# Patient Record
Sex: Male | Born: 1945 | Race: White | Hispanic: No | Smoking: Former smoker
Health system: Southern US, Community
[De-identification: ages and names within clinical notes are randomized; demographics above are authoritative.]

## PROBLEM LIST (undated history)

## (undated) DIAGNOSIS — E119 Type 2 diabetes mellitus without complications: Secondary | ICD-10-CM

## (undated) DIAGNOSIS — Z860100 Personal history of colon polyps, unspecified: Secondary | ICD-10-CM

## (undated) DIAGNOSIS — F419 Anxiety disorder, unspecified: Secondary | ICD-10-CM

## (undated) DIAGNOSIS — K2970 Gastritis, unspecified, without bleeding: Secondary | ICD-10-CM

## (undated) DIAGNOSIS — K2990 Gastroduodenitis, unspecified, without bleeding: Secondary | ICD-10-CM

## (undated) DIAGNOSIS — D649 Anemia, unspecified: Secondary | ICD-10-CM

## (undated) DIAGNOSIS — E871 Hypo-osmolality and hyponatremia: Secondary | ICD-10-CM

## (undated) DIAGNOSIS — K769 Liver disease, unspecified: Secondary | ICD-10-CM

## (undated) DIAGNOSIS — F32A Depression, unspecified: Secondary | ICD-10-CM

## (undated) DIAGNOSIS — K219 Gastro-esophageal reflux disease without esophagitis: Secondary | ICD-10-CM

## (undated) DIAGNOSIS — F329 Major depressive disorder, single episode, unspecified: Secondary | ICD-10-CM

## (undated) DIAGNOSIS — R739 Hyperglycemia, unspecified: Secondary | ICD-10-CM

## (undated) DIAGNOSIS — I10 Essential (primary) hypertension: Secondary | ICD-10-CM

## (undated) DIAGNOSIS — K589 Irritable bowel syndrome without diarrhea: Secondary | ICD-10-CM

## (undated) DIAGNOSIS — E785 Hyperlipidemia, unspecified: Secondary | ICD-10-CM

## (undated) DIAGNOSIS — M129 Arthropathy, unspecified: Secondary | ICD-10-CM

## (undated) HISTORY — DX: Major depressive disorder, single episode, unspecified: F32.9

## (undated) HISTORY — DX: Hyperglycemia, unspecified: R73.9

## (undated) HISTORY — DX: Hyperlipidemia, unspecified: E78.5

## (undated) HISTORY — DX: Hypo-osmolality and hyponatremia: E87.1

## (undated) HISTORY — PX: FRACTURE SURGERY: SHX138

## (undated) HISTORY — DX: Anxiety disorder, unspecified: F41.9

## (undated) HISTORY — DX: Gastroduodenitis, unspecified, without bleeding: K29.90

## (undated) HISTORY — DX: Arthropathy, unspecified: M12.9

## (undated) HISTORY — DX: Personal history of colonic polyps: Z86.010

## (undated) HISTORY — DX: Gastro-esophageal reflux disease without esophagitis: K21.9

## (undated) HISTORY — DX: Anemia, unspecified: D64.9

## (undated) HISTORY — DX: Irritable bowel syndrome, unspecified: K58.9

## (undated) HISTORY — DX: Type 2 diabetes mellitus without complications: E11.9

## (undated) HISTORY — DX: Personal history of colon polyps, unspecified: Z86.0100

## (undated) HISTORY — DX: Gastritis, unspecified, without bleeding: K29.70

## (undated) HISTORY — DX: Depression, unspecified: F32.A

## (undated) HISTORY — DX: Essential (primary) hypertension: I10

---
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
## 1999-04-16 ENCOUNTER — Emergency Department (HOSPITAL_COMMUNITY): Admission: EM | Admit: 1999-04-16 | Discharge: 1999-04-16 | Payer: Self-pay | Admitting: Emergency Medicine

## 2001-05-06 ENCOUNTER — Emergency Department (HOSPITAL_COMMUNITY): Admission: EM | Admit: 2001-05-06 | Discharge: 2001-05-06 | Payer: Self-pay | Admitting: *Deleted

## 2005-05-17 ENCOUNTER — Emergency Department (HOSPITAL_COMMUNITY): Admission: EM | Admit: 2005-05-17 | Discharge: 2005-05-17 | Payer: Self-pay | Admitting: Emergency Medicine

## 2007-10-25 ENCOUNTER — Emergency Department (HOSPITAL_COMMUNITY): Admission: EM | Admit: 2007-10-25 | Discharge: 2007-10-25 | Payer: Self-pay | Admitting: Emergency Medicine

## 2007-11-04 ENCOUNTER — Inpatient Hospital Stay (HOSPITAL_COMMUNITY): Admission: EM | Admit: 2007-11-04 | Discharge: 2007-11-15 | Payer: Self-pay | Admitting: Family Medicine

## 2008-02-29 ENCOUNTER — Emergency Department (HOSPITAL_COMMUNITY): Admission: EM | Admit: 2008-02-29 | Discharge: 2008-03-01 | Payer: Self-pay | Admitting: Emergency Medicine

## 2008-03-04 ENCOUNTER — Emergency Department (HOSPITAL_COMMUNITY): Admission: EM | Admit: 2008-03-04 | Discharge: 2008-03-04 | Payer: Self-pay | Admitting: Emergency Medicine

## 2009-09-25 ENCOUNTER — Emergency Department (HOSPITAL_COMMUNITY): Admission: EM | Admit: 2009-09-25 | Discharge: 2009-09-25 | Payer: Self-pay | Admitting: Family Medicine

## 2009-09-26 ENCOUNTER — Ambulatory Visit: Payer: Self-pay | Admitting: Internal Medicine

## 2009-09-26 ENCOUNTER — Observation Stay (HOSPITAL_COMMUNITY): Admission: EM | Admit: 2009-09-26 | Discharge: 2009-09-29 | Payer: Self-pay | Admitting: Emergency Medicine

## 2009-10-23 ENCOUNTER — Ambulatory Visit (HOSPITAL_COMMUNITY): Admission: RE | Admit: 2009-10-23 | Discharge: 2009-10-23 | Payer: Self-pay | Admitting: Gastroenterology

## 2009-10-23 ENCOUNTER — Encounter (INDEPENDENT_AMBULATORY_CARE_PROVIDER_SITE_OTHER): Payer: Self-pay | Admitting: Gastroenterology

## 2009-12-13 LAB — HM DIABETES EYE EXAM

## 2009-12-13 LAB — HM COLONOSCOPY

## 2011-01-12 ENCOUNTER — Emergency Department (HOSPITAL_COMMUNITY)
Admission: EM | Admit: 2011-01-12 | Discharge: 2011-01-12 | Payer: Self-pay | Source: Home / Self Care | Admitting: Emergency Medicine

## 2011-01-31 ENCOUNTER — Inpatient Hospital Stay (INDEPENDENT_AMBULATORY_CARE_PROVIDER_SITE_OTHER)
Admission: RE | Admit: 2011-01-31 | Discharge: 2011-01-31 | Disposition: A | Discharge status: Home / Self Care | Payer: Medicare Other | Source: Ambulatory Visit | Attending: Emergency Medicine | Admitting: Emergency Medicine

## 2011-01-31 DIAGNOSIS — S0990XA Unspecified injury of head, initial encounter: Secondary | ICD-10-CM

## 2011-03-17 LAB — GLUCOSE, CAPILLARY
Glucose-Capillary: 167 mg/dL — ABNORMAL HIGH (ref 70–99)
Glucose-Capillary: 223 mg/dL — ABNORMAL HIGH (ref 70–99)

## 2011-03-18 LAB — DIFFERENTIAL
Eosinophils Relative: 2 % (ref 0–5)
Lymphocytes Relative: 30 % (ref 12–46)
Lymphs Abs: 1 10*3/uL (ref 0.7–4.0)
Monocytes Absolute: 0.3 10*3/uL (ref 0.1–1.0)
Monocytes Relative: 8 % (ref 3–12)

## 2011-03-18 LAB — BASIC METABOLIC PANEL
BUN: 16 mg/dL (ref 6–23)
BUN: 17 mg/dL (ref 6–23)
CO2: 25 mEq/L (ref 19–32)
Calcium: 8.7 mg/dL (ref 8.4–10.5)
Chloride: 103 mEq/L (ref 96–112)
Chloride: 104 mEq/L (ref 96–112)
GFR calc Af Amer: >60 mL/min (ref >60)
Glucose, Bld: 136 mg/dL — ABNORMAL HIGH (ref 70–99)
Glucose, Bld: 173 mg/dL — ABNORMAL HIGH (ref 70–99)
Potassium: 3.9 mEq/L (ref 3.5–5.1)
Sodium: 139 mEq/L (ref 135–145)

## 2011-03-18 LAB — GLUCOSE, CAPILLARY
Glucose-Capillary: 137 mg/dL — ABNORMAL HIGH (ref 70–99)
Glucose-Capillary: 159 mg/dL — ABNORMAL HIGH (ref 70–99)
Glucose-Capillary: 93 mg/dL (ref 70–99)
Glucose-Capillary: 131 mg/dL — ABNORMAL HIGH (ref 70–99)
Glucose-Capillary: 77 mg/dL (ref 70–99)

## 2011-03-18 LAB — CBC
HCT: 37.5 % — ABNORMAL LOW (ref 39.0–52.0)
HCT: 36.8 % — ABNORMAL LOW (ref 39.0–52.0)
Hemoglobin: 12.9 g/dL — ABNORMAL LOW (ref 13.0–17.0)
Hemoglobin: 12.3 g/dL — ABNORMAL LOW (ref 13.0–17.0)
Hemoglobin: 12.7 g/dL — ABNORMAL LOW (ref 13.0–17.0)
MCHC: 34.5 g/dL (ref 30.0–36.0)
MCV: 92.8 fL (ref 78.0–100.0)
MCV: 92.6 fL (ref 78.0–100.0)
Platelets: 68 10*3/uL — ABNORMAL LOW (ref 150–400)
Platelets: 73 10*3/uL — ABNORMAL LOW (ref 150–400)
RBC: 4.04 MIL/uL — ABNORMAL LOW (ref 4.22–5.81)
RBC: 3.84 MIL/uL — ABNORMAL LOW (ref 4.22–5.81)
RDW: 13.6 % (ref 11.5–15.5)
RDW: 13.3 % (ref 11.5–15.5)
WBC: 5.5 10*3/uL (ref 4.0–10.5)
WBC: 4.5 10*3/uL (ref 4.0–10.5)

## 2011-03-18 LAB — CARDIAC PANEL(CRET KIN+CKTOT+MB+TROPI)
CK, MB: 1 ng/mL (ref 0.3–4.0)
Relative Index: INVALID (ref 0.0–2.5)
Total CK: 79 U/L (ref 7–232)
Troponin I: 0.01 ng/mL (ref 0.00–0.06)
Troponin I: 0.01 ng/mL (ref 0.00–0.06)

## 2011-03-18 LAB — HEPATIC FUNCTION PANEL
ALT: 20 U/L (ref 0–53)
AST: 32 U/L (ref 0–37)
Alkaline Phosphatase: 51 U/L (ref 39–117)
Indirect Bilirubin: 0.5 mg/dL (ref 0.3–0.9)
Total Protein: 7.6 g/dL (ref 6.0–8.3)

## 2011-03-18 LAB — POCT CARDIAC MARKERS
CKMB, poc: <1 ng/mL — ABNORMAL LOW (ref 1.0–8.0)
CKMB, poc: <1 ng/mL — ABNORMAL LOW (ref 1.0–8.0)
Myoglobin, poc: 82.8 ng/mL (ref 12–200)
Troponin i, poc: <0.05 ng/mL (ref 0.00–0.09)

## 2011-03-18 LAB — RETICULOCYTES: Retic Count, Absolute: 41.3 10*3/uL (ref 19.0–186.0)

## 2011-03-18 LAB — CARBAMAZEPINE, FREE AND TOTAL

## 2011-04-27 NOTE — Discharge Summary (Signed)
NAME:  [name], [name]               ACCOUNT NO.:  [id number]   MEDICAL RECORD NO.:  [id number]          PATIENT TYPE:  INP   LOCATION:  6743                         FACILITY:  [facility]   PHYSICIAN:  Hind I Elsaid, MD      DATE OF BIRTH:  [birth year]   DATE OF ADMISSION:  11/04/2007  DATE OF DISCHARGE:  11/14/2007                               DISCHARGE SUMMARY   DISCHARGE DIAGNOSES:  Discharge diagnoses remain the same as dictated by  Dr. Jamison Oka on November 08, 2007.   DISCHARGE MEDICATIONS:  1. Protonix 40 mg p.o. daily.  2. Remeron 15 mg p.o. q.h.s.  3. Zocor 40 mg p.o. q.h.s.  4. Aspirin 81 mg p.o. daily.  5. Lisinopril 5 mg p.o. daily.  6. Geodon 80 mg p.o. daily.  7. Cipro 500 mg p.o. q.12h. for another 2 weeks.  8. Metformin 1000 mg p.o. b.i.d.  9. Novolin insulin NPH, 22 units in the morning and 17 units at      bedtime.  10.Darvocet 1-2 tablets p.o. q.4-6h. p.r.n. for pain.  11.Hydroxyzine 50 mg p.o. daily.  12.Ativan 0.5 mg p.o. q.8h. p.r.n.  13.Wound dressing which is mainly wound care which involves packing      and application of the wound with sterile water 2 times a day.      Please replace packing using 1-inch idoform gauze.  Reapply dry      gauze over the wound and use a support garment.   HOSPITAL COURSE:  Covering December 1 and November 14, 2007.  The patient  remained afebrile.  Wound remained sterile, showing decreased erythema  and discharge.  Plan is for this patient to continue Cipro p.o. for a  total of 3 weeks.  The patient has to follow up with Dr. Marcelyn Bruins  from [facility] within 1 week for further evaluation of the  scrotum abscess.  The patient may need further imaging study after  resolution of infection.  Further recommendations regarding followup of  the epididymis and the testicle depend on the outpatient followup with  Dr. Marcelyn Bruins.   Also during hospitalization, the patient developed an episode of  hypoglycemia.  The  patient's Novolin NPH was decreased to 22 units subcu  in the morning and 17 units at evening.  Also, glipizide was stopped.  Further recommendations to increase his hyperglycemic agents to be  addressed as an outpatient.      Hind Bosie Helper, MD  Electronically Signed     HIE/MEDQ  D:  11/14/2007  T:  11/14/2007  Job:  [id number]

## 2011-04-27 NOTE — Discharge Summary (Signed)
NAME:  [name], [name]               ACCOUNT NO.:  [id number]   MEDICAL RECORD NO.:  [id number]          PATIENT TYPE:  INP   LOCATION:  6743                         FACILITY:  [facility]   PHYSICIAN:  Mobolaji B. Bakare, M.D.DATE OF BIRTH:  September 13, 1946   DATE OF ADMISSION:  11/04/2007  DATE OF DISCHARGE:                               DISCHARGE SUMMARY   INTERIM DISCHARGE SUMMARY:   PRIMARY CARE PHYSICIAN:  Unassigned.  The patient attends [employer] in  [place].   DATE OF DISCHARGE:  Pending.   FINAL DIAGNOSES:  1. Scrotal cellulitis and abscess.  2. Uncontrolled diabetes mellitus.  3. Normocytic anemia.  4. Mild thrombocytopenia.  5. Post-traumatic stress disorder.  6. Hypertension.  7. Hyperlipidemia.  8. Irritable bowel syndrome.  9. Anxiety.   PROCEDURES:  1. Ultrasound of the scrotum done on November 06, 2007 showed abnormal      density within the right scrotum raising the question of deformed      right testicle with innumerable microcalcifications.  Malignancy      could not be excluded.  The left testicle is normal.  There was      suspicion for left epididymitis.  2. CT scan of the pelvis with contrast showed scrotal swelling and      inhomogeneity suggesting cellulitis or inflammatory change.  3. Chest x-ray post PICC line insertion showed appropriate placement      in the mid SVC.   CONSULTANTS:  Urology consult provided by Jamison Neighbor, M.D.   BRIEF HISTORY:  Please refer to the admission H&P.  In brief, [name]  is a 65 year old Caucasian male with history of diabetes mellitus and  PTSD who lives independently at home.  He presented on the day of  admission with pain in the scrotum.  It was associated with fever,  chills, nausea, and diarrhea.  Prior to hospitalization, the patient was  seen at the urgent care and placed on oral antibiotic.  He also reported  drainage of pus from the left scrotum 4 days prior to hospitalization.  On initial evaluation, there was  erythema involving the whole scrotum.  There was a black eschar at the bottom of the scrotum.  No appreciable  collection.  The patient was admitted with scrotal cellulitis with  possible abscess.  The patient was admitted for further treatment and  evaluation.  He was started on broad-spectrum antibiotics with  vancomycin, ciprofloxacin, and gentamicin.   HOSPITAL COURSE:  Problem1.  Scrotal cellulitis and abscess.  The  patient was placed on IV broad-spectrum antibiotics with vancomycin,  gentamicin, and Cipro to cover MRSA and gram-negative organisms.  CT  scan of the scrotum was ordered.  Results are as noted above.  The CT  was unhelpful, hence ultrasound of the scrotum was obtained.  This  indicated abnormal density within the right scrotum raising the question  of deformed right testicle with enumerable calcifications.  Malignancy  could not be excluded.  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  Urology was consulted.  The patient  underwent an incision and  drainage by the bedside through the area of  dark eschar.  About 200 mL of green pus was obtained, resulting in a  large open abscess cavity.  There was no culture obtained from this pus.  Hence, the patient was continued on empiric treatment with antibiotics.  He has completed a 5-day course of triple antibiotics, as mentioned  above.  Blood culture came back negative.  Hence, vancomycin and  gentamicin were discontinued.  He was continued on ciprofloxacin p.o.  The patient remained afebrile.  White cell count has normalized.  He is  receiving hydrotherapy and wound dressing.  We will continue antibiotics  to complete ciprofloxacin for a total of 2 weeks.  With regards to the  irregularity on ultrasound, the patient will need a followup ultrasound  after the infection settles.  He will need to follow up with Dr. Logan Bores  in the outpatient setting.   Problem 2.  Diabetes mellitus.  The  patient has type 2 diabetes.  He has  been on metformin, glipizide, and NovoLog N.  During the initial course  of hospitalization, blood glucose was uncontrolled.  He was restarted on  his home dose of metformin, glipizide and NovoLog N and also placed on  sliding scale insulin for correctional coverage.  Hemoglobin A1c was  12.4, indicating poor control previously.  The patient's blood glucose  on this current regimen has been fairly controlled, with fasting blood  glucose on the day of dictation at 104.  Further adjustment will be made  depending on response to current medications.   Problem 3.  Normocytic anemia.  The patient was admitted with a  hemoglobin of 12.7, and this dropped to about 10.  Anemia panel was  suggestive of anemia of chronic disease.  This again may be  hemodilutional.  Currently, hemoglobin is ranging between 10 and 11.  Stool Hemoccult is negative.   Problem 4.  Mild thrombocytopenia.  The patient was admitted with a  platelet of 128.  This is felt to be secondary to the infection.  Platelets at the time of dictation were 144.   DISPOSITION:  The patient's family stated that he has been neglecting  himself, and it appears he that he cannot take care of his medications  appropriately at home.  Arrangement is underway for an assisted living  facility.   Addendum will be made to this dictation with discharge medications and  instructions.      Mobolaji B. Corky Downs, M.D.  Electronically Signed     MBB/MEDQ  D:  11/10/2007  T:  11/10/2007  Job:  [id number]   cc:   Jamison Neighbor, M.D.

## 2011-04-27 NOTE — Consult Note (Signed)
[name], [name]               ACCOUNT NO.:  [id number]   MEDICAL RECORD NO.:  [id number]          PATIENT TYPE:  INP   LOCATION:  6743                         FACILITY:  [facility]   PHYSICIAN:  Jamison Neighbor, M.D.  DATE OF BIRTH:  [birth year]   DATE OF CONSULTATION:  DATE OF DISCHARGE:                                 CONSULTATION   SERVICE:  Urology.   CONSULTING PHYSICIAN:  Dr. Lavera Guise   REASON FOR CONSULTATION:  1. Scrotal abscess.  2. Left epididymal orchitis.  3. Irregular right testicle.   HISTORY:  This is a 65 year old white male who was admitted to the  hospital on November 04, 2007, for treatment of what was described as  pain in the scrotum.  The patient is normally followed at the [facility] in [place] but presented 2 days prior to admission to urgent  care and was given oral antibiotic therapy.  At the time of his  admission the patient said that he had been having problem for about a  week, but when I pressed he said that he had been having some problems  down the left side for several months.  The patient did not respond to  antibiotic therapy.  His pain worsened.  He began to develop fevers,  chills riders and nausea.  He presented the emergency room.  He had some  problems with pus draining from the scrotum but he stated that the  drainage had stopped.  He ended up being admitted for treatment of what  appeared to be scrotal abscess.   While in the hospital the patient had a CT scan which suggested that he  had a right testicular remnant.  The patient claims that his right  testicle had been removed but on the imaging studies it looks as if  there was a right testicle present.  It had significant irregularity  with calcifications noted.  They question whether perhaps the patient's  testicle was still intact but that he had an epididymectomy.  The left  testicle itself appeared normal but the epididymis is enlarged.  The  patient has been started on a  combination of gentamicin, Cipro and  vancomycin.  The CT scan does not suggest that there is any gas within  the wound so does not appear to be Fournier's gangrene.  Urologic  consultation is sought to determine appropriate treatment for this  patient.   PAST MEDICAL HISTORY:  Remarkable for diabetes, hypertension and  elevated cholesterol.  He has a distant history of irritable bowel  syndrome and suffers from post-traumatic stress disorder.   SURGICAL HISTORY:  Is a little complicated in that he does think he had  an orchiectomy but based on imaging studies it was suggested that is not  the case.   MEDICATIONS:  At the time of admission, included aspirin, Flexeril,  gemfibrozil, glipizide, hydroxyzine, insulin, lisinopril, lorazepam,  metformin, minocycline, Remeron, simvastatin, and Geodon.   Review of the patient's current medications note that he is on Protonix,  Remeron, insulin, Geodon, as well as the antibiotic therapy described  above.   FAMILY  HISTORY, SOCIAL HISTORY, AND REVIEW OF SYMPTOMS:  Are not  contributory.  We did specifically question if he had had any GI  problems.  He said he had had a little blood in his stool and had some  diarrhea.  He denies any previous urologic history or previous  instrumentation.   EXAMINATION:  The patient is a well-developed, well-nourished, and  friendly male.  He says that he had been having some bloody drainage but  no pus drainage from the area.  HEENT exam was pertinent for a rash but  otherwise was unremarkable.  He had no flank mass or tenderness.  The  abdomen is soft, nontender with no palpable masses, rebound or guarding.  On GU exam I saw black eschar at the base of the scrotum and I was able  to lift this off and drain out 200-250 mL of copious green pus.  I then  was able to explore that area and there is a significant abscess cavity.  It does not appear to track back towards the rectum so I do not think  there is a  rectal source and I could not palpate any irregularities  along the course of the urethra.  He has a Foley catheter in place.  The  cavity does not really extend up to the right testicle and left testicle  was really nonpalpable because of the swelling.   I have reviewed the imaging studies.  The CT scan that is noted above  does show that there is no evidence of gas within the wound.  The  patient has no evidence of adenopathy or free fluid and the CT scan was  otherwise normal.  The ultrasound study as noted above shows an abnormal  density in the right scrotum suggesting he has a somewhat deformed right  testicle and numerous microcalcifications were identified.  The  radiologist said they could rule out malignancy, but of course at age 76  malignancy is relatively uncommon.  We do note, however, that in the age  group of 45-70 there is an increase in spermatocytic seminoma so that  would have to be considered.  The left testicle itself was normal but  the patient has clear-cut left epididymitis as well as the abscess.   RECOMMENDATIONS:  1. Continue the current antibiotic regimen, it appears to be      excellent.  2. The patient needs to have saline wet-to-dry packing of the wound      and it needs to be a very deep and vigorous packing three times a      day.  3. The patient should undergo whirlpool treatments once daily.  4. After the patient has stopped draining pus and the wound is      stabilized he can go home on oral antibiotics.  He is likely to      require these for quite some time.  I would anticipate 3-4 weeks of      antibiotic therapy would be appropriate.  5. Following completion of antibiotic therapy and normalization, I      would like to repeat his imaging studies.  In the left epididymis      has resolved and that left testicle looks fine I will leave that      alone.  On the right-hand side, if problems are still noted in that      right testicle, the best  treatment certainly might be to remove      this.  The  patient would need to have this done with an inguinal      incision and I would not want to do that at this time until the      abscess has been controlled as I would have fear that the infection      with spread up into that inguinal incision.   We will follow this patient during his hospital stay.      Jamison Neighbor, M.D.  Electronically Signed     RJE/MEDQ  D:  11/08/2007  T:  11/08/2007  Job:  [id number]

## 2011-04-27 NOTE — H&P (Signed)
NAMEJOHNTAVIOUS, FRANCOM               ACCOUNT NO.:  [id number]   MEDICAL RECORD NO.:  [id number]          PATIENT TYPE:  INP   LOCATION:  6743                         FACILITY:  [facility]   PHYSICIAN:  Lonia Blood, M.D.       DATE OF BIRTH:  [birth year]   DATE OF ADMISSION:  11/04/2007  DATE OF DISCHARGE:                              HISTORY & PHYSICAL   PRIMARY CARE PHYSICIAN:  [facility] in [place].   CHIEF COMPLAINT:  Pain in the scrotum.   HISTORY OF PRESENT ILLNESS:  Mr. Medlen is a 65 year old gentleman who  started experiencing about a week ago pain in his scrotum.  He presented  2 days prior to admission to the urgent care and was placed on oral  antibiotics.  His pain worsened and he started having fevers, chills,  nausea, and diarrhea, and presented back to the emergency room.  The  patient also reports that about 4 days prior to admission he had copious  pus draining from his scrotum, but that now the drainage has stopped.  He reports that also he is status post remote orchiectomy.   PAST MEDICAL HISTORY:  1. Diabetes.  2. Hypertension.  3. Hyperlipidemia.  4. Anxiety.  5. Post traumatic stress disorder.  6. Irritable bowel syndrome.  7. Hemorrhoids.   FAMILY HISTORY:  Both of the patient's parents are deceased.  He has a  sister who is alive and healthy.  He does not have any living children.   SOCIAL HISTORY:  The patient lives alone.  He denies smoking cigarettes.  Does not drink alcohol.   ALLERGIES:  PENICILLIN.   HOME MEDICATIONS:  Aspirin, Flexeril, gemfibrozil, glipizide,  hydroxyzine, insulin MPH 22 units in the morning and 17 units in the  evening, lisinopril, lorazepam, metformin, minocycline, Remeron,  simvastatin, and Geodon.  Unfortunately, the patient does not know the  doses of these medications.   REVIEW OF SYSTEMS:  Positive for diarrhea.  Positive for a rash on the  face that appeared 2 weeks prior to admission.  Positive for  some blood  in the stool.  The patient was trying to get evaluated through the [facility].   PHYSICAL EXAMINATION UPON ADMISSION:  VITAL SIGNS:  Shows a temperature  of temperature of 99.5, pulse of 119, respirations 12, blood pressure is  152/87, saturation is 99% on room air.  GENERAL APPEARANCE:  An anxious alert gentleman in some moderate  distress due to pain.  He is lying on the stretcher.  He is oriented to  place, person, and time.  HEAD:  His face has a macular rash that is widespread on the face.  No  declamation and no vesicular formation.  EYES:  Pupils equal and round, reactive to light and accommodation.  Extraocular movements intact.  THROAT:  Clear.  NECK:  Supple.  No JVD.  CHEST:  Clear to auscultation bilaterally without wheezes, rhonchi, or  crackles.  ABDOMEN:  Soft.  There is some mild suprapubic tenderness and I can  palpate a little bit of a mass suprapubically.  Bowel sounds are  present.  GENITOURINARY EXAM:  Shows an erythema on the scrotum.  There is a black  eschar at the bottom of the scrotum.  No appreciable collection.  EXTREMITIES:  Have no edema.  There are no changes on the skin on both  thighs bilaterally.  NEUROLOGICAL EXAM:  Nonfocal.   LABORATORY EVALUATION ON ADMISSION:  White blood cell count is 6.9,  hemoglobin 12.7, platelet count is 128,000.  Sodium is 128, potassium  3.3, chloride 94, bicarbonate 23, BUN 10, creatinine 1, glucose 389,  albumin is 2.7.   IMPRESSION:  1. Scrotal abscess:  This well could be methicillin-resistant      Staphylococcus aureus given also the patient's concomitant rash and      diarrhea which would suggest like a toxin producing strain.  I      would admit the patient to the acute care unit.  Obtain a CT scan      of the scrotum to rule out transaminases.  Treat the patient with      intravenous vancomycin, intravenous ciprofloxacin, intravenous      gentamicin given the fact that he is penicillin allergic.       Neurological consultation has been obtained through the emergency      room physician.  2. Uncontrolled diabetes type 2:  Mr. Eoff will be placed on a      diabetic diet.  He will have insulin NPH twice a day as well as      sliding scale insulin with meals.  Further adjustments will be done      based on the 24-hour CBG measurements.  3. Anemia with gastrointestinal blood loss:  Mr. Hausman will be      observed off aspirin for now.  He will be seen in consultation by      gastroenterology when his problem #1 resolves.  4. Hypokalemia:  Unclear etiology.  The patient is on a lot of      medications.  He may have forgotten to mention a diuretic.  The      potassium will be repleted intravenously.  5. Protein calorie gram nutrition:  Protein supplements orally will be      given to the patient.      Lonia Blood, M.D.  Electronically Signed     SL/MEDQ  D:  11/04/2007  T:  11/05/2007  Job:  [id number]   cc:   Renne Musca

## 2011-07-06 ENCOUNTER — Inpatient Hospital Stay (HOSPITAL_COMMUNITY)
Admission: EM | Admit: 2011-07-06 | Discharge: 2011-07-12 | DRG: 571 | Disposition: A | Discharge status: Home Health | Payer: Medicare Other | Attending: Internal Medicine | Admitting: Internal Medicine

## 2011-07-06 ENCOUNTER — Inpatient Hospital Stay (INDEPENDENT_AMBULATORY_CARE_PROVIDER_SITE_OTHER)
Admission: RE | Admit: 2011-07-06 | Discharge: 2011-07-06 | Disposition: A | Discharge status: Home / Self Care | Payer: Medicare Other | Source: Ambulatory Visit | Attending: Emergency Medicine | Admitting: Emergency Medicine

## 2011-07-06 DIAGNOSIS — K589 Irritable bowel syndrome without diarrhea: Secondary | ICD-10-CM | POA: Diagnosis present

## 2011-07-06 DIAGNOSIS — L03221 Cellulitis of neck: Secondary | ICD-10-CM

## 2011-07-06 DIAGNOSIS — IMO0001 Reserved for inherently not codable concepts without codable children: Secondary | ICD-10-CM | POA: Diagnosis present

## 2011-07-06 DIAGNOSIS — E871 Hypo-osmolality and hyponatremia: Secondary | ICD-10-CM | POA: Diagnosis present

## 2011-07-06 DIAGNOSIS — Z79899 Other long term (current) drug therapy: Secondary | ICD-10-CM

## 2011-07-06 DIAGNOSIS — D649 Anemia, unspecified: Secondary | ICD-10-CM | POA: Diagnosis present

## 2011-07-06 DIAGNOSIS — I10 Essential (primary) hypertension: Secondary | ICD-10-CM | POA: Diagnosis present

## 2011-07-06 DIAGNOSIS — Z7982 Long term (current) use of aspirin: Secondary | ICD-10-CM

## 2011-07-06 DIAGNOSIS — D72829 Elevated white blood cell count, unspecified: Secondary | ICD-10-CM | POA: Diagnosis present

## 2011-07-06 DIAGNOSIS — L0211 Cutaneous abscess of neck: Secondary | ICD-10-CM

## 2011-07-06 DIAGNOSIS — A4901 Methicillin susceptible Staphylococcus aureus infection, unspecified site: Secondary | ICD-10-CM | POA: Diagnosis present

## 2011-07-06 DIAGNOSIS — L732 Hidradenitis suppurativa: Secondary | ICD-10-CM | POA: Diagnosis present

## 2011-07-06 DIAGNOSIS — F411 Generalized anxiety disorder: Secondary | ICD-10-CM | POA: Diagnosis present

## 2011-07-06 DIAGNOSIS — E785 Hyperlipidemia, unspecified: Secondary | ICD-10-CM | POA: Diagnosis present

## 2011-07-06 DIAGNOSIS — B999 Unspecified infectious disease: Secondary | ICD-10-CM

## 2011-07-06 LAB — GLUCOSE, CAPILLARY: Glucose-Capillary: 397 mg/dL — ABNORMAL HIGH (ref 70–99)

## 2011-07-07 LAB — GLUCOSE, CAPILLARY
Glucose-Capillary: 339 mg/dL — ABNORMAL HIGH (ref 70–99)
Glucose-Capillary: 311 mg/dL — ABNORMAL HIGH (ref 70–99)

## 2011-07-07 LAB — BASIC METABOLIC PANEL
BUN: 19 mg/dL (ref 6–23)
CO2: 25 mEq/L (ref 19–32)
CO2: 26 mEq/L (ref 19–32)
Calcium: 8.4 mg/dL (ref 8.4–10.5)
Chloride: 90 mEq/L — ABNORMAL LOW (ref 96–112)
Chloride: 93 mEq/L — ABNORMAL LOW (ref 96–112)
Creatinine, Ser: 0.88 mg/dL (ref 0.50–1.35)
Creatinine, Ser: 0.8 mg/dL (ref 0.50–1.35)
GFR calc Af Amer: >60 mL/min (ref >60)
GFR calc Af Amer: >60 mL/min (ref >60)
GFR calc non Af Amer: >60 mL/min (ref >60)
Glucose, Bld: 361 mg/dL — ABNORMAL HIGH (ref 70–99)
Glucose, Bld: 328 mg/dL — ABNORMAL HIGH (ref 70–99)
Potassium: 4.5 mEq/L (ref 3.5–5.1)
Potassium: 4.2 mEq/L (ref 3.5–5.1)
Sodium: 125 mEq/L — ABNORMAL LOW (ref 135–145)
Sodium: 126 mEq/L — ABNORMAL LOW (ref 135–145)

## 2011-07-07 LAB — CBC
HCT: 32 % — ABNORMAL LOW (ref 39.0–52.0)
HCT: 35.4 % — ABNORMAL LOW (ref 39.0–52.0)
Hemoglobin: 12 g/dL — ABNORMAL LOW (ref 13.0–17.0)
MCHC: 33.9 g/dL (ref 30.0–36.0)
MCV: 85.6 fL (ref 78.0–100.0)
MCV: 86 fL (ref 78.0–100.0)
Platelets: 90 10*3/uL — ABNORMAL LOW (ref 150–400)
Platelets: 89 10*3/uL — ABNORMAL LOW (ref 150–400)
RBC: 3.74 MIL/uL — ABNORMAL LOW (ref 4.22–5.81)
RBC: 3.64 MIL/uL — ABNORMAL LOW (ref 4.22–5.81)
RDW: 13.4 % (ref 11.5–15.5)
RDW: 13.6 % (ref 11.5–15.5)
WBC: 11.7 10*3/uL — ABNORMAL HIGH (ref 4.0–10.5)
WBC: 11.7 10*3/uL — ABNORMAL HIGH (ref 4.0–10.5)
WBC: 9.9 10*3/uL (ref 4.0–10.5)

## 2011-07-07 LAB — HEMOGLOBIN A1C
Hgb A1c MFr Bld: 10.5 % — ABNORMAL HIGH (ref <5.7)
Mean Plasma Glucose: 246 mg/dL — ABNORMAL HIGH (ref <117)
Mean Plasma Glucose: 255 mg/dL — ABNORMAL HIGH (ref <117)

## 2011-07-07 LAB — TYPE AND SCREEN: ABO/RH(D): O POS

## 2011-07-07 LAB — DIFFERENTIAL
Basophils Relative: 0 % (ref 0–1)
Eosinophils Relative: 1 % (ref 0–5)
Lymphocytes Relative: 7 % — ABNORMAL LOW (ref 12–46)
Monocytes Absolute: 0.9 10*3/uL (ref 0.1–1.0)
Monocytes Relative: 8 % (ref 3–12)
Neutrophils Relative %: 84 % — ABNORMAL HIGH (ref 43–77)
WBC Morphology: INCREASED

## 2011-07-07 LAB — APTT: aPTT: 33 seconds (ref 24–37)

## 2011-07-07 LAB — MRSA PCR SCREENING: MRSA by PCR: POSITIVE — AB

## 2011-07-07 LAB — PROTIME-INR
INR: 1.39 (ref 0.00–1.49)
Prothrombin Time: 17.3 seconds — ABNORMAL HIGH (ref 11.6–15.2)

## 2011-07-07 LAB — ABO/RH: ABO/RH(D): O POS

## 2011-07-07 NOTE — H&P (Signed)
  NAME:  [name], [name]               ACCOUNT NO.:  [id number]  MEDICAL RECORD NO.:  [id number]  LOCATION:  [facility]                         FACILITY:  [facility]  PHYSICIAN:  Eduard Clos, MDDATE OF BIRTH:  September 22, 1946  DATE OF ADMISSION:  07/06/2011 DATE OF DISCHARGE:                             HISTORY & PHYSICAL   ADDENDUM:  PRIMARY CARE PHYSICIAN:  Florentina Jenny, MD  In addition the patient also has hyponatremia which I think at this time could be from high blood sugar and also mild dehydration for which the patient is getting fluids.  We are going to closely follow his BMET.  I am going to recheck BMET over the night at least twice to ensure there is no further decreasing and also check a urine sodium and osmolality along with TSH.     Eduard Clos, MD     ANK/MEDQ  D:  07/06/2011  T:  07/06/2011  Job:  [id number]  cc:   Florentina Jenny, MD  Electronically Signed by Midge Minium MD on 07/07/2011 01:21:50 AM

## 2011-07-07 NOTE — H&P (Signed)
NAMETANNAR, [name]               ACCOUNT NO.:  [id number]  MEDICAL RECORD NO.:  [id number]  LOCATION:  [facility]                         FACILITY:  [facility]  PHYSICIAN:  Eduard Clos, MDDATE OF BIRTH:  1946-06-05  DATE OF ADMISSION:  07/06/2011 DATE OF DISCHARGE:                             HISTORY & PHYSICAL   PRIMARY CARE PHYSICIAN:  Florentina Jenny, MD  CHIEF COMPLAINT:  Swelling and erythema over the back of the neck.  HISTORY OF PRESENT ILLNESS:  This 65 year old male with known history of diabetes mellitus type 2, hypertension has been experiencing some pustular lesion on his face, back of his neck, on the body 3 weeks ago. They initially thought it could be a change of his soaps, and initially, he was placed on topical cream, I do not exactly what the cream's name was.  Eventually, these lesions did not get better, was placed on prednisone tapering dose last Friday that is 4 days ago.  The patient's lesion in the back of the neck got bigger and an abscess like formation, at which point his primary care did an I and D and was placed on Cipro. Today, his sister who was back in town saw the patient early in the morning and saw the lesion.  By the time she saw again in the afternoon, the lesion has become big and purplish with increasing drainage.  The patient was brought to the ER.  At this time, the patient has been admitted for cellulitis and abscess of the neck.  The patient has already been started on vancomycin.  The patient has mild nausea, denies any vomiting.  Denies any abdominal pain, dysuria, or discharge.  Denies any chest pain or shortness of breath.  Denies any dizziness, loss of consciousness, or any focal deficits.  The patient is able to extend the neck but has some pain. Denies any difficulty swallowing.  The patient is at this time eating his dinner.  PAST MEDICAL HISTORY: 1. Diabetes mellitus, type 2. 2. Hyperlipidemia. 3. Hard-of-hearing.  PAST  SURGICAL HISTORY:  Hernia repair.  MEDICATIONS PRIOR TO ADMISSION: 1. The patient is on prednisone taper dose. 2. Aspirin 81 mg p.o. daily. 3. Ferrous sulfate 325 mg p.o. daily. 4. Januvia 100 mg p.o. daily. 5. Meclizine 25 mg 1 tablet daily. 6. Omeprazole 20 mg daily. 7. Zoloft 75 mg p.o. daily. 8. Glipizide 10 mg p.o. three times daily. 9. Simvastatin 40 mg daily. 10.Imodium p.r.n. 11.NovoLog 70/30 - 40 units subcu twice daily. 12.NovoLog sliding scale. 13.Fenofibrate 160 mg p.o. daily. 14.He is on Cipro which was recently started. 15.Ambien 5 mg p.o. p.r.n. at bedtime for insomnia.  ALLERGIES:  PENICILLIN.  SOCIAL HISTORY:  The patient quit smoking and drinking 30 years ago. Denies any drug abuse.  He is a full code and lives in assisted living facility.  FAMILY HISTORY:  Positive for coronary artery disease and stroke, breast cancer in mom.  REVIEW OF SYSTEMS:  As per the history of presenting illness, nothing else significant.  PHYSICAL EXAMINATION:  GENERAL:  The patient was examined at bedside, not in acute distress. VITAL SIGNS:  Blood pressure 122/70, pulse 70 per minute, temperature 99.1, respirations 18 per  minute, O2 sat 98%. HEENT:  Anicteric.  No pallor.  No facial asymmetry.  Tongue is midline. The patient has no difficulty in protruding the tongue. NECK:  There is no mass or lesion in the anterior part of the neck.  On the posterior aspect of the neck, there is erythematous skin which is indurated at least 10 cm in diameter and 5 cm in width.  The patient is able to extend the neck but has pain in doing so because of the indurated skin.  There is opening in the middle of the lesion which is draining. CHEST:  Bilateral air entry present.  No rhonchi, no crepitation. HEART:  S1 and S2 heard. ABDOMEN:  Soft, nontender.  Bowel sounds heard. CENTRAL NERVOUS SYSTEM:  The patient is alert, awake, and oriented to time, place, and person, moves upper and lower  extremities 5/5. EXTREMITIES:  Peripheral pulses felt.  No edema. SKIN:  There are multiple lesions on his chest which look like pustular lesions.  LABORATORY DATA:  CBC:  WBCs 1.7, hemoglobin is 12, hematocrit is 35.4, platelets 93.  Basic metabolic panel:  Sodium 126, potassium 4.5, chloride 90, carbon dioxide 26, glucose 412, BUN 23, creatinine 0.9, calcium 8.8, anion gap is 10.  ASSESSMENT: 1. Cellulitis and abscess of the neck. 2. Uncontrolled diabetes mellitus, type 2, probably because the     patient is on prednisone. 3. History of hyperlipidemia.  PLAN: 1. At this time, we will admit the patient to medical floor. 2. For his cellulitis and abscess of his neck, at this time I am going     to start the patient on vancomycin, also add Primaxin.  We will get     blood cultures and wound cultures and have consult by Dr. Donell Beers,     surgeon.  We will follow their recommendation. 3. For his uncontrolled diabetes mellitus, type 2, at this time I am     going to place the patient on Lantus 40 units subcutaneously daily     at bedtime with NovoLog moderate sliding scale.  I am going to give     at least 6 units of     NovoLog one dose now.  We will check his hemoglobin A1c.  I am     going to taper off his prednisone in the next 4 days. 4. Further recommendation based on test order, clinical course, and     consult's recommendations.     Eduard Clos, MD     ANK/MEDQ  D:  07/06/2011  T:  07/06/2011  Job:  [id number]  Electronically Signed by Midge Minium MD on 07/07/2011 01:21:56 AM

## 2011-07-08 ENCOUNTER — Other Ambulatory Visit (INDEPENDENT_AMBULATORY_CARE_PROVIDER_SITE_OTHER): Payer: Self-pay | Admitting: General Surgery

## 2011-07-08 LAB — GLUCOSE, CAPILLARY
Glucose-Capillary: 278 mg/dL — ABNORMAL HIGH (ref 70–99)
Glucose-Capillary: 216 mg/dL — ABNORMAL HIGH (ref 70–99)
Glucose-Capillary: 170 mg/dL — ABNORMAL HIGH (ref 70–99)
Glucose-Capillary: 164 mg/dL — ABNORMAL HIGH (ref 70–99)
Glucose-Capillary: 209 mg/dL — ABNORMAL HIGH (ref 70–99)

## 2011-07-08 LAB — BASIC METABOLIC PANEL
BUN: 22 mg/dL (ref 6–23)
Chloride: 103 mEq/L (ref 96–112)
GFR calc Af Amer: >60 mL/min (ref >60)
GFR calc non Af Amer: >60 mL/min (ref >60)
Potassium: 4.1 mEq/L (ref 3.5–5.1)

## 2011-07-09 LAB — BASIC METABOLIC PANEL
BUN: 18 mg/dL (ref 6–23)
CO2: 28 mEq/L (ref 19–32)
Glucose, Bld: 220 mg/dL — ABNORMAL HIGH (ref 70–99)
Potassium: 3.9 mEq/L (ref 3.5–5.1)
Sodium: 138 mEq/L (ref 135–145)

## 2011-07-09 LAB — GLUCOSE, CAPILLARY
Glucose-Capillary: 232 mg/dL — ABNORMAL HIGH (ref 70–99)
Glucose-Capillary: 163 mg/dL — ABNORMAL HIGH (ref 70–99)
Glucose-Capillary: 96 mg/dL (ref 70–99)

## 2011-07-10 LAB — GLUCOSE, CAPILLARY: Glucose-Capillary: 222 mg/dL — ABNORMAL HIGH (ref 70–99)

## 2011-07-10 LAB — CULTURE, ROUTINE-ABSCESS

## 2011-07-11 LAB — GLUCOSE, CAPILLARY
Glucose-Capillary: 123 mg/dL — ABNORMAL HIGH (ref 70–99)
Glucose-Capillary: 175 mg/dL — ABNORMAL HIGH (ref 70–99)
Glucose-Capillary: 190 mg/dL — ABNORMAL HIGH (ref 70–99)

## 2011-07-11 LAB — BASIC METABOLIC PANEL
Chloride: 103 mEq/L (ref 96–112)
Creatinine, Ser: 0.56 mg/dL (ref 0.50–1.35)
GFR calc Af Amer: >60 mL/min (ref >60)
Potassium: 3.8 mEq/L (ref 3.5–5.1)
Sodium: 140 mEq/L (ref 135–145)

## 2011-07-12 LAB — GLUCOSE, CAPILLARY: Glucose-Capillary: 136 mg/dL — ABNORMAL HIGH (ref 70–99)

## 2011-07-13 LAB — CULTURE, BLOOD (ROUTINE X 2)
Culture  Setup Time: 201207250302
Culture  Setup Time: 201207250302
Culture: NO GROWTH

## 2011-07-16 NOTE — Discharge Summary (Signed)
[name], [name]               ACCOUNT NO.:  [id number]  MEDICAL RECORD NO.:  [id number]  LOCATION:  5033                         FACILITY:  [facility]  PHYSICIAN:  Calvert Cantor, M.D.     DATE OF BIRTH:  20-Sep-1946  DATE OF ADMISSION:  07/06/2011 DATE OF DISCHARGE:                              DISCHARGE SUMMARY   PRIMARY CARE PHYSICIAN:  Florentina Jenny, MD  PRESENTING COMPLAINT:  Swelling on the back of the neck.  DISCHARGE DIAGNOSES: 1. Large abscess posterior aspect of neck growing methicillin     susceptible staphylococcus aureus resistant to quinolones. 2. Leukocytosis secondary to above. 3. Hyponatremia on admission. 4. Diabetes mellitus type 2 insulin requiring with uncontrolled blood     sugars. 5. Hyperlipidemia. 6. Morbid obesity. 7. Anemia on iron replacement uncertain if this has iron deficiency     was not checked here.  DISCHARGE MEDICATIONS:  New Medications: 1. Acetaminophen 650 mg q. 4 hours as needed. 2. Bactrim DS 1 tablet twice a day for 7 days. 3. Hydrocodone/acetaminophen 5/325 1-2 tablets q. 4 hours as needed. 4. NovoLog/insulin aspart 4 units subcutaneously three times a day     with meals. 5. Lantus/glargine 33 units subcutaneously daily at bedtime. 6. Mupirocin 2% one application twice a day.  Continue the following meds: 1. Ambien 5 mg daily at bedtime. 2. Aspirin 81 mg daily. 3. Colace 100 mg daily at bedtime. 4. Fenofibrate 160 mg daily. 5. Ferrous sulfate 325 mg daily. 6. Januvia 100 mg daily. 7. Maalox 30 mL three times a day for flatulence. 8. Meclizine 25 mg daily as needed for dizziness. 9. Mucinex 600 mg twice a day as needed for cough and congestion. 10.Multivitamin one daily. 11.Polyethylene glycol 17 g daily as needed for constipation. 12.Preparation H ointment one application rectally four times a day as     needed for hemorrhoids. 13.Prilosec 20 mg daily. 14.Sertraline 50 mg 1-1/2 tablets daily. 15.Zocor 40 mg daily at  bedtime.  Stop the following medications: 1. Glipizide 10 mg t.i.d. 2. Novolin R. 3. Loperamide. 4. Ciprofloxacin. 5. Insulin NovoLog 70/30, he was on 40 units twice a day.  CONSULTS:  Surgery consult requested.  The patient was evaluated by Dr. Janee Morn.  PROCEDURES:  Operative report July 08, 2011, incision, drainage, and debridement of posterior neck abscess by Dr. Violeta Gelinas.  HOSPITAL COURSE:  This is a 65 year old male who came in with pain and swelling in the back of his neck.  He had similar lesions on other parts of his body 3 weeks prior.  Initially, it was thought that it was his soap that was causing it.  He was also placed on a topical cream for it. However, it did not improve.  Eventually, his doctor placed him on prednisone.  He had 4 days of prednisone and the swelling worsened into abscess.  At this point, his primary care physician did incision and drainage and placed him on Cipro.  Since it continued to worsen, his sister brought him into the ER.  The patient was noted to have an extremely large abscess on the back of his neck.  WBC count was slightly elevated at 11.7.  The patient was  started on vancomycin and Primaxin.  He underwent an I and D on July 08, 2011.  Large amount of pus was drained.  Two large incisions remained which are being packed.  Cultures from the abscess grew out MSSA.  This was resistant to quinolones.  We continued his vancomycin.  He has been assessed today by Surgery and wound has been healing well and appears clean.  He is being switched over to oral antibiotics.  The patient states that he suspects he is allergic to penicillin and therefore, although Keflex would be our initial choice for treating him we are unable to give him this.  He has been placed on Bactrim for another 7 days.  He needs to follow up with Dr. Janee Morn in 7-10 days.  His office will need to be called and an appointment will need to be made.  Regarding  the patient's blood sugars, we switched him over to Lantus insulin and insulin with meals.  On the current dosage of insulin, his a.m. sugar is usually 120-130.  Subsequent afternoon, sugars are a little more elevated at times going up to 200.  I am maintaining loose control.  As his infection improve, his sugars will drop and I do not want him to become hypoglycemic.  Once his infection has resolved and if his sugars remain high, his insulin should be titrated up.  His hyponatremia was thought to be secondary to dehydration and has corrected with IV fluids.  Last sodium level checked on July 11, 2011, was 140.  PHYSICAL EXAMINATION:  GENERAL:  The patient continues to have significant amount of induration and discoloration of the back of his neck but swelling has improved significantly.  He has 2 incisions which are packed with white gauze.  Base of these incisions have a small amount of necrotic material present but are otherwise clean. LUNGS:  Clear bilaterally. HEART:  Regular rate and rhythm.  No murmurs. ABDOMEN:  Obese, soft, nontender, nondistended.  Bowel sounds positive. EXTREMITIES:  No cyanosis, clubbing or edema.  CONDITION ON DISCHARGE:  Stable.  FOLLOWUP INSTRUCTIONS:  Follow up with Dr. Violeta Gelinas in 7-10 days, again appointment will need to be made.  OTHER ORDERS:  The patient should have Accu-Cheks done t.i.d. before meals and at bedtime.  Dressing changes have been written on the discharge paperwork by Surgery.  The patient's diet should be low sodium, low fat 1800 calories strict diabetic diet.  Time on patient care today was 60 minutes.     Calvert Cantor, M.D.     SR/MEDQ  D:  07/12/2011  T:  07/12/2011  Job:  [id number]  cc:   Florentina Jenny, MD Gabrielle Dare. Janee Morn, M.D.  Electronically Signed by Calvert Cantor M.D. on 07/16/2011 08:40:09 AM

## 2011-07-19 NOTE — Consult Note (Signed)
[name], [name]               ACCOUNT NO.:  [id number]  MEDICAL RECORD NO.:  [id number]  LOCATION:  5033                         FACILITY:  [facility]  PHYSICIAN:  Almond Lint, MD       DATE OF BIRTH:  [birth year]  DATE OF CONSULTATION:  07/06/2011 DATE OF DISCHARGE:                                CONSULTATION   CHIEF COMPLAINT:  Hidradenitis and cellulitis of the posterior neck.  HISTORY OF PRESENT ILLNESS:  Mr. [name] is a 65 year old male who lives in an assisted living facility with 4 weeks of infection to his posterior neck.  Originally, it was red and painful.  At some point, he was put on prednisone because it was felt to be a rash; however, a month ago got worse.  This was stopped and he underwent an I and D at his primary care physician's office 4 days prior to admission.  He was placed on Cipro.  Over the last 4 days this has gotten worse and the redness and swelling has spread.  The pain is severe.  He has low grade fevers but has not had rigors.  He is unable to lay on this area.  PAST MEDICAL HISTORY:  Significant for hypertension, diabetes, anemia, anxiety, hyperlipidemia, irritable bowel syndrome and obesity.  SURGICAL HISTORY:  Negative.  FAMILY HISTORY:  Coronary artery disease.  SOCIAL HISTORY:  Lives in [facility].  Primary care physician is Dr. Redmond School.  He does not abuse substances.  ALLERGIES:  PENICILLIN.  MEDICATIONS:  Aspirin, ciprofloxacin, glipizide, iron, Januvia and others.  REVIEW OF SYSTEMS:  Positive for nausea and vomiting in the HPI, otherwise negative x11 systems.  On examination; temperature is 99.1, pulse 79, respiratory rate 20, blood pressure 122/78.  He is alert and oriented x3, looks uncomfortable.   HEENT; normocephalic, atraumatic.  He has some small pustules on the front of his neck that appeared very superficial.  The posterior neck is red and angry and there is an open area around the size of a nickle that is  draining pus out of it.  This is very sensitive.  Surrounding this entire posterior neck, there is diffuse erythema with small punctuate areas of oozing pus out of the wound. There is no overt fluctuance.   Heart is regular rate and rhythm.  No murmurs.   Lungs are clear bilaterally.  No wheezes, rales or rhonchi. Abdomen is soft, nontender, nondistended.   Extremities are warm and well perfused.  No pitting edema.   Psych, mood and affect is normal.   LABORATORY FINDINGS:  White count is 11.7 with neutrophils portion of 84%, hemoglobin 12, hematocrit 35.4, platelet count 93,000. Electrolytes; sodium 126, potassium 4.5, chloride 90, CO2 of 26, glucose 412, BUN 23, creatinine 0.98, glucose 408.  ASSESSMENT AND PLAN:  Mr. 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  On IV antibiotics and IV fluids and will likely need debridement.  This is open and is draining but the area is getting worse.  He likely will just need a washout.  Some of these microabscesses with cellulitis are very difficult to debride surgically but also very difficult to take  care of medically.  This also is something that may require staged operative intervention.     Almond Lint, MD     FB/MEDQ  D:  07/06/2011  T:  07/07/2011  Job:  [id number]  Electronically Signed by Almond Lint MD on 07/19/2011 01:45:50 PM

## 2011-07-19 NOTE — Op Note (Signed)
  NAMEJUNIE, [name]               ACCOUNT NO.:  [id number]  MEDICAL RECORD NO.:  [id number]  LOCATION:  5033                         FACILITY:  [facility]  PHYSICIAN:  Gabrielle Dare. Janee Morn, M.D.DATE OF BIRTH:  1946/06/27  DATE OF PROCEDURE:  07/08/2011 DATE OF DISCHARGE:                              OPERATIVE REPORT   PREOPERATIVE DIAGNOSIS:  Posterior neck mass.  POSTOPERATIVE DIAGNOSIS:  Posterior neck mass.  PROCEDURE:  Incision, drainage and debridement of posterior neck abscess.  SURGEON:  Gabrielle Dare. Janee Morn, MD  ANESTHESIA:  General endotracheal.  HISTORY OF PRESENT ILLNESS:  [name] is a 65 year old gentleman who is on the Hospitalist Service being treated for his severe posterior neck infection.  He was brought to the operating room today for irrigation, drainage and debridement.  PROCEDURE IN DETAIL:  Informed consent was obtained from the patient's health care power of attorney his sister.  He was identified in the preop holding area.  He is on antibiotic regimen intravenously with vancomycin and Primaxin.  He was brought to the operating room.  General endotracheal anesthesia was administered by the Anesthesia staff.  He was placed in prone position.  His posterior neck was prepped and draped in sterile fashion.  We did time-out procedure.  Inspection of his neck revealed a smaller pustule to the right of midline and then more severe disease to the left of midline with multiple small eruptions of pus through the skin.  Attention was first directed to the more severe part towards the left.  A long narrow ellipse of tissue was excised out revealing a large pus.  This was sent for cultures both aerobic and anaerobic.  This tissue was removed with Bovie cautery.  Further palpation of the tissue revealed continued flow of pus from several areas, so that the debrided tissue was widened with further concentric incision and this tissue was also debrided away.  There was a  tunnel that extended medially and some further pus was obtained from there. Area was copiously irrigated again purulence went throughout.  Some of these tissues could be milked into the opened area, so this was done and the area was widely irrigated.  Next, an area around the pustule over to the right side was cored out with an ellipse of tissue underlying the purulent material was thoroughly irrigated out, this did not seem to directly connect with the other debridement side.  Both wounds were copiously irrigated.  Meticulous hemostasis was obtained.  Tissues were remaining were viable and both wounds were packed with quarter inch Iodoform gauze followed by sterile gauze dressing.  The patient tolerated the procedure well without apparent complication, was taken recovery room in stable condition.  He will be maintained on intravenous antibiotics.     Gabrielle Dare Janee Morn, M.D.     BET/MEDQ  D:  07/08/2011  T:  07/08/2011  Job:  [id number]  Electronically Signed by Violeta Gelinas M.D. on 07/19/2011 08:23:03 PM

## 2011-09-06 LAB — POCT I-STAT, CHEM 8
BUN: 16
Calcium, Ion: 1.17
Chloride: 102
Creatinine, Ser: 1.3
Glucose, Bld: 440 — ABNORMAL HIGH

## 2011-09-06 LAB — BASIC METABOLIC PANEL
BUN: 16
CO2: 25
Chloride: 101
Creatinine, Ser: 0.84

## 2011-09-06 LAB — DIFFERENTIAL
Basophils Absolute: 0
Basophils Relative: 1
Eosinophils Absolute: 0.1
Eosinophils Relative: 3
Lymphocytes Relative: 33
Lymphocytes Relative: 36
Lymphs Abs: 0.7
Monocytes Absolute: 0.2
Monocytes Absolute: 0.2
Monocytes Relative: 8
Monocytes Relative: 9
Neutro Abs: 1.4 — ABNORMAL LOW
Neutro Abs: 1 — ABNORMAL LOW
Neutrophils Relative %: 52

## 2011-09-06 LAB — CBC
HCT: 31.6 — ABNORMAL LOW
Hemoglobin: 12.7 — ABNORMAL LOW
MCHC: 34.4
MCHC: 34.8
MCV: 85.6
RBC: 4.19 — ABNORMAL LOW
RBC: 3.69 — ABNORMAL LOW

## 2011-09-06 LAB — URINE MICROSCOPIC-ADD ON

## 2011-09-06 LAB — URINALYSIS, ROUTINE W REFLEX MICROSCOPIC
Glucose, UA: >1000 — AB
Hgb urine dipstick: NEGATIVE
Protein, ur: NEGATIVE

## 2011-09-20 LAB — CBC
MCHC: 33.8
Platelets: 188
RDW: 13.4

## 2011-09-20 LAB — BASIC METABOLIC PANEL
BUN: 19
Calcium: 8.9
Creatinine, Ser: 1.09
GFR calc non Af Amer: >60
Glucose, Bld: 57 — ABNORMAL LOW
Sodium: 137

## 2011-09-21 LAB — BASIC METABOLIC PANEL
BUN: 10
BUN: 5 — ABNORMAL LOW
BUN: 6
CO2: 23
CO2: 24
CO2: 26
Calcium: 7.2 — ABNORMAL LOW
Calcium: 7.4 — ABNORMAL LOW
Calcium: 7.4 — ABNORMAL LOW
Calcium: 7.7 — ABNORMAL LOW
Calcium: 8 — ABNORMAL LOW
Chloride: 101
Chloride: 104
Creatinine, Ser: 0.8
Creatinine, Ser: 0.87
Creatinine, Ser: 0.89
Creatinine, Ser: 0.99
GFR calc Af Amer: >60
GFR calc Af Amer: >60
GFR calc Af Amer: >60
GFR calc non Af Amer: >60
GFR calc non Af Amer: >60
GFR calc non Af Amer: >60
Glucose, Bld: 164 — ABNORMAL HIGH
Glucose, Bld: 221 — ABNORMAL HIGH
Glucose, Bld: 233 — ABNORMAL HIGH
Potassium: 3.5
Potassium: 3.8
Sodium: 134 — ABNORMAL LOW
Sodium: 136
Sodium: 137
Sodium: 138

## 2011-09-21 LAB — COMPREHENSIVE METABOLIC PANEL
ALT: 18
AST: 21
Alkaline Phosphatase: 105
CO2: 23
GFR calc Af Amer: >60
GFR calc non Af Amer: >60
Glucose, Bld: 389 — ABNORMAL HIGH
Potassium: 3.3 — ABNORMAL LOW
Sodium: 128 — ABNORMAL LOW

## 2011-09-21 LAB — CBC
HCT: 29.7 — ABNORMAL LOW
HCT: 30.9 — ABNORMAL LOW
HCT: 31.8 — ABNORMAL LOW
Hemoglobin: 10.5 — ABNORMAL LOW
Hemoglobin: 10.6 — ABNORMAL LOW
Hemoglobin: 10.9 — ABNORMAL LOW
Hemoglobin: 12.7 — ABNORMAL LOW
MCHC: 34
MCHC: 34.1
MCHC: 34.1
MCV: 84
Platelets: 111 — ABNORMAL LOW
RBC: 3.61 — ABNORMAL LOW
RBC: 3.69 — ABNORMAL LOW
RBC: 4.45
RDW: 13
RDW: 13
RDW: 13.2
RDW: 13.2
WBC: 6.3
WBC: 6.9

## 2011-09-21 LAB — FERRITIN: Ferritin: 1086 — ABNORMAL HIGH (ref 22–322)

## 2011-09-21 LAB — CULTURE, BLOOD (ROUTINE X 2)
Culture: NO GROWTH
Culture: NO GROWTH

## 2011-09-21 LAB — DIFFERENTIAL
Basophils Relative: 0
Eosinophils Absolute: 0 — ABNORMAL LOW
Eosinophils Relative: 1
Lymphs Abs: 0.7
Monocytes Relative: 5
Neutrophils Relative %: 84 — ABNORMAL HIGH

## 2011-09-21 LAB — HEMOGLOBIN A1C
Hgb A1c MFr Bld: 12.4 — ABNORMAL HIGH
Mean Plasma Glucose: 364

## 2011-09-21 LAB — URINALYSIS, ROUTINE W REFLEX MICROSCOPIC
Bilirubin Urine: NEGATIVE
Glucose, UA: >1000 — AB
Hgb urine dipstick: NEGATIVE
Ketones, ur: 15 — AB
Leukocytes, UA: NEGATIVE
Nitrite: NEGATIVE
Protein, ur: NEGATIVE
Specific Gravity, Urine: 1.035 — ABNORMAL HIGH
Urobilinogen, UA: 1
pH: 5.5

## 2011-09-21 LAB — IRON AND TIBC: TIBC: 147 — ABNORMAL LOW

## 2011-09-21 LAB — GENTAMICIN LEVEL, RANDOM: Gentamicin Rm: 4.3

## 2011-09-21 LAB — RETICULOCYTES
RBC.: 3.76 — ABNORMAL LOW
Retic Ct Pct: 0.6

## 2011-09-21 LAB — URINE MICROSCOPIC-ADD ON

## 2011-09-22 ENCOUNTER — Emergency Department (HOSPITAL_COMMUNITY)
Admission: EM | Admit: 2011-09-22 | Discharge: 2011-09-22 | Disposition: A | Discharge status: Home / Self Care | Payer: Medicare Other | Attending: Emergency Medicine | Admitting: Emergency Medicine

## 2011-09-22 DIAGNOSIS — K589 Irritable bowel syndrome without diarrhea: Secondary | ICD-10-CM | POA: Insufficient documentation

## 2011-09-22 DIAGNOSIS — M79609 Pain in unspecified limb: Secondary | ICD-10-CM | POA: Insufficient documentation

## 2011-09-22 DIAGNOSIS — Z7982 Long term (current) use of aspirin: Secondary | ICD-10-CM | POA: Insufficient documentation

## 2011-09-22 DIAGNOSIS — E875 Hyperkalemia: Secondary | ICD-10-CM | POA: Insufficient documentation

## 2011-09-22 DIAGNOSIS — Z794 Long term (current) use of insulin: Secondary | ICD-10-CM | POA: Insufficient documentation

## 2011-09-22 DIAGNOSIS — E1169 Type 2 diabetes mellitus with other specified complication: Secondary | ICD-10-CM | POA: Insufficient documentation

## 2011-09-22 DIAGNOSIS — E1142 Type 2 diabetes mellitus with diabetic polyneuropathy: Secondary | ICD-10-CM | POA: Insufficient documentation

## 2011-09-22 DIAGNOSIS — Z79899 Other long term (current) drug therapy: Secondary | ICD-10-CM | POA: Insufficient documentation

## 2011-09-22 DIAGNOSIS — E785 Hyperlipidemia, unspecified: Secondary | ICD-10-CM | POA: Insufficient documentation

## 2011-09-22 DIAGNOSIS — I10 Essential (primary) hypertension: Secondary | ICD-10-CM | POA: Insufficient documentation

## 2011-09-22 DIAGNOSIS — R42 Dizziness and giddiness: Secondary | ICD-10-CM | POA: Insufficient documentation

## 2011-09-22 LAB — GLUCOSE, CAPILLARY
Glucose-Capillary: 311 mg/dL — ABNORMAL HIGH (ref 70–99)
Glucose-Capillary: 284 mg/dL — ABNORMAL HIGH (ref 70–99)

## 2011-09-22 LAB — CBC
Hemoglobin: 11.7 g/dL — ABNORMAL LOW (ref 13.0–17.0)
MCH: 28.9 pg (ref 26.0–34.0)
MCHC: 33.8 g/dL (ref 30.0–36.0)

## 2011-09-22 LAB — DIFFERENTIAL
Basophils Relative: 0 % (ref 0–1)
Eosinophils Absolute: 0 10*3/uL (ref 0.0–0.7)
Monocytes Absolute: 0.2 10*3/uL (ref 0.1–1.0)
Monocytes Relative: 7 % (ref 3–12)
Neutro Abs: 2.2 10*3/uL (ref 1.7–7.7)

## 2011-09-22 LAB — URINALYSIS, ROUTINE W REFLEX MICROSCOPIC
Bilirubin Urine: NEGATIVE
Glucose, UA: >1000 mg/dL — AB
Hgb urine dipstick: NEGATIVE
Ketones, ur: NEGATIVE mg/dL
pH: 5 (ref 5.0–8.0)

## 2011-09-22 LAB — BASIC METABOLIC PANEL
BUN: 24 mg/dL — ABNORMAL HIGH (ref 6–23)
CO2: 26 mEq/L (ref 19–32)
Chloride: 98 mEq/L (ref 96–112)
Creatinine, Ser: 0.92 mg/dL (ref 0.50–1.35)
GFR calc Af Amer: >90 mL/min (ref >90)
Glucose, Bld: 316 mg/dL — ABNORMAL HIGH (ref 70–99)

## 2011-09-22 LAB — POCT I-STAT, CHEM 8
BUN: 27 mg/dL — ABNORMAL HIGH (ref 6–23)
BUN: 25 mg/dL — ABNORMAL HIGH (ref 6–23)
Calcium, Ion: 1.21 mmol/L (ref 1.12–1.32)
Chloride: 106 mEq/L (ref 96–112)
HCT: 34 % — ABNORMAL LOW (ref 39.0–52.0)
Potassium: 4.4 mEq/L (ref 3.5–5.1)
Sodium: 139 mEq/L (ref 135–145)
TCO2: 25 mmol/L (ref 0–100)

## 2011-09-22 LAB — POCT I-STAT TROPONIN I: Troponin i, poc: 0 ng/mL (ref 0.00–0.08)

## 2012-01-13 ENCOUNTER — Telehealth: Payer: Self-pay | Admitting: Oncology

## 2012-01-13 NOTE — Telephone Encounter (Signed)
S/w pt re appt for 2/5 @ 10:30 am w/FS. Per pt called his sister celeste dixon who provides transportation for him. Called celeste @ [phone] and gv her appt for 2/5 @ 10:30 am

## 2012-01-14 ENCOUNTER — Telehealth: Payer: Self-pay | Admitting: Oncology

## 2012-01-14 NOTE — Telephone Encounter (Signed)
Referred by Dr. Bearl Mulberry Dx-Thrombocytopenia

## 2012-01-17 ENCOUNTER — Other Ambulatory Visit: Payer: Self-pay | Admitting: Oncology

## 2012-01-18 ENCOUNTER — Ambulatory Visit: Payer: Medicare Other

## 2012-01-18 ENCOUNTER — Ambulatory Visit (HOSPITAL_BASED_OUTPATIENT_CLINIC_OR_DEPARTMENT_OTHER): Payer: Medicare Other | Admitting: Oncology

## 2012-01-18 ENCOUNTER — Other Ambulatory Visit (HOSPITAL_BASED_OUTPATIENT_CLINIC_OR_DEPARTMENT_OTHER): Payer: Medicare Other

## 2012-01-18 DIAGNOSIS — D61818 Other pancytopenia: Secondary | ICD-10-CM

## 2012-01-18 DIAGNOSIS — K7460 Unspecified cirrhosis of liver: Secondary | ICD-10-CM

## 2012-01-18 LAB — CBC WITH DIFFERENTIAL/PLATELET
Eosinophils Absolute: 0 10*3/uL (ref 0.0–0.5)
HCT: 32 % — ABNORMAL LOW (ref 38.4–49.9)
LYMPH%: 24.2 % (ref 14.0–49.0)
MONO#: 0.2 10*3/uL (ref 0.1–0.9)
NEUT#: 1.9 10*3/uL (ref 1.5–6.5)
NEUT%: 67.7 % (ref 39.0–75.0)
Platelets: 74 10*3/uL — ABNORMAL LOW (ref 140–400)
WBC: 2.7 10*3/uL — ABNORMAL LOW (ref 4.0–10.3)
lymph#: 0.7 10*3/uL — ABNORMAL LOW (ref 0.9–3.3)

## 2012-01-18 LAB — COMPREHENSIVE METABOLIC PANEL
ALT: 24 U/L (ref 0–53)
CO2: 23 mEq/L (ref 19–32)
Calcium: 8.9 mg/dL (ref 8.4–10.5)
Chloride: 104 mEq/L (ref 96–112)
Glucose, Bld: 118 mg/dL — ABNORMAL HIGH (ref 70–99)
Sodium: 138 mEq/L (ref 135–145)
Total Bilirubin: 0.6 mg/dL (ref 0.3–1.2)
Total Protein: 7.6 g/dL (ref 6.0–8.3)

## 2012-01-18 LAB — CHCC SMEAR

## 2012-01-18 NOTE — Progress Notes (Signed)
Note dictated

## 2012-01-19 NOTE — Progress Notes (Signed)
CC:   [facility]  REASON FOR CONSULTATION:  Pancytopenia.  HISTORY OF PRESENT ILLNESS:  [name] is a pleasant gentleman, a native of [place] but lived in [place] the [place] of his life.  He is a gentleman with a past medical history significant for diabetes as well as cirrhosis of the liver.  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  He has also served in [place] and has had medical care done at the [facility].  He currently lives in an assisted living facility at [facility] and has been doing so for the last few months.  Before that he was in a different assisted living facility.  He had lived independently for awhile; however, he was having some health issues and poor compliance with medication.  For that reason, he has been under assisted living with better monitoring of his medication.  As mentioned, he gets his checkups at the assisted living facility under the care of physician [facility], and his most recent laboratory data done on January 21st showed that his hemoglobin was 11.2, his white cell count was 2.0, platelet count was 89, and for that reason he was referred to me for a pancytopenia.  He is asymptomatic from this.  He had not had any bleeding problems.  Had not reported any recent hospitalization.  Had not had any illnesses related to that.  He has had hemorrhoidal bleeds in the past that was evaluated and had been taken care of for the most part.  He had not had any recurrent sinopulmonary infection.  Overall performance status and activity level has not really changed dramatically in recent times. Looking back at his laboratory data, he has had fluctuating pancytopenias dating back to at least 2 years ago.  His white cell count was 3.4 in 2010 and the platelet count 73,000.  October of 2010, his hemoglobin was as low as 10.8, platelet count  was 89, and he had fluctuating leukopenia with  a white cell count ranging from 3.4 to 9.9.  REVIEW OF SYSTEMS:  Does not have any headaches, blurry vision, double vision.  Does not report any motor or sensory neuropathy.  Did not report any alteration in mental status.  Did not report any psychiatric issues, depression.  Did not report any fever, chills, sweats . Did not report any cough, hemoptysis, hematemesis.  No nausea or vomiting.  No abdominal pain.  No hematochezia or melena.  No genitourinary complaints.  Rest review of systems is unremarkable.  PAST MEDICAL HISTORY:  significant for history of diabetes, history of inflammatory bowel disease, history of hypertension, irritable bowel syndromes, vertigo, reflux symptoms, history of recurrent boil infections.  MEDICATIONS:  He is on Maalox, Prilosec, Lantus, Januvia, fenofibrate, Tylenol, hydrocodone, NovoLog, meclizine, sertraline, iron sulfate, metformin simvastatin and guaifenesin, also MiraLax.  ALLERGIES:  Penicillin.  SOCIAL HISTORY:  He is single.  He was married 3 times in the past.  He has a heavy drinking history, quit 3 years ago, and he had been told that he has cirrhosis of the liver related to his alcohol.  He currently does not smoke.  He does not have any children.  FAMILY HISTORY:  Really unremarkable for any blood disorders or malignancy.  PHYSICAL EXAMINATION:  Alert, awake gentleman, appeared in no active distress.  His blood pressure is 100/61, pulse 72, respirations 20.  He is afebrile.  Head is normocephalic, atraumatic.  Pupils equal and round, reactive to light.  Oral mucosa moist and pink.  Neck:  Supple without adenopathy.  Heart:  Regular rate and rhythm.  S1 and S2. Lungs:  Clear to auscultation.  No rhonchi, wheeze or dullness to percussion.  Abdomen:  Soft, nontender.  Could not appreciate any hepatosplenomegaly.  Extremities:  No edema.  LABORATORY DATA:  Laboratory data today showed a hemoglobin of 10.7, white cell count of 2.7,  platelet count of 74.  Differential was essentially normal with an absolute neutrophil count 1900.  Peripheral smear was personally reviewed today.  Did not appreciate any evidence of any myelodysplasia.  Did not see any evidence of any schistocytosis. Red cells appeared normal in morphology.  ASSESSMENT AND PLAN:  Mr. Cervi is a pleasant 66 year old gentleman, multiple comorbid conditions, with chronic fluctuating mild pancytopenia.  The differential diagnosis discussed today with Mr. Cowher including medication that is causing myelosuppression versus early signs of myelodysplasia.  Most likely the etiology in him, however, I feel it is cirrhosis of the liver.  He has had chronic fluctuating pancytopenia that dates back at least at least 2 years.  I think his story is consistent with liver disease and subsequently causing possible portal hypertension, mild splenomegaly causing sequestration of his blood counts, so what we are probably seeing is sequelae of both his liver disease and possibly chronic alcohol consumption.  To work this up, I will obtain an ultrasound to delineate whether he has really worsening liver disease at this time.  Beyond that, really no intervention is needed.  I think just continue with observation and followup every 4-6 months and make sure his blood counts are not deteriorating.  However, if we do see that his blood counts are declining without any good reason, then a bone marrow biopsy would be warranted at this time.    ______________________________ Benjiman Core, M.D. FNS/MEDQ  D:  01/18/2012  T:  01/18/2012  Job:  [id number]

## 2012-01-24 ENCOUNTER — Ambulatory Visit (INDEPENDENT_AMBULATORY_CARE_PROVIDER_SITE_OTHER): Payer: Medicare Other | Admitting: Endocrinology

## 2012-01-24 ENCOUNTER — Ambulatory Visit (HOSPITAL_COMMUNITY)
Admission: RE | Admit: 2012-01-24 | Discharge: 2012-01-24 | Disposition: A | Discharge status: Home / Self Care | Payer: Medicare Other | Source: Ambulatory Visit | Attending: Oncology | Admitting: Oncology

## 2012-01-24 ENCOUNTER — Encounter: Payer: Self-pay | Admitting: Endocrinology

## 2012-01-24 DIAGNOSIS — L409 Psoriasis, unspecified: Secondary | ICD-10-CM

## 2012-01-24 DIAGNOSIS — E1142 Type 2 diabetes mellitus with diabetic polyneuropathy: Secondary | ICD-10-CM

## 2012-01-24 DIAGNOSIS — K219 Gastro-esophageal reflux disease without esophagitis: Secondary | ICD-10-CM | POA: Insufficient documentation

## 2012-01-24 DIAGNOSIS — I10 Essential (primary) hypertension: Secondary | ICD-10-CM | POA: Insufficient documentation

## 2012-01-24 DIAGNOSIS — K7460 Unspecified cirrhosis of liver: Secondary | ICD-10-CM

## 2012-01-24 DIAGNOSIS — L408 Other psoriasis: Secondary | ICD-10-CM

## 2012-01-24 DIAGNOSIS — R161 Splenomegaly, not elsewhere classified: Secondary | ICD-10-CM | POA: Insufficient documentation

## 2012-01-24 DIAGNOSIS — E785 Hyperlipidemia, unspecified: Secondary | ICD-10-CM | POA: Insufficient documentation

## 2012-01-24 DIAGNOSIS — E1049 Type 1 diabetes mellitus with other diabetic neurological complication: Secondary | ICD-10-CM

## 2012-01-24 DIAGNOSIS — D61818 Other pancytopenia: Secondary | ICD-10-CM | POA: Insufficient documentation

## 2012-01-24 DIAGNOSIS — F329 Major depressive disorder, single episode, unspecified: Secondary | ICD-10-CM

## 2012-01-24 DIAGNOSIS — D539 Nutritional anemia, unspecified: Secondary | ICD-10-CM | POA: Insufficient documentation

## 2012-01-24 DIAGNOSIS — E1149 Type 2 diabetes mellitus with other diabetic neurological complication: Secondary | ICD-10-CM

## 2012-01-24 DIAGNOSIS — F419 Anxiety disorder, unspecified: Secondary | ICD-10-CM | POA: Insufficient documentation

## 2012-01-24 NOTE — Patient Instructions (Addendum)
You should have your carotid arteries checked, because you have have blockages (but i can't be sure). good diet and exercise habits significanly improve the control of your diabetes.  please let me know if you wish to be referred to a dietician.  high blood sugar is very risky to your health.  you should see an eye doctor every year. controlling your blood pressure and cholesterol drastically reduces the damage diabetes does to your body.  this also applies to quitting smoking.  please discuss these with your doctor.  you should take an aspirin every day, unless you have been advised by a doctor not to. You should have your blood sugar checked, 2-3 times a day.  vary the time of day when you check, between before the 3 meals, and at bedtime.  also check if you have symptoms of your blood sugar being too high or too low.  please keep a record of the readings and bring it to your next appointment here.  please call us sooner if your blood sugar goes below 70, or if it stays over 200.  For now, continue the same lantus, and: change novolog 70/30 to novolog 14 units 3x a day (just before each meal). Please come back for a follow-up appointment in 2-4 weeks.

## 2012-01-24 NOTE — Progress Notes (Signed)
Subjective:    Patient ID: [name], male    DOB: 1946/12/01, 66 y.o.   MRN: [id number]  HPI pt states 30 years h/o dm.  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  he has been on insulin since dx.  pt says his diet and exercise are both very good.  he brings a record of his cbg's which i have reviewed today.  It varies from 170-300,  but pt states cbg's vary even more widely (70-600).  It is in general higher as the day goes on.   He reports many years of moderate numbness of the feet, and slight assoc pain.   No past medical history on file.  No past surgical history on file.  History   Social History  . Marital Status: Divorced    Spouse Name: N/A    Number of Children: N/A  . Years of Education: N/A   Occupational History  . Not on file.   Social History Main Topics  . Smoking status: Never Smoker   . Smokeless tobacco: Not on file  . Alcohol Use: No  . Drug Use: No  . Sexually Active: Not on file   Other Topics Concern  . Not on file   Social History Narrative  . No narrative on file    Current Outpatient Prescriptions on File Prior to Visit  Medication Sig Dispense Refill  . aluminum-magnesium hydroxide 200-200 MG/5ML suspension Take 30 mLs by mouth 3 (three) times daily as needed.      Marland Kitchen buPROPion (WELLBUTRIN) 100 MG tablet Take 100 mg by mouth daily.      Marland Kitchen docusate sodium (COLACE) 100 MG capsule Take 100 mg by mouth at bedtime.      . ergocalciferol (VITAMIN D2) 50000 UNITS capsule Take 50,000 Units by mouth once a week.      . ferrous sulfate 325 (65 FE) MG tablet Take 325 mg by mouth daily with breakfast.      . HYDROcodone-acetaminophen (NORCO) 5-325 MG per tablet Take 1 tablet by mouth every 6 (six) hours as needed.      Marland Kitchen ibuprofen (ADVIL,MOTRIN) 200 MG tablet Take 400 mg by mouth 3 (three) times daily.      . insulin aspart protamine-insulin aspart (NOVOLOG 70/30) (70-30) 100 UNIT/ML injection Inject 14 Units into the skin 3 (three) times  daily.      . insulin glargine (LANTUS) 100 UNIT/ML injection Inject 26 Units into the skin daily.      Marland Kitchen loperamide (IMODIUM A-D) 2 MG tablet Take 2 mg by mouth 4 (four) times daily as needed.      . Melatonin 3 MG CAPS Take 3 mg by mouth at bedtime.      . metFORMIN (GLUCOPHAGE) 1000 MG tablet Take 1,000 mg by mouth 2 (two) times daily with a meal.      . mupirocin nasal ointment (BACTROBAN) 2 % Place 1 application into the nose 2 (two) times daily. Use one-half of tube in each nostril twice daily for five (5) days. After application, press sides of nose together and gently massage.      . neomycin-bacitracin-polymyxin (NEOSPORIN) ointment Apply 1 application topically every 6 (six) hours as needed. apply to eye      . nystatin (MYCOSTATIN) powder Apply 15 g topically daily.      Marland Kitchen nystatin-triamcinolone (MYCOLOG II) cream Apply 1 application topically 2 (two) times daily.      Marland Kitchen omeprazole (PRILOSEC) 20 MG capsule Take 20 mg by  mouth daily.      . phenylephrine-shark liver oil-mineral oil-petrolatum (PREPARATION H) 0.25-3-14-71.9 % rectal ointment Place 1 application rectally 4 (four) times daily as needed.      . sertraline (ZOLOFT) 25 MG tablet Take 75 mg by mouth daily.      . simvastatin (ZOCOR) 40 MG tablet Take 40 mg by mouth every evening.      Marland Kitchen witch hazel-glycerin (TUCKS) pad Place 1 application rectally as needed.      . zolpidem (AMBIEN) 5 MG tablet Take 5 mg by mouth at bedtime as needed.      DM: none  Allergies  Allergen Reactions  . Penicillins Hives    No family history on file.  BP 102/62  Pulse 77  Temp(Src) 97.5 F (36.4 C) (Oral)  Ht 5\' 5"  (1.651 m)  Wt 183 lb (83.008 kg)  BMI 30.45 kg/m2  SpO2 96%   Review of Systems  Constitutional: Negative for unexpected weight change.  HENT: Negative for hearing loss.   Eyes: Negative for visual disturbance.  Respiratory: Negative for shortness of breath.   Cardiovascular: Negative for chest pain.    Gastrointestinal:       He had colonoscopy for brbpr  Genitourinary: Negative for hematuria.  Musculoskeletal: Positive for back pain.  Skin: Negative for wound.  Neurological: Negative for syncope.  Hematological: Bruises/bleeds easily.  Psychiatric/Behavioral: Positive for dysphoric mood.       Objective:   Physical Exam VS: see vs page GEN: no distress HEAD: head: no deformity eyes: no periorbital swelling, no proptosis external nose and ears are normal mouth: no lesion seen NECK: supple, thyroid is not enlarged.  There are a few healed surgical scars at the posterior neck (i and d of abscesses) CHEST WALL: no deformity LUNGS: clear to auscultation CV: reg rate and rhythm, no murmur.  MUSCULOSKELETAL: muscle bulk and strength are grossly normal.  no obvious joint swelling.  gait is normal and steady EXTEMITIES: no deformity.  no ulcer on the feet.  feet are of normal color and temp.  no edema PULSES: dorsalis pedis intact bilat.  There are possible carotid bruits NEURO:  cn 2-12 grossly intact.   readily moves all 4's.  sensation is intact to touch on the feet, but decreased from normal SKIN:  Normal texture and temperature.  No rash or suspicious lesion is visible.   NODES:  None palpable at the neck.   PSYCH: alert, oriented x3.  Does not appear anxious nor depressed.    outside test results are reviewed: A1c=10%    Assessment & Plan:  DM, needs increased rx.  i agree with dr tripp that this a1c causes a high degree of risk to his health. Neuropathy, prob due to DM Carotid artery bruit is incidentally noted Depression.  This complicated the rx of DM

## 2012-01-25 ENCOUNTER — Other Ambulatory Visit (HOSPITAL_COMMUNITY): Payer: Medicare Other

## 2012-02-08 ENCOUNTER — Ambulatory Visit: Payer: Medicare Other | Admitting: Endocrinology

## 2012-02-24 ENCOUNTER — Other Ambulatory Visit: Payer: Self-pay | Admitting: *Deleted

## 2012-02-24 DIAGNOSIS — R0989 Other specified symptoms and signs involving the circulatory and respiratory systems: Secondary | ICD-10-CM

## 2012-02-29 ENCOUNTER — Encounter: Payer: Self-pay | Admitting: Endocrinology

## 2012-02-29 ENCOUNTER — Encounter (INDEPENDENT_AMBULATORY_CARE_PROVIDER_SITE_OTHER): Payer: Medicare Other

## 2012-02-29 ENCOUNTER — Ambulatory Visit (INDEPENDENT_AMBULATORY_CARE_PROVIDER_SITE_OTHER): Payer: Medicare Other | Admitting: Endocrinology

## 2012-02-29 VITALS — BP 116/62 | HR 76 | Temp 97.4°F | Wt 182.8 lb

## 2012-02-29 DIAGNOSIS — E1149 Type 2 diabetes mellitus with other diabetic neurological complication: Secondary | ICD-10-CM

## 2012-02-29 DIAGNOSIS — E1049 Type 1 diabetes mellitus with other diabetic neurological complication: Secondary | ICD-10-CM

## 2012-02-29 DIAGNOSIS — E1065 Type 1 diabetes mellitus with hyperglycemia: Secondary | ICD-10-CM

## 2012-02-29 DIAGNOSIS — R0989 Other specified symptoms and signs involving the circulatory and respiratory systems: Secondary | ICD-10-CM

## 2012-02-29 DIAGNOSIS — E1142 Type 2 diabetes mellitus with diabetic polyneuropathy: Secondary | ICD-10-CM

## 2012-02-29 NOTE — Progress Notes (Signed)
Subjective:    Patient ID: [name], male    DOB: 10/12/1946, 66 y.o.   MRN: [id number]  HPI Pt returns for f/u of insulin-requiring DM (1983).  he brings a record of his cbg's which i have reviewed today.  It varies from 125-400.  It is in general lowest in the afternoon, and higher at all other times of day.   Past Medical History  Diagnosis Date  . Hyperglycemia   . Hyponatremia   . DMII (diabetes mellitus, type 2)   . Depression   . IBS (irritable bowel syndrome)   . Chest pain   . Unspecified gastritis and gastroduodenitis without mention of hemorrhage   . Arthropathy, unspecified, site unspecified   . History of colon polyps   . Anemia, unspecified   . Anxiety   . GERD (gastroesophageal reflux disease)   . Hyperlipidemia   . Hypertension     Past Surgical History  Procedure Date  . Hernia repair 1964    History   Social History  . Marital Status: Divorced    Spouse Name: N/A    Number of Children: N/A  . Years of Education: N/A   Occupational History  . Not on file.   Social History Main Topics  . Smoking status: Never Smoker   . Smokeless tobacco: Not on file  . Alcohol Use: No  . Drug Use: No  . Sexually Active: Not on file   Other Topics Concern  . Not on file   Social History Narrative  . No narrative on file    Current Outpatient Prescriptions on File Prior to Visit  Medication Sig Dispense Refill  . aluminum-magnesium hydroxide 200-200 MG/5ML suspension Take 30 mLs by mouth 3 (three) times daily as needed.      Marland Kitchen buPROPion (WELLBUTRIN) 100 MG tablet Take 100 mg by mouth daily.      . ferrous sulfate 325 (65 FE) MG tablet Take 325 mg by mouth daily with breakfast.      . HYDROcodone-acetaminophen (NORCO) 5-325 MG per tablet Take 1 tablet by mouth every 6 (six) hours as needed.      Marland Kitchen ibuprofen (ADVIL,MOTRIN) 200 MG tablet Take 400 mg by mouth 3 (three) times daily.      . insulin aspart (NOVOLOG) 100 UNIT/ML injection Inject 14 Units into  the skin 3 (three) times daily before meals.      . insulin glargine (LANTUS) 100 UNIT/ML injection Inject 30 Units into the skin daily.       Marland Kitchen loperamide (IMODIUM A-D) 2 MG tablet Take 2 tablets after first loose stool then 1 additional tablet after next loose stool not to exceed 8 tablets/day      . Melatonin 3 MG CAPS Take 3 mg by mouth at bedtime.      . metFORMIN (GLUCOPHAGE) 1000 MG tablet Take 1,000 mg by mouth 2 (two) times daily with a meal.      . mupirocin nasal ointment (BACTROBAN) 2 % Place 1 application into the nose 2 (two) times daily. Use one-half of tube in each nostril twice daily for five (5) days. After application, press sides of nose together and gently massage.      . neomycin-bacitracin-polymyxin (NEOSPORIN) ointment Apply 1 application topically every 6 (six) hours as needed. apply to scalp      . nystatin (MYCOSTATIN) powder Apply 15 g topically daily as needed.       . nystatin-triamcinolone (MYCOLOG II) cream Apply 1 application topically 2 (two)  times daily.      Marland Kitchen omeprazole (PRILOSEC) 20 MG capsule Take 20 mg by mouth daily.      . phenylephrine-shark liver oil-mineral oil-petrolatum (PREPARATION H) 0.25-3-14-71.9 % rectal ointment Place 1 application rectally 4 (four) times daily as needed.      . senna-docusate (SENNALAX-S) 8.6-50 MG per tablet Take 1 tablet by mouth 2 (two) times daily.      . sertraline (ZOLOFT) 50 MG tablet Take 75 mg by mouth daily.      . simvastatin (ZOCOR) 40 MG tablet Take 40 mg by mouth every evening.      Marland Kitchen witch hazel-glycerin (TUCKS) pad Place 1 application rectally as needed.        Allergies  Allergen Reactions  . Penicillins Hives    Family History  Problem Relation Age of Onset  . Stroke Mother   . Heart attack Father   . Cancer Other     FH of Breast Cancer-other Relative  . Heart disease Other     Parent  . Hypertension Other     parent    BP 116/62  Pulse 76  Temp(Src) 97.4 F (36.3 C) (Oral)  Wt 182 lb 12.8  oz (82.918 kg)  SpO2 96%  Review of Systems denies hypoglycemia    Objective:   Physical Exam VITAL SIGNS:  See vs page GENERAL: no distress SKIN:  Insulin injection sites at the anterior abdomen are normal, except for a few ecchymoses.       Assessment & Plan:  DM, needs increased rx

## 2012-02-29 NOTE — Patient Instructions (Addendum)
You should have your blood sugar checked, 2-3 times a day.  vary the time of day when you check, between before the 3 meals, and at bedtime.  also check if you have symptoms of your blood sugar being too high or too low.  please keep a record of the readings and bring it to your next appointment here.  please call us sooner if your blood sugar goes below 70, or if it stays over 200.  For now, continue the same lantus, and: change novolog to 3x a day (just before each meal) breakfast: 20;  lunch: 14;  and supper: 20 units.   Please come back for a follow-up appointment in 4 weeks.

## 2012-03-03 ENCOUNTER — Encounter: Payer: Self-pay | Admitting: Endocrinology

## 2012-03-31 ENCOUNTER — Ambulatory Visit: Payer: Medicare Other | Admitting: Endocrinology

## 2012-04-04 ENCOUNTER — Ambulatory Visit (INDEPENDENT_AMBULATORY_CARE_PROVIDER_SITE_OTHER): Payer: Medicare Other | Admitting: Endocrinology

## 2012-04-04 ENCOUNTER — Encounter: Payer: Self-pay | Admitting: Endocrinology

## 2012-04-04 VITALS — BP 128/72 | HR 82 | Temp 97.3°F | Ht 65.0 in | Wt 187.0 lb

## 2012-04-04 DIAGNOSIS — E1142 Type 2 diabetes mellitus with diabetic polyneuropathy: Secondary | ICD-10-CM

## 2012-04-04 DIAGNOSIS — E1065 Type 1 diabetes mellitus with hyperglycemia: Secondary | ICD-10-CM

## 2012-04-04 DIAGNOSIS — E1049 Type 1 diabetes mellitus with other diabetic neurological complication: Secondary | ICD-10-CM

## 2012-04-04 DIAGNOSIS — E1149 Type 2 diabetes mellitus with other diabetic neurological complication: Secondary | ICD-10-CM

## 2012-04-04 NOTE — Patient Instructions (Signed)
You should have your blood sugar checked, 2-3 times a day.  vary the time of day when you check, between before the 3 meals, and at bedtime.  also check if you have symptoms of your blood sugar being too high or too low.  please keep a record of the readings and bring it to your next appointment here.  please call us sooner if your blood sugar goes below 70, or if it stays over 200.  For now, continue the same lantus, and: change novolog to 3x a day (just before each meal) breakfast: 20;  lunch: 14;  and supper: 20 units.   Please come back for a follow-up appointment in 4 weeks.

## 2012-04-04 NOTE — Progress Notes (Signed)
Subjective:    Patient ID: [name], male    DOB: [birth year], 66 y.o.   MRN: [id number]  HPI Pt returns for f/u of insulin-requiring DM (dx'ed 1983, complicated by peripheral sensory neuropathy, retinopathy, and CAD).  He is still in novolog 14 units tid (qac).  he brings a record of his cbg's which i have reviewed today.  It varies from 161-400's.  There is no trend throughout the day.  Past Medical History  Diagnosis Date  . Hyperglycemia   . Hyponatremia   . DMII (diabetes mellitus, type 2)   . Depression   . IBS (irritable bowel syndrome)   . Chest pain   . Unspecified gastritis and gastroduodenitis without mention of hemorrhage   . Arthropathy, unspecified, site unspecified   . History of colon polyps   . Anemia, unspecified   . Anxiety   . GERD (gastroesophageal reflux disease)   . Hyperlipidemia   . Hypertension     Past Surgical History  Procedure Date  . Hernia repair 1964    History   Social History  . Marital Status: Divorced    Spouse Name: N/A    Number of Children: N/A  . Years of Education: N/A   Occupational History  . Not on file.   Social History Main Topics  . Smoking status: Never Smoker   . Smokeless tobacco: Not on file  . Alcohol Use: No  . Drug Use: No  . Sexually Active: Not on file   Other Topics Concern  . Not on file   Social History Narrative  . No narrative on file    Current Outpatient Prescriptions on File Prior to Visit  Medication Sig Dispense Refill  . aluminum-magnesium hydroxide 200-200 MG/5ML suspension Take 30 mLs by mouth 3 (three) times daily as needed.      Marland Kitchen aspirin 81 MG tablet Take 81 mg by mouth daily.      Marland Kitchen buPROPion (WELLBUTRIN) 100 MG tablet Take 100 mg by mouth daily.      . fenofibrate 160 MG tablet Take 160 mg by mouth daily.      . ferrous sulfate 325 (65 FE) MG tablet Take 325 mg by mouth daily with breakfast.      . HYDROcodone-acetaminophen (NORCO) 5-325 MG per tablet Take 1 tablet by mouth  every 6 (six) hours as needed.      Marland Kitchen ibuprofen (ADVIL,MOTRIN) 200 MG tablet Take 400 mg by mouth 3 (three) times daily.      . insulin aspart (NOVOLOG) 100 UNIT/ML injection 3x a day (just before each meal) 20-14-20 units      . insulin glargine (LANTUS) 100 UNIT/ML injection Inject 30 Units into the skin daily.       Marland Kitchen loperamide (IMODIUM A-D) 2 MG tablet Take 2 tablets after first loose stool then 1 additional tablet after next loose stool not to exceed 8 tablets/day      . meclizine (ANTIVERT) 25 MG tablet Take 25 mg by mouth every 12 (twelve) hours as needed. For dizziness      . Melatonin 3 MG CAPS Take 3 mg by mouth at bedtime.      . metFORMIN (GLUCOPHAGE) 1000 MG tablet Take 1,000 mg by mouth 2 (two) times daily with a meal.      . Multiple Vitamin (DAILY VITE) TABS Take 1 tablet by mouth daily.      . mupirocin nasal ointment (BACTROBAN) 2 % Place 1 application into the nose 2 (two) times  daily. Use one-half of tube in each nostril twice daily for five (5) days. After application, press sides of nose together and gently massage.      . neomycin-bacitracin-polymyxin (NEOSPORIN) ointment Apply 1 application topically every 6 (six) hours as needed. apply to scalp      . nystatin-triamcinolone (MYCOLOG II) cream Apply 1 application topically 2 (two) times daily.      Marland Kitchen omeprazole (PRILOSEC) 20 MG capsule Take 20 mg by mouth daily.      . phenylephrine-shark liver oil-mineral oil-petrolatum (PREPARATION H) 0.25-3-14-71.9 % rectal ointment Place 1 application rectally 4 (four) times daily as needed.      . senna-docusate (SENNALAX-S) 8.6-50 MG per tablet Take 1 tablet by mouth 2 (two) times daily.      . sertraline (ZOLOFT) 50 MG tablet Take 75 mg by mouth daily.      . simvastatin (ZOCOR) 40 MG tablet Take 40 mg by mouth every evening.      . sitaGLIPtin (JANUVIA) 100 MG tablet Take 100 mg by mouth daily.      Marland Kitchen witch hazel-glycerin (TUCKS) pad Place 1 application rectally as needed.      Marland Kitchen  DISCONTD: zolpidem (AMBIEN) 5 MG tablet Take 5 mg by mouth at bedtime as needed.        Allergies  Allergen Reactions  . Penicillins Hives    Family History  Problem Relation Age of Onset  . Stroke Mother   . Heart attack Father   . Cancer Other     FH of Breast Cancer-other Relative  . Heart disease Other     Parent  . Hypertension Other     parent   BP 128/72  Pulse 82  Temp(Src) 97.3 F (36.3 C) (Oral)  Ht 5\' 5"  (1.651 m)  Wt 187 lb (84.823 kg)  BMI 31.12 kg/m2  SpO2 97%   Review of Systems denies hypoglycemia    Objective:   Physical Exam EXTEMITIES: no deformity.  no ulcer on the feet.  feet are of normal color and temp.  no edema PULSES: dorsalis pedis intact bilat.  There are possible carotid bruits.  NEURO:  cn 2-12 grossly intact.   readily moves all 4's.  sensation is intact to touch on the feet, but decreased from normal.       Assessment & Plan:  DM.  cbg's are still elevated, as he is still on an old insulin dosage.

## 2012-04-06 ENCOUNTER — Telehealth: Payer: Self-pay | Admitting: Endocrinology

## 2012-04-06 NOTE — Telephone Encounter (Signed)
Please call [facility] (pt's facility).  Please note the increase of insulin 2 days ago.  Also, fax [place] cbg's if they stay elevated.

## 2012-04-07 NOTE — Telephone Encounter (Signed)
Insulin increase noted in pt's MAR per Nurse Tech and also informed to fax CBG's if they are elevated.

## 2012-05-04 ENCOUNTER — Ambulatory Visit (INDEPENDENT_AMBULATORY_CARE_PROVIDER_SITE_OTHER): Payer: Medicare Other | Admitting: Endocrinology

## 2012-05-04 ENCOUNTER — Encounter: Payer: Self-pay | Admitting: Endocrinology

## 2012-05-04 VITALS — BP 128/68 | HR 75 | Temp 97.5°F | Ht 65.0 in | Wt 185.0 lb

## 2012-05-04 DIAGNOSIS — E1142 Type 2 diabetes mellitus with diabetic polyneuropathy: Secondary | ICD-10-CM

## 2012-05-04 DIAGNOSIS — E1149 Type 2 diabetes mellitus with other diabetic neurological complication: Secondary | ICD-10-CM

## 2012-05-04 DIAGNOSIS — E1065 Type 1 diabetes mellitus with hyperglycemia: Secondary | ICD-10-CM

## 2012-05-04 DIAGNOSIS — E1049 Type 1 diabetes mellitus with other diabetic neurological complication: Secondary | ICD-10-CM

## 2012-05-04 NOTE — Progress Notes (Signed)
Subjective:    Patient ID: [name], male    DOB: 1946-02-16, 66 y.o.   MRN: [id number]  HPI Pt returns for f/u of insulin-requiring DM (dx'ed 1983, complicated by peripheral sensory neuropathy, retinopathy, and CAD).  He is still in novolog 14 units tid (qac).  he brings a record of his cbg's which i have reviewed today.  It varies from 116-300, but most are approx 200.  Past Medical History  Diagnosis Date  . Hyperglycemia   . Hyponatremia   . DMII (diabetes mellitus, type 2)   . Depression   . IBS (irritable bowel syndrome)   . Chest pain   . Unspecified gastritis and gastroduodenitis without mention of hemorrhage   . Arthropathy, unspecified, site unspecified   . History of colon polyps   . Anemia, unspecified   . Anxiety   . GERD (gastroesophageal reflux disease)   . Hyperlipidemia   . Hypertension     Past Surgical History  Procedure Date  . Hernia repair 1964    History   Social History  . Marital Status: Divorced    Spouse Name: N/A    Number of Children: N/A  . Years of Education: N/A   Occupational History  . Not on file.   Social History Main Topics  . Smoking status: Never Smoker   . Smokeless tobacco: Not on file  . Alcohol Use: No  . Drug Use: No  . Sexually Active: Not on file   Other Topics Concern  . Not on file   Social History Narrative  . No narrative on file    Current Outpatient Prescriptions on File Prior to Visit  Medication Sig Dispense Refill  . aluminum-magnesium hydroxide 200-200 MG/5ML suspension Take 30 mLs by mouth 3 (three) times daily as needed.      Marland Kitchen aspirin 81 MG tablet Take 81 mg by mouth daily.      . fenofibrate 160 MG tablet Take 160 mg by mouth daily.      . ferrous sulfate 325 (65 FE) MG tablet Take 325 mg by mouth daily with breakfast.      . HYDROcodone-acetaminophen (NORCO) 5-325 MG per tablet Take 1 tablet by mouth every 6 (six) hours as needed.      Marland Kitchen ibuprofen (ADVIL,MOTRIN) 200 MG tablet Take 400 mg  by mouth 3 (three) times daily.      . insulin aspart (NOVOLOG) 100 UNIT/ML injection 3x a day (just before each meal) 23-17-23 units      . insulin glargine (LANTUS) 100 UNIT/ML injection Inject 33 Units into the skin daily.       Marland Kitchen loperamide (IMODIUM A-D) 2 MG tablet Take 2 tablets after first loose stool then 1 additional tablet after next loose stool not to exceed 8 tablets/day      . meclizine (ANTIVERT) 25 MG tablet Take 25 mg by mouth every 12 (twelve) hours as needed. For dizziness      . Melatonin 3 MG CAPS Take 3 mg by mouth at bedtime.      . metFORMIN (GLUCOPHAGE) 1000 MG tablet Take 1,000 mg by mouth 2 (two) times daily with a meal.      . Multiple Vitamin (DAILY VITE) TABS Take 1 tablet by mouth daily.      . mupirocin nasal ointment (BACTROBAN) 2 % Place 1 application into the nose 2 (two) times daily. Use one-half of tube in each nostril twice daily for five (5) days. After application, press sides of nose  together and gently massage.      . neomycin-bacitracin-polymyxin (NEOSPORIN) ointment Apply 1 application topically every 6 (six) hours as needed. apply to scalp      . omeprazole (PRILOSEC) 20 MG capsule Take 20 mg by mouth daily.      . phenylephrine-shark liver oil-mineral oil-petrolatum (PREPARATION H) 0.25-3-14-71.9 % rectal ointment Place 1 application rectally 4 (four) times daily as needed.      . senna-docusate (SENNALAX-S) 8.6-50 MG per tablet Take 1 tablet by mouth 2 (two) times daily.      . sertraline (ZOLOFT) 50 MG tablet Take 50 mg by mouth daily.       . simvastatin (ZOCOR) 40 MG tablet Take 40 mg by mouth every evening.      . sitaGLIPtin (JANUVIA) 100 MG tablet Take 100 mg by mouth daily.      Marland Kitchen witch hazel-glycerin (TUCKS) pad Place 1 application rectally as needed.      . nystatin-triamcinolone (MYCOLOG II) cream Apply 1 application topically 2 (two) times daily.      Marland Kitchen DISCONTD: zolpidem (AMBIEN) 5 MG tablet Take 5 mg by mouth at bedtime as needed.         Allergies  Allergen Reactions  . Penicillins Hives   Family History  Problem Relation Age of Onset  . Stroke Mother   . Heart attack Father   . Cancer Other     FH of Breast Cancer-other Relative  . Heart disease Other     Parent  . Hypertension Other     parent   BP 128/68  Pulse 75  Temp(Src) 97.5 F (36.4 C) (Oral)  Ht 5\' 5"  (1.651 m)  Wt 185 lb (83.915 kg)  BMI 30.79 kg/m2  SpO2 98%  Review of Systems denies hypoglycemia    Objective:   Physical Exam VITAL SIGNS:  See vs page GENERAL: no distress PSYCH: Alert and oriented x 3.  Does not appear anxious nor depressed.      Assessment & Plan:  DM.  needs increased rx

## 2012-05-04 NOTE — Patient Instructions (Addendum)
Please check resident's blood sugar 4 times a day--before the 3 meals, and at bedtime.  also check if you have symptoms of your blood sugar being too high or too low.  please keep a record of the readings and bring it to your next appointment here.  please call us sooner if reident's blood sugar goes below 70, or if it stays over 200.  Please increase lantus to 33 units at bedtime, and:  change novolog to 3x a day (just before each meal) breakfast: 23;  lunch: 17;  and supper: 23 units.   Please come back for a follow-up appointment in 6 weeks.

## 2012-05-18 ENCOUNTER — Telehealth: Payer: Self-pay | Admitting: Endocrinology

## 2012-05-18 MED ORDER — INSULIN ASPART 100 UNIT/ML ~~LOC~~ SOLN: SUBCUTANEOUS | Status: DC

## 2012-05-18 MED ORDER — INSULIN GLARGINE 100 UNIT/ML ~~LOC~~ SOLN: 25.0000 [IU] | Freq: Every day | SUBCUTANEOUS | Status: DC

## 2012-05-18 NOTE — Telephone Encounter (Signed)
Addended by: Brenton Grills C on: 05/18/2012 02:10 PM   Modules accepted: Orders

## 2012-05-18 NOTE — Telephone Encounter (Addendum)
Faxed phone note with new insulin dosages to [facility] at [phone]

## 2012-05-18 NOTE — Telephone Encounter (Signed)
i received fax: decrease lantus to 25 units at bedtime, and:  change novolog to 3x a day (just before each meal) breakfast: 30; lunch: 25; and supper: 30 units

## 2012-06-06 ENCOUNTER — Ambulatory Visit (INDEPENDENT_AMBULATORY_CARE_PROVIDER_SITE_OTHER): Payer: Medicare Other | Admitting: Endocrinology

## 2012-06-06 ENCOUNTER — Encounter: Payer: Self-pay | Admitting: Endocrinology

## 2012-06-06 VITALS — BP 138/70 | HR 79 | Temp 98.0°F | Ht 66.0 in | Wt 186.0 lb

## 2012-06-06 DIAGNOSIS — E1149 Type 2 diabetes mellitus with other diabetic neurological complication: Secondary | ICD-10-CM

## 2012-06-06 DIAGNOSIS — E1065 Type 1 diabetes mellitus with hyperglycemia: Secondary | ICD-10-CM

## 2012-06-06 DIAGNOSIS — E1142 Type 2 diabetes mellitus with diabetic polyneuropathy: Secondary | ICD-10-CM

## 2012-06-06 DIAGNOSIS — E1049 Type 1 diabetes mellitus with other diabetic neurological complication: Secondary | ICD-10-CM

## 2012-06-06 NOTE — Patient Instructions (Addendum)
Please check resident's blood sugar 4 times a day--before the 3 meals, and at bedtime.  also check if you have symptoms of your blood sugar being too high or too low.  please keep a record of the readings and bring it to your next appointment here.  please call us sooner if reident's blood sugar goes below 70, or if it stays over 200.  increase lantus to 35 units at bedtime, and:  continue novolog 3x a day (just before each meal) breakfast: 30; lunch: 25; and supper: 30 units.  Please come back for a follow-up appointment in 6 weeks.

## 2012-06-06 NOTE — Progress Notes (Signed)
Subjective:    Patient ID: [name], male    DOB: [birth year], 66 y.o.   MRN: [id number]  HPI Pt returns for f/u of insulin-requiring DM (dx'ed 1983, complicated by peripheral sensory neuropathy, retinopathy, and CAD).  he brings a record of his cbg's which i have reviewed today.  It varies from 149-400, but most are approx 200.  It is highest in am.   Past Medical History  Diagnosis Date  . Hyperglycemia   . Hyponatremia   . DMII (diabetes mellitus, type 2)   . Depression   . IBS (irritable bowel syndrome)   . Chest pain   . Unspecified gastritis and gastroduodenitis without mention of hemorrhage   . Arthropathy, unspecified, site unspecified   . History of colon polyps   . Anemia, unspecified   . Anxiety   . GERD (gastroesophageal reflux disease)   . Hyperlipidemia   . Hypertension     Past Surgical History  Procedure Date  . Hernia repair 1964    History   Social History  . Marital Status: Divorced    Spouse Name: N/A    Number of Children: N/A  . Years of Education: N/A   Occupational History  . Not on file.   Social History Main Topics  . Smoking status: Never Smoker   . Smokeless tobacco: Not on file  . Alcohol Use: No  . Drug Use: No  . Sexually Active: Not on file   Other Topics Concern  . Not on file   Social History Narrative  . No narrative on file    Current Outpatient Prescriptions on File Prior to Visit  Medication Sig Dispense Refill  . aspirin 81 MG tablet Take 81 mg by mouth daily.      Marland Kitchen buPROPion (WELLBUTRIN) 75 MG tablet Take 75 mg by mouth daily.      . Cholecalciferol (VITAMIN D3) 1000 UNITS CAPS Take 1 capsule by mouth daily.      . fenofibrate 160 MG tablet Take 160 mg by mouth daily.      . ferrous sulfate 325 (65 FE) MG tablet Take 325 mg by mouth daily with breakfast.      . HYDROcodone-acetaminophen (NORCO) 5-325 MG per tablet Take 1 tablet by mouth every 6 (six) hours as needed.      Marland Kitchen ibuprofen (ADVIL,MOTRIN) 200 MG  tablet Take 400 mg by mouth 3 (three) times daily.      . insulin aspart (NOVOLOG) 100 UNIT/ML injection 3x a day (just before each meal) 30-25-30 units  1 vial  0  . ketoconazole (NIZORAL) 2 % shampoo Apply topically as needed.      . Melatonin 3 MG CAPS Take 3 mg by mouth at bedtime.      . metFORMIN (GLUCOPHAGE) 1000 MG tablet Take 1,000 mg by mouth 2 (two) times daily with a meal.      . Multiple Vitamin (DAILY VITE) TABS Take 1 tablet by mouth daily.      . mupirocin nasal ointment (BACTROBAN) 2 % Place 1 application into the nose 2 (two) times daily. Use one-half of tube in each nostril twice daily for five (5) days. After application, press sides of nose together and gently massage.      . nystatin-triamcinolone (MYCOLOG II) cream Apply 1 application topically 2 (two) times daily.      Marland Kitchen omeprazole (PRILOSEC) 20 MG capsule Take 20 mg by mouth daily.      . phenylephrine-shark liver oil-mineral oil-petrolatum (PREPARATION  H) 0.25-3-14-71.9 % rectal ointment Place 1 application rectally 4 (four) times daily as needed.      . simvastatin (ZOCOR) 40 MG tablet Take 40 mg by mouth every evening.      . sitaGLIPtin (JANUVIA) 100 MG tablet Take 100 mg by mouth daily.      Marland Kitchen DISCONTD: insulin glargine (LANTUS) 100 UNIT/ML injection Inject 25 Units into the skin at bedtime.  10 mL  0  . DISCONTD: zolpidem (AMBIEN) 5 MG tablet Take 5 mg by mouth at bedtime as needed.        Allergies  Allergen Reactions  . Penicillins Hives    Family History  Problem Relation Age of Onset  . Stroke Mother   . Heart attack Father   . Cancer Other     FH of Breast Cancer-other Relative  . Heart disease Other     Parent  . Hypertension Other     parent    BP 138/70  Pulse 79  Temp 98 F (36.7 C) (Oral)  Ht 5\' 6"  (1.676 m)  Wt 186 lb (84.369 kg)  BMI 30.02 kg/m2  SpO2 97%  Review of Systems denies hypoglycemia.      Objective:   Physical Exam VITAL SIGNS:  See vs page GENERAL: no  distress EXTEMITIES: no deformity.  no ulcer on the feet.  feet are of normal color and temp.  no edema PULSES: dorsalis pedis intact bilat.   NEURO:  cn 2-12 grossly intact.   readily moves all 4's.  sensation is intact to touch on the feet, but severely decreased from normal.   (pt says he had a1c done yesterday)    Assessment & Plan:  DM.  needs increased rx.

## 2012-06-14 ENCOUNTER — Ambulatory Visit: Payer: Medicare Other | Admitting: Endocrinology

## 2012-06-16 ENCOUNTER — Other Ambulatory Visit: Payer: Medicare Other | Admitting: Lab

## 2012-06-16 ENCOUNTER — Ambulatory Visit: Payer: Medicare Other | Admitting: Endocrinology

## 2012-06-16 ENCOUNTER — Ambulatory Visit: Payer: Medicare Other | Admitting: Oncology

## 2012-07-18 ENCOUNTER — Ambulatory Visit (INDEPENDENT_AMBULATORY_CARE_PROVIDER_SITE_OTHER): Payer: Medicare Other | Admitting: Endocrinology

## 2012-07-18 ENCOUNTER — Encounter: Payer: Self-pay | Admitting: Endocrinology

## 2012-07-18 VITALS — BP 140/62 | HR 81 | Temp 97.4°F | Resp 16 | Wt 187.0 lb

## 2012-07-18 DIAGNOSIS — E1149 Type 2 diabetes mellitus with other diabetic neurological complication: Secondary | ICD-10-CM

## 2012-07-18 DIAGNOSIS — E1049 Type 1 diabetes mellitus with other diabetic neurological complication: Secondary | ICD-10-CM

## 2012-07-18 DIAGNOSIS — E1142 Type 2 diabetes mellitus with diabetic polyneuropathy: Secondary | ICD-10-CM

## 2012-07-18 NOTE — Patient Instructions (Addendum)
Please check resident's blood sugar 4 times a day--before the 3 meals, and at bedtime.  also check if you have symptoms of your blood sugar being too high or too low.  please keep a record of the readings and bring it to your next appointment here.  please call us sooner if reident's blood sugar goes below 70, or if it stays over 200.  increase lantus to 45 units at bedtime, and:  continue novolog 3x a day (just before each meal) breakfast: 30; lunch: 25; and supper: 30 units.  Please come back for a follow-up appointment in 6 weeks.   i'll request your blood test results.

## 2012-07-18 NOTE — Progress Notes (Signed)
Subjective:    Patient ID: [name], male    DOB: [birth year], 66 y.o.   MRN: [id number]  HPI Pt returns for f/u of insulin-requiring DM (dx'ed 1983, complicated by peripheral sensory neuropathy, retinopathy, and CAD; he live at [facility]).  he brings a record of his cbg's which i have reviewed today.  It varies from 110-300, but most are in the 100's.  It is highest in am, and before lunch. Past Medical History  Diagnosis Date  . Hyperglycemia   . Hyponatremia   . DMII (diabetes mellitus, type 2)   . Depression   . IBS (irritable bowel syndrome)   . Chest pain   . Unspecified gastritis and gastroduodenitis without mention of hemorrhage   . Arthropathy, unspecified, site unspecified   . History of colon polyps   . Anemia, unspecified   . Anxiety   . GERD (gastroesophageal reflux disease)   . Hyperlipidemia   . Hypertension     Past Surgical History  Procedure Date  . Hernia repair 1964    History   Social History  . Marital Status: Divorced    Spouse Name: N/A    Number of Children: N/A  . Years of Education: N/A   Occupational History  . Not on file.   Social History Main Topics  . Smoking status: Never Smoker   . Smokeless tobacco: Not on file  . Alcohol Use: No  . Drug Use: No  . Sexually Active: Not on file   Other Topics Concern  . Not on file   Social History Narrative  . No narrative on file    Current Outpatient Prescriptions on File Prior to Visit  Medication Sig Dispense Refill  . aspirin 81 MG tablet Take 81 mg by mouth daily.      Marland Kitchen buPROPion (WELLBUTRIN) 75 MG tablet Take 75 mg by mouth daily.      . Cholecalciferol (VITAMIN D3) 1000 UNITS CAPS Take 1 capsule by mouth daily.      . fenofibrate 160 MG tablet Take 160 mg by mouth daily.      . ferrous sulfate 325 (65 FE) MG tablet Take 325 mg by mouth daily with breakfast.      . HYDROcodone-acetaminophen (NORCO) 5-325 MG per tablet Take 1 tablet by mouth every 6 (six)  hours as needed.      Marland Kitchen ibuprofen (ADVIL,MOTRIN) 200 MG tablet Take 400 mg by mouth 3 (three) times daily.      . insulin aspart (NOVOLOG) 100 UNIT/ML injection 3x a day (just before each meal) 30-25-30 units      . insulin glargine (LANTUS) 100 UNIT/ML injection Inject 45 Units into the skin at bedtime.       Marland Kitchen ketoconazole (NIZORAL) 2 % shampoo Apply topically as needed.      . Melatonin 3 MG CAPS Take 3 mg by mouth at bedtime.      . metFORMIN (GLUCOPHAGE) 1000 MG tablet Take 1,000 mg by mouth 2 (two) times daily with a meal.      . Multiple Vitamin (DAILY VITE) TABS Take 1 tablet by mouth daily.      . mupirocin nasal ointment (BACTROBAN) 2 % Place 1 application into the nose 2 (two) times daily. Use one-half of tube in each nostril twice daily for five (5) days. After application, press sides of nose together and gently massage.      . nystatin-triamcinolone (MYCOLOG II) cream Apply 1 application topically 2 (two) times daily.      Marland Kitchen  omeprazole (PRILOSEC) 20 MG capsule Take 20 mg by mouth daily.      . phenylephrine-shark liver oil-mineral oil-petrolatum (PREPARATION H) 0.25-3-14-71.9 % rectal ointment Place 1 application rectally 4 (four) times daily as needed.      . sertraline (ZOLOFT) 25 MG tablet Take 25 mg by mouth daily.      . simvastatin (ZOCOR) 40 MG tablet Take 40 mg by mouth every evening.      . sitaGLIPtin (JANUVIA) 100 MG tablet Take 100 mg by mouth daily.      Marland Kitchen DISCONTD: zolpidem (AMBIEN) 5 MG tablet Take 5 mg by mouth at bedtime as needed.        Allergies  Allergen Reactions  . Penicillins Hives    Family History  Problem Relation Age of Onset  . Stroke Mother   . Heart attack Father   . Cancer Other     FH of Breast Cancer-other Relative  . Heart disease Other     Parent  . Hypertension Other     parent    BP 140/62  Pulse 81  Temp 97.4 F (36.3 C) (Oral)  Resp 16  Wt 187 lb (84.823 kg)  SpO2 97%  Review of Systems denies hypoglycemia.        Objective:   Physical Exam VITAL SIGNS:  See vs page GENERAL: no distress SKIN:  Insulin injection sites at the triceps areas are normal.     Assessment & Plan:  DM.  needs increased rx

## 2012-08-29 ENCOUNTER — Encounter: Payer: Self-pay | Admitting: Endocrinology

## 2012-08-29 ENCOUNTER — Ambulatory Visit (INDEPENDENT_AMBULATORY_CARE_PROVIDER_SITE_OTHER): Payer: Medicare Other | Admitting: Endocrinology

## 2012-08-29 VITALS — BP 134/70 | HR 77 | Temp 97.1°F | Ht 64.0 in | Wt 189.0 lb

## 2012-08-29 DIAGNOSIS — E1142 Type 2 diabetes mellitus with diabetic polyneuropathy: Secondary | ICD-10-CM

## 2012-08-29 DIAGNOSIS — E1149 Type 2 diabetes mellitus with other diabetic neurological complication: Secondary | ICD-10-CM

## 2012-08-29 DIAGNOSIS — E1065 Type 1 diabetes mellitus with hyperglycemia: Secondary | ICD-10-CM

## 2012-08-29 DIAGNOSIS — E1049 Type 1 diabetes mellitus with other diabetic neurological complication: Secondary | ICD-10-CM

## 2012-08-29 NOTE — Progress Notes (Signed)
Subjective:    Patient ID: [name], male    DOB: 12/11/1946, 66 y.o.   MRN: [id number]  HPI Pt returns for f/u of insulin-requiring DM (dx'ed 1983, complicated by peripheral sensory neuropathy, retinopathy, and CAD; he lives at [facility]).  he brings a record of his cbg's which i have reviewed today.  It varies from 130-300, but most are in the 100's.  It is highest before lunch, and in the afternoon. Past Medical History  Diagnosis Date  . Hyperglycemia   . Hyponatremia   . DMII (diabetes mellitus, type 2)   . Depression   . IBS (irritable bowel syndrome)   . Chest pain   . Unspecified gastritis and gastroduodenitis without mention of hemorrhage   . Arthropathy, unspecified, site unspecified   . History of colon polyps   . Anemia, unspecified   . Anxiety   . GERD (gastroesophageal reflux disease)   . Hyperlipidemia   . Hypertension     Past Surgical History  Procedure Date  . Hernia repair 1964    History   Social History  . Marital Status: Divorced    Spouse Name: N/A    Number of Children: N/A  . Years of Education: N/A   Occupational History  . Not on file.   Social History Main Topics  . Smoking status: Never Smoker   . Smokeless tobacco: Not on file  . Alcohol Use: No  . Drug Use: No  . Sexually Active: Not on file   Other Topics Concern  . Not on file   Social History Narrative  . No narrative on file    Current Outpatient Prescriptions on File Prior to Visit  Medication Sig Dispense Refill  . aspirin 81 MG tablet Take 81 mg by mouth daily.      Marland Kitchen buPROPion (WELLBUTRIN) 75 MG tablet Take 75 mg by mouth daily.      . Cholecalciferol (VITAMIN D3) 1000 UNITS CAPS Take 1 capsule by mouth daily.      . fenofibrate 160 MG tablet Take 160 mg by mouth daily.      . ferrous sulfate 325 (65 FE) MG tablet Take 325 mg by mouth daily with breakfast.      . HYDROcodone-acetaminophen (NORCO) 5-325 MG per tablet Take 1 tablet by mouth every  6 (six) hours as needed.      Marland Kitchen ibuprofen (ADVIL,MOTRIN) 200 MG tablet Take 400 mg by mouth 3 (three) times daily.      . insulin aspart (NOVOLOG) 100 UNIT/ML injection 3x a day (just before each meal) breakfast: 40; lunch: 30; and supper: 30 units.      . insulin glargine (LANTUS) 100 UNIT/ML injection Inject 45 Units into the skin at bedtime.       Marland Kitchen ketoconazole (NIZORAL) 2 % shampoo Apply topically as needed.      . Melatonin 3 MG CAPS Take 6 mg by mouth at bedtime.       . Multiple Vitamin (DAILY VITE) TABS Take 1 tablet by mouth daily.      Marland Kitchen nystatin-triamcinolone (MYCOLOG II) cream Apply 1 application topically 2 (two) times daily.      Marland Kitchen omeprazole (PRILOSEC) 20 MG capsule Take 20 mg by mouth daily.      . sertraline (ZOLOFT) 25 MG tablet Take 25 mg by mouth daily.      . simvastatin (ZOCOR) 40 MG tablet Take 40 mg by mouth every evening.      Marland Kitchen DISCONTD: zolpidem (  AMBIEN) 5 MG tablet Take 5 mg by mouth at bedtime as needed.        Allergies  Allergen Reactions  . Penicillins Hives    Family History  Problem Relation Age of Onset  . Stroke Mother   . Heart attack Father   . Cancer Other     FH of Breast Cancer-other Relative  . Heart disease Other     Parent  . Hypertension Other     parent    BP 134/70  Pulse 77  Temp 97.1 F (36.2 C) (Oral)  Ht 5\' 4"  (1.626 m)  Wt 189 lb (85.73 kg)  BMI 32.44 kg/m2  SpO2 95%   Review of Systems denies hypoglycemia.      Objective:   Physical Exam VITAL SIGNS:  See vs page GENERAL: no distress EXTEMITIES:(sees podiatry)     Assessment & Plan:  DM, with apparently improved control

## 2012-08-29 NOTE — Patient Instructions (Addendum)
Please check resident's blood sugar 4 times a day--before the 3 meals, and at bedtime.  also check if you have symptoms of your blood sugar being too high or too low.  please keep a record of the readings and bring it to your next appointment here.  please call us sooner if reident's blood sugar goes below 70, or if it stays over 200.  continue lantus, 45 units at bedtime, and:  continue novolog 3x a day (just before each meal) breakfast: 40; lunch: 30; and supper: 30 units.  Please come back for a follow-up appointment in 6 weeks.   blood tests are being requested for you today.  You will receive a letter with results. Stop januvia and metformin.

## 2012-08-30 ENCOUNTER — Other Ambulatory Visit: Payer: Self-pay | Admitting: Gastroenterology

## 2012-08-30 DIAGNOSIS — K769 Liver disease, unspecified: Secondary | ICD-10-CM

## 2012-09-05 ENCOUNTER — Other Ambulatory Visit: Payer: Medicare Other

## 2012-09-11 ENCOUNTER — Telehealth: Payer: Self-pay | Admitting: Endocrinology

## 2012-09-11 NOTE — Telephone Encounter (Signed)
Caller: [name]/Sibling; Patient Name: [name]; PCP: Rom[facility] Belling (Adults only); [facility] Callback Phone Number: [phone]. Patient saw Dr. Everardo All on 08/29/12 and was given instructions to stop taking Metformin and Januvia and to increase the dosage of Novolog. These orders were carried out at the facility. Patient had increased blood sugar levels on 09/04/12 and the facility called Dr. Redmond School which is the physician for the facility where the patient lives. He instructed for the patient to begin taking Metformin again. Since that time the blood sugar levels have been stable. Facility: [facility] Phone: [phone]. Denies symptoms for triage at this time. Caller wants to make sure that patient being on Metformin is ok with Dr. Everardo All or if there are any new orders that need to be carried out at this time.

## 2012-09-12 NOTE — Telephone Encounter (Signed)
Increase novolog to continue novolog 3x a day (just before each meal) breakfast: 50; lunch: 40; and supper: 30 units.

## 2012-09-13 NOTE — Telephone Encounter (Signed)
Pharmacy Tech at [facility] notified.

## 2012-10-03 ENCOUNTER — Other Ambulatory Visit: Payer: Self-pay | Admitting: General Practice

## 2012-10-03 MED ORDER — INSULIN ASPART 100 UNIT/ML ~~LOC~~ SOLN: SUBCUTANEOUS | Status: DC

## 2012-10-10 ENCOUNTER — Ambulatory Visit (INDEPENDENT_AMBULATORY_CARE_PROVIDER_SITE_OTHER): Payer: Medicare Other | Admitting: Endocrinology

## 2012-10-10 ENCOUNTER — Encounter: Payer: Self-pay | Admitting: Endocrinology

## 2012-10-10 ENCOUNTER — Ambulatory Visit: Payer: Medicare Other | Admitting: Endocrinology

## 2012-10-10 VITALS — BP 126/80 | HR 86 | Temp 98.4°F | Wt 191.0 lb

## 2012-10-10 DIAGNOSIS — E1049 Type 1 diabetes mellitus with other diabetic neurological complication: Secondary | ICD-10-CM

## 2012-10-10 NOTE — Progress Notes (Signed)
Subjective:    Patient ID: [name], male    DOB: 03/31/1946, 66 y.o.   MRN: [id number]  HPI Pt returns for f/u of insulin-requiring DM (dx'ed 1983, complicated by peripheral sensory neuropathy, retinopathy, and CAD; he lives at [facility]).  he brings a record of his cbg's which i have reviewed today.  It varies from 130-300, but most are in the 200's.  It is highest before lunch, and in the afternoon.  He eats a bedtime snack "most nights," because he gets hungry.  He has resumed the metformin.   Past Medical History  Diagnosis Date  . Hyperglycemia   . Hyponatremia   . DMII (diabetes mellitus, type 2)   . Depression   . IBS (irritable bowel syndrome)   . Chest pain   . Unspecified gastritis and gastroduodenitis without mention of hemorrhage   . Arthropathy, unspecified, site unspecified   . History of colon polyps   . Anemia, unspecified   . Anxiety   . GERD (gastroesophageal reflux disease)   . Hyperlipidemia   . Hypertension     Past Surgical History  Procedure Date  . Hernia repair 1964    History   Social History  . Marital Status: Divorced    Spouse Name: N/A    Number of Children: N/A  . Years of Education: N/A   Occupational History  . Not on file.   Social History Main Topics  . Smoking status: Never Smoker   . Smokeless tobacco: Not on file  . Alcohol Use: No  . Drug Use: No  . Sexually Active: Not on file   Other Topics Concern  . Not on file   Social History Narrative  . No narrative on file    Current Outpatient Prescriptions on File Prior to Visit  Medication Sig Dispense Refill  . aspirin 81 MG tablet Take 81 mg by mouth daily.      Marland Kitchen buPROPion (WELLBUTRIN) 75 MG tablet Take 75 mg by mouth daily.      . Cholecalciferol (VITAMIN D3) 1000 UNITS CAPS Take 1 capsule by mouth daily.      . fenofibrate 160 MG tablet Take 160 mg by mouth daily.      . ferrous sulfate 325 (65 FE) MG tablet Take 325 mg by mouth daily with  breakfast.      . HYDROcodone-acetaminophen (NORCO) 5-325 MG per tablet Take 1 tablet by mouth every 6 (six) hours as needed.      Marland Kitchen ibuprofen (ADVIL,MOTRIN) 200 MG tablet Take 400 mg by mouth 3 (three) times daily.      . insulin aspart (NOVOLOG) 100 UNIT/ML injection Inject 50 Units into the skin 3 (three) times daily before meals.      . insulin glargine (LANTUS) 100 UNIT/ML injection Inject 50 Units into the skin at bedtime.       Marland Kitchen ketoconazole (NIZORAL) 2 % shampoo Apply topically as needed.      . Melatonin 3 MG CAPS Take 6 mg by mouth at bedtime.       . Multiple Vitamin (DAILY VITE) TABS Take 1 tablet by mouth daily.      Marland Kitchen nystatin-triamcinolone (MYCOLOG II) cream Apply 1 application topically 2 (two) times daily.      Marland Kitchen omeprazole (PRILOSEC) 20 MG capsule Take 20 mg by mouth daily.      . sertraline (ZOLOFT) 25 MG tablet Take 25 mg by mouth daily.      . simvastatin (ZOCOR) 40  MG tablet Take 40 mg by mouth every evening.      . [DISCONTINUED] zolpidem (AMBIEN) 5 MG tablet Take 5 mg by mouth at bedtime as needed.        Allergies  Allergen Reactions  . Penicillins Hives    Family History  Problem Relation Age of Onset  . Stroke Mother   . Heart attack Father   . Cancer Other     FH of Breast Cancer-other Relative  . Heart disease Other     Parent  . Hypertension Other     parent   BP 126/80  Pulse 86  Temp 98.4 F (36.9 C) (Oral)  Wt 191 lb (86.637 kg)  SpO2 97%  Review of Systems denies hypoglycemia.      Objective:   Physical Exam VITAL SIGNS:  See vs page GENERAL: no distress Pulses: dorsalis pedis intact bilat.   Feet: no deformity.  no ulcer on the feet.  feet are of normal color and temp.  1+ bilat edema Neuro: sensation is intact to touch on the feet, but decreased from normal      Assessment & Plan:  DM: needs increased rx

## 2012-10-10 NOTE — Patient Instructions (Addendum)
Please check resident's blood sugar 4 times a day--before the 3 meals, and at bedtime.  also check if you have symptoms of your blood sugar being too high or too low.  please keep a record of the readings and bring it to your next appointment here.  please call us sooner if reident's blood sugar goes below 70, or if it stays over 200.  increase lantus to 50 units at bedtime, and:  increase novolog to 50 units 3x a day (just before each meal). Please come back for a follow-up appointment in 6 weeks.   Please send [place] recent labs, including a1c.

## 2012-10-19 ENCOUNTER — Telehealth: Payer: Self-pay | Admitting: Geriatric Medicine

## 2012-10-19 NOTE — Telephone Encounter (Signed)
i don't understand this message--are they calling to report an elevated cbg?

## 2012-10-19 NOTE — Telephone Encounter (Signed)
[facility] Triage Call Report Triage Record Num: [id number] Operator: Albertine Grates Patient Name: [name] Call Date & Time: 10/18/2012 10:14:43PM Patient Phone: [phone] PCP: Nolon Rod. Paz Patient Gender: Male PCP Fax : Patient DOB: 1946/06/17 Practice Name: [facility] Reason for Call: Caller: Dr Paz/Other; PCP: Willow Ora; CB#: [phone]; Call regarding Dr Drue Novel is requesting for a RN to fax an order to [place] at [facility]. . . Fax # [phone]. Orders: 35 units Novolog and re check in 2 hours. Call back # for [facility] is [phone].; Per order from Dr. Drue Novel, faxed order for Novolog 35 units now, recheck blood sugar in 2 hours to [facility]/[phone]. Protocol(s) Used: Office Note Recommended Outcome per Protocol: Information Noted and Sent to Office Reason for Outcome: Caller information to office Care Advice: ~

## 2012-10-19 NOTE — Telephone Encounter (Signed)
Please advise 

## 2012-10-19 NOTE — Telephone Encounter (Signed)
Will forward to Dr Ellison 

## 2012-10-24 ENCOUNTER — Ambulatory Visit: Payer: Self-pay | Admitting: Internal Medicine

## 2012-11-05 ENCOUNTER — Emergency Department (HOSPITAL_COMMUNITY): Payer: Medicare Other

## 2012-11-05 ENCOUNTER — Emergency Department (HOSPITAL_COMMUNITY)
Admission: EM | Admit: 2012-11-05 | Discharge: 2012-11-05 | Disposition: A | Discharge status: Home / Self Care | Payer: Medicare Other | Attending: Emergency Medicine | Admitting: Emergency Medicine

## 2012-11-05 ENCOUNTER — Encounter (HOSPITAL_COMMUNITY): Payer: Self-pay | Admitting: Emergency Medicine

## 2012-11-05 DIAGNOSIS — Z7982 Long term (current) use of aspirin: Secondary | ICD-10-CM | POA: Insufficient documentation

## 2012-11-05 DIAGNOSIS — E119 Type 2 diabetes mellitus without complications: Secondary | ICD-10-CM | POA: Insufficient documentation

## 2012-11-05 DIAGNOSIS — Y939 Activity, unspecified: Secondary | ICD-10-CM | POA: Insufficient documentation

## 2012-11-05 DIAGNOSIS — I10 Essential (primary) hypertension: Secondary | ICD-10-CM | POA: Insufficient documentation

## 2012-11-05 DIAGNOSIS — R079 Chest pain, unspecified: Secondary | ICD-10-CM | POA: Insufficient documentation

## 2012-11-05 DIAGNOSIS — Z860100 Personal history of colon polyps, unspecified: Secondary | ICD-10-CM | POA: Insufficient documentation

## 2012-11-05 DIAGNOSIS — E785 Hyperlipidemia, unspecified: Secondary | ICD-10-CM | POA: Insufficient documentation

## 2012-11-05 DIAGNOSIS — F411 Generalized anxiety disorder: Secondary | ICD-10-CM | POA: Insufficient documentation

## 2012-11-05 DIAGNOSIS — Z8739 Personal history of other diseases of the musculoskeletal system and connective tissue: Secondary | ICD-10-CM | POA: Insufficient documentation

## 2012-11-05 DIAGNOSIS — Z79899 Other long term (current) drug therapy: Secondary | ICD-10-CM | POA: Insufficient documentation

## 2012-11-05 DIAGNOSIS — Z862 Personal history of diseases of the blood and blood-forming organs and certain disorders involving the immune mechanism: Secondary | ICD-10-CM | POA: Insufficient documentation

## 2012-11-05 DIAGNOSIS — K219 Gastro-esophageal reflux disease without esophagitis: Secondary | ICD-10-CM | POA: Insufficient documentation

## 2012-11-05 DIAGNOSIS — S0990XA Unspecified injury of head, initial encounter: Secondary | ICD-10-CM | POA: Insufficient documentation

## 2012-11-05 DIAGNOSIS — F329 Major depressive disorder, single episode, unspecified: Secondary | ICD-10-CM | POA: Insufficient documentation

## 2012-11-05 DIAGNOSIS — R296 Repeated falls: Secondary | ICD-10-CM | POA: Insufficient documentation

## 2012-11-05 DIAGNOSIS — Y929 Unspecified place or not applicable: Secondary | ICD-10-CM | POA: Insufficient documentation

## 2012-11-05 DIAGNOSIS — Z794 Long term (current) use of insulin: Secondary | ICD-10-CM | POA: Insufficient documentation

## 2012-11-05 DIAGNOSIS — F3289 Other specified depressive episodes: Secondary | ICD-10-CM | POA: Insufficient documentation

## 2012-11-05 DIAGNOSIS — Z8719 Personal history of other diseases of the digestive system: Secondary | ICD-10-CM | POA: Insufficient documentation

## 2012-11-05 DIAGNOSIS — W19XXXA Unspecified fall, initial encounter: Secondary | ICD-10-CM

## 2012-11-05 LAB — COMPREHENSIVE METABOLIC PANEL
ALT: 41 U/L (ref 0–53)
Alkaline Phosphatase: 55 U/L (ref 39–117)
BUN: 24 mg/dL — ABNORMAL HIGH (ref 6–23)
CO2: 23 mEq/L (ref 19–32)
Chloride: 103 mEq/L (ref 96–112)
GFR calc Af Amer: 75 mL/min — ABNORMAL LOW (ref >90)
GFR calc non Af Amer: 64 mL/min — ABNORMAL LOW (ref >90)
Glucose, Bld: 167 mg/dL — ABNORMAL HIGH (ref 70–99)
Potassium: 4.3 mEq/L (ref 3.5–5.1)
Sodium: 135 mEq/L (ref 135–145)
Total Bilirubin: 0.3 mg/dL (ref 0.3–1.2)
Total Protein: 8.1 g/dL (ref 6.0–8.3)

## 2012-11-05 LAB — CBC WITH DIFFERENTIAL/PLATELET
Eosinophils Absolute: 0.1 10*3/uL (ref 0.0–0.7)
Hemoglobin: 9.5 g/dL — ABNORMAL LOW (ref 13.0–17.0)
Lymphocytes Relative: 25 % (ref 12–46)
Lymphs Abs: 0.7 10*3/uL (ref 0.7–4.0)
MCH: 29.4 pg (ref 26.0–34.0)
Monocytes Relative: 10 % (ref 3–12)
Neutro Abs: 1.7 10*3/uL (ref 1.7–7.7)
Neutrophils Relative %: 62 % (ref 43–77)
Platelets: 80 10*3/uL — ABNORMAL LOW (ref 150–400)
RBC: 3.23 MIL/uL — ABNORMAL LOW (ref 4.22–5.81)
WBC: 2.8 10*3/uL — ABNORMAL LOW (ref 4.0–10.5)

## 2012-11-05 LAB — TROPONIN I: Troponin I: <0.3 ng/mL (ref <0.30)

## 2012-11-05 MED ORDER — SODIUM CHLORIDE 0.9 % IV BOLUS (SEPSIS)
1000.0000 mL | Freq: Once | INTRAVENOUS | Status: AC
  Administered 2012-11-05: 1000 mL via INTRAVENOUS

## 2012-11-05 MED ORDER — HYDROCODONE-ACETAMINOPHEN 5-325 MG PO TABS: 2.0000 | ORAL_TABLET | ORAL | Status: DC | PRN

## 2012-11-05 NOTE — ED Provider Notes (Signed)
History     CSN: [id number]  Arrival date & time 11/05/12  0601   First MD Initiated Contact with Patient 11/05/12 [phone]      Chief Complaint  Patient presents with  . Fall    (Consider location/radiation/quality/duration/timing/severity/associated sxs/prior treatment) The history is provided by the patient.  [name] is a 66 y.o. male history of diabetes, depression, CAD here with syncope. He woke up at night to get some water. After he got up, he felt lightheaded and dizzy and fell backwards. He said that he might have passed out. Denies headaches, vomiting afterwards. + head injury. Denies fevers or cough or chest pain or abdominal pain.    Past Medical History  Diagnosis Date  . Hyperglycemia   . Hyponatremia   . DMII (diabetes mellitus, type 2)   . Depression   . IBS (irritable bowel syndrome)   . Chest pain   . Unspecified gastritis and gastroduodenitis without mention of hemorrhage   . Arthropathy, unspecified, site unspecified   . History of colon polyps   . Anemia, unspecified   . Anxiety   . GERD (gastroesophageal reflux disease)   . Hyperlipidemia   . Hypertension     Past Surgical History  Procedure Date  . Hernia repair 1964    Family History  Problem Relation Age of Onset  . Stroke Mother   . Heart attack Father   . Cancer Other     FH of Breast Cancer-other Relative  . Heart disease Other     Parent  . Hypertension Other     parent    History  Substance Use Topics  . Smoking status: Never Smoker   . Smokeless tobacco: Not on file  . Alcohol Use: No      Review of Systems  Cardiovascular: Positive for chest pain.  All other systems reviewed and are negative.    Allergies  Penicillins  Home Medications   Current Outpatient Rx  Name  Route  Sig  Dispense  Refill  . ASPIRIN 81 MG PO TABS   Oral   Take 81 mg by mouth daily.         . BUPROPION HCL 75 MG PO TABS   Oral   Take 75 mg by mouth daily.         Marland Kitchen  VITAMIN D3 1000 UNITS PO CAPS   Oral   Take 1 capsule by mouth daily.         . FENOFIBRATE 160 MG PO TABS   Oral   Take 160 mg by mouth daily.         Marland Kitchen FERROUS SULFATE 325 (65 FE) MG PO TABS   Oral   Take 325 mg by mouth daily with breakfast.         . HYDROCODONE-ACETAMINOPHEN 5-325 MG PO TABS   Oral   Take 1 tablet by mouth every 6 (six) hours as needed. For pain         . IBUPROFEN 200 MG PO TABS   Oral   Take 400 mg by mouth 3 (three) times daily.         . INSULIN ASPART 100 UNIT/ML Bowers SOLN   Subcutaneous   Inject 50 Units into the skin 3 (three) times daily before meals.         . INSULIN GLARGINE 100 UNIT/ML Benedict SOLN   Subcutaneous   Inject 50 Units into the skin at bedtime.          Marland Kitchen  KETOCONAZOLE 2 % EX SHAM   Topical   Apply 1 application topically at bedtime.          Marland Kitchen MELATONIN 3 MG PO CAPS   Oral   Take 6 mg by mouth at bedtime.          . DAILY VITE PO TABS   Oral   Take 1 tablet by mouth daily.         . NYSTATIN-TRIAMCINOLONE 100000-0.1 UNIT/GM-% EX CREA   Topical   Apply 1 application topically 2 (two) times daily.         Marland Kitchen OMEPRAZOLE 20 MG PO CPDR   Oral   Take 20 mg by mouth daily.         . SERTRALINE HCL 25 MG PO TABS   Oral   Take 25 mg by mouth daily.         Marland Kitchen SIMVASTATIN 40 MG PO TABS   Oral   Take 40 mg by mouth every evening.           BP 128/65  Pulse 80  Temp 98.7 F (37.1 C) (Oral)  Resp 17  SpO2 97%  Physical Exam  Nursing note and vitals reviewed. Constitutional: He is oriented to person, place, and time. He appears well-developed and well-nourished.  HENT:  Head: Normocephalic.  Mouth/Throat: Oropharynx is clear and moist.       Small bruise on posterior scalp and neck (neck wound 12 days old from previous fall)  Eyes: Conjunctivae normal are normal. Pupils are equal, round, and reactive to light.  Neck: Normal range of motion. Neck supple.       No midline tenderness, nl ROM of  neck   Cardiovascular: Normal rate, regular rhythm and normal heart sounds.   Pulmonary/Chest: Effort normal and breath sounds normal. No respiratory distress. He has no wheezes. He has no rales.  Abdominal: Soft. Bowel sounds are normal. He exhibits no distension. There is no tenderness. There is no rebound.  Musculoskeletal: Normal range of motion. He exhibits no edema.       R shoulder dec ROM (chronic)   Neurological: He is alert and oriented to person, place, and time.  Skin: Skin is warm and dry.  Psychiatric: He has a normal mood and affect. His behavior is normal. Judgment and thought content normal.    ED Course  Procedures (including critical care time)  Labs Reviewed  CBC WITH DIFFERENTIAL - Abnormal; Notable for the following:    WBC 2.8 (*)     RBC 3.23 (*)     Hemoglobin 9.5 (*)     HCT 28.6 (*)     Platelets 80 (*)  CONSISTENT WITH PREVIOUS RESULT   All other components within normal limits  COMPREHENSIVE METABOLIC PANEL - Abnormal; Notable for the following:    Glucose, Bld 167 (*)     BUN 24 (*)     Albumin 3.2 (*)     AST 62 (*)     GFR calc non Af Amer 64 (*)     GFR calc Af Amer 75 (*)     All other components within normal limits  TROPONIN I   Ct Head Wo Contrast  11/05/2012  *RADIOLOGY REPORT*  Clinical Data: Fall, head injury  CT HEAD WITHOUT CONTRAST  Technique:  Contiguous axial images were obtained from the base of the skull through the vertex without contrast.  Comparison: 09/25/2009  Findings: Stable mild brain atrophy without acute intracranial hemorrhage, mass lesion,  infarction, midline shift, herniation, hydrocephalus, or extra-axial fluid collection.  Gray-white matter differentiation maintained.  Cisterns patent.  No cerebellar abnormality.  Symmetric orbits.  Mastoid sinuses clear.  IMPRESSION: Stable atrophy without acute intracranial process   Original Report Authenticated By: Judie Petit. Miles Costain, M.D.      No diagnosis found.   Date: 11/05/2012   Rate: 74  Rhythm: normal sinus rhythm  QRS Axis: normal  Intervals: normal  ST/T Wave abnormalities: nonspecific ST changes  Conduction Disutrbances:none  Narrative Interpretation:   Old EKG Reviewed: unchanged    MDM  [name] is a 66 y.o. male here with syncope, head injury. Will get orthostatics. Unlikely to be cardiac but will get EKG, trop x 1. Will do CT head given small scalp bruises. Will reassess.   9:12 AM Patient not orthostatic. CT head nl. Labs nl, trop neg x 1 and low suspicion for ACS. Patient hydrated with IVF and felt better and will d/c home.        Richardean Canal, MD 11/05/12 [phone]

## 2012-11-05 NOTE — ED Notes (Signed)
Pt complaint of fall.Complains of pain the rt shoulder and left arm .Old bruises in the occipital area.

## 2012-11-05 NOTE — ED Notes (Signed)
Attempted to call Nursing home called two numbers both answering machine. Report given to [facility] and discharge paper work.

## 2012-11-05 NOTE — ED Notes (Signed)
Patient transported to CT 

## 2012-11-05 NOTE — ED Notes (Signed)
Pt brought to ED by EMS with history of fall.Pt woke up to drink water and fell as he got out of bed.No loss of unconsciousness CBG 285.[name]

## 2012-11-13 ENCOUNTER — Encounter: Payer: Self-pay | Admitting: Endocrinology

## 2012-11-13 ENCOUNTER — Ambulatory Visit (INDEPENDENT_AMBULATORY_CARE_PROVIDER_SITE_OTHER): Payer: Medicare Other | Admitting: Endocrinology

## 2012-11-13 VITALS — BP 130/76 | HR 81 | Temp 98.0°F | Wt 195.0 lb

## 2012-11-13 DIAGNOSIS — E118 Type 2 diabetes mellitus with unspecified complications: Secondary | ICD-10-CM | POA: Insufficient documentation

## 2012-11-13 DIAGNOSIS — E1065 Type 1 diabetes mellitus with hyperglycemia: Secondary | ICD-10-CM

## 2012-11-13 DIAGNOSIS — E1049 Type 1 diabetes mellitus with other diabetic neurological complication: Secondary | ICD-10-CM

## 2012-11-13 DIAGNOSIS — E119 Type 2 diabetes mellitus without complications: Secondary | ICD-10-CM

## 2012-11-13 NOTE — Progress Notes (Signed)
Subjective:    Patient ID: [name], male    DOB: 03-03-1946, 66 y.o.   MRN: [id number]  HPI Pt returns for f/u of insulin-requiring DM (dx'ed 1983, complicated by peripheral sensory neuropathy, retinopathy, and CAD; he lives at [facility]).  he brings a record of his cbg's which i have reviewed today.  It varies from 76-300, but most are 100-200's.  It is highest before lunch, and in the afternoon.  It is most consistently high in am.  He eats a bedtime snack approx once a week, because he gets hungry. It is most consistently high in am.   Past Medical History  Diagnosis Date  . Hyperglycemia   . Hyponatremia   . DMII (diabetes mellitus, type 2)   . Depression   . IBS (irritable bowel syndrome)   . Chest pain   . Unspecified gastritis and gastroduodenitis without mention of hemorrhage   . Arthropathy, unspecified, site unspecified   . History of colon polyps   . Anemia, unspecified   . Anxiety   . GERD (gastroesophageal reflux disease)   . Hyperlipidemia   . Hypertension     Past Surgical History  Procedure Date  . Hernia repair 1964    History   Social History  . Marital Status: Divorced    Spouse Name: N/A    Number of Children: N/A  . Years of Education: N/A   Occupational History  . Not on file.   Social History Main Topics  . Smoking status: Never Smoker   . Smokeless tobacco: Not on file  . Alcohol Use: No  . Drug Use: No  . Sexually Active: Not on file   Other Topics Concern  . Not on file   Social History Narrative  . No narrative on file    Current Outpatient Prescriptions on File Prior to Visit  Medication Sig Dispense Refill  . aspirin 81 MG tablet Take 81 mg by mouth daily.      Marland Kitchen buPROPion (WELLBUTRIN) 75 MG tablet Take 75 mg by mouth daily.      . Cholecalciferol (VITAMIN D3) 1000 UNITS CAPS Take 1 capsule by mouth daily.      . fenofibrate 160 MG tablet Take 160 mg by mouth daily.      . ferrous sulfate 325 (65 FE) MG  tablet Take 325 mg by mouth daily with breakfast.      . HYDROcodone-acetaminophen (NORCO) 5-325 MG per tablet Take 1 tablet by mouth every 6 (six) hours as needed. For pain      . HYDROcodone-acetaminophen (NORCO) 5-325 MG per tablet Take 2 tablets by mouth every 4 (four) hours as needed for pain.  10 tablet  0  . ibuprofen (ADVIL,MOTRIN) 200 MG tablet Take 400 mg by mouth 3 (three) times daily.      . insulin glargine (LANTUS) 100 UNIT/ML injection Inject 60 Units into the skin at bedtime.       Marland Kitchen ketoconazole (NIZORAL) 2 % shampoo Apply 1 application topically at bedtime.       . Melatonin 3 MG CAPS Take 6 mg by mouth at bedtime.       . Multiple Vitamin (DAILY VITE) TABS Take 1 tablet by mouth daily.      Marland Kitchen nystatin-triamcinolone (MYCOLOG II) cream Apply 1 application topically 2 (two) times daily.      Marland Kitchen omeprazole (PRILOSEC) 20 MG capsule Take 20 mg by mouth daily.      . sertraline (ZOLOFT) 25 MG  tablet Take 25 mg by mouth daily.      . simvastatin (ZOCOR) 40 MG tablet Take 40 mg by mouth every evening.      . insulin aspart (NOVOLOG) 100 UNIT/ML injection 3 times a day (just before each meal) 50-50-40 units.  5 vial  6  . [DISCONTINUED] zolpidem (AMBIEN) 5 MG tablet Take 5 mg by mouth at bedtime as needed.        Allergies  Allergen Reactions  . Penicillins Hives    Family History  Problem Relation Age of Onset  . Stroke Mother   . Heart attack Father   . Cancer Other     FH of Breast Cancer-other Relative  . Heart disease Other     Parent  . Hypertension Other     parent    BP 130/76  Pulse 81  Temp 98 F (36.7 C) (Oral)  Wt 195 lb (88.451 kg)  SpO2 94%  Review of Systems denies hypoglycemia    Objective:   Physical Exam VITAL SIGNS:  See vs page GENERAL: no distress SKIN:  Insulin injection sites at the triceps areas are normal  Lab Results  Component Value Date   HGBA1C 7.6* 11/13/2012      Assessment & Plan:  DM: The pattern of his cbg's indicates he  needs some adjustment in his therapy

## 2012-11-13 NOTE — Patient Instructions (Addendum)
Please check resident's blood sugar 4 times a day--before the 3 meals, and at bedtime.  also check if you have symptoms of your blood sugar being too high or too low.  please keep a record of the readings and bring it to your next appointment here.  please call us sooner if reident's blood sugar goes below 70, or if it stays over 200.  increase lantus to 60 units at bedtime, and:  decrease novolog to 3x a day (just before each meal) 50 units with breakfast, 50 units with lunch, and 40 units with your evening meal.   Please come back for a follow-up appointment in 3 months.

## 2012-11-14 ENCOUNTER — Other Ambulatory Visit: Payer: Self-pay | Admitting: Neurology

## 2012-11-14 MED ORDER — INSULIN ASPART 100 UNIT/ML ~~LOC~~ SOLN: SUBCUTANEOUS | Status: DC

## 2012-11-29 ENCOUNTER — Encounter (INDEPENDENT_AMBULATORY_CARE_PROVIDER_SITE_OTHER): Payer: Self-pay | Admitting: General Surgery

## 2012-11-29 ENCOUNTER — Encounter (INDEPENDENT_AMBULATORY_CARE_PROVIDER_SITE_OTHER): Payer: Self-pay

## 2012-11-29 ENCOUNTER — Ambulatory Visit (INDEPENDENT_AMBULATORY_CARE_PROVIDER_SITE_OTHER): Payer: Medicare Other | Admitting: General Surgery

## 2012-11-29 VITALS — BP 144/62 | HR 68 | Temp 97.5°F | Resp 16 | Ht 66.5 in | Wt 188.8 lb

## 2012-11-29 DIAGNOSIS — L290 Pruritus ani: Secondary | ICD-10-CM

## 2012-11-29 DIAGNOSIS — K644 Residual hemorrhoidal skin tags: Secondary | ICD-10-CM

## 2012-11-29 DIAGNOSIS — K648 Other hemorrhoids: Secondary | ICD-10-CM

## 2012-11-29 NOTE — Progress Notes (Signed)
Patient ID: [name], male   DOB: [birth year], 66 y.o.   MRN: [id number]  Chief Complaint  Patient presents with  . New Evaluation    eval hems    HPI [name] is a 66 y.o. male.   HPI 66 yo WM referred by Dr Matthias Hughs for evaluation of hemorrhoids. The patient states that he has had hemorrhoidal problems for many years. He states that they started during his [employer]. He states that he has a bowel movement every day. He generally sits on the commode for about 30 minutes at a time. He does state that he does strain. He drinks 8 glasses of water a day. His main complaint is a lot of itching and scratching. He states that he does a lot of scratching that it actually causes his skin to break down. He thinks that he may also be causing the area to bleed some by him scratching so much. He said recently bran cereal every morning but does not take a fiber supplement. He generally uses a baby wipe and wipe after having a bowel movement. He also uses a washcloth and soap in the shower to clean himself. He states that he drinks a lot of soda. He states that he will have pain when having a bowel movement. He describes it as a throbbing sensation. The discomfort will last for about 30 minutes and then resolve. The blood is generally on the toilet tissue. He has had a colonoscopy and a flexible sigmoidoscopy in the past year. He states that he had excision of a rectal cyst a few years ago at the [place] in [facility]. He states that he had the procedure done because they thought the lesion was malignant but it turned out to be benign Past Medical History  Diagnosis Date  . Hyperglycemia   . Hyponatremia   . DMII (diabetes mellitus, type 2)   . Depression   . IBS (irritable bowel syndrome)   . Chest pain   . Unspecified gastritis and gastroduodenitis without mention of hemorrhage   . Arthropathy, unspecified, site unspecified   . History of colon polyps   . Anemia, unspecified   .  Anxiety   . GERD (gastroesophageal reflux disease)   . Hyperlipidemia   . Hypertension     Past Surgical History  Procedure Date  . Hernia repair 1964    Family History  Problem Relation Age of Onset  . Stroke Mother   . Heart attack Father   . Heart disease Father   . Cancer Other     FH of Breast Cancer-other Relative  . Heart disease Other     Parent  . Hypertension Other     parent  . Cancer Maternal Uncle     colon    Social History History  Substance Use Topics  . Smoking status: Former Games developer  . Smokeless tobacco: Not on file  . Alcohol Use: No    Allergies  Allergen Reactions  . Penicillins Hives    Current Outpatient Prescriptions  Medication Sig Dispense Refill  . aspirin 81 MG tablet Take 81 mg by mouth daily.      Marland Kitchen buPROPion (WELLBUTRIN) 75 MG tablet Take 75 mg by mouth daily.      . Cholecalciferol (VITAMIN D3) 1000 UNITS CAPS Take 1 capsule by mouth daily.      . fenofibrate 160 MG tablet Take 160 mg by mouth daily.      . ferrous sulfate 325 (  65 FE) MG tablet Take 325 mg by mouth daily with breakfast.      . HYDROcodone-acetaminophen (NORCO) 5-325 MG per tablet Take 1 tablet by mouth every 6 (six) hours as needed. For pain      . HYDROcodone-acetaminophen (NORCO) 5-325 MG per tablet Take 2 tablets by mouth every 4 (four) hours as needed for pain.  10 tablet  0  . ibuprofen (ADVIL,MOTRIN) 200 MG tablet Take 400 mg by mouth 3 (three) times daily.      . insulin aspart (NOVOLOG) 100 UNIT/ML injection 3 times a day (just before each meal) 50-50-40 units.  5 vial  6  . insulin glargine (LANTUS) 100 UNIT/ML injection Inject 60 Units into the skin at bedtime.       Marland Kitchen ketoconazole (NIZORAL) 2 % shampoo Apply 1 application topically at bedtime.       . Melatonin 3 MG CAPS Take 6 mg by mouth at bedtime.       . Multiple Vitamin (DAILY VITE) TABS Take 1 tablet by mouth daily.      Marland Kitchen nystatin-triamcinolone (MYCOLOG II) cream Apply 1 application topically 2  (two) times daily.      Marland Kitchen omeprazole (PRILOSEC) 20 MG capsule Take 20 mg by mouth daily.      . sertraline (ZOLOFT) 25 MG tablet Take 25 mg by mouth daily.      . simvastatin (ZOCOR) 40 MG tablet Take 40 mg by mouth every evening.      . divalproex (DEPAKOTE) 125 MG DR tablet       . triamcinolone cream (KENALOG) 0.1 %       . [DISCONTINUED] zolpidem (AMBIEN) 5 MG tablet Take 5 mg by mouth at bedtime as needed.        Review of Systems Review of Systems  Constitutional: Negative for fever, chills, activity change, appetite change and unexpected weight change.       Recent falls  HENT: Negative for congestion and trouble swallowing.   Eyes: Negative for visual disturbance.  Respiratory: Negative for chest tightness and shortness of breath.   Cardiovascular: Negative for chest pain and leg swelling.       No PND, no orthopnea, no DOE  Gastrointestinal:       See HPI  Genitourinary: Negative for dysuria and hematuria.  Musculoskeletal: Negative.   Skin: Negative for rash.  Neurological: Negative for seizures and speech difficulty.  Hematological: Does not bruise/bleed easily.  Psychiatric/Behavioral: Negative for behavioral problems and confusion.    Blood pressure 144/62, pulse 68, temperature 97.5 F (36.4 C), temperature source Temporal, resp. rate 16, height 5' 6.5" (1.689 m), weight 188 lb 12.8 oz (85.639 kg).  Physical Exam Physical Exam  Vitals reviewed. Constitutional: He appears well-developed and well-nourished. No distress.       Uses walker  HENT:  Head: Normocephalic and atraumatic.  Right Ear: External ear normal.  Left Ear: External ear normal.  Eyes: Conjunctivae normal are normal. No scleral icterus.  Neck: No tracheal deviation present.  Cardiovascular: Normal rate, regular rhythm and normal heart sounds.   Pulmonary/Chest: Effort normal and breath sounds normal. No stridor. No respiratory distress. He has no wheezes.  Abdominal: Soft. Bowel sounds are  normal. He exhibits no distension. There is no tenderness. There is no rebound.       Small reducible umbilical hernia  Genitourinary:       Has several small areas of about 2-3 mm of skin excoriation on perianal skin. Has enlarged (non-thrombosed) circumferential  external hemorrhoids which appear to have chronically thickened skin which is hypopigmented. DRE - good tone. Anoscopy - 3 column internal hemorrhoids. In post midline about 2cm from anal verge - ulcerated hemorrhoid vs rectal ulcer  Musculoskeletal: He exhibits no edema.  Neurological: He is alert. He exhibits normal muscle tone.  Skin: Skin is warm and dry. He is not diaphoretic.  Psychiatric: He has a normal mood and affect. His behavior is normal.    Data Reviewed Dr Buccini's notes  Colonoscopy report 07/28/12 a few diverticuli in the sigmoid colon. The exam was otherwise normal throughout the colon. Internal hemorrhoids were found during retroflexion and during endoscopy and were grade 1 Flexible sigmoidoscopy 05/18/2012-a few small mouth diverticula in the sigmoid colon. Mild - moderate int hemorrhoids  Assessment    Three column Internal/external hemorrhoids Pruritus ani DM Cirrhosis Depression    Plan    His internal hemorrhoids are not that large. I did see evidence of a small mucosal defect in the posterior midline about 1-1/2-2 cm from the anal verge. It is possible he is bleeding from this area as well. He also has really thickened external hemorrhoidal tissue. To me this is more consistent with chronic pruritus ani.  We discussed the etiology of hemorrhoids. The patient was given educational material as well as diagrams. We discussed nonoperative and operative management of hemorrhoidal disease.  We discussed the importance of having a daily soft bowel movement and avoiding constipation. We also discussed good bowel habits such as not reading in the bathroom, not straining, and drinking 6-8 glasses of water per  day. We also discussed the importance of a high fiber diet. We discussed foods that were high in fiber as well as fiber supplements. We discussed the importance of trying to get 25-30 g of fiber per day in their diet. We discussed the need to start with a low dose of fiber and then gradually increasing their daily fiber dose over several weeks in order to avoid bloating and cramping.  We also discussed pruritus ani. He was also given Agricultural engineer. We discussed hygiene practices & we also discussed food choices that may limit his anal itching  I am not sure that a surgical hemorrhoidectomy or even hemorrhoidal banding will ameliorate his perianal itching. I have recommended starting with nonoperative management first as described above for both hemorrhoidal disease as well as pruritus ani. He was instructed to gradually start a high fiber diet consisting of over-the-counter fiber supplement. I encouraged him to continue to use wet wipes. I encouraged him to avoid using soaps and wiping vigorously. We also discussed foods to avoid. I also encouraged him to refrain from sitting on the commode for more than 10 minutes at a time. If he is still symptomatic after about 6 weeks to 8 weeks of nonoperative management then we will entertain exam under anesthesia.   F/u 6-8 weeks.  Mary Sella. Andrey Campanile, MD, FACS General, Bariatric, & Minimally Invasive Surgery [facility], [place]         [facility] M 11/29/2012, 11:10 AM

## 2012-11-29 NOTE — Patient Instructions (Signed)
GETTING TO GOOD BOWEL HEALTH. Irregular bowel habits such as constipation and diarrhea can lead to many problems over time.  Having one soft bowel movement a day is the most important way to prevent further problems.  The anorectal canal is designed to handle stretching and feces to safely manage our ability to get rid of solid waste (feces, poop, stool) out of our body.  BUT, hard constipated stools can act like ripping concrete bricks and diarrhea can be a burning fire to this very sensitive area of our body, causing inflamed hemorrhoids, anal fissures, increasing risk is perirectal abscesses, abdominal pain/bloating, an making irritable bowel worse.     The goal: ONE SOFT BOWEL MOVEMENT A DAY!  To have soft, regular bowel movements:    Drink at least 8 tall glasses of water a day.     Do not sit on commode for more than 10 minutes at a time   Start taking a fiber powder supplement on a daily basis - see below - start with a low dose and gradually increase over 2 weeks in order to avoid bloating and cramping   Take plenty of fiber.  Fiber is the undigested part of plant food that passes into the colon, acting s "natures broom" to encourage bowel motility and movement.  Fiber can absorb and hold large amounts of water. This results in a larger, bulkier stool, which is soft and easier to pass. Work gradually over several weeks up to 6 servings a day of fiber (25g a day even more if needed) in the form of: o Vegetables -- Root (potatoes, carrots, turnips), leafy green (lettuce, salad greens, celery, spinach), or cooked high residue (cabbage, broccoli, etc) o Fruit -- Fresh (unpeeled skin & pulp), Dried (prunes, apricots, cherries, etc ),  or stewed ( applesauce)  o Whole grain breads, pasta, etc (whole wheat)  o Bran cereals    Bulking Agents -- This type of water-retaining fiber generally is easily obtained each day by one of the following:  o Psyllium bran -- The psyllium plant is remarkable because its  ground seeds can retain so much water. This product is available as Metamucil, Konsyl, Effersyllium, Per Diem Fiber, or the less expensive generic preparation in drug and health food stores. Although labeled a laxative, it really is not a laxative.  o Methylcellulose -- This is another fiber derived from wood which also retains water. It is available as Citrucel. o Polyethylene Glycol - and "artificial" fiber commonly called Miralax or Glycolax.  It is helpful for people with gassy or bloated feelings with regular fiber o Flax Seed - a less gassy fiber than psyllium   No reading or other relaxing activity while on the toilet. If bowel movements take longer than 5 minutes, you are too constipated   AVOID CONSTIPATION.  High fiber and water intake usually takes care of this.  Sometimes a laxative is needed to stimulate more frequent bowel movements, but    Laxatives are not a good long-term solution as it can wear the colon out. o Osmotics (Milk of Magnesia, Fleets phosphosoda, Magnesium citrate, MiraLax, GoLytely) are safer than  o Stimulants (Senokot, Castor Oil, Dulcolax, Ex Lax)    o Do not take laxatives for more than 7days in a row.    IF SEVERELY CONSTIPATED, try a Bowel Retraining Program: o Do not use laxatives.  o Eat a diet high in roughage, such as bran cereals and leafy vegetables.  o Drink six (6) ounces of prune  or apricot juice each morning.  o Eat two (2) large servings of stewed fruit each day.  o Take one (1) heaping tablespoon of a psyllium-based bulking agent twice a day. Use sugar-free sweetener when possible to avoid excessive calories.  o Eat a normal breakfast.  o Set aside 15 minutes after breakfast to sit on the toilet, but do not strain to have a bowel movement.  o If you do not have a bowel movement by the third day, use an enema and repeat the above steps.  o  o   Anal Pruritus Anal pruritus is an itching of the anus, which is often due to increased moisture of the  skin around the anus. Moisture may be due to sweating or a small amount of remaining stool. The itching and scratching can cause further skin damage.  CAUSES   Poor hygiene.  Excessive moisture from sweating or residual stool in the anal area.  Perfumed soaps and sprays and colored toilet paper.  Chemicals in the foods you eat.  Dietary factors such as caffeine, beer, milk products, chocolate, nuts, citrus fruits, tomatoes, spicy seasonings, jalapeno peppers, and salsa.  Hemorrhoids, infections, and other anal diseases.  Excessive washing.  Overuse of laxatives.  Skin disorders (psoriasis, eczema, or seborrhea). HOME CARE INSTRUCTIONS   Practice good hygiene.  Clean the anal area gently with wet toilet paper, baby wipes, or a wet washcloth after every bowel movement and at bedtime. Avoid using soaps on the anal area. Dry the area thoroughly. Pat the area dry with toilet paper or a towel.  Do not scrub the anal area with anything, even toilet paper.  Try not to scratch the itchy area. Scratching produces more damage, which makes the itching worse.  Take sitz baths in warm water for 15 to 20 minutes, 2 to 3 times a day. Pat the area dry with a soft cloth after each bath.  Zinc oxide ointment or a moisture barrier cream can be applied several times daily to protect the skin.  Only take medicines as directed by your caregiver.  Talk to your caregiver about fiber supplements. These are helpful in normalizing the stool if you have frequent loose stools.  Wear cotton underwear and loose clothing.  Do not use irritants such as bubble baths, scented toilet paper, or genital deodorants. SEEK MEDICAL CARE IF:   Itching does not improve in several days or gets worse.  You have a fever.  There are problems with increased pain, swelling, or redness. MAKE SURE YOU:   Understand these instructions.  Will watch your condition.  Will get help right away if you are not doing well  or get worse. Document Released: 05/31/2011 Document Revised: 02/21/2012 Document Reviewed: 05/31/2011 [facility] Patient Information 2013 [place], [place].

## 2012-12-26 ENCOUNTER — Telehealth (INDEPENDENT_AMBULATORY_CARE_PROVIDER_SITE_OTHER): Payer: Self-pay | Admitting: General Surgery

## 2012-12-26 NOTE — Telephone Encounter (Signed)
Request for records from [facility] sent to them at [phone] attn medical records on 11/29/2013 and again on 12/26/2012. Requesting H and P and OP note from rectal procedure performed there. Awaiting records.

## 2013-01-05 ENCOUNTER — Other Ambulatory Visit: Payer: Self-pay

## 2013-01-05 MED ORDER — INSULIN GLARGINE 100 UNIT/ML ~~LOC~~ SOLN: 60.0000 [IU] | Freq: Every day | SUBCUTANEOUS | Status: DC

## 2013-01-08 ENCOUNTER — Ambulatory Visit (INDEPENDENT_AMBULATORY_CARE_PROVIDER_SITE_OTHER): Payer: Medicare Other | Admitting: Endocrinology

## 2013-01-08 VITALS — BP 126/80 | HR 83 | Temp 98.6°F | Wt 196.0 lb

## 2013-01-08 DIAGNOSIS — E1049 Type 1 diabetes mellitus with other diabetic neurological complication: Secondary | ICD-10-CM

## 2013-01-08 NOTE — Patient Instructions (Addendum)
Please check resident's blood sugar 4 times a day--before the 3 meals, and at bedtime.  also check if you have symptoms of your blood sugar being too high or too low.  please keep a record of the readings and bring it to your next appointment here.  please call us sooner if reident's blood sugar goes below 70, or if it stays over 200.  increase lantus to 70 units at bedtime, and:  continue novolog, 3x a day (just before each meal) 50 units with breakfast, 50 units with lunch, and 40 units with your evening meal.   Please come back for a follow-up appointment in 2 months. Try to minimize eating after your evening meal.

## 2013-01-08 NOTE — Progress Notes (Signed)
Subjective:    Patient ID: [name], male    DOB: 09-23-1946, 67 y.o.   MRN: [id number]  HPI Pt returns for f/u of insulin-requiring DM (dx'ed 1983, complicated by peripheral sensory neuropathy, retinopathy, and CAD; he lives at "[facility]).  he brings a record of his cbg's which i have reviewed today.  He had an episode of cbg of 500, 2 weeks ago.  This happened before breakfast, but he doesn't know why this was. He says this might have been due to overdoing a bedtime snack.  He says he knows there is no medical reason to eat a bedtime snack, but he gets hungry.  cbg's vary from 112-500, but most are 100's-200's.  It is highest at hs, and in am.   Past Medical History  Diagnosis Date  . Hyperglycemia   . Hyponatremia   . DMII (diabetes mellitus, type 2)   . Depression   . IBS (irritable bowel syndrome)   . Chest pain   . Unspecified gastritis and gastroduodenitis without mention of hemorrhage   . Arthropathy, unspecified, site unspecified   . History of colon polyps   . Anemia, unspecified   . Anxiety   . GERD (gastroesophageal reflux disease)   . Hyperlipidemia   . Hypertension     Past Surgical History  Procedure Date  . Hernia repair 1964    History   Social History  . Marital Status: Divorced    Spouse Name: N/A    Number of Children: N/A  . Years of Education: N/A   Occupational History  . Not on file.   Social History Main Topics  . Smoking status: Former Games developer  . Smokeless tobacco: Not on file  . Alcohol Use: No  . Drug Use: No  . Sexually Active: Not on file   Other Topics Concern  . Not on file   Social History Narrative  . No narrative on file    Current Outpatient Prescriptions on File Prior to Visit  Medication Sig Dispense Refill  . aspirin 81 MG tablet Take 81 mg by mouth daily.      Marland Kitchen buPROPion (WELLBUTRIN) 75 MG tablet Take 75 mg by mouth daily.      . Cholecalciferol (VITAMIN D3) 1000 UNITS CAPS Take 1 capsule by mouth  daily.      . divalproex (DEPAKOTE) 125 MG DR tablet       . fenofibrate 160 MG tablet Take 160 mg by mouth daily.      . ferrous sulfate 325 (65 FE) MG tablet Take 325 mg by mouth daily with breakfast.      . HYDROcodone-acetaminophen (NORCO) 5-325 MG per tablet Take 1 tablet by mouth every 6 (six) hours as needed. For pain      . HYDROcodone-acetaminophen (NORCO) 5-325 MG per tablet Take 2 tablets by mouth every 4 (four) hours as needed for pain.  10 tablet  0  . ibuprofen (ADVIL,MOTRIN) 200 MG tablet Take 400 mg by mouth 3 (three) times daily.      . insulin aspart (NOVOLOG) 100 UNIT/ML injection 3 times a day (just before each meal) 50-50-40 units.  5 vial  6  . insulin glargine (LANTUS) 100 UNIT/ML injection Inject 70 Units into the skin at bedtime.      Marland Kitchen ketoconazole (NIZORAL) 2 % shampoo Apply 1 application topically at bedtime.       . Melatonin 3 MG CAPS Take 6 mg by mouth at bedtime.       Marland Kitchen  Multiple Vitamin (DAILY VITE) TABS Take 1 tablet by mouth daily.      Marland Kitchen nystatin-triamcinolone (MYCOLOG II) cream Apply 1 application topically 2 (two) times daily.      Marland Kitchen omeprazole (PRILOSEC) 20 MG capsule Take 20 mg by mouth daily.      . sertraline (ZOLOFT) 25 MG tablet Take 25 mg by mouth daily.      . simvastatin (ZOCOR) 40 MG tablet Take 40 mg by mouth every evening.      . triamcinolone cream (KENALOG) 0.1 %       . [DISCONTINUED] zolpidem (AMBIEN) 5 MG tablet Take 5 mg by mouth at bedtime as needed.        Allergies  Allergen Reactions  . Penicillins Hives    Family History  Problem Relation Age of Onset  . Stroke Mother   . Heart attack Father   . Heart disease Father   . Cancer Other     FH of Breast Cancer-other Relative  . Heart disease Other     Parent  . Hypertension Other     parent  . Cancer Maternal Uncle     colon    BP 126/80  Pulse 83  Temp 98.6 F (37 C) (Oral)  Wt 196 lb (88.905 kg)  SpO2 98%  Review of Systems denies hypoglycemia    Objective:     Physical Exam VITAL SIGNS:  See vs page GENERAL: no distress Pulses: dorsalis pedis intact bilat.   Feet: no deformity.  no ulcer on the feet.  feet are of normal color and temp.  1+ bilat edema Neuro: sensation is intact to touch on the feet, but decreased from normal.     Assessment & Plan:  DM: needs increased rx

## 2013-01-17 ENCOUNTER — Encounter (INDEPENDENT_AMBULATORY_CARE_PROVIDER_SITE_OTHER): Payer: Medicare Other | Admitting: General Surgery

## 2013-01-25 ENCOUNTER — Ambulatory Visit (HOSPITAL_COMMUNITY): Payer: Medicare Other

## 2013-02-15 ENCOUNTER — Telehealth: Payer: Self-pay | Admitting: *Deleted

## 2013-02-15 NOTE — Telephone Encounter (Signed)
Nurse, Gabriel Rung , called from [facility], to report Blood Sugar on patient. Before meals at 6 am BS 350: 12 noon BS 269. Please advise. CB# [phone]

## 2013-02-15 NOTE — Telephone Encounter (Signed)
Ov is due 

## 2013-02-15 NOTE — Telephone Encounter (Signed)
[facility] notified of need for patient to make Office Visit with Dr, Everardo All for Diabetes and Blood Sugar check. spoke with [facility] regarding this who will give information to transportation to set up appt.Marland Kitchen

## 2013-02-20 ENCOUNTER — Ambulatory Visit: Payer: Medicare Other | Admitting: Endocrinology

## 2013-02-21 ENCOUNTER — Emergency Department (HOSPITAL_COMMUNITY)
Admission: EM | Admit: 2013-02-21 | Discharge: 2013-02-21 | Disposition: A | Discharge status: Skilled Nursing Facility | Payer: Medicare Other | Attending: Emergency Medicine | Admitting: Emergency Medicine

## 2013-02-21 ENCOUNTER — Encounter (HOSPITAL_COMMUNITY): Payer: Self-pay | Admitting: Emergency Medicine

## 2013-02-21 DIAGNOSIS — Z794 Long term (current) use of insulin: Secondary | ICD-10-CM | POA: Insufficient documentation

## 2013-02-21 DIAGNOSIS — E119 Type 2 diabetes mellitus without complications: Secondary | ICD-10-CM | POA: Insufficient documentation

## 2013-02-21 DIAGNOSIS — K219 Gastro-esophageal reflux disease without esophagitis: Secondary | ICD-10-CM | POA: Insufficient documentation

## 2013-02-21 DIAGNOSIS — D649 Anemia, unspecified: Secondary | ICD-10-CM | POA: Insufficient documentation

## 2013-02-21 DIAGNOSIS — Z8719 Personal history of other diseases of the digestive system: Secondary | ICD-10-CM | POA: Insufficient documentation

## 2013-02-21 DIAGNOSIS — I10 Essential (primary) hypertension: Secondary | ICD-10-CM | POA: Insufficient documentation

## 2013-02-21 DIAGNOSIS — Z8679 Personal history of other diseases of the circulatory system: Secondary | ICD-10-CM | POA: Insufficient documentation

## 2013-02-21 DIAGNOSIS — E860 Dehydration: Secondary | ICD-10-CM | POA: Insufficient documentation

## 2013-02-21 DIAGNOSIS — K589 Irritable bowel syndrome without diarrhea: Secondary | ICD-10-CM | POA: Insufficient documentation

## 2013-02-21 DIAGNOSIS — Z87891 Personal history of nicotine dependence: Secondary | ICD-10-CM | POA: Insufficient documentation

## 2013-02-21 DIAGNOSIS — Z862 Personal history of diseases of the blood and blood-forming organs and certain disorders involving the immune mechanism: Secondary | ICD-10-CM | POA: Insufficient documentation

## 2013-02-21 DIAGNOSIS — F3289 Other specified depressive episodes: Secondary | ICD-10-CM | POA: Insufficient documentation

## 2013-02-21 DIAGNOSIS — R197 Diarrhea, unspecified: Secondary | ICD-10-CM | POA: Insufficient documentation

## 2013-02-21 DIAGNOSIS — Z79899 Other long term (current) drug therapy: Secondary | ICD-10-CM | POA: Insufficient documentation

## 2013-02-21 DIAGNOSIS — Z7982 Long term (current) use of aspirin: Secondary | ICD-10-CM | POA: Insufficient documentation

## 2013-02-21 DIAGNOSIS — E785 Hyperlipidemia, unspecified: Secondary | ICD-10-CM | POA: Insufficient documentation

## 2013-02-21 DIAGNOSIS — M129 Arthropathy, unspecified: Secondary | ICD-10-CM | POA: Insufficient documentation

## 2013-02-21 LAB — CBC WITH DIFFERENTIAL/PLATELET
Basophils Absolute: 0 10*3/uL (ref 0.0–0.1)
Basophils Relative: 0 % (ref 0–1)
Eosinophils Absolute: 0 10*3/uL (ref 0.0–0.7)
Eosinophils Relative: 1 % (ref 0–5)
HCT: 28.7 % — ABNORMAL LOW (ref 39.0–52.0)
Hemoglobin: 9.6 g/dL — ABNORMAL LOW (ref 13.0–17.0)
Lymphocytes Relative: 13 % (ref 12–46)
Lymphs Abs: 0.4 10*3/uL — ABNORMAL LOW (ref 0.7–4.0)
MCH: 29.7 pg (ref 26.0–34.0)
MCHC: 33.4 g/dL (ref 30.0–36.0)
MCV: 88.9 fL (ref 78.0–100.0)
Monocytes Absolute: 0.2 10*3/uL (ref 0.1–1.0)
Monocytes Relative: 8 % (ref 3–12)
Neutro Abs: 2.1 10*3/uL (ref 1.7–7.7)
Neutrophils Relative %: 77 % (ref 43–77)
Platelets: 78 10*3/uL — ABNORMAL LOW (ref 150–400)
RBC: 3.23 MIL/uL — ABNORMAL LOW (ref 4.22–5.81)
RDW: 14.8 % (ref 11.5–15.5)
WBC: 2.8 10*3/uL — ABNORMAL LOW (ref 4.0–10.5)

## 2013-02-21 LAB — COMPREHENSIVE METABOLIC PANEL
BUN: 27 mg/dL — ABNORMAL HIGH (ref 6–23)
CO2: 24 mEq/L (ref 19–32)
Calcium: 9.3 mg/dL (ref 8.4–10.5)
Creatinine, Ser: 1.06 mg/dL (ref 0.50–1.35)
GFR calc Af Amer: 83 mL/min — ABNORMAL LOW (ref >90)
GFR calc non Af Amer: 72 mL/min — ABNORMAL LOW (ref >90)
Glucose, Bld: 140 mg/dL — ABNORMAL HIGH (ref 70–99)
Sodium: 138 mEq/L (ref 135–145)
Total Protein: 8.6 g/dL — ABNORMAL HIGH (ref 6.0–8.3)

## 2013-02-21 LAB — URINALYSIS, ROUTINE W REFLEX MICROSCOPIC
Bilirubin Urine: NEGATIVE
Leukocytes, UA: NEGATIVE
Nitrite: NEGATIVE
Specific Gravity, Urine: 1.013 (ref 1.005–1.030)
Urobilinogen, UA: 0.2 mg/dL (ref 0.0–1.0)
pH: 6.5 (ref 5.0–8.0)

## 2013-02-21 MED ORDER — SODIUM CHLORIDE 0.9 % IV BOLUS (SEPSIS)
1000.0000 mL | Freq: Once | INTRAVENOUS | Status: AC
  Administered 2013-02-21: 1000 mL via INTRAVENOUS

## 2013-02-21 MED ORDER — ACETAMINOPHEN 325 MG PO TABS
650.0000 mg | ORAL_TABLET | Freq: Once | ORAL | Status: AC
  Administered 2013-02-21: 650 mg via ORAL
  Filled 2013-02-21: qty 2

## 2013-02-21 NOTE — ED Provider Notes (Addendum)
History     CSN: [id number]  Arrival date & time 02/21/13  1221   First MD Initiated Contact with Patient 02/21/13 1223      Chief Complaint  Patient presents with  . Fatigue    (Consider location/radiation/quality/duration/timing/severity/associated sxs/prior treatment) Patient is a 67 y.o. male presenting with diarrhea. The history is provided by the patient.  Diarrhea Quality:  Watery Severity:  Moderate Onset quality:  Sudden Number of episodes:  5 Duration:  12 hours Timing:  Constant Progression:  Resolved Relieved by:  Nothing Worsened by:  Nothing tried Associated symptoms: headaches and myalgias   Associated symptoms: no abdominal pain, no chills, no recent cough, no fever and no vomiting   Risk factors: recent antibiotic use and sick contacts   Risk factors comment:  Currently on doxycycline for skin   Past Medical History  Diagnosis Date  . Hyperglycemia   . Hyponatremia   . DMII (diabetes mellitus, type 2)   . Depression   . IBS (irritable bowel syndrome)   . Chest pain   . Unspecified gastritis and gastroduodenitis without mention of hemorrhage   . Arthropathy, unspecified, site unspecified   . History of colon polyps   . Anemia, unspecified   . Anxiety   . GERD (gastroesophageal reflux disease)   . Hyperlipidemia   . Hypertension     Past Surgical History  Procedure Laterality Date  . Hernia repair  1964    Family History  Problem Relation Age of Onset  . Stroke Mother   . Heart attack Father   . Heart disease Father   . Cancer Other     FH of Breast Cancer-other Relative  . Heart disease Other     Parent  . Hypertension Other     parent  . Cancer Maternal Uncle     colon    History  Substance Use Topics  . Smoking status: Former Games developer  . Smokeless tobacco: Not on file  . Alcohol Use: No      Review of Systems  Constitutional: Negative for fever and chills.  Gastrointestinal: Positive for diarrhea. Negative for vomiting  and abdominal pain.  Musculoskeletal: Positive for myalgias.  Neurological: Positive for headaches.  All other systems reviewed and are negative.    Allergies  Penicillins  Home Medications   Current Outpatient Rx  Name  Route  Sig  Dispense  Refill  . aspirin 81 MG chewable tablet   Oral   Chew 81 mg by mouth daily.         Marland Kitchen buPROPion (WELLBUTRIN) 75 MG tablet   Oral   Take 75 mg by mouth daily.         . Cholecalciferol (VITAMIN D3) 1000 UNITS CAPS   Oral   Take 1 capsule by mouth daily.         . divalproex (DEPAKOTE) 125 MG DR tablet   Oral   Take 125 mg by mouth every morning.         Marland Kitchen doxycycline (VIBRA-TABS) 100 MG tablet   Oral   Take 100 mg by mouth 2 (two) times daily. For 10 days Started 02/20/2013         . fenofibrate 160 MG tablet   Oral   Take 160 mg by mouth daily.         . ferrous sulfate 325 (65 FE) MG tablet   Oral   Take 325 mg by mouth daily with breakfast.         .  ibuprofen (ADVIL,MOTRIN) 200 MG tablet   Oral   Take 400 mg by mouth 3 (three) times daily.         . insulin aspart (NOVOLOG) 100 UNIT/ML injection   Subcutaneous   Inject 40-50 Units into the skin 3 (three) times daily with meals. 50 units with breakfast 50 units with lunch 40 units with supper         . insulin glargine (LANTUS) 100 UNIT/ML injection   Subcutaneous   Inject 70 Units into the skin at bedtime.         . meclizine (ANTIVERT) 25 MG tablet   Oral   Take 25 mg by mouth every 12 (twelve) hours as needed for dizziness.         . Melatonin 3 MG CAPS   Oral   Take 6 mg by mouth at bedtime.          . Multiple Vitamin (DAILY VITE) TABS   Oral   Take 1 tablet by mouth daily.         Marland Kitchen nystatin cream (MYCOSTATIN)   Topical   Apply 1 application topically 3 (three) times daily.         Marland Kitchen nystatin-triamcinolone (MYCOLOG II) cream   Topical   Apply 1 application topically 2 (two) times daily.         Marland Kitchen omeprazole  (PRILOSEC) 20 MG capsule   Oral   Take 20 mg by mouth daily.         Marland Kitchen oxyCODONE-acetaminophen (PERCOCET/ROXICET) 5-325 MG per tablet   Oral   Take 1 tablet by mouth every 6 (six) hours as needed for pain.         Marland Kitchen sertraline (ZOLOFT) 25 MG tablet   Oral   Take 25 mg by mouth daily.         . simvastatin (ZOCOR) 40 MG tablet   Oral   Take 40 mg by mouth every evening.         . Zinc Oxide (DESITIN CREAMY EX)   Apply externally   Apply 1 Package topically 4 (four) times daily as needed (itching/irritation).           BP 144/74  Pulse 81  Temp(Src) 97.4 F (36.3 C)  Resp 18  SpO2 98%  Physical Exam  Nursing note and vitals reviewed. Constitutional: He is oriented to person, place, and time. He appears well-developed and well-nourished. No distress.  HENT:  Head: Normocephalic and atraumatic.  Mouth/Throat: Oropharynx is clear and moist.  Eyes: Conjunctivae and EOM are normal. Pupils are equal, round, and reactive to light.  Neck: Normal range of motion. Neck supple.  Cardiovascular: Normal rate, regular rhythm and intact distal pulses.   No murmur heard. Pulmonary/Chest: Effort normal and breath sounds normal. No respiratory distress. He has no wheezes. He has no rales.  Abdominal: Soft. He exhibits no distension. There is no tenderness. There is no rebound and no guarding.  Musculoskeletal: Normal range of motion. He exhibits no edema and no tenderness.  Neurological: He is alert and oriented to person, place, and time.  Skin: Skin is warm and dry. No rash noted. No erythema.  Psychiatric: He has a normal mood and affect. His behavior is normal.    ED Course  Procedures (including critical care time)  Labs Reviewed  CBC WITH DIFFERENTIAL - Abnormal; Notable for the following:    WBC 2.8 (*)    RBC 3.23 (*)    Hemoglobin 9.6 (*)  HCT 28.7 (*)    Lymphs Abs 0.4 (*)    All other components within normal limits  COMPREHENSIVE METABOLIC PANEL -  Abnormal; Notable for the following:    Potassium 5.5 (*)    Glucose, Bld 140 (*)    BUN 27 (*)    Total Protein 8.6 (*)    Albumin 3.4 (*)    AST 59 (*)    GFR calc non Af Amer 72 (*)    GFR calc Af Amer 83 (*)    All other components within normal limits  URINALYSIS, ROUTINE W REFLEX MICROSCOPIC   No results found.   1. Diarrhea   2. Dehydration       MDM   Patient with generalized malaise and fatigue that started yesterday. States he had 5 episodes of diarrhea that is now resolved. He has normal vital signs and is well appearing here. He states and illness has been going around his assisted living where people are having diarrhea chills and aches. He states he is on antibiotics for recent skin infection however feel that if this was C. difficile the diarrhea were discontinued. The patient is on doxycycline which makes this less likely.  Feel was likely patient has a viral illness and diarrhea has caused mild dehydration. He was able to eat this morning but his appetite was decreased. He denies any nausea vomiting or abdominal pain. CBC, CMP, UA pending. Patient given IV fluids.  2:27 PM Pt with normal labs.  Feeling better after IVF and ate here without difficulty.  Will d/c home.       Gwyneth Sprout, MD 02/21/13 1428  Gwyneth Sprout, MD 02/21/13 1559  Gwyneth Sprout, MD 02/21/13 1601

## 2013-02-21 NOTE — ED Notes (Signed)
Pt from [facility]. States ILI has been circulating at the home. C/o generalized pain/fatigue. Hx DM.

## 2013-02-23 ENCOUNTER — Encounter: Payer: Self-pay | Admitting: Endocrinology

## 2013-02-23 ENCOUNTER — Ambulatory Visit (INDEPENDENT_AMBULATORY_CARE_PROVIDER_SITE_OTHER): Payer: Medicare Other | Admitting: Endocrinology

## 2013-02-23 VITALS — BP 126/70 | HR 77 | Wt 192.0 lb

## 2013-02-23 LAB — MICROALBUMIN / CREATININE URINE RATIO
Creatinine,U: 89.1 mg/dL
Microalb, Ur: 0.2 mg/dL (ref 0.0–1.9)

## 2013-02-23 NOTE — Patient Instructions (Signed)
Please check resident's blood sugar 4 times a day--before the 3 meals, and at bedtime.  also check if you have symptoms of your blood sugar being too high or too low.  please keep a record of the readings and bring it to your next appointment here.  please call us sooner if reident's blood sugar goes below 70, or if it stays over 200.  Please continue the same insulin.  Please come back for a follow-up appointment in 3 months.

## 2013-02-23 NOTE — Progress Notes (Signed)
Subjective:    Patient ID: [name], male    DOB: 1945/12/19, 67 y.o.   MRN: [id number]  HPI Pt returns for f/u of insulin-requiring DM (dx'ed 1983, complicated by peripheral sensory neuropathy, retinopathy, and CAD; he lives at "[facility]").  he brings a record of his cbg's which i have reviewed today.  It varies from 81-313, but most are in the 100's.  He was seen in the ER for mild hypoglycemia a few days ago, when he had diarrhea.  Pt says he occasionally has diarrhea anyway, and that this was not out of the ordinary.  Past Medical History  Diagnosis Date  . Hyperglycemia   . Hyponatremia   . DMII (diabetes mellitus, type 2)   . Depression   . IBS (irritable bowel syndrome)   . Chest pain   . Unspecified gastritis and gastroduodenitis without mention of hemorrhage   . Arthropathy, unspecified, site unspecified   . History of colon polyps   . Anemia, unspecified   . Anxiety   . GERD (gastroesophageal reflux disease)   . Hyperlipidemia   . Hypertension     Past Surgical History  Procedure Laterality Date  . Hernia repair  1964    History   Social History  . Marital Status: Divorced    Spouse Name: N/A    Number of Children: N/A  . Years of Education: N/A   Occupational History  . Not on file.   Social History Main Topics  . Smoking status: Former Games developer  . Smokeless tobacco: Not on file  . Alcohol Use: No  . Drug Use: No  . Sexually Active: Not on file   Other Topics Concern  . Not on file   Social History Narrative  . No narrative on file    Current Outpatient Prescriptions on File Prior to Visit  Medication Sig Dispense Refill  . aspirin 81 MG chewable tablet Chew 81 mg by mouth daily.      Marland Kitchen buPROPion (WELLBUTRIN) 75 MG tablet Take 75 mg by mouth daily.      . Cholecalciferol (VITAMIN D3) 1000 UNITS CAPS Take 1 capsule by mouth daily.      . divalproex (DEPAKOTE) 125 MG DR tablet Take 125 mg by mouth every morning.      Marland Kitchen doxycycline  (VIBRA-TABS) 100 MG tablet Take 100 mg by mouth 2 (two) times daily. For 10 days Started 02/20/2013      . fenofibrate 160 MG tablet Take 160 mg by mouth daily.      . ferrous sulfate 325 (65 FE) MG tablet Take 325 mg by mouth daily with breakfast.      . ibuprofen (ADVIL,MOTRIN) 200 MG tablet Take 400 mg by mouth 3 (three) times daily.      . insulin aspart (NOVOLOG) 100 UNIT/ML injection Inject 40-50 Units into the skin 3 (three) times daily with meals. 50 units with breakfast 50 units with lunch 40 units with supper      . insulin glargine (LANTUS) 100 UNIT/ML injection Inject 70 Units into the skin at bedtime.      . meclizine (ANTIVERT) 25 MG tablet Take 25 mg by mouth every 12 (twelve) hours as needed for dizziness.      . Melatonin 3 MG CAPS Take 6 mg by mouth at bedtime.       . Multiple Vitamin (DAILY VITE) TABS Take 1 tablet by mouth daily.      Marland Kitchen nystatin cream (MYCOSTATIN) Apply 1 application topically  3 (three) times daily.      Marland Kitchen nystatin-triamcinolone (MYCOLOG II) cream Apply 1 application topically 2 (two) times daily.      Marland Kitchen omeprazole (PRILOSEC) 20 MG capsule Take 20 mg by mouth daily.      Marland Kitchen oxyCODONE-acetaminophen (PERCOCET/ROXICET) 5-325 MG per tablet Take 1 tablet by mouth every 6 (six) hours as needed for pain.      Marland Kitchen sertraline (ZOLOFT) 25 MG tablet Take 25 mg by mouth daily.      . simvastatin (ZOCOR) 40 MG tablet Take 40 mg by mouth every evening.      . Zinc Oxide (DESITIN CREAMY EX) Apply 1 Package topically 4 (four) times daily as needed (itching/irritation).      . [DISCONTINUED] zolpidem (AMBIEN) 5 MG tablet Take 5 mg by mouth at bedtime as needed.       No current facility-administered medications on file prior to visit.    Allergies  Allergen Reactions  . Penicillins Hives    Family History  Problem Relation Age of Onset  . Stroke Mother   . Heart attack Father   . Heart disease Father   . Cancer Other     FH of Breast Cancer-other Relative  . Heart  disease Other     Parent  . Hypertension Other     parent  . Cancer Maternal Uncle     colon    BP 126/70  Pulse 77  Wt 192 lb (87.091 kg)  BMI 30.53 kg/m2  SpO2 95%  Review of Systems Denies LOC    Objective:   Physical Exam VITAL SIGNS:  See vs page GENERAL: no distress Gait: normal and steady.   Lab Results  Component Value Date   HGBA1C 7.5* 02/23/2013      Assessment & Plan:  DM: this is the best control this pt should aim for, given variable cbg's

## 2013-02-27 ENCOUNTER — Telehealth: Payer: Self-pay

## 2013-02-27 NOTE — Telephone Encounter (Signed)
[place] advised per Dr. George Hugh message and states an understanding

## 2013-02-27 NOTE — Telephone Encounter (Signed)
Please continue the same insulin.  

## 2013-02-27 NOTE — Telephone Encounter (Signed)
[place] from [facility] called [phone] pt's blood sugar this am before breakfast was 369 and after was 486

## 2013-03-08 ENCOUNTER — Ambulatory Visit: Payer: Medicare Other | Admitting: Endocrinology

## 2013-04-05 ENCOUNTER — Encounter (INDEPENDENT_AMBULATORY_CARE_PROVIDER_SITE_OTHER): Payer: Self-pay | Admitting: Geriatric Medicine

## 2013-04-09 ENCOUNTER — Encounter: Payer: Self-pay | Admitting: Endocrinology

## 2013-04-27 ENCOUNTER — Telehealth: Payer: Self-pay | Admitting: Endocrinology

## 2013-04-27 NOTE — Telephone Encounter (Signed)
Please call [facility] Increase novolog to 3 times a day (just before each meal) 50 units with breakfast, 50 units with lunch, and 40 units with supper Increase lantus to 80 units qhs.

## 2013-04-27 NOTE — Telephone Encounter (Signed)
Maxine Glenn advised will fax order to [phone]

## 2013-06-01 ENCOUNTER — Ambulatory Visit: Payer: Medicare Other | Admitting: Endocrinology

## 2013-06-05 ENCOUNTER — Telehealth: Payer: Self-pay

## 2013-06-05 NOTE — Telephone Encounter (Signed)
Helmut Muster advised will fax order to [phone]

## 2013-06-05 NOTE — Telephone Encounter (Signed)
Decrease insulin to 40 units 3 times a day (just before each meal). Ov is overdue.  Ov this week.

## 2013-06-05 NOTE — Telephone Encounter (Signed)
[name] with [facility] called pt's cbg at 6 am was 90, pt was lethargic and sweating at 8 am, cbg was then 53, then cbg dropped to 48 was given oj and peanut buttercbg came up to 105, would like to change am insulin to later time,please advise

## 2013-06-08 ENCOUNTER — Telehealth: Payer: Self-pay | Admitting: *Deleted

## 2013-06-08 NOTE — Telephone Encounter (Signed)
Lauren with [facility] called and lvm stating that pt's bg after lunch was 313, she gave him his insulin. They checked his bg prior to lunch and it decreased to 179. Be advised.

## 2013-06-08 NOTE — Telephone Encounter (Signed)
Please call and schedule a follow up ov for pt. Thank you.

## 2013-06-08 NOTE — Telephone Encounter (Signed)
Please schedule f/u ov

## 2013-06-25 NOTE — Telephone Encounter (Signed)
LM FOR PT TO CALL TO SCHEULE AN APPT.JK

## 2013-06-28 ENCOUNTER — Telehealth: Payer: Self-pay | Admitting: Hematology & Oncology

## 2013-06-28 NOTE — Telephone Encounter (Signed)
Angelica Chessman called wanting [place] to see pt. He is a patient of Dr. Clelia Croft. Tiffany in medical records aware to contact to schedule appointment. Chart faxed to Gaynelle Adu aware.

## 2013-07-02 ENCOUNTER — Telehealth: Payer: Self-pay | Admitting: *Deleted

## 2013-07-02 NOTE — Telephone Encounter (Signed)
[name] from [employer] calling to request a hematology appt, d/t lab work. Labs to dr Alver Fisher desk.

## 2013-07-03 ENCOUNTER — Encounter: Payer: Self-pay | Admitting: *Deleted

## 2013-07-03 ENCOUNTER — Other Ambulatory Visit: Payer: Self-pay | Admitting: Oncology

## 2013-07-03 DIAGNOSIS — D649 Anemia, unspecified: Secondary | ICD-10-CM

## 2013-07-03 NOTE — Progress Notes (Signed)
pof to schedulers for appt 07/04/13 3:00 labs and 3:30 dr Clelia Croft. [facility] aware.

## 2013-07-04 ENCOUNTER — Telehealth: Payer: Self-pay | Admitting: Oncology

## 2013-07-04 ENCOUNTER — Ambulatory Visit (HOSPITAL_BASED_OUTPATIENT_CLINIC_OR_DEPARTMENT_OTHER): Payer: Medicare Other | Admitting: Oncology

## 2013-07-04 ENCOUNTER — Other Ambulatory Visit (HOSPITAL_BASED_OUTPATIENT_CLINIC_OR_DEPARTMENT_OTHER): Payer: Medicare Other | Admitting: Lab

## 2013-07-04 VITALS — BP 137/53 | HR 78 | Temp 98.7°F | Resp 20 | Ht 66.5 in | Wt 197.4 lb

## 2013-07-04 DIAGNOSIS — D649 Anemia, unspecified: Secondary | ICD-10-CM

## 2013-07-04 DIAGNOSIS — D61818 Other pancytopenia: Secondary | ICD-10-CM

## 2013-07-04 LAB — COMPREHENSIVE METABOLIC PANEL (CC13)
Alkaline Phosphatase: 73 U/L (ref 40–150)
CO2: 24 mEq/L (ref 22–29)
Creatinine: 1.6 mg/dL — ABNORMAL HIGH (ref 0.7–1.3)
Glucose: 155 mg/dl — ABNORMAL HIGH (ref 70–140)
Total Bilirubin: 0.37 mg/dL (ref 0.20–1.20)

## 2013-07-04 LAB — CBC WITH DIFFERENTIAL/PLATELET
Basophils Absolute: 0 10*3/uL (ref 0.0–0.1)
EOS%: 2.2 % (ref 0.0–7.0)
Eosinophils Absolute: 0.1 10*3/uL (ref 0.0–0.5)
LYMPH%: 18 % (ref 14.0–49.0)
MCH: 31.8 pg (ref 27.2–33.4)
MCV: 94 fL (ref 79.3–98.0)
MONO%: 7.8 % (ref 0.0–14.0)
NEUT#: 2.5 10*3/uL (ref 1.5–6.5)
Platelets: 85 10*3/uL — ABNORMAL LOW (ref 140–400)
RBC: 2.74 10*6/uL — ABNORMAL LOW (ref 4.20–5.82)
RDW: 14.8 % — ABNORMAL HIGH (ref 11.0–14.6)

## 2013-07-04 LAB — CHCC SMEAR

## 2013-07-04 NOTE — Telephone Encounter (Signed)
Gave pt appt for 8/14 for MD and also gave pt oral contrast , gave CT order to [facility] for precert

## 2013-07-04 NOTE — Progress Notes (Signed)
Hematology and Oncology Follow Up Visit  [name] [id number] 1946/06/01 67 y.o. 07/04/2013 4:06 PM Florentina Jenny, MDTripp, [name], MD   Principle Diagnosis: 67 year old with pancytopenia diagnosed in 01/2013. 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   Interim History:  [name] presents for a follow up visit. He is a very man I saw in 01/2012 for the above diagnosis. Since that last visit, he failed to show up for for a follow up. He was referred back for revaluation. Clinically, he has been doing poorly with weakness, fatigue and tiredness. He reports no bleeding at this time but reports abdominal distention and early satiety.  He report slurring speech at times. No GI or GU symptoms.   Medications: I have reviewed the patient's current medications.  Current Outpatient Prescriptions  Medication Sig Dispense Refill  . aspirin 81 MG chewable tablet Chew 81 mg by mouth daily.      Marland Kitchen buPROPion (WELLBUTRIN) 75 MG tablet Take 75 mg by mouth daily.      . Cholecalciferol (VITAMIN D3) 1000 UNITS CAPS Take 1 capsule by mouth daily.      . divalproex (DEPAKOTE) 125 MG DR tablet Take 125 mg by mouth every morning.      . fenofibrate 160 MG tablet Take 160 mg by mouth daily.      . ferrous sulfate 325 (65 FE) MG tablet Take 325 mg by mouth daily with breakfast.      . ibuprofen (ADVIL,MOTRIN) 200 MG tablet Take 400 mg by mouth 3 (three) times daily.      . insulin aspart (NOVOLOG) 100 UNIT/ML injection Inject 40-50 Units into the skin 3 (three) times daily with meals. 50 units with breakfast 50 units with lunch 40 units with supper      . insulin glargine (LANTUS) 100 UNIT/ML injection Inject 70 Units into the skin at bedtime.      Marland Kitchen lithium carbonate 300 MG capsule Take 300 mg by mouth 2 (two) times daily with a meal.      . meclizine (ANTIVERT) 25 MG tablet Take 25 mg by mouth every 12 (twelve) hours as needed for dizziness.      . Melatonin 3 MG CAPS Take 6 mg by mouth at  bedtime.       . Multiple Vitamin (DAILY VITE) TABS Take 1 tablet by mouth daily.      Marland Kitchen nystatin-triamcinolone (MYCOLOG II) cream Apply 1 application topically 2 (two) times daily.      Marland Kitchen omeprazole (PRILOSEC) 20 MG capsule Take 20 mg by mouth daily.      Marland Kitchen oxyCODONE-acetaminophen (PERCOCET/ROXICET) 5-325 MG per tablet Take 1 tablet by mouth every 6 (six) hours as needed for pain.      Marland Kitchen sertraline (ZOLOFT) 25 MG tablet Take 25 mg by mouth daily.      . simvastatin (ZOCOR) 40 MG tablet Take 40 mg by mouth every evening.      . Zinc Oxide (DESITIN CREAMY EX) Apply 1 Package topically 4 (four) times daily as needed (itching/irritation).      . nystatin cream (MYCOSTATIN) Apply 1 application topically 3 (three) times daily.      . [DISCONTINUED] zolpidem (AMBIEN) 5 MG tablet Take 5 mg by mouth at bedtime as needed.       No current facility-administered medications for this visit.     Allergies:  Allergies  Allergen Reactions  . Penicillins Hives    Past Medical History, Surgical history, Social history,  and Family History were reviewed and updated.  Review of Systems: Constitutional:  Negative for fever, chills, night sweats, anorexia, weight loss, pain. Cardiovascular: no chest pain or dyspnea on exertion Respiratory: no cough, shortness of breath, or wheezing Neurological: negative Dermatological: negative ENT: negative Skin: Negative. Genito-Urinary: negative Hematological and Lymphatic: negative Breast: negative Musculoskeletal: negative Remaining ROS negative. Physical Exam: Blood pressure 137/53, pulse 78, temperature 98.7 F (37.1 C), temperature source Oral, resp. rate 20, height 5' 6.5" (1.689 m), weight 197 lb 6.4 oz (89.54 kg). ECOG: 2 General appearance: alert and cooperative Head: Normocephalic, without obvious abnormality, atraumatic Neck: no adenopathy, no carotid bruit, no JVD, supple, symmetrical, trachea midline and thyroid not enlarged, symmetric, no  tenderness/mass/nodules Lymph nodes: Cervical, supraclavicular, and axillary nodes normal. Heart:regular rate and rhythm, S1, S2 normal, no murmur, click, rub or gallop Lung:chest clear, no wheezing, rales, normal symmetric air entry Abdomin: soft, non-tender, without masses or organomegaly and distended EXT:no erythema, induration, or nodules   Lab Results: Lab Results  Component Value Date   WBC 3.6* 07/04/2013   HGB 8.7* 07/04/2013   HCT 25.7* 07/04/2013   MCV 94.0 07/04/2013   PLT 85* 07/04/2013     Chemistry      Component Value Date/Time   NA 135* 07/04/2013 1522   NA 138 02/21/2013 1256   K 4.9 07/04/2013 1522   K 5.5* 02/21/2013 1256   CL 103 02/21/2013 1256   CO2 24 07/04/2013 1522   CO2 24 02/21/2013 1256   BUN 34.6* 07/04/2013 1522   BUN 27* 02/21/2013 1256   CREATININE 1.6* 07/04/2013 1522   CREATININE 1.06 02/21/2013 1256      Component Value Date/Time   CALCIUM 8.8 07/04/2013 1522   CALCIUM 9.3 02/21/2013 1256   ALKPHOS 73 07/04/2013 1522   ALKPHOS 56 02/21/2013 1256   AST 69* 07/04/2013 1522   AST 59* 02/21/2013 1256   ALT 40 07/04/2013 1522   ALT 36 02/21/2013 1256   BILITOT 0.37 07/04/2013 1522   BILITOT 0.3 02/21/2013 1256       Impression and Plan:  67 year old with the following issues:   1. Pancytopenia: His counts today are not dramatically different from previous checks. His WBC is better today than what it was for the last two years. His platelets are 85 K and are better than last year (74K). He is more anemic however without signs of bleeding. He could be developing anemia of chronic disease vs anemia of renal disease (cr. 1.6). Also, he could be developing a bone marrow disease such MDS or lymphoproliferative disorder. I discussed with [name] the possible need for a bone marrow biopsy if no good answer for his anemia is identified. He and his sister understand and agree.   2. Abdominal distension: Possible ascites vs gas. I will obtain CT scan to assess his spleen,  liver and possible ascites.If indeed we are dealing with worsening liver disease, he will need GI referral.     Eli Hose, MD 7/23/20144:06 PM

## 2013-07-06 ENCOUNTER — Ambulatory Visit (HOSPITAL_COMMUNITY)
Admission: RE | Admit: 2013-07-06 | Discharge: 2013-07-06 | Disposition: A | Discharge status: Home / Self Care | Payer: Medicare Other | Source: Ambulatory Visit | Attending: Oncology | Admitting: Oncology

## 2013-07-06 ENCOUNTER — Encounter (HOSPITAL_COMMUNITY): Payer: Self-pay

## 2013-07-06 ENCOUNTER — Encounter: Payer: Self-pay | Admitting: Oncology

## 2013-07-06 DIAGNOSIS — K429 Umbilical hernia without obstruction or gangrene: Secondary | ICD-10-CM | POA: Insufficient documentation

## 2013-07-06 DIAGNOSIS — D61818 Other pancytopenia: Secondary | ICD-10-CM | POA: Insufficient documentation

## 2013-07-06 DIAGNOSIS — D7389 Other diseases of spleen: Secondary | ICD-10-CM | POA: Insufficient documentation

## 2013-07-06 DIAGNOSIS — I868 Varicose veins of other specified sites: Secondary | ICD-10-CM | POA: Insufficient documentation

## 2013-07-06 DIAGNOSIS — R141 Gas pain: Secondary | ICD-10-CM | POA: Insufficient documentation

## 2013-07-06 DIAGNOSIS — R142 Eructation: Secondary | ICD-10-CM | POA: Insufficient documentation

## 2013-07-06 DIAGNOSIS — I700 Atherosclerosis of aorta: Secondary | ICD-10-CM | POA: Insufficient documentation

## 2013-07-06 DIAGNOSIS — K4090 Unilateral inguinal hernia, without obstruction or gangrene, not specified as recurrent: Secondary | ICD-10-CM | POA: Insufficient documentation

## 2013-07-06 MED ORDER — IOHEXOL 300 MG/ML  SOLN
100.0000 mL | Freq: Once | INTRAMUSCULAR | Status: AC | PRN
  Administered 2013-07-06: 100 mL via INTRAVENOUS

## 2013-07-06 NOTE — Progress Notes (Unsigned)
The results of the CT scan discussed with the patient's sister [name] (phone [phone]).  It appears that his splenomegaly has worsened and suggestive of myeloproliferative disorder. These would include myelofibrosis which is a very distinct possibility. Do not think he has a lymphoproliferative disorder but that certainly a possibility. For that reason, we need to do a bone marrow biopsy which I have discussed that during the visit and I reiterated that to the patient's sister who is the power of attorney as well. I will attempt to contact the patient himself at phone number [phone] by his sister will be consenting for the procedure. Risks and benefits of this procedure were discussed again over the phone and we will arrange for that to be done as soon as possible and a followup [place] after that. She have expressed concern about his overall decline and I instructed her to take him to the emergency department if he started developing fevers, chills, nausea, vomiting and out of control hypoglycemia.

## 2013-07-09 ENCOUNTER — Telehealth: Payer: Self-pay | Admitting: *Deleted

## 2013-07-09 ENCOUNTER — Telehealth: Payer: Self-pay

## 2013-07-09 NOTE — Telephone Encounter (Signed)
PT.'S DAUGHTER GAVE THE MESSAGE TO  [facility]OR THAT PT. WILL NEED TRANSPORTION TO HIS [facility]. SINCE PT.'S DAUGHTER IS OUT OF TOWN, SHE WILL ALSO TRY TO MAKE TRANSPORTATION ARRANGEMENTS TO THE [facility] FOR HER FATHER. PT.'S DAUGHTER WILL LET THIS OFFICE KNOW WHO WILL BE BRINGING HER FATHER TO HIS [facility].

## 2013-07-09 NOTE — Telephone Encounter (Signed)
Erin from [facility] let message stating pt's cbg has been70 at times and some days it has been 80's-90's does pt need to make any adjustments to meds, please advise [phone]

## 2013-07-09 NOTE — Telephone Encounter (Signed)
Left detailed message with [facility] re: patient's BMBX appt on 07-13-13. Spoke with patient's sister celeste,  she will try to reach [employer] and will have a family member who is a nurse to accompany patient to appt for BMBX. instructed her to have patient at [facility] admitting desk on 07-13-13 @ 6:45 am, npo after midnight, and accompanied by a driver. [name] verbalized understanding and will call me to confirm she has reached someone, at [employer] with above information.

## 2013-07-10 ENCOUNTER — Telehealth: Payer: Self-pay | Admitting: *Deleted

## 2013-07-10 ENCOUNTER — Other Ambulatory Visit: Payer: Self-pay | Admitting: Internal Medicine

## 2013-07-10 MED ORDER — INSULIN ASPART 100 UNIT/ML ~~LOC~~ SOLN: SUBCUTANEOUS | Status: DC

## 2013-07-10 NOTE — Telephone Encounter (Signed)
Please have them decrease the breakfast and lunch Novolog to 30 units and, if sugars <100 at bedtime, also decrease the dinnertime Novolog to 30.

## 2013-07-10 NOTE — Telephone Encounter (Signed)
[place] from the [facility] home called, she states [name] blood sugar was 78 before lunch and it was 46 after lunch. She gave him orange juice and popcorn, his lantus was recently decreased and he is on 40 units of Novolog, she states that it is happening more frequently at lunch and wants to know what to do.  CB # [id number] [place]

## 2013-07-10 NOTE — Telephone Encounter (Signed)
Fax # [phone]

## 2013-07-10 NOTE — Telephone Encounter (Signed)
Spoke with patient's sister Park Meo, she wants to move BMBX to next week, first week in august, so that she may accompany patient and stay with him afterwards. Gave her the number for central scheduling to re-schedule BMBX. Note to dr Clelia Croft

## 2013-07-10 NOTE — Telephone Encounter (Signed)
Instructions were faxed to [facility]

## 2013-07-10 NOTE — Telephone Encounter (Signed)
Decrease Lantus at night to 70 units. Call back if sugars still <70. If they call back, please tell them to also let us know when sugars are low: am, before lunch, or at night.

## 2013-07-10 NOTE — Telephone Encounter (Signed)
Will fax order to [facility] fax # [phone]

## 2013-07-10 NOTE — Telephone Encounter (Signed)
Spoke with [place], she needs you to fax over orders with the information below

## 2013-07-11 ENCOUNTER — Other Ambulatory Visit: Payer: Self-pay | Admitting: Radiology

## 2013-07-11 ENCOUNTER — Encounter (HOSPITAL_COMMUNITY): Payer: Self-pay | Admitting: Pharmacy Technician

## 2013-07-13 ENCOUNTER — Inpatient Hospital Stay (HOSPITAL_COMMUNITY): Admission: RE | Admit: 2013-07-13 | Payer: Medicare Other | Source: Ambulatory Visit

## 2013-07-13 ENCOUNTER — Ambulatory Visit (HOSPITAL_COMMUNITY): Admission: RE | Admit: 2013-07-13 | Payer: Medicare Other | Source: Ambulatory Visit

## 2013-07-16 ENCOUNTER — Telehealth: Payer: Self-pay | Admitting: Internal Medicine

## 2013-07-16 ENCOUNTER — Emergency Department (HOSPITAL_COMMUNITY)
Admission: EM | Admit: 2013-07-16 | Discharge: 2013-07-16 | Disposition: A | Discharge status: Home / Self Care | Payer: Medicare Other | Attending: Emergency Medicine | Admitting: Emergency Medicine

## 2013-07-16 ENCOUNTER — Encounter (HOSPITAL_COMMUNITY): Payer: Self-pay | Admitting: *Deleted

## 2013-07-16 ENCOUNTER — Emergency Department (HOSPITAL_COMMUNITY): Payer: Medicare Other

## 2013-07-16 DIAGNOSIS — Z87891 Personal history of nicotine dependence: Secondary | ICD-10-CM | POA: Insufficient documentation

## 2013-07-16 DIAGNOSIS — F411 Generalized anxiety disorder: Secondary | ICD-10-CM | POA: Insufficient documentation

## 2013-07-16 DIAGNOSIS — Z79899 Other long term (current) drug therapy: Secondary | ICD-10-CM | POA: Insufficient documentation

## 2013-07-16 DIAGNOSIS — Z880 Allergy status to penicillin: Secondary | ICD-10-CM | POA: Insufficient documentation

## 2013-07-16 DIAGNOSIS — D61818 Other pancytopenia: Secondary | ICD-10-CM | POA: Insufficient documentation

## 2013-07-16 DIAGNOSIS — K219 Gastro-esophageal reflux disease without esophagitis: Secondary | ICD-10-CM | POA: Insufficient documentation

## 2013-07-16 DIAGNOSIS — Z860100 Personal history of colon polyps, unspecified: Secondary | ICD-10-CM | POA: Insufficient documentation

## 2013-07-16 DIAGNOSIS — E1169 Type 2 diabetes mellitus with other specified complication: Secondary | ICD-10-CM | POA: Insufficient documentation

## 2013-07-16 DIAGNOSIS — Z8719 Personal history of other diseases of the digestive system: Secondary | ICD-10-CM | POA: Insufficient documentation

## 2013-07-16 DIAGNOSIS — F329 Major depressive disorder, single episode, unspecified: Secondary | ICD-10-CM | POA: Insufficient documentation

## 2013-07-16 DIAGNOSIS — Z862 Personal history of diseases of the blood and blood-forming organs and certain disorders involving the immune mechanism: Secondary | ICD-10-CM | POA: Insufficient documentation

## 2013-07-16 DIAGNOSIS — E785 Hyperlipidemia, unspecified: Secondary | ICD-10-CM | POA: Insufficient documentation

## 2013-07-16 DIAGNOSIS — M129 Arthropathy, unspecified: Secondary | ICD-10-CM | POA: Insufficient documentation

## 2013-07-16 DIAGNOSIS — Z794 Long term (current) use of insulin: Secondary | ICD-10-CM | POA: Insufficient documentation

## 2013-07-16 DIAGNOSIS — Z7982 Long term (current) use of aspirin: Secondary | ICD-10-CM | POA: Insufficient documentation

## 2013-07-16 DIAGNOSIS — I10 Essential (primary) hypertension: Secondary | ICD-10-CM | POA: Insufficient documentation

## 2013-07-16 DIAGNOSIS — Z8639 Personal history of other endocrine, nutritional and metabolic disease: Secondary | ICD-10-CM | POA: Insufficient documentation

## 2013-07-16 DIAGNOSIS — F3289 Other specified depressive episodes: Secondary | ICD-10-CM | POA: Insufficient documentation

## 2013-07-16 DIAGNOSIS — D649 Anemia, unspecified: Secondary | ICD-10-CM | POA: Insufficient documentation

## 2013-07-16 DIAGNOSIS — R5381 Other malaise: Secondary | ICD-10-CM | POA: Insufficient documentation

## 2013-07-16 LAB — CBC WITH DIFFERENTIAL/PLATELET
Basophils Absolute: 0 10*3/uL (ref 0.0–0.1)
Eosinophils Relative: 3 % (ref 0–5)
HCT: 25 % — ABNORMAL LOW (ref 39.0–52.0)
Hemoglobin: 8.1 g/dL — ABNORMAL LOW (ref 13.0–17.0)
Lymphocytes Relative: 21 % (ref 12–46)
MCHC: 32.4 g/dL (ref 30.0–36.0)
MCV: 95.4 fL (ref 78.0–100.0)
Monocytes Absolute: 0.2 10*3/uL (ref 0.1–1.0)
Monocytes Relative: 7 % (ref 3–12)
RDW: 15.3 % (ref 11.5–15.5)
WBC: 3 10*3/uL — ABNORMAL LOW (ref 4.0–10.5)

## 2013-07-16 LAB — URINALYSIS, ROUTINE W REFLEX MICROSCOPIC
Glucose, UA: NEGATIVE mg/dL
Hgb urine dipstick: NEGATIVE
Ketones, ur: NEGATIVE mg/dL
Protein, ur: NEGATIVE mg/dL
pH: 5.5 (ref 5.0–8.0)

## 2013-07-16 LAB — BASIC METABOLIC PANEL
BUN: 33 mg/dL — ABNORMAL HIGH (ref 6–23)
CO2: 25 mEq/L (ref 19–32)
Calcium: 9.5 mg/dL (ref 8.4–10.5)
Chloride: 103 mEq/L (ref 96–112)
Creatinine, Ser: 1.52 mg/dL — ABNORMAL HIGH (ref 0.50–1.35)
Glucose, Bld: 128 mg/dL — ABNORMAL HIGH (ref 70–99)

## 2013-07-16 LAB — URINE MICROSCOPIC-ADD ON

## 2013-07-16 LAB — GLUCOSE, CAPILLARY
Glucose-Capillary: 120 mg/dL — ABNORMAL HIGH (ref 70–99)
Glucose-Capillary: 128 mg/dL — ABNORMAL HIGH (ref 70–99)

## 2013-07-16 NOTE — ED Notes (Signed)
PT lives at [facility] and for the past week he has been feeling weak and Blood sugar problems. Pt has an appt. For Bone marrow this weak for ? Leukemia .

## 2013-07-16 NOTE — Telephone Encounter (Signed)
Received fax from [place] (sent on 07/13/2013 - received this am): - Pt's CBG at 8 pm was 49, rechecked at 10 pm was 100. Predinner: 76 Advised to decrease Novolog from 30 units tid to 25 units tid.

## 2013-07-16 NOTE — ED Provider Notes (Signed)
CSN: [id number]     Arrival date & time 07/16/13  1622 History     First MD Initiated Contact with Patient 07/16/13 1628     Chief Complaint  Patient presents with  . Blood Sugar Problem   (Consider location/radiation/quality/duration/timing/severity/associated sxs/prior Treatment) HPI   BROGAN MARTIS is a 67 y.o.male with a significant PMH of hyperglycemia, hyponatremia, diabetes mellitus, depression, IBS, GERD, anxiety, hypertension, hyperlipidemia, hemorrhage presents to the ER with complaints of fatigue and blood sugar fluctuations. Reviewing previous medical records, his PCP has been made aware of the fluctuation of his blood sugars dropping too low and recently adjusted for him to have less insulin for his doses. The patient said that this has helped some but his blood sugars still do drop sometimes.   He also was diagnosed with pancytopenia in 01/2013 which the hem/onc doctor thinks is related to cirrhosis of the liver and splenomegaly due to 12 years of heavy alcohol abuse. At his visit on 07/04/2013 he was noted to be doing poorly with per Dr. Redmond School, clinically, he has been doing poorly with weakness, fatigue and tiredness. He reports no bleeding at this time but reports abdominal distention and early satiety. He report slurring speech at times. No GI or GU symptoms. "   Patient denies that his symptoms have worsened since his visit with Dr. Redmond School but he lives at [facility] and they wanted him to be checked out since he continues to feel weak. Pt advises me that Dr. Redmond School thinks he may have leukemia and they are doing a bone marrow biopsy in 1 week.       Past Medical History  Diagnosis Date  . Hyperglycemia   . Hyponatremia   . DMII (diabetes mellitus, type 2)   . Depression   . IBS (irritable bowel syndrome)   . Chest pain   . Unspecified gastritis and gastroduodenitis without mention of hemorrhage   . Arthropathy, unspecified, site unspecified   . History of colon  polyps   . Anemia, unspecified   . Anxiety   . GERD (gastroesophageal reflux disease)   . Hyperlipidemia   . Hypertension    Past Surgical History  Procedure Laterality Date  . Hernia repair  1964   Family History  Problem Relation Age of Onset  . Stroke Mother   . Heart attack Father   . Heart disease Father   . Cancer Other     FH of Breast Cancer-other Relative  . Heart disease Other     Parent  . Hypertension Other     parent  . Cancer Maternal Uncle     colon   History  Substance Use Topics  . Smoking status: Former Games developer  . Smokeless tobacco: Not on file  . Alcohol Use: No    Review of Systems  Constitutional: Positive for fatigue.    Allergies  Penicillins  Home Medications   Current Outpatient Rx  Name  Route  Sig  Dispense  Refill  . aspirin 81 MG chewable tablet   Oral   Chew 81 mg by mouth daily.         Marland Kitchen buPROPion (WELLBUTRIN XL) 150 MG 24 hr tablet   Oral   Take 150 mg by mouth daily.         . BuPROPion HCl (WELLBUTRIN PO)   Oral   Take 150 mg by mouth daily.         Josefa Half Peroxide (EARWAX TREATMENT DROPS OT)  Both Ears   Place 15 mLs into both ears once a week. On Friday         . Cholecalciferol (VITAMIN D3) 1000 UNITS CAPS   Oral   Take 1,000 Units by mouth daily.          . fenofibrate 160 MG tablet   Oral   Take 160 mg by mouth daily.          . ferrous sulfate 325 (65 FE) MG tablet   Oral   Take 325 mg by mouth daily with breakfast.         . guaiFENesin (ROBITUSSIN) 100 MG/5ML liquid   Oral   Take 30 mLs by mouth every 6 (six) hours as needed for cough.         Marland Kitchen HYDROcodone-acetaminophen (NORCO/VICODIN) 5-325 MG per tablet   Oral   Take 1 tablet by mouth every 6 (six) hours as needed for pain.         Marland Kitchen ibuprofen (ADVIL,MOTRIN) 200 MG tablet   Oral   Take 400 mg by mouth 3 (three) times daily. 8am, 2pm, 8pm         . insulin aspart (NOVOLOG) 100 UNIT/ML injection   Subcutaneous    Inject 25 Units into the skin 3 (three) times daily before meals.          . insulin glargine (LANTUS) 100 UNIT/ML injection   Subcutaneous   Inject 70 Units into the skin at bedtime.          Marland Kitchen lithium carbonate 300 MG capsule   Oral   Take 300 mg by mouth 2 (two) times daily. 8am and 8pm         . Melatonin 10 MG TABS   Oral   Take 10 mg by mouth at bedtime.         . Multiple Vitamin (DAILY VITE) TABS   Oral   Take 1 tablet by mouth daily.         Marland Kitchen nystatin (MYCOSTATIN/NYSTOP) 100000 UNIT/GM POWD   Topical   Apply 1 g topically as needed. After every void or bowel movement; self admin and/or apply to groin after bathing to prevent recurrence.         . nystatin-triamcinolone (MYCOLOG II) cream   Topical   Apply 1 application topically 2 (two) times daily. Apply to back of neck and scalp         . omeprazole (PRILOSEC) 20 MG capsule   Oral   Take 20 mg by mouth daily.         . sertraline (ZOLOFT) 25 MG tablet   Oral   Take 50 mg by mouth daily.          . simvastatin (ZOCOR) 40 MG tablet   Oral   Take 40 mg by mouth at bedtime.         . traZODone (DESYREL) 50 MG tablet   Oral   Take 50 mg by mouth See admin instructions. Take 1 tablet (50 mg) by mouth daily at bedtime, may take an additional tablet one hour after scheduled dose as needed for insomnia         . Wheat Dextrin (BENEFIBER) POWD   Oral   Take 10 mLs by mouth daily. Mix in 4 oz of water and drink         . Zinc Oxide (DESITIN CREAMY EX)   Apply externally   Apply 1 application topically 4 (four) times daily. Apply  to perianal area         . levofloxacin (LEVAQUIN) 500 MG tablet   Oral   Take 500 mg by mouth daily. For 10 days          BP 118/55  Pulse 66  Resp 22  SpO2 100% Physical Exam  Nursing note and vitals reviewed. Constitutional: He appears well-developed and well-nourished. No distress.  HENT:  Head: Normocephalic and atraumatic.  Eyes: Pupils are  equal, round, and reactive to light.  Neck: Normal range of motion. Neck supple.  Cardiovascular: Normal rate and regular rhythm.   Pulmonary/Chest: Effort normal.  Abdominal: Soft.  Neurological: He is alert.  Skin: Skin is warm and dry.    ED Course   Procedures (including critical care time)  Labs Reviewed  GLUCOSE, CAPILLARY - Abnormal; Notable for the following:    Glucose-Capillary 120 (*)    All other components within normal limits  URINALYSIS, ROUTINE W REFLEX MICROSCOPIC - Abnormal; Notable for the following:    Leukocytes, UA SMALL (*)    All other components within normal limits  CBC WITH DIFFERENTIAL - Abnormal; Notable for the following:    WBC 3.0 (*)    RBC 2.62 (*)    Hemoglobin 8.1 (*)    HCT 25.0 (*)    Platelets 105 (*)    Lymphs Abs 0.6 (*)    All other components within normal limits  BASIC METABOLIC PANEL - Abnormal; Notable for the following:    Glucose, Bld 128 (*)    BUN 33 (*)    Creatinine, Ser 1.52 (*)    GFR calc non Af Amer 46 (*)    GFR calc Af Amer 53 (*)    All other components within normal limits  GLUCOSE, CAPILLARY - Abnormal; Notable for the following:    Glucose-Capillary 128 (*)    All other components within normal limits  URINE MICROSCOPIC-ADD ON - Abnormal; Notable for the following:    Squamous Epithelial / LPF FEW (*)    All other components within normal limits  URINE CULTURE   Dg Chest 2 View  07/16/2013   *RADIOLOGY REPORT*  Clinical Data: 59 and weakness  CHEST - 2 VIEW  Comparison: 09/25/2009  Findings: The cardiac silhouette is normal in size and configuration.  The mediastinum is normal in contour caliber.  No hilar masses.  The lungs are clear.  No pleural effusion or pneumothorax.  The bony thorax is intact.  IMPRESSION: No active disease of the chest.   Original Report Authenticated By: Amie Portland, M.D.   1. Pancytopenia     MDM  Patient has benign physical exam and labs are at his baseline.  Urinalysis showed  small amount of leukocytes so urine culture has been added on. Discussed case with Dr. Patria Mane, pt is to f/u for his bone marrow biopsy next week. I believe that his symptoms are most likely due to his chronic disease.  67 y.o.Marolyn Hammock Celona's evaluation in the Emergency Department is complete. It has been determined that no acute conditions requiring further emergency intervention are present at this time. The patient/guardian have been advised of the diagnosis and plan. We have discussed signs and symptoms that warrant return to the ED, such as changes or worsening in symptoms.  Sister is here with patient and plans to take him back to [facility]  Vital signs are stable at discharge. Filed Vitals:   07/16/13 1715  BP: 118/55  Pulse: 66  Resp: 22  Patient/guardian has voiced understanding and agreed to follow-up with the PCP or specialist.   Dorthula Matas, PA-C 07/16/13 2027

## 2013-07-16 NOTE — ED Provider Notes (Signed)
Medical screening examination/treatment/procedure(s) were performed by non-physician practitioner and as supervising physician I was immediately available for consultation/collaboration.  Lyanne Co, MD 07/16/13 2028

## 2013-07-18 ENCOUNTER — Ambulatory Visit (HOSPITAL_COMMUNITY)
Admission: RE | Admit: 2013-07-18 | Discharge: 2013-07-18 | Disposition: A | Discharge status: Home / Self Care | Payer: Medicare Other | Source: Ambulatory Visit | Attending: Oncology | Admitting: Oncology

## 2013-07-18 ENCOUNTER — Telehealth: Payer: Self-pay | Admitting: *Deleted

## 2013-07-18 ENCOUNTER — Encounter (HOSPITAL_COMMUNITY): Payer: Self-pay

## 2013-07-18 DIAGNOSIS — I10 Essential (primary) hypertension: Secondary | ICD-10-CM | POA: Insufficient documentation

## 2013-07-18 DIAGNOSIS — R5383 Other fatigue: Secondary | ICD-10-CM | POA: Insufficient documentation

## 2013-07-18 DIAGNOSIS — K7460 Unspecified cirrhosis of liver: Secondary | ICD-10-CM | POA: Insufficient documentation

## 2013-07-18 DIAGNOSIS — K219 Gastro-esophageal reflux disease without esophagitis: Secondary | ICD-10-CM | POA: Insufficient documentation

## 2013-07-18 DIAGNOSIS — R5381 Other malaise: Secondary | ICD-10-CM | POA: Insufficient documentation

## 2013-07-18 DIAGNOSIS — R161 Splenomegaly, not elsewhere classified: Secondary | ICD-10-CM | POA: Insufficient documentation

## 2013-07-18 DIAGNOSIS — E785 Hyperlipidemia, unspecified: Secondary | ICD-10-CM | POA: Insufficient documentation

## 2013-07-18 DIAGNOSIS — E119 Type 2 diabetes mellitus without complications: Secondary | ICD-10-CM | POA: Insufficient documentation

## 2013-07-18 DIAGNOSIS — D61818 Other pancytopenia: Secondary | ICD-10-CM | POA: Insufficient documentation

## 2013-07-18 LAB — CBC
HCT: 25.6 % — ABNORMAL LOW (ref 39.0–52.0)
Hemoglobin: 8.5 g/dL — ABNORMAL LOW (ref 13.0–17.0)
MCH: 31.6 pg (ref 26.0–34.0)
MCHC: 33.2 g/dL (ref 30.0–36.0)
MCV: 95.2 fL (ref 78.0–100.0)
RBC: 2.69 MIL/uL — ABNORMAL LOW (ref 4.22–5.81)

## 2013-07-18 LAB — GLUCOSE, CAPILLARY: Glucose-Capillary: 138 mg/dL — ABNORMAL HIGH (ref 70–99)

## 2013-07-18 LAB — BONE MARROW EXAM

## 2013-07-18 LAB — PROTIME-INR: Prothrombin Time: 16.5 seconds — ABNORMAL HIGH (ref 11.6–15.2)

## 2013-07-18 MED ORDER — SODIUM CHLORIDE 0.9 % IV SOLN
INTRAVENOUS | Status: DC
  Administered 2013-07-18: 20 mL/h via INTRAVENOUS

## 2013-07-18 MED ORDER — MIDAZOLAM HCL 2 MG/2ML IJ SOLN
INTRAMUSCULAR | Status: AC
  Filled 2013-07-18: qty 6

## 2013-07-18 MED ORDER — FENTANYL CITRATE 0.05 MG/ML IJ SOLN
INTRAMUSCULAR | Status: AC | PRN
  Administered 2013-07-18: 100 ug via INTRAVENOUS

## 2013-07-18 MED ORDER — FENTANYL CITRATE 0.05 MG/ML IJ SOLN
INTRAMUSCULAR | Status: AC
  Filled 2013-07-18: qty 6

## 2013-07-18 MED ORDER — MIDAZOLAM HCL 2 MG/2ML IJ SOLN
INTRAMUSCULAR | Status: AC | PRN
  Administered 2013-07-18: 1 mg via INTRAVENOUS

## 2013-07-18 NOTE — Procedures (Signed)
Procedure:  CT guided right iliac bone marrow biopsy Findings:  Aspirate and core bx performed with [employer] system.

## 2013-07-18 NOTE — H&P (Signed)
Agree 

## 2013-07-18 NOTE — Telephone Encounter (Signed)
Patient's sister [name] calling to say patient is "going down hill" can hardly walk,loosing his balance, can't complete a sentence and is very lethargic. Per dr Clelia Croft, it is probably d/t his lithium or thyroid levels, not a blood issue at this time. Will wait for results of BMBX. Sister verbalizes understanding.

## 2013-07-18 NOTE — H&P (Signed)
[name] is an 67 y.o. male.   Chief Complaint: Cirrhosis; pancytopenia Tired; weak Recent evaluation of splenomegaly Scheduled now for bone marrow biopsy Possible myeloproliferative disorder HPI: Hyperglycemic; hyponatremia; DM; IBS; HTN; HLD; pancytopenia; splenomegaly  Past Medical History  Diagnosis Date  . Hyperglycemia   . Hyponatremia   . DMII (diabetes mellitus, type 2)   . Depression   . IBS (irritable bowel syndrome)   . Chest pain   . Unspecified gastritis and gastroduodenitis without mention of hemorrhage   . Arthropathy, unspecified, site unspecified   . History of colon polyps   . Anemia, unspecified   . Anxiety   . GERD (gastroesophageal reflux disease)   . Hyperlipidemia   . Hypertension     Past Surgical History  Procedure Laterality Date  . Hernia repair  1964    Family History  Problem Relation Age of Onset  . Stroke Mother   . Heart attack Father   . Heart disease Father   . Cancer Other     FH of Breast Cancer-other Relative  . Heart disease Other     Parent  . Hypertension Other     parent  . Cancer Maternal Uncle     colon   Social History:  reports that he has quit smoking. He does not have any smokeless tobacco history on file. He reports that he does not drink alcohol or use illicit drugs.  Allergies:  Allergies  Allergen Reactions  . Penicillins Hives     (Not in a hospital admission)  Results for orders placed during the hospital encounter of 07/18/13 (from the past 48 hour(s))  APTT     Status: None   Collection Time    07/18/13  7:20 AM      Result Value Range   aPTT 29  24 - 37 seconds  CBC     Status: Abnormal   Collection Time    07/18/13  7:20 AM      Result Value Range   WBC 3.5 (*) 4.0 - 10.5 K/uL   RBC 2.69 (*) 4.22 - 5.81 MIL/uL   Hemoglobin 8.5 (*) 13.0 - 17.0 g/dL   HCT 16.1 (*) 09.6 - 04.5 %   MCV 95.2  78.0 - 100.0 fL   MCH 31.6  26.0 - 34.0 pg   MCHC 33.2  30.0 - 36.0 g/dL   RDW 40.9  81.1 -  91.4 %   Platelets 112 (*) 150 - 400 K/uL   Comment: SPECIMEN CHECKED FOR CLOTS     REPEATED TO VERIFY     PLATELET COUNT CONFIRMED BY SMEAR  PROTIME-INR     Status: Abnormal   Collection Time    07/18/13  7:20 AM      Result Value Range   Prothrombin Time 16.5 (*) 11.6 - 15.2 seconds   INR 1.37  0.00 - 1.49   Dg Chest 2 View  07/16/2013   *RADIOLOGY REPORT*  Clinical Data: 59 and weakness  CHEST - 2 VIEW  Comparison: 09/25/2009  Findings: The cardiac silhouette is normal in size and configuration.  The mediastinum is normal in contour caliber.  No hilar masses.  The lungs are clear.  No pleural effusion or pneumothorax.  The bony thorax is intact.  IMPRESSION: No active disease of the chest.   Original Report Authenticated By: Amie Portland, M.D.    Review of Systems  Constitutional: Positive for malaise/fatigue. Negative for fever and weight loss.  Respiratory: Positive for cough. Negative  for shortness of breath.   Cardiovascular: Negative for chest pain.  Gastrointestinal: Positive for abdominal pain. Negative for nausea and vomiting.  Musculoskeletal: Negative for back pain.  Neurological: Positive for weakness. Negative for dizziness and headaches.    Pulse 70, temperature 98.2 F (36.8 C), temperature source Oral, resp. rate 20, height 5' 6.5" (1.689 m), weight 197 lb (89.359 kg), SpO2 98.00%. Physical Exam  Constitutional: He is oriented to person, place, and time. He appears well-developed and well-nourished.  Cardiovascular: Normal rate, regular rhythm and normal heart sounds.   No murmur heard. Respiratory: Effort normal and breath sounds normal.  GI: Soft. Bowel sounds are normal. He exhibits distension. There is no tenderness.  Musculoskeletal: Normal range of motion.  Moves all 4s Uses walker  Neurological: He is alert and oriented to person, place, and time.  Skin: Skin is warm and dry.  Psychiatric: He has a normal mood and affect. His behavior is normal. Judgment  and thought content normal.  Mild slow cognition abilities A/O; appropriate     Assessment/Plan Splenomegaly worsening Hx cirrhosis; pancytopenia tired and weakness Poss myeloproliferative disorder Scheduled for BM bx per Dr Clelia Croft Pt aware of procedure benefits and risks and agreeable to proceed Consent signed and in chart  [name] A 07/18/2013, 7:50 AM

## 2013-07-19 LAB — URINE CULTURE: Colony Count: 40000

## 2013-07-20 ENCOUNTER — Inpatient Hospital Stay (HOSPITAL_COMMUNITY)
Admission: EM | Admit: 2013-07-20 | Discharge: 2013-07-21 | DRG: 810 | Disposition: A | Discharge status: Nursing Home (Includes ICF) | Payer: Medicare Other | Attending: Internal Medicine | Admitting: Internal Medicine

## 2013-07-20 ENCOUNTER — Encounter (HOSPITAL_COMMUNITY): Payer: Self-pay | Admitting: Emergency Medicine

## 2013-07-20 DIAGNOSIS — E1149 Type 2 diabetes mellitus with other diabetic neurological complication: Secondary | ICD-10-CM | POA: Diagnosis present

## 2013-07-20 DIAGNOSIS — F419 Anxiety disorder, unspecified: Secondary | ICD-10-CM

## 2013-07-20 DIAGNOSIS — Z87891 Personal history of nicotine dependence: Secondary | ICD-10-CM

## 2013-07-20 DIAGNOSIS — R5383 Other fatigue: Secondary | ICD-10-CM | POA: Diagnosis present

## 2013-07-20 DIAGNOSIS — R5381 Other malaise: Secondary | ICD-10-CM | POA: Diagnosis present

## 2013-07-20 DIAGNOSIS — D649 Anemia, unspecified: Secondary | ICD-10-CM

## 2013-07-20 DIAGNOSIS — F329 Major depressive disorder, single episode, unspecified: Secondary | ICD-10-CM

## 2013-07-20 DIAGNOSIS — R0602 Shortness of breath: Secondary | ICD-10-CM | POA: Diagnosis present

## 2013-07-20 DIAGNOSIS — E1049 Type 1 diabetes mellitus with other diabetic neurological complication: Secondary | ICD-10-CM | POA: Diagnosis present

## 2013-07-20 DIAGNOSIS — D61818 Other pancytopenia: Principal | ICD-10-CM | POA: Diagnosis present

## 2013-07-20 DIAGNOSIS — K648 Other hemorrhoids: Secondary | ICD-10-CM

## 2013-07-20 DIAGNOSIS — E1065 Type 1 diabetes mellitus with hyperglycemia: Secondary | ICD-10-CM | POA: Diagnosis present

## 2013-07-20 DIAGNOSIS — L290 Pruritus ani: Secondary | ICD-10-CM

## 2013-07-20 DIAGNOSIS — I10 Essential (primary) hypertension: Secondary | ICD-10-CM | POA: Diagnosis present

## 2013-07-20 DIAGNOSIS — D539 Nutritional anemia, unspecified: Secondary | ICD-10-CM | POA: Diagnosis present

## 2013-07-20 DIAGNOSIS — L409 Psoriasis, unspecified: Secondary | ICD-10-CM

## 2013-07-20 DIAGNOSIS — F319 Bipolar disorder, unspecified: Secondary | ICD-10-CM | POA: Diagnosis present

## 2013-07-20 DIAGNOSIS — R531 Weakness: Secondary | ICD-10-CM | POA: Diagnosis present

## 2013-07-20 DIAGNOSIS — E785 Hyperlipidemia, unspecified: Secondary | ICD-10-CM | POA: Diagnosis present

## 2013-07-20 DIAGNOSIS — E119 Type 2 diabetes mellitus without complications: Secondary | ICD-10-CM | POA: Diagnosis not present

## 2013-07-20 DIAGNOSIS — K219 Gastro-esophageal reflux disease without esophagitis: Secondary | ICD-10-CM

## 2013-07-20 LAB — PROTIME-INR
INR: 1.36 (ref 0.00–1.49)
Prothrombin Time: 16.4 seconds — ABNORMAL HIGH (ref 11.6–15.2)

## 2013-07-20 LAB — BASIC METABOLIC PANEL
Calcium: 9 mg/dL (ref 8.4–10.5)
GFR calc Af Amer: 61 mL/min — ABNORMAL LOW (ref >90)
GFR calc non Af Amer: 52 mL/min — ABNORMAL LOW (ref >90)
Glucose, Bld: 131 mg/dL — ABNORMAL HIGH (ref 70–99)
Sodium: 135 mEq/L (ref 135–145)

## 2013-07-20 LAB — CBC WITH DIFFERENTIAL/PLATELET
Basophils Absolute: 0 10*3/uL (ref 0.0–0.1)
Eosinophils Relative: 4 % (ref 0–5)
Lymphocytes Relative: 19 % (ref 12–46)
Lymphs Abs: 0.5 10*3/uL — ABNORMAL LOW (ref 0.7–4.0)
MCV: 94.8 fL (ref 78.0–100.0)
Monocytes Relative: 8 % (ref 3–12)
Neutrophils Relative %: 69 % (ref 43–77)
Platelets: 107 10*3/uL — ABNORMAL LOW (ref 150–400)
RBC: 2.52 MIL/uL — ABNORMAL LOW (ref 4.22–5.81)
RDW: 14.8 % (ref 11.5–15.5)
WBC: 2.7 10*3/uL — ABNORMAL LOW (ref 4.0–10.5)

## 2013-07-20 LAB — GLUCOSE, CAPILLARY: Glucose-Capillary: 201 mg/dL — ABNORMAL HIGH (ref 70–99)

## 2013-07-20 LAB — OCCULT BLOOD, POC DEVICE: Fecal Occult Bld: POSITIVE — AB

## 2013-07-20 MED ORDER — SERTRALINE HCL 50 MG PO TABS
50.0000 mg | ORAL_TABLET | Freq: Every morning | ORAL | Status: DC
  Administered 2013-07-21: 50 mg via ORAL
  Filled 2013-07-20: qty 1

## 2013-07-20 MED ORDER — INSULIN ASPART 100 UNIT/ML ~~LOC~~ SOLN
3.0000 [IU] | Freq: Three times a day (TID) | SUBCUTANEOUS | Status: DC
  Administered 2013-07-21: 3 [IU] via SUBCUTANEOUS

## 2013-07-20 MED ORDER — HYDROCODONE-ACETAMINOPHEN 5-325 MG PO TABS
1.0000 | ORAL_TABLET | ORAL | Status: DC | PRN
  Administered 2013-07-21: 1 via ORAL
  Filled 2013-07-20: qty 1

## 2013-07-20 MED ORDER — ONDANSETRON HCL 4 MG PO TABS: 4.0000 mg | ORAL_TABLET | Freq: Four times a day (QID) | ORAL | Status: DC | PRN

## 2013-07-20 MED ORDER — SIMVASTATIN 40 MG PO TABS
40.0000 mg | ORAL_TABLET | Freq: Every day | ORAL | Status: DC
  Administered 2013-07-20: 40 mg via ORAL
  Filled 2013-07-20 (×2): qty 1

## 2013-07-20 MED ORDER — FERROUS SULFATE 325 (65 FE) MG PO TABS
325.0000 mg | ORAL_TABLET | Freq: Every day | ORAL | Status: DC
  Administered 2013-07-21: 325 mg via ORAL
  Filled 2013-07-20 (×2): qty 1

## 2013-07-20 MED ORDER — SODIUM CHLORIDE 0.9 % IJ SOLN: 3.0000 mL | Freq: Two times a day (BID) | INTRAMUSCULAR | Status: DC

## 2013-07-20 MED ORDER — LITHIUM CARBONATE 300 MG PO CAPS: 300.0000 mg | ORAL_CAPSULE | Freq: Two times a day (BID) | ORAL | Status: DC

## 2013-07-20 MED ORDER — ASPIRIN 81 MG PO CHEW
81.0000 mg | CHEWABLE_TABLET | Freq: Every morning | ORAL | Status: DC
  Administered 2013-07-21: 81 mg via ORAL
  Filled 2013-07-20: qty 1

## 2013-07-20 MED ORDER — INSULIN GLARGINE 100 UNIT/ML ~~LOC~~ SOLN
35.0000 [IU] | Freq: Two times a day (BID) | SUBCUTANEOUS | Status: DC
  Administered 2013-07-20 – 2013-07-21 (×2): 35 [IU] via SUBCUTANEOUS
  Filled 2013-07-20 (×3): qty 0.35

## 2013-07-20 MED ORDER — TRAZODONE HCL 50 MG PO TABS
50.0000 mg | ORAL_TABLET | Freq: Every evening | ORAL | Status: DC | PRN
  Filled 2013-07-20: qty 1

## 2013-07-20 MED ORDER — ONDANSETRON HCL 4 MG/2ML IJ SOLN: 4.0000 mg | Freq: Four times a day (QID) | INTRAMUSCULAR | Status: DC | PRN

## 2013-07-20 MED ORDER — SODIUM CHLORIDE 0.9 % IV SOLN: 250.0000 mL | INTRAVENOUS | Status: DC | PRN

## 2013-07-20 MED ORDER — ACETAMINOPHEN 650 MG RE SUPP: 650.0000 mg | Freq: Four times a day (QID) | RECTAL | Status: DC | PRN

## 2013-07-20 MED ORDER — SODIUM CHLORIDE 0.9 % IJ SOLN: 3.0000 mL | INTRAMUSCULAR | Status: DC | PRN

## 2013-07-20 MED ORDER — POLYETHYLENE GLYCOL 3350 17 G PO PACK
17.0000 g | PACK | Freq: Every day | ORAL | Status: DC | PRN
  Filled 2013-07-20: qty 1

## 2013-07-20 MED ORDER — SODIUM CHLORIDE 0.9 % IV BOLUS (SEPSIS): 250.0000 mL | Freq: Once | INTRAVENOUS | Status: DC

## 2013-07-20 MED ORDER — INSULIN ASPART 100 UNIT/ML ~~LOC~~ SOLN
0.0000 [IU] | Freq: Three times a day (TID) | SUBCUTANEOUS | Status: DC
  Administered 2013-07-21: 2 [IU] via SUBCUTANEOUS
  Administered 2013-07-21: 3 [IU] via SUBCUTANEOUS

## 2013-07-20 MED ORDER — BUPROPION HCL ER (XL) 150 MG PO TB24
150.0000 mg | ORAL_TABLET | Freq: Every morning | ORAL | Status: DC
  Administered 2013-07-21: 150 mg via ORAL
  Filled 2013-07-20: qty 1

## 2013-07-20 MED ORDER — INSULIN ASPART 100 UNIT/ML ~~LOC~~ SOLN: 25.0000 [IU] | Freq: Three times a day (TID) | SUBCUTANEOUS | Status: DC

## 2013-07-20 MED ORDER — INSULIN ASPART 100 UNIT/ML ~~LOC~~ SOLN
0.0000 [IU] | Freq: Every day | SUBCUTANEOUS | Status: DC
  Administered 2013-07-20: 2 [IU] via SUBCUTANEOUS

## 2013-07-20 MED ORDER — ACETAMINOPHEN 325 MG PO TABS: 650.0000 mg | ORAL_TABLET | Freq: Four times a day (QID) | ORAL | Status: DC | PRN

## 2013-07-20 MED ORDER — FENOFIBRATE 160 MG PO TABS
160.0000 mg | ORAL_TABLET | Freq: Every morning | ORAL | Status: DC
  Administered 2013-07-21: 160 mg via ORAL
  Filled 2013-07-20: qty 1

## 2013-07-20 MED ORDER — PANTOPRAZOLE SODIUM 40 MG PO TBEC
40.0000 mg | DELAYED_RELEASE_TABLET | Freq: Every day | ORAL | Status: DC
  Administered 2013-07-21: 40 mg via ORAL
  Filled 2013-07-20: qty 1

## 2013-07-20 NOTE — Progress Notes (Signed)
Utilization Review completed.  Loyda Costin RN CM  

## 2013-07-20 NOTE — Progress Notes (Signed)
ED Antimicrobial Stewardship Positive Culture Follow Up   [name] is an 66 y.o. male who presented to [facility] on 07/16/2013 with a chief complaint of  Chief Complaint  Patient presents with  . Blood Sugar Problem    Recent Results (from the past 720 hour(s))  URINE CULTURE     Status: None   Collection Time    07/16/13  5:31 PM      Result Value Range Status   Specimen Description URINE, RANDOM   Final   Special Requests ADDED [id number]   Final   Culture  Setup Time     Final   Value: 07/16/2013 22:10     Performed at [facility] Count     Final   Value: 40,000 COLONIES/ML     Performed at [facility]   Culture     Final   Value: STAPHYLOCOCCUS SPECIES (COAGULASE NEGATIVE)     Note: RIFAMPIN AND GENTAMICIN SHOULD NOT BE USED AS SINGLE DRUGS FOR TREATMENT OF STAPH INFECTIONS.     Performed at [facility]   Report Status 07/19/2013 FINAL   Final   Organism ID, Bacteria STAPHYLOCOCCUS SPECIES (COAGULASE NEGATIVE)   Final    No antibiotic prescribed at discharge. Patient with asymptomatic bacteruria - no additional treatment indicated at this time. Please fax results to [facility] where patient resides.   Thanks!!  ED Provider: Doran Durand, PA-C   [name] 07/20/2013, 10:31 AM Infectious Diseases Pharmacist Phone# [phone]

## 2013-07-20 NOTE — ED Provider Notes (Signed)
CSN: [id number]     Arrival date & time 07/20/13  1401 History     First MD Initiated Contact with Patient 07/20/13 1507     Chief Complaint  Patient presents with  . Abnormal Lab   (Consider location/radiation/quality/duration/timing/severity/associated sxs/prior Treatment) The history is provided by the patient.  [name] is a 66 y.o. male hx of HL, DM, here with fatigue and weakness. Currently resides at [facility] assisted living. Came in 4 days ago and was found to be baseline anemic and bacteriuria and was sent home. He got a bone marrow biopsy with results pending. As per his oncologist, he is becoming more weak and fatigue. He denies any fever, vomiting, abdominal pain, dark stools. He was also found to be more anemic with Hg 7.5 and sent for possible admission.    Past Medical History  Diagnosis Date  . Hyperglycemia   . Hyponatremia   . DMII (diabetes mellitus, type 2)   . Depression   . IBS (irritable bowel syndrome)   . Chest pain   . Unspecified gastritis and gastroduodenitis without mention of hemorrhage   . Arthropathy, unspecified, site unspecified   . History of colon polyps   . Anemia, unspecified   . Anxiety   . GERD (gastroesophageal reflux disease)   . Hyperlipidemia   . Hypertension    Past Surgical History  Procedure Laterality Date  . Hernia repair  1964   Family History  Problem Relation Age of Onset  . Stroke Mother   . Heart attack Father   . Heart disease Father   . Cancer Other     FH of Breast Cancer-other Relative  . Heart disease Other     Parent  . Hypertension Other     parent  . Cancer Maternal Uncle     colon   History  Substance Use Topics  . Smoking status: Former Smoker -- 1.00 packs/day    Types: Cigarettes  . Smokeless tobacco: Not on file  . Alcohol Use: No    Review of Systems  Constitutional: Positive for fatigue.  Neurological: Positive for weakness.  All other systems reviewed and are  negative.    Allergies  Penicillins  Home Medications   Current Outpatient Rx  Name  Route  Sig  Dispense  Refill  . aspirin 81 MG chewable tablet   Oral   Chew 81 mg by mouth every morning.          Marland Kitchen buPROPion (WELLBUTRIN XL) 150 MG 24 hr tablet   Oral   Take 150 mg by mouth every morning.          Josefa Half Peroxide (EARWAX TREATMENT DROPS OT)   Both Ears   Place 5 drops into both ears once a week. On Friday         . Cholecalciferol (VITAMIN D3) 1000 UNITS CAPS   Oral   Take 1,000 Units by mouth every morning.          . fenofibrate 160 MG tablet   Oral   Take 160 mg by mouth every morning.          . ferrous sulfate 325 (65 FE) MG tablet   Oral   Take 325 mg by mouth daily with breakfast.         . ibuprofen (ADVIL,MOTRIN) 200 MG tablet   Oral   Take 400 mg by mouth 3 (three) times daily. 8am, 2pm, 8pm         .  insulin aspart (NOVOLOG) 100 UNIT/ML injection   Subcutaneous   Inject 25 Units into the skin 3 (three) times daily before meals.          . insulin glargine (LANTUS) 100 UNIT/ML injection   Subcutaneous   Inject 70 Units into the skin at bedtime.          Marland Kitchen lithium carbonate 300 MG capsule   Oral   Take 300 mg by mouth 2 (two) times daily. 8am and 8pm         . Melatonin 10 MG TABS   Oral   Take 10 mg by mouth at bedtime.         . Multiple Vitamin (DAILY VITE) TABS   Oral   Take 1 tablet by mouth every morning.          . nystatin-triamcinolone (MYCOLOG II) cream   Topical   Apply 1 application topically 2 (two) times daily. Apply to back of neck and scalp         . omeprazole (PRILOSEC) 20 MG capsule   Oral   Take 20 mg by mouth every morning.          . sertraline (ZOLOFT) 25 MG tablet   Oral   Take 50 mg by mouth every morning.          . simvastatin (ZOCOR) 40 MG tablet   Oral   Take 40 mg by mouth at bedtime.         . traZODone (DESYREL) 50 MG tablet   Oral   Take 50 mg by mouth See  admin instructions. 50mg  each evening at bedtime and additional 50mg  one hour after scheduled dose as needed for insomnia         . Wheat Dextrin (BENEFIBER) POWD   Oral   Take 10 mLs by mouth every morning. Mix in 4 oz of water and drink         . Zinc Oxide (DESITIN CREAMY EX)   Apply externally   Apply 1 application topically 4 (four) times daily. Apply to perianal area         . levofloxacin (LEVAQUIN) 500 MG tablet   Oral   Take 500 mg by mouth daily. For 10 days          BP 99/56  Pulse 67  Temp(Src) 98.2 F (36.8 C) (Oral)  Resp 16  SpO2 99% Physical Exam  Nursing note and vitals reviewed. Constitutional:  Chronically ill, tired, NAD   HENT:  Head: Normocephalic.  Mouth/Throat: Oropharynx is clear and moist.  Eyes: Pupils are equal, round, and reactive to light.  Conjunctiva slightly pale   Neck: Normal range of motion. Neck supple.  Cardiovascular: Normal rate, regular rhythm and normal heart sounds.   Pulmonary/Chest: Effort normal and breath sounds normal. No respiratory distress. He has no wheezes. He has no rales.  Abdominal: Soft. Bowel sounds are normal. He exhibits no distension. There is no tenderness. There is no rebound and no guarding.  Rectal- some hemorrhoids, not bleeding. Brown stool.   Musculoskeletal: Normal range of motion.  Neurological: He is alert.  Nl strength and sensation throughout   Skin: Skin is warm and dry.  Psychiatric: He has a normal mood and affect. His behavior is normal. Judgment and thought content normal.    ED Course   Procedures (including critical care time)  CRITICAL CARE Performed by: Silverio Lay, Laquitha Heslin   Total critical care time: 30 min   Critical care time  was exclusive of separately billable procedures and treating other patients.  Critical care was necessary to treat or prevent imminent or life-threatening deterioration.  Critical care was time spent personally by me on the following activities: development  of treatment plan with patient and/or surrogate as well as nursing, discussions with consultants, evaluation of patient's response to treatment, examination of patient, obtaining history from patient or surrogate, ordering and performing treatments and interventions, ordering and review of laboratory studies, ordering and review of radiographic studies, pulse oximetry and re-evaluation of patient's condition.   Labs Reviewed  CBC WITH DIFFERENTIAL - Abnormal; Notable for the following:    WBC 2.7 (*)    RBC 2.52 (*)    Hemoglobin 7.7 (*)    HCT 23.9 (*)    Platelets 107 (*)    Lymphs Abs 0.5 (*)    All other components within normal limits  BASIC METABOLIC PANEL - Abnormal; Notable for the following:    Glucose, Bld 131 (*)    BUN 34 (*)    Creatinine, Ser 1.37 (*)    GFR calc non Af Amer 52 (*)    GFR calc Af Amer 61 (*)    All other components within normal limits  OCCULT BLOOD, POC DEVICE - Abnormal; Notable for the following:    Fecal Occult Bld POSITIVE (*)    All other components within normal limits  LITHIUM LEVEL  PREPARE RBC (CROSSMATCH)  TYPE AND SCREEN   No results found. No diagnosis found.  MDM  [name] is a 67 y.o. male here with weakness, fatigue. Likely symptomatic anemia from chronic disease.   5:53 PM occ positive. He had colonoscopy August 2013 that showed some diverticula. Likely bleeding from diverticula. Transfusion ordered for symptomatic anemia. Admitted to med/surg under Dr. Radonna Ricker.    Richardean Canal, MD 07/20/13 1754

## 2013-07-20 NOTE — ED Notes (Addendum)
Copy of labs faxed to [facility]

## 2013-07-20 NOTE — ED Notes (Signed)
Pt from [facility].  Pt had low Hgb of 7.5, called MD for direct admit, but no inpatient bed available. Told to come to ED to get started. Pt awake oriented, no complaints.

## 2013-07-20 NOTE — ED Notes (Signed)
ZOX:[id number]<AV> Expected date:<BR> Expected time:<BR> Means of arrival:<BR> Comments:<BR> EMS-low HgB

## 2013-07-20 NOTE — H&P (Signed)
[facility] History and Physical  [name] ZOX:[id number] DOB: 1946/11/28 DOA: 07/20/2013  Referring physician: Dr. Silverio Lay PCP: Florentina Jenny, MD  Specialists: none  Chief Complaint: weakness and fatigue  HPI: [name] is a 67 y.o. male  Past medical history of cirrhosis due to alcohol abuse, splenomegaly by CT scan in 2014, also pancytopenia status post CT-guided bone marrow biopsy and was referred to the ED as labs were drawn and he had a low hemoglobin. As per patient he has been feeling generalized weakness for the past month progressively getting worse to the point where he can't even walk outside due to the shortness of breath and fatigue. He relates no melanotic stools. No new medication he is being followed by his oncologist Dr. Clelia Croft as an outpatient.  Review of Systems: The patient denies anorexia, fever, weight loss,, vision loss, decreased hearing, hoarseness, chest pain, syncope, , peripheral edema, balance deficits, hemoptysis, abdominal pain, melena, hematochezia, severe indigestion/heartburn, hematuria, incontinence, genital sores, muscle weakness, suspicious skin lesions, transient blindness, difficulty walking, depression, unusual weight change, abnormal bleeding, enlarged lymph nodes, angioedema, and breast masses.    Past Medical History  Diagnosis Date  . Hyperglycemia   . Hyponatremia   . DMII (diabetes mellitus, type 2)   . Depression   . IBS (irritable bowel syndrome)   . Chest pain   . Unspecified gastritis and gastroduodenitis without mention of hemorrhage   . Arthropathy, unspecified, site unspecified   . History of colon polyps   . Anemia, unspecified   . Anxiety   . GERD (gastroesophageal reflux disease)   . Hyperlipidemia   . Hypertension    Past Surgical History  Procedure Laterality Date  . Hernia repair  1964   Social History:  reports that he has quit smoking. His smoking use included Cigarettes. He smoked 1.00 pack per day. He  does not have any smokeless tobacco history on file. He reports that he does not drink alcohol or use illicit drugs.   Allergies  Allergen Reactions  . Penicillins Hives    Family History  Problem Relation Age of Onset  . Stroke Mother   . Heart attack Father   . Heart disease Father   . Cancer Other     FH of Breast Cancer-other Relative  . Heart disease Other     Parent  . Hypertension Other     parent  . Cancer Maternal Uncle     colon    Prior to Admission medications   Medication Sig Start Date End Date Taking? Authorizing Provider  aspirin 81 MG chewable tablet Chew 81 mg by mouth every morning.    Yes Historical Provider, MD  buPROPion (WELLBUTRIN XL) 150 MG 24 hr tablet Take 150 mg by mouth every morning.    Yes Historical Provider, MD  Carbamide Peroxide (EARWAX TREATMENT DROPS OT) Place 5 drops into both ears once a week. On Friday   Yes Historical Provider, MD  Cholecalciferol (VITAMIN D3) 1000 UNITS CAPS Take 1,000 Units by mouth every morning.    Yes Historical Provider, MD  fenofibrate 160 MG tablet Take 160 mg by mouth every morning.    Yes Historical Provider, MD  ferrous sulfate 325 (65 FE) MG tablet Take 325 mg by mouth daily with breakfast.   Yes Historical Provider, MD  ibuprofen (ADVIL,MOTRIN) 200 MG tablet Take 400 mg by mouth 3 (three) times daily. 8am, 2pm, 8pm   Yes Historical Provider, MD  insulin aspart (NOVOLOG) 100 UNIT/ML injection  Inject 25 Units into the skin 3 (three) times daily before meals.    Yes Historical Provider, MD  insulin glargine (LANTUS) 100 UNIT/ML injection Inject 70 Units into the skin at bedtime.    Yes Historical Provider, MD  lithium carbonate 300 MG capsule Take 300 mg by mouth 2 (two) times daily. 8am and 8pm   Yes Historical Provider, MD  Melatonin 10 MG TABS Take 10 mg by mouth at bedtime.   Yes Historical Provider, MD  Multiple Vitamin (DAILY VITE) TABS Take 1 tablet by mouth every morning.    Yes Historical Provider, MD   nystatin-triamcinolone (MYCOLOG II) cream Apply 1 application topically 2 (two) times daily. Apply to back of neck and scalp   Yes Historical Provider, MD  omeprazole (PRILOSEC) 20 MG capsule Take 20 mg by mouth every morning.    Yes Historical Provider, MD  sertraline (ZOLOFT) 25 MG tablet Take 50 mg by mouth every morning.    Yes Historical Provider, MD  simvastatin (ZOCOR) 40 MG tablet Take 40 mg by mouth at bedtime.   Yes Historical Provider, MD  traZODone (DESYREL) 50 MG tablet Take 50 mg by mouth See admin instructions. 50mg  each evening at bedtime and additional 50mg  one hour after scheduled dose as needed for insomnia   Yes Historical Provider, MD  Wheat Dextrin (BENEFIBER) POWD Take 10 mLs by mouth every morning. Mix in 4 oz of water and drink   Yes Historical Provider, MD  Zinc Oxide (DESITIN CREAMY EX) Apply 1 application topically 4 (four) times daily. Apply to perianal area   Yes Historical Provider, MD  levofloxacin (LEVAQUIN) 500 MG tablet Take 500 mg by mouth daily. For 10 days    Historical Provider, MD   Physical Exam: Filed Vitals:   07/20/13 1406  BP: 99/56  Pulse: 67  Temp: 98.2 F (36.8 C)  Resp: 16   BP 99/56  Pulse 67  Temp(Src) 98.2 F (36.8 C) (Oral)  Resp 16  SpO2 99%  General Appearance:    Alert, cooperative, no distress, appears stated age  Head:    Normocephalic, without obvious abnormality, atraumatic           Throat:   Lips, mucosa, and tongue normal; teeth and gums normal  Neck:   Supple, symmetrical, trachea midline, no adenopathy;       thyroid:  No enlargement/tenderness/nodules; no carotid   bruit or JVD  Back:     Symmetric, no curvature, ROM normal, no CVA tenderness  Lungs:     Clear to auscultation bilaterally, respirations unlabored  Chest wall:    No tenderness or deformity  Heart:    Regular rate and rhythm, S1 and S2 normal, no murmur, rub   or gallop  Abdomen:     Soft, distended, nontender no appreciated masses.          Extremities:   Extremities normal, atraumatic, no cyanosis or edema  Pulses:   2+ and symmetric all extremities  Skin:   Skin color, texture, turgor normal, no rashes or lesions  Lymph nodes:   Cervical, supraclavicular, and axillary nodes normal  Neurologic:   CNII-XII intact. Normal strength, sensation and reflexes      throughout     Labs on Admission:  Basic Metabolic Panel:  Recent Labs Lab 07/16/13 1752 07/20/13 1540  NA 135 135  K 4.3 4.3  CL 103 102  CO2 25 25  GLUCOSE 128* 131*  BUN 33* 34*  CREATININE 1.52* 1.37*  CALCIUM  9.5 9.0   Liver Function Tests: No results found for this basename: AST, ALT, ALKPHOS, BILITOT, PROT, ALBUMIN,  in the last 168 hours No results found for this basename: LIPASE, AMYLASE,  in the last 168 hours No results found for this basename: AMMONIA,  in the last 168 hours CBC:  Recent Labs Lab 07/16/13 1752 07/18/13 0720 07/20/13 1540  WBC 3.0* 3.5* 2.7*  NEUTROABS 2.1  --  1.9  HGB 8.1* 8.5* 7.7*  HCT 25.0* 25.6* 23.9*  MCV 95.4 95.2 94.8  PLT 105* 112* 107*   Cardiac Enzymes: No results found for this basename: CKTOTAL, CKMB, CKMBINDEX, TROPONINI,  in the last 168 hours  BNP (last 3 results) No results found for this basename: PROBNP,  in the last 8760 hours CBG:  Recent Labs Lab 07/16/13 1644 07/16/13 1814 07/18/13 0720 07/18/13 1050  GLUCAP 120* 128* 102* 138*    Radiological Exams on Admission: No results found.  EKG: Independently reviewed.none  Assessment/Plan Generalized weakness due to Macrocytic anemia/pancytopenia: -I will admit him to medical bed, I agree with transfusing him one unit of packed red blood cells, his WBC was positive but brown he relates no melanotic stools. Which had a CBC tomorrow morning. - If his symptoms have improved he could be discharged in the morning. He has a followup appointment on Wednesday with Dr. Clelia Croft not to discuss results of his bone marrow biopsy. - Rectal exam done in  the emergency room was brown WBC positive.  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     Hypertension: - Continue her home medications, stable. - Monitor.   Code Status: full Family Communication: none Disposition Plan: inpatient  Time spent: 70 minutes  Marinda Elk [facility] Pager [phone]  If 7PM-7AM, please contact night-coverage [email or web address] Password [facility] 07/20/2013, 5:59 PM

## 2013-07-21 DIAGNOSIS — R5381 Other malaise: Secondary | ICD-10-CM | POA: Diagnosis not present

## 2013-07-21 DIAGNOSIS — D649 Anemia, unspecified: Secondary | ICD-10-CM | POA: Diagnosis not present

## 2013-07-21 DIAGNOSIS — E119 Type 2 diabetes mellitus without complications: Secondary | ICD-10-CM | POA: Diagnosis not present

## 2013-07-21 LAB — HEMOGLOBIN A1C: Hgb A1c MFr Bld: 6.1 % — ABNORMAL HIGH (ref <5.7)

## 2013-07-21 LAB — CBC
MCH: 30.5 pg (ref 26.0–34.0)
Platelets: 95 10*3/uL — ABNORMAL LOW (ref 150–400)
RBC: 2.82 MIL/uL — ABNORMAL LOW (ref 4.22–5.81)
WBC: 3.3 10*3/uL — ABNORMAL LOW (ref 4.0–10.5)

## 2013-07-21 LAB — GLUCOSE, CAPILLARY

## 2013-07-21 NOTE — Progress Notes (Signed)
Clinical Social Work Department BRIEF PSYCHOSOCIAL ASSESSMENT 07/21/2013  Patient:  [name], [name]     Account Number:  [id number]     Admit date:  07/20/2013  Clinical Social Worker:  Doroteo Glassman  Date/Time:  07/21/2013 02:25 PM  Referred by:  Physician  Date Referred:  07/21/2013 Referred for  Psychosocial assessment   Other Referral:   Interview type:  Patient Other interview type:   Pt's sister, Mrs. Durwin Nora    PSYCHOSOCIAL DATA Living Status:  FACILITY Admitted from facility:  [facility] Level of care:  Assisted Living Primary support name:  Mrs. Durwin Nora Primary support relationship to patient:  SIBLING Degree of support available:   strong    CURRENT CONCERNS Current Concerns  Post-Acute Placement   Other Concerns:    SOCIAL WORK ASSESSMENT / PLAN Per RN, Pt ready for d/c.    Met with Pt and sister.    Sister has serious concerns with Pt's medications and just finished speaking with MD re: Pt's lithium.  CSW spoke with Pt's sister at length re: her concerns about Pt's facility. In the end, Pt's sister ok with Pt to return, however she stated that she will be looking for other facilities for him.  Pt's sister will provide transportation to the facility when confirmation received that they are willing to accept him.  Pt is alert and oriented.    Per MD, lithium to be taken off of Pt's d/c summary.    Sent updated d/c summary.    Receipt confirmed.    Facility ready to receive Pt.    Notified RN.    RN to notify Pt's sister when Pt is ready for d/c.    No further CSW needs.    Pt to be d/c'd.   Assessment/plan status:  No Further Intervention Required Other assessment/ plan:   Information/referral to community resources:   n/a    PATIENT'S/FAMILY'S RESPONSE TO PLAN OF CARE: Pt's sister and Pt frustrated with the ALF and the situation surrounding the lithium.  Pt hopeful that his sister will find a new ALF for him.    Pt and sister thanked  CSW for time and assistance.   Providence Crosby, LCSWA Clinical Social Work [phone]

## 2013-07-21 NOTE — H&P (Deleted)
Physician Discharge Summary  [name] ZOX:[id number] DOB: [birth year] DOA: 07/20/2013  PCP: Florentina Jenny, MD  Admit date: 07/20/2013 Discharge date: 07/21/2013  Time spent: 31 minutes  Recommendations for Outpatient Follow-up:  1. Follow up with Dr. 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 male  Past medical history of cirrhosis due to alcohol abuse, splenomegaly by CT scan in 2014, also pancytopenia status post CT-guided bone marrow biopsy and was referred to the ED as labs were drawn and he had a low hemoglobin. As per patient he has been feeling generalized weakness for the past month progressively getting worse to the point where he can't even walk outside due to the shortness of breath and fatigue. He relates no melanotic stools. No new medication he is being followed by his oncologist Dr. Clelia Croft as an outpatient.   Hospital Course:  Generalized weakness due to Macrocytic anemia/pancytopenia:  - Transfused him one unit of packed red blood cells, his guiac was positive but brown he relates no melanotic stools.  - symptoms resolved Hbg increase to 8.6 - Rectal exam done in the emergency room was brown Guiac positive.   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  - resume back to his home regimen.  Bipolar diserder: - Pt and Healthcare power of attorney, have refused extensively the lithium. - They relate he has not been taking it for 5  days. He has refused it in the hospital. I have explained to them the risk and benefits of not being on lithium and they understand. I have answer all there questions regarding other option (medication), they are not pleased with the side effects lithium causes him and they want to see a psychiatrist as an outpatinet for a 2nd opinion about being on lithium.  Procedures:  None  Consultations:  none  Discharge Exam: Filed Vitals:   07/21/13 0600  BP: 148/73  Pulse: 66  Temp: 98 F (36.7 C)  Resp: 18    General: A&O x3 Cardiovascular: RRR Respiratory: good air movement CTA B/L  Discharge Instructions      Discharge Orders   Future Appointments Provider Department Dept Phone   07/26/2013 4:00 PM Benjiman Core, MD [facility] [phone]   Future Orders Complete By Expires     Diet - low sodium heart healthy  As directed     Increase activity slowly  As directed         Medication List    STOP taking these medications       lithium carbonate 300 MG capsule      TAKE these medications       aspirin 81 MG chewable tablet  Chew 81 mg by mouth every morning.     BENEFIBER Powd  Take 10 mLs by mouth every morning. Mix in 4 oz of water and drink     buPROPion 150 MG 24 hr tablet  Commonly known as:  WELLBUTRIN XL  Take 150 mg by  mouth every morning.     DAILY VITE Tabs  Take 1 tablet by mouth every morning.     DESITIN CREAMY EX  Apply 1 application topically 4 (four) times daily. Apply to perianal area     EARWAX TREATMENT DROPS OT  Place 5 drops into both ears once a week. On Friday     fenofibrate 160 MG tablet  Take 160 mg by mouth every morning.     ferrous sulfate 325 (65 FE) MG tablet  Take 325 mg by mouth daily with breakfast.     ibuprofen 200 MG tablet  Commonly known as:  ADVIL,MOTRIN  Take 400 mg by mouth 3 (three) times daily. 8am, 2pm, 8pm     insulin aspart 100 UNIT/ML injection  Commonly known as:   novoLOG  Inject 25 Units into the skin 3 (three) times daily before meals.     insulin glargine 100 UNIT/ML injection  Commonly known as:  LANTUS  Inject 70 Units into the skin at bedtime.     levofloxacin 500 MG tablet  Commonly known as:  LEVAQUIN  Take 500 mg by mouth daily. For 10 days     Melatonin 10 MG Tabs  Take 10 mg by mouth at bedtime.     nystatin-triamcinolone cream  Commonly known as:  MYCOLOG II  Apply 1 application topically 2 (two) times daily. Apply to back of neck and scalp     omeprazole 20 MG capsule  Commonly known as:  PRILOSEC  Take 20 mg by mouth every morning.     sertraline 25 MG tablet  Commonly known as:  ZOLOFT  Take 50 mg by mouth every morning.     simvastatin 40 MG tablet  Commonly known as:  ZOCOR  Take 40 mg by mouth at bedtime.     traZODone 50 MG tablet  Commonly known as:  DESYREL  Take 50 mg by mouth See admin instructions. 50mg  each evening at bedtime and additional 50mg  one hour after scheduled dose as needed for insomnia     Vitamin D3 1000 UNITS Caps  Take 1,000 Units by mouth every morning.       Allergies  Allergen Reactions  . Penicillins Hives   Follow-up Information   Follow up with Florentina Jenny, MD.   Contact information:   [address] [place] [address] [phone]       Follow up with [facility], MD In 4 days. (hospital follow up)    Contact information:   [address] [place] [address] [phone]        The results of significant diagnostics from this hospitalization (including imaging, microbiology, ancillary and laboratory) are listed below for reference.    Significant Diagnostic Studies: Dg Chest 2 View  07/16/2013   *RADIOLOGY REPORT*  Clinical Data: 31 and weakness  CHEST - 2 VIEW  Comparison: 09/25/2009  Findings: The cardiac silhouette is normal in size and configuration.  The mediastinum is normal in contour caliber.  No hilar masses.  The lungs are clear.  No pleural  effusion or pneumothorax.  The bony thorax is intact.  IMPRESSION: No active disease of the chest.   Original Report Authenticated By: Amie Portland, M.D.   Ct Abdomen Pelvis W Contrast  07/06/2013   **ADDENDUM** CREATED: 07/06/2013 14:04:19  The patient is noted to have recanalization of the umbilical vein. Additionally, there is significant increased in varicosities identified within the left upper quadrant of the abdomen and surrounding the  spleen.  This was discussed with Dr. Clelia Croft.  **END ADDENDUM** SIGNED BY: Rosealee Albee, M.D.  07/06/2013   *RADIOLOGY REPORT*  Clinical Data: Abdominal distention.  Pancytopenia.  CT ABDOMEN AND PELVIS WITH CONTRAST  Technique:  Multidetector CT imaging of the abdomen and pelvis was performed following the standard protocol during bolus administration of intravenous contrast.  Contrast: OMNIPAQUE IOHEXOL 300 MG/ML  SOLN  Comparison: None.  Findings: The lung bases are clear.  No pleural or pericardial effusion.  Normal appearance of the liver.  Gallbladder is normal.  No biliary dilatation.  Normal appearance of the pancreas.  The spleen is enlarged measuring 18.3 cm in craniocaudal dimension, image 49/series 602.  Anterior splenic infarct is identified, image 33/series 2.  The second infarct is noted within the inferior aspect of the spleen, image 45/series 2.  The adrenal glands are normal.  Normal appearance of the kidneys. The urinary bladder appears normal.  The prostate gland and seminal fraction equals are unremarkable.  There is calcified atherosclerotic disease affecting the abdominal aorta.  No aneurysm.  There are no enlarged upper abdominal lymph nodes.  No pelvic adenopathy.  No inguinal adenopathy.  The stomach appears normal.  The small bowel loops are unremarkable.  The appendix is visualized and appears normal. Normal appearance of the colon.  Review of the visualized osseous structures is unremarkable. Periumbilical hernia is identified containing  fat only.  There is a left inguinal hernia which also contains fat.  IMPRESSION:  1.  Marked splenomegaly. 2.  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 3.  No adenopathy noted within the abdomen or pelvis.   Original Report Authenticated By: Signa Kell, M.D.   Ct Biopsy  07/18/2013   *RADIOLOGY REPORT*  Clinical Data: Pancytopenia and splenomegaly.  The patient requires bone marrow biopsy.  CT GUIDED ASPIRATE AND CORE BIOPSY OF RIGHT ILIAC BONE MARROW  Sedation: Versed 1.0 mg IV, Fentanyl 100 mcg IV  Total Moderate Sedation Time: 10 minutes.  Procedure:  The procedure risks, benefits, and alternatives were explained to the patient.  Questions regarding the procedure were encouraged and answered.  The patient understands and consents to the procedure.  The right gluteal region was prepped with Betadine.  Sterile gown and sterile gloves were used for the procedure.  Local anesthesia was provided with 1% Lidocaine.  Under CT guidance, an 11 gauge OnControl bone cutting needle was advanced from a posterior approach into the right iliac bone. Needle positioning was confirmed with CT.  Initial non heparinized and heparinized aspirate samples were obtained of bone marrow.  Core biopsy was performed with the OnControl needle yielding an intact core biopsy sample.  Complications: None  Findings:  Inspection of initial aspirate did reveal visible particles.  Intact core biopsy sample was obtained.  IMPRESSION: CT guided bone marrow biopsy of right posterior iliac bone with both aspirate and core samples obtained.   Original Report Authenticated By: Irish Lack, M.D.    Microbiology: Recent Results (from the past 240 hour(s))  URINE CULTURE     Status: None   Collection Time    07/16/13  5:31 PM      Result Value Range Status   Specimen Description URINE, RANDOM   Final   Special Requests ADDED [id number]   Final   Culture  Setup Time     Final   Value: 07/16/2013 22:10     Performed at [facility] Count     Final  Value: 40,000 COLONIES/ML     Performed at [facility]   Culture     Final   Value: STAPHYLOCOCCUS SPECIES (COAGULASE NEGATIVE)     Note: RIFAMPIN AND GENTAMICIN SHOULD NOT BE USED AS SINGLE DRUGS FOR TREATMENT OF STAPH INFECTIONS.     Performed at [facility]   Report Status 07/19/2013 FINAL   Final   Organism ID, Bacteria STAPHYLOCOCCUS SPECIES (COAGULASE NEGATIVE)   Final     Labs: Basic Metabolic Panel:  Recent Labs Lab 07/16/13 1752 07/20/13 1540  NA 135 135  K 4.3 4.3  CL 103 102  CO2 25 25  GLUCOSE 128* 131*  BUN 33* 34*  CREATININE 1.52* 1.37*  CALCIUM 9.5 9.0   Liver Function Tests: No results found for this basename: AST, ALT, ALKPHOS, BILITOT, PROT, ALBUMIN,  in the last 168 hours No results found for this basename: LIPASE, AMYLASE,  in the last 168 hours No results found for this basename: AMMONIA,  in the last 168 hours CBC:  Recent Labs Lab 07/16/13 1752 07/18/13 0720 07/20/13 1540 07/21/13 0547  WBC 3.0* 3.5* 2.7* 3.3*  NEUTROABS 2.1  --  1.9  --   HGB 8.1* 8.5* 7.7* 8.6*  HCT 25.0* 25.6* 23.9* 26.4*  MCV 95.4 95.2 94.8 93.6  PLT 105* 112* 107* 95*   Cardiac Enzymes: No results found for this basename: CKTOTAL, CKMB, CKMBINDEX, TROPONINI,  in the last 168 hours BNP: BNP (last 3 results) No results found for this basename: PROBNP,  in the last 8760 hours CBG:  Recent Labs Lab 07/18/13 0720 07/18/13 1050 07/20/13 2123 07/21/13 0745 07/21/13 1130  GLUCAP 102* 138* 201* 167* 215*       Signed:  Marinda Elk  [facility] 07/21/2013, 2:14 PM

## 2013-07-21 NOTE — Progress Notes (Signed)
Pt discharged to [facility].  Called report to his nurse there.  [facility] [facility] will pick pt up from hospital and take him there.

## 2013-07-22 NOTE — Discharge Summary (Signed)
Physician Discharge Summary  [name] [name]:[id number] DOB: 11-Oct-1946 DOA: 07/20/2013  PCP: Florentina Jenny, MD  Admit date: 07/20/2013 Discharge date: 07/22/2013  Time spent: 31 minutes  Recommendations for Outpatient Follow-up:  1. Follow up with Dr. 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 male  Past medical history of cirrhosis due to alcohol abuse, splenomegaly by CT scan in 2014, also pancytopenia status post CT-guided bone marrow biopsy and was referred to the ED as labs were drawn and he had a low hemoglobin. As per patient he has been feeling generalized weakness for the past month progressively getting worse to the point where he can't even walk outside due to the shortness of breath and fatigue. He relates no melanotic stools. No new medication he is being followed by his oncologist Dr. Clelia Croft as an outpatient.   Hospital Course:  Generalized weakness due to Macrocytic anemia/pancytopenia:  - Transfused him one unit of packed red blood cells, his guiac was positive but brown he relates no melanotic stools.  - symptoms resolved Hbg increase to 8.6 - Rectal exam done in the emergency room was brown Guiac positive.   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  - resume back to his home regimen.  Bipolar diserder: - Pt and Healthcare power of attorney, have refused extensively the lithium. - They relate he has not been taking it for 5  days. He has refused it in the hospital. I have explained to them the risk and benefits of not being on lithium and they understand. I have answer all there questions regarding other option (medication), they are not pleased with the side effects lithium causes him and they want to see a psychiatrist as an outpatinet for a 2nd opinion about being on lithium.  Procedures:  None  Consultations:  none  Discharge Exam: Filed Vitals:   07/21/13 1400  BP: 133/74  Pulse: 62  Temp: 98.1 F (36.7 C)  Resp: 20    General: A&O x3 Cardiovascular: RRR Respiratory: good air movement CTA B/L  Discharge Instructions      Discharge Orders   Future Appointments Provider Department Dept Phone   07/26/2013 4:00 PM Benjiman Core, MD [facility] [phone]   Future Orders Complete By Expires     Diet - low sodium heart healthy  As directed     Increase activity slowly  As directed         Medication List    STOP taking these medications       lithium carbonate 300 MG capsule      TAKE these medications       aspirin 81 MG chewable tablet  Chew 81 mg by mouth every morning.     BENEFIBER Powd  Take 10 mLs by mouth every morning. Mix in 4 oz of water and drink     buPROPion 150 MG 24 hr tablet  Commonly known as:  WELLBUTRIN XL  Take 150 mg by  mouth every morning.     DAILY VITE Tabs  Take 1 tablet by mouth every morning.     DESITIN CREAMY EX  Apply 1 application topically 4 (four) times daily. Apply to perianal area     EARWAX TREATMENT DROPS OT  Place 5 drops into both ears once a week. On Friday     fenofibrate 160 MG tablet  Take 160 mg by mouth every morning.     ferrous sulfate 325 (65 FE) MG tablet  Take 325 mg by mouth daily with breakfast.     ibuprofen 200 MG tablet  Commonly known as:  ADVIL,MOTRIN  Take 400 mg by mouth 3 (three) times daily. 8am, 2pm, 8pm     insulin aspart 100 UNIT/ML injection  Commonly known as:   novoLOG  Inject 25 Units into the skin 3 (three) times daily before meals.     insulin glargine 100 UNIT/ML injection  Commonly known as:  LANTUS  Inject 70 Units into the skin at bedtime.     levofloxacin 500 MG tablet  Commonly known as:  LEVAQUIN  Take 500 mg by mouth daily. For 10 days     Melatonin 10 MG Tabs  Take 10 mg by mouth at bedtime.     nystatin-triamcinolone cream  Commonly known as:  MYCOLOG II  Apply 1 application topically 2 (two) times daily. Apply to back of neck and scalp     omeprazole 20 MG capsule  Commonly known as:  PRILOSEC  Take 20 mg by mouth every morning.     sertraline 25 MG tablet  Commonly known as:  ZOLOFT  Take 50 mg by mouth every morning.     simvastatin 40 MG tablet  Commonly known as:  ZOCOR  Take 40 mg by mouth at bedtime.     traZODone 50 MG tablet  Commonly known as:  DESYREL  Take 50 mg by mouth See admin instructions. 50mg  each evening at bedtime and additional 50mg  one hour after scheduled dose as needed for insomnia     Vitamin D3 1000 UNITS Caps  Take 1,000 Units by mouth every morning.       Allergies  Allergen Reactions  . Penicillins Hives   Follow-up Information   Follow up with Florentina Jenny, MD.   Contact information:   [address] [place] [address] [phone]       Follow up with [facility], MD In 4 days. (hospital follow up)    Contact information:   [address] [place] [address] [phone]        The results of significant diagnostics from this hospitalization (including imaging, microbiology, ancillary and laboratory) are listed below for reference.    Significant Diagnostic Studies: Dg Chest 2 View  07/16/2013   *RADIOLOGY REPORT*  Clinical Data: 64 and weakness  CHEST - 2 VIEW  Comparison: 09/25/2009  Findings: The cardiac silhouette is normal in size and configuration.  The mediastinum is normal in contour caliber.  No hilar masses.  The lungs are clear.  No pleural  effusion or pneumothorax.  The bony thorax is intact.  IMPRESSION: No active disease of the chest.   Original Report Authenticated By: Amie Portland, M.D.   Ct Abdomen Pelvis W Contrast  07/06/2013   **ADDENDUM** CREATED: 07/06/2013 14:04:19  The patient is noted to have recanalization of the umbilical vein. Additionally, there is significant increased in varicosities identified within the left upper quadrant of the abdomen and surrounding the  spleen.  This was discussed with Dr. Clelia Croft.  **END ADDENDUM** SIGNED BY: Rosealee Albee, M.D.  07/06/2013   *RADIOLOGY REPORT*  Clinical Data: Abdominal distention.  Pancytopenia.  CT ABDOMEN AND PELVIS WITH CONTRAST  Technique:  Multidetector CT imaging of the abdomen and pelvis was performed following the standard protocol during bolus administration of intravenous contrast.  Contrast: OMNIPAQUE IOHEXOL 300 MG/ML  SOLN  Comparison: None.  Findings: The lung bases are clear.  No pleural or pericardial effusion.  Normal appearance of the liver.  Gallbladder is normal.  No biliary dilatation.  Normal appearance of the pancreas.  The spleen is enlarged measuring 18.3 cm in craniocaudal dimension, image 49/series 602.  Anterior splenic infarct is identified, image 33/series 2.  The second infarct is noted within the inferior aspect of the spleen, image 45/series 2.  The adrenal glands are normal.  Normal appearance of the kidneys. The urinary bladder appears normal.  The prostate gland and seminal fraction equals are unremarkable.  There is calcified atherosclerotic disease affecting the abdominal aorta.  No aneurysm.  There are no enlarged upper abdominal lymph nodes.  No pelvic adenopathy.  No inguinal adenopathy.  The stomach appears normal.  The small bowel loops are unremarkable.  The appendix is visualized and appears normal. Normal appearance of the colon.  Review of the visualized osseous structures is unremarkable. Periumbilical hernia is identified containing  fat only.  There is a left inguinal hernia which also contains fat.  IMPRESSION:  1.  Marked splenomegaly. 2.  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 3.  No adenopathy noted within the abdomen or pelvis.   Original Report Authenticated By: Signa Kell, M.D.   Ct Biopsy  07/18/2013   *RADIOLOGY REPORT*  Clinical Data: Pancytopenia and splenomegaly.  The patient requires bone marrow biopsy.  CT GUIDED ASPIRATE AND CORE BIOPSY OF RIGHT ILIAC BONE MARROW  Sedation: Versed 1.0 mg IV, Fentanyl 100 mcg IV  Total Moderate Sedation Time: 10 minutes.  Procedure:  The procedure risks, benefits, and alternatives were explained to the patient.  Questions regarding the procedure were encouraged and answered.  The patient understands and consents to the procedure.  The right gluteal region was prepped with Betadine.  Sterile gown and sterile gloves were used for the procedure.  Local anesthesia was provided with 1% Lidocaine.  Under CT guidance, an 11 gauge OnControl bone cutting needle was advanced from a posterior approach into the right iliac bone. Needle positioning was confirmed with CT.  Initial non heparinized and heparinized aspirate samples were obtained of bone marrow.  Core biopsy was performed with the OnControl needle yielding an intact core biopsy sample.  Complications: None  Findings:  Inspection of initial aspirate did reveal visible particles.  Intact core biopsy sample was obtained.  IMPRESSION: CT guided bone marrow biopsy of right posterior iliac bone with both aspirate and core samples obtained.   Original Report Authenticated By: Irish Lack, M.D.    Microbiology: Recent Results (from the past 240 hour(s))  URINE CULTURE     Status: None   Collection Time    07/16/13  5:31 PM      Result Value Range Status   Specimen Description URINE, RANDOM   Final   Special Requests ADDED [id number]   Final   Culture  Setup Time     Final   Value: 07/16/2013 22:10     Performed at [facility] Count     Final  Value: 40,000 COLONIES/ML     Performed at [facility]   Culture     Final   Value: STAPHYLOCOCCUS SPECIES (COAGULASE NEGATIVE)     Note: RIFAMPIN AND GENTAMICIN SHOULD NOT BE USED AS SINGLE DRUGS FOR TREATMENT OF STAPH INFECTIONS.     Performed at [facility]   Report Status 07/19/2013 FINAL   Final   Organism ID, Bacteria STAPHYLOCOCCUS SPECIES (COAGULASE NEGATIVE)   Final     Labs: Basic Metabolic Panel:  Recent Labs Lab 07/16/13 1752 07/20/13 1540  NA 135 135  K 4.3 4.3  CL 103 102  CO2 25 25  GLUCOSE 128* 131*  BUN 33* 34*  CREATININE 1.52* 1.37*  CALCIUM 9.5 9.0   Liver Function Tests: No results found for this basename: AST, ALT, ALKPHOS, BILITOT, PROT, ALBUMIN,  in the last 168 hours No results found for this basename: LIPASE, AMYLASE,  in the last 168 hours No results found for this basename: AMMONIA,  in the last 168 hours CBC:  Recent Labs Lab 07/16/13 1752 07/18/13 0720 07/20/13 1540 07/21/13 0547  WBC 3.0* 3.5* 2.7* 3.3*  NEUTROABS 2.1  --  1.9  --   HGB 8.1* 8.5* 7.7* 8.6*  HCT 25.0* 25.6* 23.9* 26.4*  MCV 95.4 95.2 94.8 93.6  PLT 105* 112* 107* 95*   Cardiac Enzymes: No results found for this basename: CKTOTAL, CKMB, CKMBINDEX, TROPONINI,  in the last 168 hours BNP: BNP (last 3 results) No results found for this basename: PROBNP,  in the last 8760 hours CBG:  Recent Labs Lab 07/18/13 0720 07/18/13 1050 07/20/13 2123 07/21/13 0745 07/21/13 1130  GLUCAP 102* 138* 201* 167* 215*       Signed:  David Stall, Dorman Calderwood  [facility] 07/22/2013, 7:59 AM

## 2013-07-23 LAB — TYPE AND SCREEN: Unit division: 0

## 2013-07-26 ENCOUNTER — Telehealth: Payer: Self-pay | Admitting: Oncology

## 2013-07-26 ENCOUNTER — Ambulatory Visit (HOSPITAL_BASED_OUTPATIENT_CLINIC_OR_DEPARTMENT_OTHER): Payer: Medicare Other | Admitting: Oncology

## 2013-07-26 VITALS — BP 111/54 | HR 67 | Temp 97.8°F | Resp 20 | Ht 66.0 in | Wt 189.1 lb

## 2013-07-26 DIAGNOSIS — D61818 Other pancytopenia: Secondary | ICD-10-CM

## 2013-07-26 DIAGNOSIS — R161 Splenomegaly, not elsewhere classified: Secondary | ICD-10-CM

## 2013-07-26 DIAGNOSIS — D539 Nutritional anemia, unspecified: Secondary | ICD-10-CM

## 2013-07-26 DIAGNOSIS — D649 Anemia, unspecified: Secondary | ICD-10-CM

## 2013-07-26 NOTE — Progress Notes (Signed)
Hematology and Oncology Follow Up Visit  [name] [id number] 20-Jan-1946 67 y.o. 07/26/2013 4:22 PM Florentina Jenny, MDTripp, [name] Cooter, MD   Principle Diagnosis: 67 year old with pancytopenia diagnosed in 01/2013. 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   Prior therapy: He is status post bone marrow biopsy done on 07/18/2013 which showed no evidence of any myelodysplasia or lymphoproliferative disorder.  Interim History:  [name] presents for a follow up visit. He is a very man I saw in 01/2012 for the above diagnosis. Since that last visit, he failed to show up for for a follow up till 06/2013 where he presented with pancytopenia and abdominal distention. His workup at that time, including a CT scan of the abdomen and pelvis which showed splenomegaly with a lot of varicosities in the upper abdomen as well as splenic infarcts. Based on these findings, I have arranged for him to have a a bone marrow biopsy which she completed on 07/18/2013 and he presents today for a followup visit. He did have some deterioration in his health on required a brief hospitalization between August 8 on 07/21/2013 where he was found to have increased lithium levels. During his hospitalization, he received 1 unit of packed red cell transfusions and his lithium was stopped. Since that time, he has felt a lot better. He is no longer reporting symptoms of abdominal pain or distention. Is not reporting any dizziness or unsteadiness. Is not reporting any chest pain or difficulty breathing.  Medications: I have reviewed the patient's current medications.  Current Outpatient Prescriptions  Medication Sig Dispense Refill  . aspirin 81 MG chewable tablet Chew 81 mg by mouth every morning.       Marland Kitchen buPROPion (WELLBUTRIN XL) 150 MG 24 hr tablet Take 150 mg by mouth every morning.       [name] Peroxide (EARWAX TREATMENT DROPS OT) Place 5 drops into both ears once a week. On Friday      . Cholecalciferol (VITAMIN  D3) 1000 UNITS CAPS Take 1,000 Units by mouth every morning.       . fenofibrate 160 MG tablet Take 160 mg by mouth every morning.       . ferrous sulfate 325 (65 FE) MG tablet Take 325 mg by mouth daily with breakfast.      . HYDROcodone-acetaminophen (NORCO/VICODIN) 5-325 MG per tablet Take 1 tablet by mouth every 6 (six) hours as needed.      Marland Kitchen ibuprofen (ADVIL,MOTRIN) 200 MG tablet Take 400 mg by mouth 3 (three) times daily. 8am, 2pm, 8pm      . insulin aspart (NOVOLOG) 100 UNIT/ML injection Inject 25 Units into the skin 3 (three) times daily before meals.       . insulin glargine (LANTUS) 100 UNIT/ML injection Inject 70 Units into the skin at bedtime.       Marland Kitchen levofloxacin (LEVAQUIN) 500 MG tablet Take 500 mg by mouth daily. For 10 days      . Melatonin 10 MG TABS Take 10 mg by mouth at bedtime.      . Multiple Vitamin (DAILY VITE) TABS Take 1 tablet by mouth every morning.       . nystatin-triamcinolone (MYCOLOG II) cream Apply 1 application topically 2 (two) times daily. Apply to back of neck and scalp      . omeprazole (PRILOSEC) 20 MG capsule Take 20 mg by mouth every morning.       . sertraline (ZOLOFT) 25 MG tablet Take 50 mg  by mouth every morning.       . simvastatin (ZOCOR) 40 MG tablet Take 40 mg by mouth at bedtime.      . traZODone (DESYREL) 50 MG tablet Take 50 mg by mouth See admin instructions. 50mg  each evening at bedtime and additional 50mg  one hour after scheduled dose as needed for insomnia      . Wheat Dextrin (BENEFIBER) POWD Take 10 mLs by mouth every morning. Mix in 4 oz of water and drink      . Zinc Oxide (DESITIN CREAMY EX) Apply 1 application topically 4 (four) times daily. Apply to perianal area      . [DISCONTINUED] zolpidem (AMBIEN) 5 MG tablet Take 5 mg by mouth at bedtime as needed.       No current facility-administered medications for this visit.     Allergies:  Allergies  Allergen Reactions  . Penicillins Hives    Past Medical History, Surgical  history, Social history, and Family History were reviewed and updated.  Review of Systems: Constitutional:  Negative for fever, chills, night sweats, anorexia, weight loss, pain. Cardiovascular: no chest pain or dyspnea on exertion Respiratory: no cough, shortness of breath, or wheezing Neurological: negative Dermatological: negative ENT: negative Skin: Negative. Genito-Urinary: negative Hematological and Lymphatic: negative Breast: negative Musculoskeletal: negative Remaining ROS negative. Physical Exam: Blood pressure 111/54, pulse 67, temperature 97.8 F (36.6 C), temperature source Oral, resp. rate 20, height 5\' 6"  (1.676 m), weight 189 lb 1.6 oz (85.775 kg). ECOG: 2 General appearance: alert and cooperative Head: Normocephalic, without obvious abnormality, atraumatic Neck: no adenopathy, no carotid bruit, no JVD, supple, symmetrical, trachea midline and thyroid not enlarged, symmetric, no tenderness/mass/nodules Lymph nodes: Cervical, supraclavicular, and axillary nodes normal. Heart:regular rate and rhythm, S1, S2 normal, no murmur, click, rub or gallop Lung:chest clear, no wheezing, rales, normal symmetric air entry Abdomin: soft, non-tender, without masses or organomegaly and distended EXT:no erythema, induration, or nodules   Lab Results: Lab Results  Component Value Date   WBC 3.3* 07/21/2013   HGB 8.6* 07/21/2013   HCT 26.4* 07/21/2013   MCV 93.6 07/21/2013   PLT 95* 07/21/2013     Chemistry      Component Value Date/Time   NA 135 07/20/2013 1540   NA 135* 07/04/2013 1522   K 4.3 07/20/2013 1540   K 4.9 07/04/2013 1522   CL 102 07/20/2013 1540   CO2 25 07/20/2013 1540   CO2 24 07/04/2013 1522   BUN 34* 07/20/2013 1540   BUN 34.6* 07/04/2013 1522   CREATININE 1.37* 07/20/2013 1540   CREATININE 1.6* 07/04/2013 1522      Component Value Date/Time   CALCIUM 9.0 07/20/2013 1540   CALCIUM 8.8 07/04/2013 1522   ALKPHOS 73 07/04/2013 1522   ALKPHOS 56 02/21/2013 1256   AST 69* 07/04/2013  1522   AST 59* 02/21/2013 1256   ALT 40 07/04/2013 1522   ALT 36 02/21/2013 1256   BILITOT 0.37 07/04/2013 1522   BILITOT 0.3 02/21/2013 1256       Impression and Plan:  67 year old with the following issues:   1. Pancytopenia: His counts today are not dramatically different from previous checks. His bone marrow biopsy failed to show any hematological conditions. There is no evidence of myelodysplasia or myeloproliferative disorder to explain his cytopenia. 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 He has been referred to gastroenterology for evaluation for possible  GI bleed from a bleeding varices.  2. Splenomegaly: The differential diagnosis discussed today with Mr. Code and his sister accompanied him today. At this point I doubt that he has a hematological disorder but it's always a possibility. The CT scan on the bone marrow biopsy results Coreg and stopped in most likely his splenomegaly and splenic infarcts are likely related to his liver disease.  3. Anemia: We will continue to monitor his counts on a regular basis I will bring him about 4 weeks to check his counts and transfuse as needed. Once his GI workup has been completed, one can consider using growth factor support to boost his red cell production.  [facility], MD 8/14/20144:22 PM

## 2013-07-26 NOTE — Telephone Encounter (Signed)
gv and pritned appt sched and avs for pt...emaild MW to add tx.Marland KitchenMarland KitchenMarland Kitchen

## 2013-07-27 ENCOUNTER — Telehealth: Payer: Self-pay | Admitting: *Deleted

## 2013-07-27 LAB — CHROMOSOME ANALYSIS, BONE MARROW

## 2013-07-27 NOTE — Telephone Encounter (Signed)
Per staff phone call and POF I have schedueld appts.  JMW  

## 2013-07-30 ENCOUNTER — Other Ambulatory Visit (HOSPITAL_COMMUNITY): Payer: Self-pay | Admitting: Gastroenterology

## 2013-07-30 DIAGNOSIS — K769 Liver disease, unspecified: Secondary | ICD-10-CM

## 2013-08-06 ENCOUNTER — Ambulatory Visit (HOSPITAL_COMMUNITY)
Admission: RE | Admit: 2013-08-06 | Discharge: 2013-08-06 | Disposition: A | Discharge status: Home / Self Care | Payer: Medicare Other | Source: Ambulatory Visit | Attending: Gastroenterology | Admitting: Gastroenterology

## 2013-08-06 DIAGNOSIS — K769 Liver disease, unspecified: Secondary | ICD-10-CM | POA: Insufficient documentation

## 2013-08-06 DIAGNOSIS — R161 Splenomegaly, not elsewhere classified: Secondary | ICD-10-CM | POA: Insufficient documentation

## 2013-08-24 ENCOUNTER — Encounter: Payer: Self-pay | Admitting: Physician Assistant

## 2013-08-24 ENCOUNTER — Other Ambulatory Visit (HOSPITAL_BASED_OUTPATIENT_CLINIC_OR_DEPARTMENT_OTHER): Payer: Medicare Other | Admitting: Lab

## 2013-08-24 ENCOUNTER — Telehealth: Payer: Self-pay | Admitting: Oncology

## 2013-08-24 ENCOUNTER — Ambulatory Visit (HOSPITAL_BASED_OUTPATIENT_CLINIC_OR_DEPARTMENT_OTHER): Payer: Medicare Other | Admitting: Physician Assistant

## 2013-08-24 VITALS — BP 123/59 | HR 65 | Temp 96.9°F | Resp 20 | Ht 66.0 in | Wt 185.5 lb

## 2013-08-24 DIAGNOSIS — D61818 Other pancytopenia: Secondary | ICD-10-CM

## 2013-08-24 DIAGNOSIS — D539 Nutritional anemia, unspecified: Secondary | ICD-10-CM

## 2013-08-24 LAB — CBC WITH DIFFERENTIAL/PLATELET
BASO%: 0.6 % (ref 0.0–2.0)
Basophils Absolute: 0 10e3/uL (ref 0.0–0.1)
EOS%: 1.9 % (ref 0.0–7.0)
Eosinophils Absolute: 0 10e3/uL (ref 0.0–0.5)
HCT: 28 % — ABNORMAL LOW (ref 38.4–49.9)
HGB: 9.3 g/dL — ABNORMAL LOW (ref 13.0–17.1)
LYMPH%: 23.7 % (ref 14.0–49.0)
MCH: 31.8 pg (ref 27.2–33.4)
MCHC: 33.2 g/dL (ref 32.0–36.0)
MCV: 95.8 fL (ref 79.3–98.0)
MONO#: 0.2 10e3/uL (ref 0.1–0.9)
MONO%: 7.6 % (ref 0.0–14.0)
NEUT#: 1.5 10e3/uL (ref 1.5–6.5)
NEUT%: 66.2 % (ref 39.0–75.0)
Platelets: 77 10e3/uL — ABNORMAL LOW (ref 140–400)
RBC: 2.92 10e6/uL — ABNORMAL LOW (ref 4.20–5.82)
RDW: 15.2 % — ABNORMAL HIGH (ref 11.0–14.6)
WBC: 2.3 10e3/uL — ABNORMAL LOW (ref 4.0–10.3)
lymph#: 0.5 10e3/uL — ABNORMAL LOW (ref 0.9–3.3)

## 2013-08-24 NOTE — Telephone Encounter (Signed)
gv pt sister appt schedule for November. S/w [facility] @ [facility] re appt after leaving a vm for new pt coord. Per [facility] has been trying for some time to get an appt w/o success. Per Marcelino Duster they have received the information (9/4) and it has been review pt is a level 3 (re urgency) and will be contacted with an appointment within 6/8 wks. Sister aware. No information from this office was sent to [facility] as Marcelino Duster has confirmed they have received and reviewed the information sent by [facility].

## 2013-08-24 NOTE — Progress Notes (Signed)
Hematology and Oncology Follow Up Visit  [name] [id number] 1946-10-04 67 y.o. 08/24/2013 5:00 PM Florentina Jenny, MDTripp, [name], MD   Principle Diagnosis: 67 year old with pancytopenia diagnosed in 01/2013. 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   Prior therapy: He is status post bone marrow biopsy done on 07/18/2013 which showed no evidence of any myelodysplasia or lymphoproliferative disorder.  Interim History:  [name] presents for a follow up visit, accompanied by his sister. He is here to see if he needs further packed red blood cell transfusion. He reports that he feels significantly better since the lithium has been discontinued. His sister reports that he is scheduled to have an upper GI to be done by Dr. Matthias Hughs on 08/31/2013 to look for possible source of bleeding. He otherwise feels well. His appetite is good and he is sleeping well. He was initially seen by Dr. Clelia Croft in 01/2012 for the above diagnosis. He denies any dizziness or gait unsteadiness, chest pain or shortness of breath no cough or hemoptysis.   Medications: I have reviewed the patient's current medications.  Current Outpatient Prescriptions  Medication Sig Dispense Refill  . aspirin 81 MG chewable tablet Chew 81 mg by mouth every morning.       Marland Kitchen buPROPion (WELLBUTRIN XL) 150 MG 24 hr tablet Take 150 mg by mouth every morning.       Josefa Half Peroxide (EARWAX TREATMENT DROPS OT) Place 5 drops into both ears once a week. On Friday      . Cholecalciferol (VITAMIN D3) 1000 UNITS CAPS Take 1,000 Units by mouth every morning.       . fenofibrate 160 MG tablet Take 160 mg by mouth every morning.       . ferrous sulfate 325 (65 FE) MG tablet Take 325 mg by mouth daily with breakfast.      . HYDROcodone-acetaminophen (NORCO/VICODIN) 5-325 MG per tablet Take 1 tablet by mouth every 6 (six) hours as needed.      Marland Kitchen ibuprofen (ADVIL,MOTRIN) 200 MG tablet Take 400 mg by mouth 3 (three) times daily. 8am,  2pm, 8pm      . insulin aspart (NOVOLOG) 100 UNIT/ML injection Inject 25 Units into the skin 3 (three) times daily before meals.       . insulin glargine (LANTUS) 100 UNIT/ML injection Inject 70 Units into the skin at bedtime.       Marland Kitchen levofloxacin (LEVAQUIN) 500 MG tablet Take 500 mg by mouth daily. For 10 days      . Melatonin 10 MG TABS Take 10 mg by mouth at bedtime.      . Multiple Vitamin (DAILY VITE) TABS Take 1 tablet by mouth every morning.       . nystatin-triamcinolone (MYCOLOG II) cream Apply 1 application topically 2 (two) times daily. Apply to back of neck and scalp      . omeprazole (PRILOSEC) 20 MG capsule Take 20 mg by mouth every morning.       . sertraline (ZOLOFT) 25 MG tablet Take 50 mg by mouth every morning.       . simvastatin (ZOCOR) 40 MG tablet Take 40 mg by mouth at bedtime.      . traZODone (DESYREL) 50 MG tablet Take 50 mg by mouth See admin instructions. 50mg  each evening at bedtime and additional 50mg  one hour after scheduled dose as needed for insomnia      . Wheat Dextrin (BENEFIBER) POWD Take 10 mLs by mouth every  morning. Mix in 4 oz of water and drink      . Zinc Oxide (DESITIN CREAMY EX) Apply 1 application topically 4 (four) times daily. Apply to perianal area      . [DISCONTINUED] zolpidem (AMBIEN) 5 MG tablet Take 5 mg by mouth at bedtime as needed.       No current facility-administered medications for this visit.     Allergies:  Allergies  Allergen Reactions  . Penicillins Hives    Past Medical History, Surgical history, Social history, and Family History were reviewed and updated.  Review of Systems: Constitutional:  Negative for fever, chills, night sweats, anorexia, weight loss, pain. Cardiovascular: no chest pain or dyspnea on exertion Respiratory: no cough, shortness of breath, or wheezing Neurological: negative Dermatological: negative ENT: negative Skin: Negative. Genito-Urinary: negative Hematological and Lymphatic:  negative Breast: negative Musculoskeletal: negative Remaining ROS negative. Physical Exam: Blood pressure 123/59, pulse 65, temperature 96.9 F (36.1 C), resp. rate 20, height 5\' 6"  (1.676 m), weight 185 lb 8 oz (84.142 kg). ECOG: 2 General appearance: alert and cooperative Head: Normocephalic, without obvious abnormality, atraumatic Neck: no adenopathy, no carotid bruit, no JVD, supple, symmetrical, trachea midline and thyroid not enlarged, symmetric, no tenderness/mass/nodules Lymph nodes: Cervical, supraclavicular, and axillary nodes normal. Heart:regular rate and rhythm, S1, S2 normal, no murmur, click, rub or gallop Lung:chest clear, no wheezing, rales, normal symmetric air entry Abdomin: soft, nondistended and Splenomegaly is present EXT:no erythema, induration, or nodules   Lab Results: Lab Results  Component Value Date   WBC 2.3* 08/24/2013   HGB 9.3* 08/24/2013   HCT 28.0* 08/24/2013   MCV 95.8 08/24/2013   PLT 77* 08/24/2013     Chemistry      Component Value Date/Time   NA 135 07/20/2013 1540   NA 135* 07/04/2013 1522   K 4.3 07/20/2013 1540   K 4.9 07/04/2013 1522   CL 102 07/20/2013 1540   CO2 25 07/20/2013 1540   CO2 24 07/04/2013 1522   BUN 34* 07/20/2013 1540   BUN 34.6* 07/04/2013 1522   CREATININE 1.37* 07/20/2013 1540   CREATININE 1.6* 07/04/2013 1522      Component Value Date/Time   CALCIUM 9.0 07/20/2013 1540   CALCIUM 8.8 07/04/2013 1522   ALKPHOS 73 07/04/2013 1522   ALKPHOS 56 02/21/2013 1256   AST 69* 07/04/2013 1522   AST 59* 02/21/2013 1256   ALT 40 07/04/2013 1522   ALT 36 02/21/2013 1256   BILITOT 0.37 07/04/2013 1522   BILITOT 0.3 02/21/2013 1256       Impression and Plan:  67 year old with the following issues:   1. Pancytopenia:  His bone marrow biopsy failed to show any hematological conditions. There is no evidence of myelodysplasia or myeloproliferative disorder to explain his cytopenia. Most likely etiology at this time would be due to anemia of renal  disease with thrombocytopenia leukopenia due to liver cirrhosis. His hemoglobin is slightly improved today at 9.3 and he will not require packed red blood cell transfusion. He is scheduled to have an upper endoscopy by Dr. Matthias Hughs on 08/31/2013 to look for possible source of bleeding.   2. Splenomegaly: most likely his splenomegaly and splenic infarcts are likely related to his liver disease.  3. Anemia: We will continue to monitor his counts. Patient to follow with Dr. Clelia Croft.in 2 months with a repeat CBC differential. Patient discussed with Dr. Clelia Croft.  Once his GI workup has been completed, one can consider using growth factor support  to boost his red cell production.  4. Patient will also be referred to nephrology for further evaluation of his renal insufficiency  Conni Slipper, PA-C  9/12/20145:00 PM

## 2013-08-24 NOTE — Patient Instructions (Addendum)
Follow up with Dr. Clelia Croft in 2 months Keep your appointment with Dr. Matthias Hughs for your upper endoscopy

## 2013-08-30 ENCOUNTER — Telehealth: Payer: Self-pay | Admitting: Endocrinology

## 2013-08-30 NOTE — Telephone Encounter (Signed)
Message Given to [place] at [facility] she said they have already given him his insulin. They will have their transportation person call to schedule follow up appt.

## 2013-08-30 NOTE — Telephone Encounter (Signed)
if cbg is over 200, why not give the insulin?  i don't understand F/u ov is due

## 2013-09-11 ENCOUNTER — Ambulatory Visit: Payer: Medicare Other | Admitting: Endocrinology

## 2013-10-17 ENCOUNTER — Encounter: Payer: Self-pay | Admitting: Endocrinology

## 2013-10-17 ENCOUNTER — Other Ambulatory Visit: Payer: Self-pay

## 2013-10-17 ENCOUNTER — Ambulatory Visit (INDEPENDENT_AMBULATORY_CARE_PROVIDER_SITE_OTHER): Payer: Medicare Other | Admitting: Endocrinology

## 2013-10-17 VITALS — BP 122/80 | HR 80 | Wt 191.0 lb

## 2013-10-17 DIAGNOSIS — E1049 Type 1 diabetes mellitus with other diabetic neurological complication: Secondary | ICD-10-CM

## 2013-10-17 DIAGNOSIS — E119 Type 2 diabetes mellitus without complications: Secondary | ICD-10-CM

## 2013-10-17 DIAGNOSIS — I2510 Atherosclerotic heart disease of native coronary artery without angina pectoris: Secondary | ICD-10-CM

## 2013-10-17 NOTE — Patient Instructions (Addendum)
Please check resident's blood sugar 4 times a day--before the 3 meals, and at bedtime.  also check if you have symptoms of your blood sugar being too high or too low.  please keep a record of the readings and bring it to your next appointment here.  please call us sooner if reident's blood sugar goes below 70, or if it stays over 200.  blood tests are being requested for you today.  We'll contact you with results. Please increase the novolog to 3 times a day (just before each meal) 25-25-30 units Also, increase the lantus to 80 units at bedtime.   Please come back for a follow-up appointment in 3 months.

## 2013-10-17 NOTE — Progress Notes (Signed)
Subjective:    Patient ID: [name], male    DOB: Jul 27, 1946, 67 y.o.   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  he brings a record of his cbg's which i have reviewed today.  It varies from 81-400.  It is in general highest at hs, and in am.  pt states he feels well in general. Past Medical History  Diagnosis Date  . Hyperglycemia   . Hyponatremia   . DMII (diabetes mellitus, type 2)   . Depression   . IBS (irritable bowel syndrome)   . Chest pain   . Unspecified gastritis and gastroduodenitis without mention of hemorrhage   . Arthropathy, unspecified, site unspecified   . History of colon polyps   . Anemia, unspecified   . Anxiety   . GERD (gastroesophageal reflux disease)   . Hyperlipidemia   . Hypertension     Past Surgical History  Procedure Laterality Date  . Hernia repair  1964    History   Social History  . Marital Status: Divorced    Spouse Name: N/A    Number of Children: N/A  . Years of Education: N/A   Occupational History  . Not on file.   Social History Main Topics  . Smoking status: Former Smoker -- 1.00 packs/day    Types: Cigarettes  . Smokeless tobacco: Not on file  . Alcohol Use: No  . Drug Use: No  . Sexual Activity: Yes   Other Topics Concern  . Not on file   Social History Narrative  . No narrative on file    Current Outpatient Prescriptions on File Prior to Visit  Medication Sig Dispense Refill  . aspirin 81 MG chewable tablet Chew 81 mg by mouth every morning.       Marland Kitchen buPROPion (WELLBUTRIN XL) 150 MG 24 hr tablet Take 150 mg by mouth every morning.       Josefa Half Peroxide (EARWAX TREATMENT DROPS OT) Place 5 drops into both ears once a week. On Friday      . Cholecalciferol (VITAMIN D3) 1000 UNITS CAPS  Take 1,000 Units by mouth every morning.       . fenofibrate 160 MG tablet Take 160 mg by mouth every morning.       . ferrous sulfate 325 (65 FE) MG tablet Take 325 mg by mouth daily with breakfast.      . HYDROcodone-acetaminophen (NORCO/VICODIN) 5-325 MG per tablet Take 1 tablet by mouth every 6 (six) hours as needed.      Marland Kitchen ibuprofen (ADVIL,MOTRIN) 200 MG tablet Take 400 mg by mouth 3 (three) times daily. 8am, 2pm, 8pm      . insulin aspart (NOVOLOG) 100 UNIT/ML injection 3 times a day (just before each meal) 25 units with breakfast, 25 units with lunch, and 30 units with supper      . insulin glargine (LANTUS) 100 UNIT/ML injection Inject 80 Units into the skin at bedtime.       Marland Kitchen levofloxacin (LEVAQUIN) 500 MG tablet Take 500 mg by mouth daily. For 10 days      . Melatonin 10 MG TABS Take 10 mg by mouth at bedtime.      . Multiple Vitamin (DAILY VITE) TABS Take 1 tablet by mouth every morning.       Marland Kitchen  nystatin-triamcinolone (MYCOLOG II) cream Apply 1 application topically 2 (two) times daily. Apply to back of neck and scalp      . omeprazole (PRILOSEC) 20 MG capsule Take 20 mg by mouth every morning.       . sertraline (ZOLOFT) 25 MG tablet Take 50 mg by mouth every morning.       . simvastatin (ZOCOR) 40 MG tablet Take 40 mg by mouth at bedtime.      . traZODone (DESYREL) 50 MG tablet Take 50 mg by mouth See admin instructions. 50mg  each evening at bedtime and additional 50mg  one hour after scheduled dose as needed for insomnia      . Wheat Dextrin (BENEFIBER) POWD Take 10 mLs by mouth every morning. Mix in 4 oz of water and drink      . Zinc Oxide (DESITIN CREAMY EX) Apply 1 application topically 4 (four) times daily. Apply to perianal area      . [DISCONTINUED] zolpidem (AMBIEN) 5 MG tablet Take 5 mg by mouth at bedtime as needed.       No current facility-administered medications on file prior to visit.    Allergies  Allergen Reactions  . Penicillins Hives    Family History   Problem Relation Age of Onset  . Stroke Mother   . Heart attack Father   . Heart disease Father   . Cancer Other     FH of Breast Cancer-other Relative  . Heart disease Other     Parent  . Hypertension Other     parent  . Cancer Maternal Uncle     colon   BP 122/80  Pulse 80  Wt 191 lb (86.637 kg)  SpO2 97%  Review of Systems denies hypoglycemia and weight change    Objective:   Physical Exam VITAL SIGNS:  See vs page GENERAL: no distress  Lab Results  Component Value Date   HGBA1C 9.3* 10/17/2013      Assessment & Plan:  DM: control is worse: This insulin regimen was chosen from multiple options, as it best matches his insulin to his changing requirements throughout the day.  The benefits of glycemic control must be weighed against the risks of hypoglycemia.   CAD: in this context, he should avoid hypoglycemia. Neuropathy: this limits exercise rx of DM.

## 2013-10-24 ENCOUNTER — Ambulatory Visit (HOSPITAL_BASED_OUTPATIENT_CLINIC_OR_DEPARTMENT_OTHER): Payer: Medicare Other | Admitting: Oncology

## 2013-10-24 ENCOUNTER — Other Ambulatory Visit (HOSPITAL_BASED_OUTPATIENT_CLINIC_OR_DEPARTMENT_OTHER): Payer: Medicare Other | Admitting: Lab

## 2013-10-24 VITALS — BP 121/63 | HR 77 | Temp 98.1°F | Resp 18 | Ht 66.0 in | Wt 188.4 lb

## 2013-10-24 DIAGNOSIS — D539 Nutritional anemia, unspecified: Secondary | ICD-10-CM

## 2013-10-24 DIAGNOSIS — D61818 Other pancytopenia: Secondary | ICD-10-CM

## 2013-10-24 DIAGNOSIS — D649 Anemia, unspecified: Secondary | ICD-10-CM

## 2013-10-24 DIAGNOSIS — R161 Splenomegaly, not elsewhere classified: Secondary | ICD-10-CM

## 2013-10-24 LAB — CBC WITH DIFFERENTIAL/PLATELET
Basophils Absolute: 0 10*3/uL (ref 0.0–0.1)
Eosinophils Absolute: 0.1 10*3/uL (ref 0.0–0.5)
HGB: 9 g/dL — ABNORMAL LOW (ref 13.0–17.1)
LYMPH%: 20 % (ref 14.0–49.0)
MCV: 93.3 fL (ref 79.3–98.0)
MONO%: 8 % (ref 0.0–14.0)
NEUT#: 1.5 10*3/uL (ref 1.5–6.5)
Platelets: 73 10*3/uL — ABNORMAL LOW (ref 140–400)
RDW: 14.1 % (ref 11.0–14.6)

## 2013-10-24 LAB — COMPREHENSIVE METABOLIC PANEL (CC13)
Albumin: 3.1 g/dL — ABNORMAL LOW (ref 3.5–5.0)
Alkaline Phosphatase: 59 U/L (ref 40–150)
BUN: 20.1 mg/dL (ref 7.0–26.0)
Calcium: 9.4 mg/dL (ref 8.4–10.4)
Glucose: 315 mg/dl — ABNORMAL HIGH (ref 70–140)
Potassium: 5.2 mEq/L — ABNORMAL HIGH (ref 3.5–5.1)

## 2013-10-24 NOTE — Progress Notes (Signed)
Hematology and Oncology Follow Up Visit  [name] [id number] [birth year] 67 y.o. 10/24/2013 8:24 AM Florentina Jenny, MDTripp, [name], MD   Principle Diagnosis: 67 year old with pancytopenia diagnosed in 01/2013. 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   Prior therapy: He is status post bone marrow biopsy done on 07/18/2013 which showed no evidence of any myelodysplasia or lymphoproliferative disorder.  Interim History:  [name] presents for a follow up visit. He is a very man I saw in 01/2012 for the above diagnosis. He presents today for a followup visit with his sister. Since his last visit, he has felt a lot better. He is no longer reporting symptoms of abdominal pain or distention. Is not reporting any dizziness or unsteadiness. Is not reporting any chest pain or difficulty breathing.  He is eating better with any Gi complaints.   Medications: I have reviewed the patient's current medications.  Current Outpatient Prescriptions  Medication Sig Dispense Refill  . aspirin 81 MG chewable tablet Chew 81 mg by mouth every morning.       Marland Kitchen buPROPion (WELLBUTRIN XL) 150 MG 24 hr tablet Take 150 mg by mouth every morning.       Josefa Half Peroxide (EARWAX TREATMENT DROPS OT) Place 5 drops into both ears once a week. On Friday      . Cholecalciferol (VITAMIN D3) 1000 UNITS CAPS Take 1,000 Units by mouth every morning.       . fenofibrate 160 MG tablet Take 160 mg by mouth every morning.       . ferrous sulfate 325 (65 FE) MG tablet Take 325 mg by mouth daily with breakfast.      . HYDROcodone-acetaminophen (NORCO/VICODIN) 5-325 MG per tablet Take 1 tablet by mouth every 6 (six) hours as needed.      Marland Kitchen ibuprofen (ADVIL,MOTRIN) 200 MG tablet Take 400 mg by mouth 3 (three) times daily. 8am, 2pm, 8pm      . insulin aspart (NOVOLOG) 100 UNIT/ML injection 3 times a day (just before each meal) 25 units with breakfast, 25 units with lunch, and 30 units with supper      . insulin  glargine (LANTUS) 100 UNIT/ML injection Inject 80 Units into the skin at bedtime.       Marland Kitchen levofloxacin (LEVAQUIN) 500 MG tablet Take 500 mg by mouth daily. For 10 days      . Melatonin 10 MG TABS Take 10 mg by mouth at bedtime.      . Multiple Vitamin (DAILY VITE) TABS Take 1 tablet by mouth every morning.       . nystatin-triamcinolone (MYCOLOG II) cream Apply 1 application topically 2 (two) times daily. Apply to back of neck and scalp      . omeprazole (PRILOSEC) 20 MG capsule Take 20 mg by mouth every morning.       . sertraline (ZOLOFT) 25 MG tablet Take 50 mg by mouth every morning.       . simvastatin (ZOCOR) 40 MG tablet Take 40 mg by mouth at bedtime.      . traZODone (DESYREL) 50 MG tablet Take 50 mg by mouth See admin instructions. 50mg  each evening at bedtime and additional 50mg  one hour after scheduled dose as needed for insomnia      . Wheat Dextrin (BENEFIBER) POWD Take 10 mLs by mouth every morning. Mix in 4 oz of water and drink      . Zinc Oxide (DESITIN CREAMY EX) Apply 1 application topically  4 (four) times daily. Apply to perianal area      . [DISCONTINUED] zolpidem (AMBIEN) 5 MG tablet Take 5 mg by mouth at bedtime as needed.       No current facility-administered medications for this visit.     Allergies:  Allergies  Allergen Reactions  . Penicillins Hives    Past Medical History, Surgical history, Social history, and Family History were reviewed and updated.  Review of SystemsConstitutional:  Negative for fever, chills, night sweats, anorexia, weight loss, pain.  Remaining ROS negative. Physical Exam: Blood pressure 121/63, pulse 77, temperature 98.1 F (36.7 C), temperature source Oral, resp. rate 18, height 5\' 6"  (1.676 m), weight 188 lb 6.4 oz (85.458 kg). ECOG: 2 General appearance: alert and cooperative Head: Normocephalic, without obvious abnormality, atraumatic Neck: no adenopathy, no carotid bruit, no JVD, supple, symmetrical, trachea midline and  thyroid not enlarged, symmetric, no tenderness/mass/nodules Lymph nodes: Cervical, supraclavicular, and axillary nodes normal. Heart:regular rate and rhythm, S1, S2 normal, no murmur, click, rub or gallop Lung:chest clear, no wheezing, rales, normal symmetric air entry Abdomin: soft, non-tender, without masses or organomegaly and distended EXT:no erythema, induration, or nodules   Lab Results: Lab Results  Component Value Date   WBC 2.3* 08/24/2013   HGB 9.3* 08/24/2013   HCT 28.0* 08/24/2013   MCV 95.8 08/24/2013   PLT 77* 08/24/2013     Chemistry      Component Value Date/Time   NA 135 07/20/2013 1540   NA 135* 07/04/2013 1522   K 4.3 07/20/2013 1540   K 4.9 07/04/2013 1522   CL 102 07/20/2013 1540   CO2 25 07/20/2013 1540   CO2 24 07/04/2013 1522   BUN 34* 07/20/2013 1540   BUN 34.6* 07/04/2013 1522   CREATININE 1.37* 07/20/2013 1540   CREATININE 1.6* 07/04/2013 1522      Component Value Date/Time   CALCIUM 9.0 07/20/2013 1540   CALCIUM 8.8 07/04/2013 1522   ALKPHOS 73 07/04/2013 1522   ALKPHOS 56 02/21/2013 1256   AST 69* 07/04/2013 1522   AST 59* 02/21/2013 1256   ALT 40 07/04/2013 1522   ALT 36 02/21/2013 1256   BILITOT 0.37 07/04/2013 1522   BILITOT 0.3 02/21/2013 1256       Impression and Plan:  67 year old with the following issues:   1. Pancytopenia: His counts today are pending. His bone marrow biopsy failed to show any hematological conditions. There is no evidence of myelodysplasia or myeloproliferative disorder to explain his cytopenia. 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 He has been evaluated by gastroenterology regarding that.  2. Splenomegaly: The differential diagnosis discussed today with [name] and his sister accompanied him today. At this point I doubt that he has a hematological disorder but it's always a possibility. The CT scan on the bone marrow biopsy results do not suggest  the presence of a lymphoproliferative disorder. 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  3. Anemia: We will continue to monitor his counts on a regular basis I will bring him about 8 weeks to check his counts and transfuse as needed. We can consider using growth factor support to boost his red cell production if needed in the future.   [facility], MD 11/12/20148:24 AM

## 2013-10-25 ENCOUNTER — Telehealth: Payer: Self-pay | Admitting: Oncology

## 2013-10-25 NOTE — Telephone Encounter (Signed)
S/w pt re appt for 12/25/13. Message to FS to see if pt was to return to lb today. No order but pof states send pt to lb.

## 2013-10-27 ENCOUNTER — Emergency Department (HOSPITAL_COMMUNITY): Payer: Medicare Other

## 2013-10-27 ENCOUNTER — Emergency Department (HOSPITAL_COMMUNITY)
Admission: EM | Admit: 2013-10-27 | Discharge: 2013-10-27 | Disposition: A | Discharge status: Home / Self Care | Payer: Medicare Other | Attending: Emergency Medicine | Admitting: Emergency Medicine

## 2013-10-27 DIAGNOSIS — S0003XA Contusion of scalp, initial encounter: Secondary | ICD-10-CM | POA: Insufficient documentation

## 2013-10-27 DIAGNOSIS — M129 Arthropathy, unspecified: Secondary | ICD-10-CM | POA: Insufficient documentation

## 2013-10-27 DIAGNOSIS — F411 Generalized anxiety disorder: Secondary | ICD-10-CM | POA: Insufficient documentation

## 2013-10-27 DIAGNOSIS — F3289 Other specified depressive episodes: Secondary | ICD-10-CM | POA: Insufficient documentation

## 2013-10-27 DIAGNOSIS — W06XXXA Fall from bed, initial encounter: Secondary | ICD-10-CM | POA: Insufficient documentation

## 2013-10-27 DIAGNOSIS — E119 Type 2 diabetes mellitus without complications: Secondary | ICD-10-CM | POA: Insufficient documentation

## 2013-10-27 DIAGNOSIS — Z860100 Personal history of colon polyps, unspecified: Secondary | ICD-10-CM | POA: Insufficient documentation

## 2013-10-27 DIAGNOSIS — Z7982 Long term (current) use of aspirin: Secondary | ICD-10-CM | POA: Insufficient documentation

## 2013-10-27 DIAGNOSIS — I10 Essential (primary) hypertension: Secondary | ICD-10-CM | POA: Insufficient documentation

## 2013-10-27 DIAGNOSIS — Z791 Long term (current) use of non-steroidal anti-inflammatories (NSAID): Secondary | ICD-10-CM | POA: Insufficient documentation

## 2013-10-27 DIAGNOSIS — Z880 Allergy status to penicillin: Secondary | ICD-10-CM | POA: Insufficient documentation

## 2013-10-27 DIAGNOSIS — D649 Anemia, unspecified: Secondary | ICD-10-CM | POA: Insufficient documentation

## 2013-10-27 DIAGNOSIS — E785 Hyperlipidemia, unspecified: Secondary | ICD-10-CM | POA: Insufficient documentation

## 2013-10-27 DIAGNOSIS — Z79899 Other long term (current) drug therapy: Secondary | ICD-10-CM | POA: Insufficient documentation

## 2013-10-27 DIAGNOSIS — Y9389 Activity, other specified: Secondary | ICD-10-CM | POA: Insufficient documentation

## 2013-10-27 DIAGNOSIS — K219 Gastro-esophageal reflux disease without esophagitis: Secondary | ICD-10-CM | POA: Insufficient documentation

## 2013-10-27 DIAGNOSIS — Z794 Long term (current) use of insulin: Secondary | ICD-10-CM | POA: Insufficient documentation

## 2013-10-27 DIAGNOSIS — Z87891 Personal history of nicotine dependence: Secondary | ICD-10-CM | POA: Insufficient documentation

## 2013-10-27 DIAGNOSIS — W19XXXA Unspecified fall, initial encounter: Secondary | ICD-10-CM

## 2013-10-27 DIAGNOSIS — Y92009 Unspecified place in unspecified non-institutional (private) residence as the place of occurrence of the external cause: Secondary | ICD-10-CM | POA: Insufficient documentation

## 2013-10-27 DIAGNOSIS — F329 Major depressive disorder, single episode, unspecified: Secondary | ICD-10-CM | POA: Insufficient documentation

## 2013-10-27 LAB — GLUCOSE, CAPILLARY: Glucose-Capillary: 194 mg/dL — ABNORMAL HIGH (ref 70–99)

## 2013-10-27 NOTE — ED Notes (Signed)
Pt arrived to ED with a complaint of a fall. Pt is a resident of [facility].  Pt went to bed and got too close to the edge of the bed and fell off and hit his head.  Pt is a diabetic who states his last blood sugar was 302.

## 2013-10-27 NOTE — ED Provider Notes (Signed)
CSN: [id number]     Arrival date & time 10/27/13  2029 History   First MD Initiated Contact with Patient 10/27/13 2031     Chief Complaint  Patient presents with  . Fall   (Consider location/radiation/quality/duration/timing/severity/associated sxs/prior Treatment) HPI Patient presents after a fall with pain on his right or head. Patient states that he rolled from his bed and was struck a piece of furniture with his forehead. No loss of consciousness, no subsequent visual changes, vomiting, confusion, disorientation.  He has been present since the event.   Past Medical History  Diagnosis Date  . Hyperglycemia   . Hyponatremia   . DMII (diabetes mellitus, type 2)   . Depression   . IBS (irritable bowel syndrome)   . Chest pain   . Unspecified gastritis and gastroduodenitis without mention of hemorrhage   . Arthropathy, unspecified, site unspecified   . History of colon polyps   . Anemia, unspecified   . Anxiety   . GERD (gastroesophageal reflux disease)   . Hyperlipidemia   . Hypertension    Past Surgical History  Procedure Laterality Date  . Hernia repair  1964   Family History  Problem Relation Age of Onset  . Stroke Mother   . Heart attack Father   . Heart disease Father   . Cancer Other     FH of Breast Cancer-other Relative  . Heart disease Other     Parent  . Hypertension Other     parent  . Cancer Maternal Uncle     colon   History  Substance Use Topics  . Smoking status: Former Smoker -- 1.00 packs/day    Types: Cigarettes  . Smokeless tobacco: Not on file  . Alcohol Use: No    Review of Systems  All other systems reviewed and are negative.    Allergies  Penicillins  Home Medications   Current Outpatient Rx  Name  Route  Sig  Dispense  Refill  . aspirin 81 MG chewable tablet   Oral   Chew 81 mg by mouth every morning.          Marland Kitchen buPROPion (WELLBUTRIN XL) 150 MG 24 hr tablet   Oral   Take 150 mg by mouth every morning.          Josefa Half Peroxide (EARWAX TREATMENT DROPS OT)   Both Ears   Place 5 drops into both ears once a week. On Friday         . Cholecalciferol (VITAMIN D3) 1000 UNITS CAPS   Oral   Take 1,000 Units by mouth every morning.          . fenofibrate 160 MG tablet   Oral   Take 160 mg by mouth every morning.          . ferrous sulfate 325 (65 FE) MG tablet   Oral   Take 325 mg by mouth daily with breakfast.         . HYDROcodone-acetaminophen (NORCO/VICODIN) 5-325 MG per tablet   Oral   Take 1 tablet by mouth every 6 (six) hours as needed.         Marland Kitchen ibuprofen (ADVIL,MOTRIN) 200 MG tablet   Oral   Take 400 mg by mouth 3 (three) times daily. 8am, 2pm, 8pm         . insulin aspart (NOVOLOG) 100 UNIT/ML injection      3 times a day (just before each meal) 25 units with breakfast, 25 units with  lunch, and 30 units with supper         . insulin glargine (LANTUS) 100 UNIT/ML injection   Subcutaneous   Inject 80 Units into the skin at bedtime.          Marland Kitchen levofloxacin (LEVAQUIN) 500 MG tablet   Oral   Take 500 mg by mouth daily. For 10 days         . levothyroxine (SYNTHROID, LEVOTHROID) 25 MCG tablet   Oral   Take 25 mcg by mouth daily.         Marland Kitchen lisinopril (PRINIVIL,ZESTRIL) 5 MG tablet   Oral   Take 5 mg by mouth daily.         . Melatonin 10 MG TABS   Oral   Take 10 mg by mouth at bedtime.         . Multiple Vitamin (DAILY VITE) TABS   Oral   Take 1 tablet by mouth every morning.          . nystatin-triamcinolone (MYCOLOG II) cream   Topical   Apply 1 application topically 2 (two) times daily. Apply to back of neck and scalp         . omeprazole (PRILOSEC) 20 MG capsule   Oral   Take 20 mg by mouth every morning.          . sertraline (ZOLOFT) 25 MG tablet   Oral   Take 50 mg by mouth every morning.          . simvastatin (ZOCOR) 40 MG tablet   Oral   Take 40 mg by mouth at bedtime.         . traZODone (DESYREL) 50 MG tablet    Oral   Take 50 mg by mouth See admin instructions. 50mg  each evening at bedtime and additional 50mg  one hour after scheduled dose as needed for insomnia         . Wheat Dextrin (BENEFIBER) POWD   Oral   Take 10 mLs by mouth every morning. Mix in 4 oz of water and drink         . Zinc Oxide (DESITIN CREAMY EX)   Apply externally   Apply 1 application topically 4 (four) times daily. Apply to perianal area          BP 136/49  Pulse 75  Temp(Src) 98.7 F (37.1 C) (Oral)  Resp 18  SpO2 98% Physical Exam  Nursing note and vitals reviewed. Constitutional: He is oriented to person, place, and time. He appears well-developed. No distress.  HENT:  Head: Normocephalic and atraumatic.    Eyes: Conjunctivae and EOM are normal. Pupils are equal, round, and reactive to light.  Neck: Full passive range of motion without pain. Neck supple.  Cardiovascular: Normal rate and regular rhythm.   Pulmonary/Chest: Effort normal. No stridor. No respiratory distress.  Abdominal: He exhibits no distension.  Musculoskeletal: He exhibits no edema.  Neurological: He is alert and oriented to person, place, and time. He displays no atrophy and no tremor. He exhibits normal muscle tone. He displays no seizure activity. Coordination normal.  Skin: Skin is warm and dry.  Psychiatric: He has a normal mood and affect.    ED Course  Procedures (including critical care time) Labs Review Labs Reviewed  GLUCOSE, CAPILLARY - Abnormal; Notable for the following:    Glucose-Capillary 194 (*)    All other components within normal limits   Imaging Review No results found.  EKG Interpretation  None       MDM  No diagnosis found. This patient presents from his assisted living facility after head trauma.  With the notable lesion on the superficial skull, the patient had CT scan to rule out intracranial pathology.  This was negative, reassuring.  Patient remained hemodynamically stable throughout his  emergency department course, was discharged in stable condition.    Gerhard Munch, MD 10/27/13 2102

## 2013-10-27 NOTE — ED Notes (Signed)
[facility] called to transport pt at this time. 

## 2013-10-27 NOTE — ED Notes (Signed)
Bed: WA06 Expected date:  Expected time:  Means of arrival:  Comments: EMS 67yo M, fall, laceration

## 2013-10-29 ENCOUNTER — Telehealth: Payer: Self-pay | Admitting: Medical Oncology

## 2013-10-29 ENCOUNTER — Telehealth: Payer: Self-pay | Admitting: Oncology

## 2013-10-29 NOTE — Telephone Encounter (Signed)
Per staff message response from FS pt did not need to rtn to lb 11/12.

## 2013-10-29 NOTE — Telephone Encounter (Signed)
Ms Durwin Nora, sister, called regarding a message to pt for upcoming appt. States calls regarding appts need to be made to her d/t patient forgets and she keeps up with his appts. States someone called and gave him next appt but he forgot, she was f/u. Informed sister that patient is scheduled 12/25/2013 @ 1245 for labs and 1:15 to see MD. Sister confirmed appts and knows to call office with any questions or concerns.

## 2013-11-06 ENCOUNTER — Encounter (HOSPITAL_COMMUNITY): Payer: Self-pay

## 2013-11-26 ENCOUNTER — Telehealth: Payer: Self-pay | Admitting: *Deleted

## 2013-11-26 NOTE — Telephone Encounter (Signed)
sw pt made him aware that [facility] will be on call 1.13.15. gv appts for 12/28/13 w/ labs@ 12;15p and ov@ 1:15pm. Pt is aware...td

## 2013-12-25 ENCOUNTER — Other Ambulatory Visit: Payer: Medicare Other

## 2013-12-25 ENCOUNTER — Ambulatory Visit: Payer: Medicare Other | Admitting: Oncology

## 2013-12-26 ENCOUNTER — Telehealth: Payer: Self-pay | Admitting: Oncology

## 2013-12-26 NOTE — Telephone Encounter (Signed)
pt sister called and r/s 1/16 appt to 2/3. sister has new d/t and per sister she will contact facility and update information at 2/3 appt. per sister she should be contacted re appts not pt and appts need to be on tues/thurs.

## 2013-12-28 ENCOUNTER — Ambulatory Visit: Payer: Medicare Other | Admitting: Oncology

## 2013-12-28 ENCOUNTER — Other Ambulatory Visit: Payer: Medicare Other

## 2014-01-04 ENCOUNTER — Emergency Department (HOSPITAL_COMMUNITY): Payer: Medicare Other

## 2014-01-04 ENCOUNTER — Emergency Department (HOSPITAL_COMMUNITY)
Admission: EM | Admit: 2014-01-04 | Discharge: 2014-01-09 | Disposition: A | Discharge status: Skilled Nursing Facility | Payer: Medicare Other | Attending: Emergency Medicine | Admitting: Emergency Medicine

## 2014-01-04 ENCOUNTER — Encounter (HOSPITAL_COMMUNITY): Payer: Self-pay | Admitting: Emergency Medicine

## 2014-01-04 DIAGNOSIS — Z860100 Personal history of colon polyps, unspecified: Secondary | ICD-10-CM | POA: Insufficient documentation

## 2014-01-04 DIAGNOSIS — Z79899 Other long term (current) drug therapy: Secondary | ICD-10-CM | POA: Insufficient documentation

## 2014-01-04 DIAGNOSIS — F411 Generalized anxiety disorder: Secondary | ICD-10-CM | POA: Diagnosis not present

## 2014-01-04 DIAGNOSIS — F329 Major depressive disorder, single episode, unspecified: Secondary | ICD-10-CM | POA: Diagnosis not present

## 2014-01-04 DIAGNOSIS — M129 Arthropathy, unspecified: Secondary | ICD-10-CM | POA: Insufficient documentation

## 2014-01-04 DIAGNOSIS — Z794 Long term (current) use of insulin: Secondary | ICD-10-CM | POA: Diagnosis not present

## 2014-01-04 DIAGNOSIS — Z5989 Other problems related to housing and economic circumstances: Secondary | ICD-10-CM

## 2014-01-04 DIAGNOSIS — F3289 Other specified depressive episodes: Secondary | ICD-10-CM | POA: Insufficient documentation

## 2014-01-04 DIAGNOSIS — F319 Bipolar disorder, unspecified: Secondary | ICD-10-CM | POA: Diagnosis not present

## 2014-01-04 DIAGNOSIS — Z880 Allergy status to penicillin: Secondary | ICD-10-CM | POA: Insufficient documentation

## 2014-01-04 DIAGNOSIS — Z87891 Personal history of nicotine dependence: Secondary | ICD-10-CM | POA: Diagnosis not present

## 2014-01-04 DIAGNOSIS — R079 Chest pain, unspecified: Secondary | ICD-10-CM | POA: Insufficient documentation

## 2014-01-04 DIAGNOSIS — I10 Essential (primary) hypertension: Secondary | ICD-10-CM | POA: Insufficient documentation

## 2014-01-04 DIAGNOSIS — Z598 Other problems related to housing and economic circumstances: Secondary | ICD-10-CM

## 2014-01-04 DIAGNOSIS — K219 Gastro-esophageal reflux disease without esophagitis: Secondary | ICD-10-CM | POA: Insufficient documentation

## 2014-01-04 DIAGNOSIS — Z599 Problem related to housing and economic circumstances, unspecified: Secondary | ICD-10-CM | POA: Insufficient documentation

## 2014-01-04 DIAGNOSIS — E785 Hyperlipidemia, unspecified: Secondary | ICD-10-CM | POA: Insufficient documentation

## 2014-01-04 DIAGNOSIS — E119 Type 2 diabetes mellitus without complications: Secondary | ICD-10-CM | POA: Insufficient documentation

## 2014-01-04 DIAGNOSIS — D649 Anemia, unspecified: Secondary | ICD-10-CM | POA: Diagnosis not present

## 2014-01-04 LAB — COMPREHENSIVE METABOLIC PANEL
ALK PHOS: 64 U/L (ref 39–117)
ALT: 39 U/L (ref 0–53)
AST: 52 U/L — ABNORMAL HIGH (ref 0–37)
Albumin: 3.3 g/dL — ABNORMAL LOW (ref 3.5–5.2)
BILIRUBIN TOTAL: 0.4 mg/dL (ref 0.3–1.2)
BUN: 26 mg/dL — AB (ref 6–23)
CHLORIDE: 102 meq/L (ref 96–112)
CO2: 23 meq/L (ref 19–32)
CREATININE: 1.17 mg/dL (ref 0.50–1.35)
Calcium: 9.3 mg/dL (ref 8.4–10.5)
GFR, EST AFRICAN AMERICAN: 73 mL/min — AB (ref >90)
GFR, EST NON AFRICAN AMERICAN: 63 mL/min — AB (ref >90)
GLUCOSE: 197 mg/dL — AB (ref 70–99)
POTASSIUM: 5.2 meq/L (ref 3.7–5.3)
Sodium: 136 mEq/L — ABNORMAL LOW (ref 137–147)
Total Protein: 8.8 g/dL — ABNORMAL HIGH (ref 6.0–8.3)

## 2014-01-04 LAB — CBC
HEMATOCRIT: 31.2 % — AB (ref 39.0–52.0)
HEMOGLOBIN: 10.3 g/dL — AB (ref 13.0–17.0)
MCH: 29.3 pg (ref 26.0–34.0)
MCHC: 33 g/dL (ref 30.0–36.0)
MCV: 88.9 fL (ref 78.0–100.0)
Platelets: 74 10*3/uL — ABNORMAL LOW (ref 150–400)
RBC: 3.51 MIL/uL — ABNORMAL LOW (ref 4.22–5.81)
RDW: 14.7 % (ref 11.5–15.5)
WBC: 2.8 10*3/uL — AB (ref 4.0–10.5)

## 2014-01-04 LAB — POCT I-STAT TROPONIN I
TROPONIN I, POC: 0 ng/mL (ref 0.00–0.08)
TROPONIN I, POC: 0.01 ng/mL (ref 0.00–0.08)

## 2014-01-04 LAB — GLUCOSE, CAPILLARY: Glucose-Capillary: 171 mg/dL — ABNORMAL HIGH (ref 70–99)

## 2014-01-04 LAB — RAPID URINE DRUG SCREEN, HOSP PERFORMED
Amphetamines: NOT DETECTED
Barbiturates: NOT DETECTED
Benzodiazepines: NOT DETECTED
COCAINE: NOT DETECTED
OPIATES: NOT DETECTED
TETRAHYDROCANNABINOL: NOT DETECTED

## 2014-01-04 LAB — SALICYLATE LEVEL: Salicylate Lvl: <2 mg/dL — ABNORMAL LOW (ref 2.8–20.0)

## 2014-01-04 LAB — ACETAMINOPHEN LEVEL: Acetaminophen (Tylenol), Serum: <15 ug/mL (ref 10–30)

## 2014-01-04 LAB — ETHANOL: Alcohol, Ethyl (B): <11 mg/dL (ref 0–11)

## 2014-01-04 MED ORDER — ALPRAZOLAM 0.25 MG PO TABS
0.2500 mg | ORAL_TABLET | Freq: Once | ORAL | Status: AC
  Administered 2014-01-04: 0.25 mg via ORAL
  Filled 2014-01-04: qty 1

## 2014-01-04 MED ORDER — ALUM & MAG HYDROXIDE-SIMETH 200-200-20 MG/5ML PO SUSP: 30.0000 mL | ORAL | Status: DC | PRN

## 2014-01-04 MED ORDER — IBUPROFEN 200 MG PO TABS: 600.0000 mg | ORAL_TABLET | Freq: Three times a day (TID) | ORAL | Status: DC | PRN

## 2014-01-04 MED ORDER — LORAZEPAM 1 MG PO TABS: 1.0000 mg | ORAL_TABLET | Freq: Three times a day (TID) | ORAL | Status: DC | PRN

## 2014-01-04 MED ORDER — ONDANSETRON HCL 4 MG PO TABS: 4.0000 mg | ORAL_TABLET | Freq: Three times a day (TID) | ORAL | Status: DC | PRN

## 2014-01-04 MED ORDER — ZOLPIDEM TARTRATE 5 MG PO TABS: 5.0000 mg | ORAL_TABLET | Freq: Every evening | ORAL | Status: DC | PRN

## 2014-01-04 MED ORDER — NICOTINE 21 MG/24HR TD PT24: 21.0000 mg | MEDICATED_PATCH | Freq: Every day | TRANSDERMAL | Status: DC

## 2014-01-04 MED ORDER — ALPRAZOLAM 0.25 MG PO TABS: 0.2500 mg | ORAL_TABLET | Freq: Every evening | ORAL | Status: DC | PRN

## 2014-01-04 NOTE — ED Provider Notes (Signed)
CSN: [id number]     Arrival date & time 01/04/14  1535 History   First MD Initiated Contact with Patient 01/04/14 1545     Chief Complaint  Patient presents with  . Medical Clearance  . Chest Pain   (Consider location/radiation/quality/duration/timing/severity/associated sxs/prior Treatment) HPI Comments: Patient is a 68 year old male with a past medical history of PTSD, depression, anxiety, hypertension, diabetes and hyperlipidemia who presents to the emergency department from [facility] by [employer] because he "said something upsetting to one of the staff members about how they were seeing other patients but not him". [employer] is not present with patient, however on arrival to the ED mentioned to the ED police officer that patient was acting combative at the group home and "talking all crazy". Once the police arrived, patient's told him that he was experiencing chest pain. Patient states around 2:00 PM he began to have a left-sided chest pain lasting about 20 minutes which then subsided on its own. States he has had chest pain like this in the past but is unable to recall what it was from. Denies associated nausea, vomiting, diaphoresis or shortness of breath. Denies suicidal or homicidal ideations. Denies any alcohol or drug use. 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  Patient is a 68 y.o. male presenting with chest pain. The history is provided by the patient.  Chest Pain   Past Medical History  Diagnosis Date  . Hyperglycemia   . Hyponatremia   . DMII (diabetes mellitus, type 2)   . Depression   . IBS (irritable bowel syndrome)   . Chest pain   . Unspecified gastritis and gastroduodenitis without mention of hemorrhage   . Arthropathy, unspecified, site unspecified   . History of colon polyps   . Anemia, unspecified   . Anxiety   . GERD (gastroesophageal reflux disease)   . Hyperlipidemia   . Hypertension    Past  Surgical History  Procedure Laterality Date  . Hernia repair  1964   Family History  Problem Relation Age of Onset  . Stroke Mother   . Heart attack Father   . Heart disease Father   . Cancer Other     FH of Breast Cancer-other Relative  . Heart disease Other     Parent  . Hypertension Other     parent  . Cancer Maternal Uncle     colon   History  Substance Use Topics  . Smoking status: Former Smoker -- 1.00 packs/day    Types: Cigarettes  . Smokeless tobacco: Not on file  . Alcohol Use: No    Review of Systems  Cardiovascular: Positive for chest pain.  Psychiatric/Behavioral: Positive for behavioral problems.  All other systems reviewed and are negative.    Allergies  Penicillins  Home Medications   Current Outpatient Rx  Name  Route  Sig  Dispense  Refill  . buPROPion (WELLBUTRIN XL) 150 MG 24 hr tablet   Oral   Take 150 mg by mouth every morning.          . Cholecalciferol (VITAMIN D3) 1000 UNITS CAPS   Oral   Take 1,000 Units by mouth every morning.          . fenofibrate 160 MG tablet   Oral   Take 160 mg by mouth every morning.          . ferrous sulfate 325 (65 FE) MG tablet   Oral  Take 325 mg by mouth daily with breakfast.         . FLUoxetine (PROZAC) 40 MG capsule   Oral   Take 40 mg by mouth daily.         Marland Kitchen guaifenesin (ROBITUSSIN) 100 MG/5ML syrup   Oral   Take 30 mLs by mouth every 6 (six) hours as needed for cough.         Marland Kitchen HYDROcodone-acetaminophen (NORCO/VICODIN) 5-325 MG per tablet   Oral   Take 1 tablet by mouth every 6 (six) hours as needed.         . insulin aspart (NOVOLOG) 100 UNIT/ML injection      3 times a day (just before each meal) 25 units with breakfast, 25 units with lunch, and 30 units with supper         . insulin glargine (LANTUS) 100 UNIT/ML injection   Subcutaneous   Inject 80 Units into the skin at bedtime.          Marland Kitchen levothyroxine (SYNTHROID, LEVOTHROID) 25 MCG tablet   Oral   Take  25 mcg by mouth daily.         Marland Kitchen lisinopril (PRINIVIL,ZESTRIL) 5 MG tablet   Oral   Take 5 mg by mouth daily.         . meclizine (ANTIVERT) 25 MG tablet               . Melatonin 10 MG TABS   Oral   Take 10 mg by mouth at bedtime.         . Multiple Vitamin (DAILY VITE) TABS   Oral   Take 1 tablet by mouth every morning.          . nystatin (MYCOSTATIN/NYSTOP) 100000 UNIT/GM POWD   Topical   Apply topically as needed.         . nystatin-triamcinolone (MYCOLOG II) cream   Topical   Apply 1 application topically 2 (two) times daily. Apply to back of neck and scalp         . omeprazole (PRILOSEC) 20 MG capsule   Oral   Take 20 mg by mouth every morning.          . polyethylene glycol (MIRALAX / GLYCOLAX) packet   Oral   Take 17 g by mouth daily.         . simvastatin (ZOCOR) 40 MG tablet   Oral   Take 40 mg by mouth at bedtime.         . tamsulosin (FLOMAX) 0.4 MG CAPS capsule               . traZODone (DESYREL) 50 MG tablet   Oral   Take 25 mg by mouth at bedtime.         . Wheat Dextrin (BENEFIBER) POWD   Oral   Take 10 mLs by mouth every morning. Mix in 4 oz of water and drink         . Zinc Oxide (DESITIN CREAMY EX)   Apply externally   Apply 1 application topically 4 (four) times daily. Apply to perianal area          There were no vitals taken for this visit. Physical Exam  Nursing note and vitals reviewed. Constitutional: He is oriented to person, place, and time. He appears well-developed and well-nourished. No distress.  HENT:  Head: Normocephalic and atraumatic.  Mouth/Throat: Oropharynx is clear and moist.  Eyes: Conjunctivae are normal.  Neck: Normal  range of motion. Neck supple.  Cardiovascular: Normal rate, regular rhythm and normal heart sounds.   Pulmonary/Chest: Effort normal and breath sounds normal.  Abdominal: Soft. Bowel sounds are normal. There is no tenderness.  Musculoskeletal: Normal range of motion.  He exhibits no edema.  Neurological: He is alert and oriented to person, place, and time.  Skin: Skin is warm and dry. He is not diaphoretic.  Psychiatric: His mood appears anxious. His speech is tangential. He expresses no homicidal and no suicidal ideation.    ED Course  Procedures (including critical care time) Labs Review Labs Reviewed  CBC - Abnormal; Notable for the following:    WBC 2.8 (*)    RBC 3.51 (*)    Hemoglobin 10.3 (*)    HCT 31.2 (*)    Platelets 74 (*)    All other components within normal limits  COMPREHENSIVE METABOLIC PANEL - Abnormal; Notable for the following:    Sodium 136 (*)    Glucose, Bld 197 (*)    BUN 26 (*)    Total Protein 8.8 (*)    Albumin 3.3 (*)    AST 52 (*)    GFR calc non Af Amer 63 (*)    GFR calc Af Amer 73 (*)    All other components within normal limits  SALICYLATE LEVEL - Abnormal; Notable for the following:    Salicylate Lvl <2.0 (*)    All other components within normal limits  GLUCOSE, CAPILLARY - Abnormal; Notable for the following:    Glucose-Capillary 171 (*)    All other components within normal limits  URINE RAPID DRUG SCREEN (HOSP PERFORMED)  ETHANOL  ACETAMINOPHEN LEVEL  POCT I-STAT TROPONIN I  POCT I-STAT TROPONIN I   Imaging Review Dg Chest 2 View  01/04/2014   CLINICAL DATA:  Chest pain.  EXAM: CHEST  2 VIEW  COMPARISON:  Chest x-ray 07/16/2013.  FINDINGS: Mediastinum and hilar structures are normal. Lungs are clear. Heart size normal. No pleural effusion or pneumothorax. Degenerative changes thoracic spine.  IMPRESSION: No active cardiopulmonary disease.   Electronically Signed   By: Maisie Fushomas  Register   On: 01/04/2014 16:49    EKG Interpretation    Date/Time:  Friday January 04 2014 16:10:55 EST Ventricular Rate:  75 PR Interval:  157 QRS Duration: 86 QT Interval:  395 QTC Calculation: 441 R Axis:   -19 Text Interpretation:  Sinus rhythm Probable LVH with secondary repol abnrm Anterior Q waves, possibly due  to LVH No significant change since last tracing Confirmed by YAO  MD, DAVID [phone]([id number]) on 01/04/2014 4:33:02 PM            MDM   1. Chest pain   2. Living accommodation issues     Pt presenting for med clearance after [facility], also complaining of 20 minutes of CP which has since subsided. He is well appearing, anxious but in NAD, no SI/HI. Tangential speech. Labs pending. EKG, CXR pending. 4:27 PM Sister now present. States [facility]. States he did not know how to deal with his diabetes and aching all of his health problems. A few months ago his blood sugar was over 400 and they continued to give him a few glasses of orange juice. Today when he went to the nurse to tell them that her chest pain, he was told that he did not have time to deal with him, and then patient went "ballistic".  Sister states patient does tend to get ballistic, however would never harm anybody. She is trying to get him into another facility, however is having difficulty. I will consult social work. 5:56 PM Kristen with social work spoke with patient and sister, resources given for help to find new placement. He cannot be placed today. Plan to check 4 hour troponin, if normal, will d/c back to [facility]. 9:28 PM 4 hour troponin negative. Chest pain has not returned. Stable for d/c back to [facility]. Return precautions given. Patient states understanding of treatment care plan and is agreeable. Case discussed with attending Dr. Silverio Lay who also evaluated patient and agrees with plan of care.   Trevor Mace, PA-C 01/04/14 2129

## 2014-01-04 NOTE — Progress Notes (Signed)
CSW received consult from PA due to patient and patient sister being unhappy with patient care at facility. CSW provided pt with obudsman information. Pt states he is willing to return as he is aware that sister is trying to find further placement for patient. CSW also provided an alf list. Pt to return to alf when medically stable. Per PA patient did not need a psych evaluation as patient did not seem to be a threat to anyone. RN to call facility upon discharge, if further needs arise please consult on call social work or tts for psych eval.   Olga CoasterKristen Aiko Belko, LCSW [phone]  ED CSW .01/04/2014 1724pm

## 2014-01-04 NOTE — Discharge Instructions (Signed)
Chest Pain (Nonspecific) °It is often hard to give a specific diagnosis for the cause of chest pain. There is always a chance that your pain could be related to something serious, such as a heart attack or a blood clot in the lungs. You need to follow up with your caregiver for further evaluation. °CAUSES  °· Heartburn. °· Pneumonia or bronchitis. °· Anxiety or stress. °· Inflammation around your heart (pericarditis) or lung (pleuritis or pleurisy). °· A blood clot in the lung. °· A collapsed lung (pneumothorax). It can develop suddenly on its own (spontaneous pneumothorax) or from injury (trauma) to the chest. °· Shingles infection (herpes zoster virus). °The chest wall is composed of bones, muscles, and cartilage. Any of these can be the source of the pain. °· The bones can be bruised by injury. °· The muscles or cartilage can be strained by coughing or overwork. °· The cartilage can be affected by inflammation and become sore (costochondritis). °DIAGNOSIS  °Lab tests or other studies, such as X-rays, electrocardiography, stress testing, or cardiac imaging, may be needed to find the cause of your pain.  °TREATMENT  °· Treatment depends on what may be causing your chest pain. Treatment may include: °· Acid blockers for heartburn. °· Anti-inflammatory medicine. °· Pain medicine for inflammatory conditions. °· Antibiotics if an infection is present. °· You may be advised to change lifestyle habits. This includes stopping smoking and avoiding alcohol, caffeine, and chocolate. °· You may be advised to keep your head raised (elevated) when sleeping. This reduces the chance of acid going backward from your stomach into your esophagus. °· Most of the time, nonspecific chest pain will improve within 2 to 3 days with rest and mild pain medicine. °HOME CARE INSTRUCTIONS  °· If antibiotics were prescribed, take your antibiotics as directed. Finish them even if you start to feel better. °· For the next few days, avoid physical  activities that bring on chest pain. Continue physical activities as directed. °· Do not smoke. °· Avoid drinking alcohol. °· Only take over-the-counter or prescription medicine for pain, discomfort, or fever as directed by your caregiver. °· Follow your caregiver's suggestions for further testing if your chest pain does not go away. °· Keep any follow-up appointments you made. If you do not go to an appointment, you could develop lasting (chronic) problems with pain. If there is any problem keeping an appointment, you must call to reschedule. °SEEK MEDICAL CARE IF:  °· You think you are having problems from the medicine you are taking. Read your medicine instructions carefully. °· Your chest pain does not go away, even after treatment. °· You develop a rash with blisters on your chest. °SEEK IMMEDIATE MEDICAL CARE IF:  °· You have increased chest pain or pain that spreads to your arm, neck, jaw, back, or abdomen. °· You develop shortness of breath, an increasing cough, or you are coughing up blood. °· You have severe back or abdominal pain, feel nauseous, or vomit. °· You develop severe weakness, fainting, or chills. °· You have a fever. °THIS IS AN EMERGENCY. Do not wait to see if the pain will go away. Get medical help at once. Call your local emergency services (911 in [place] Do not drive yourself to the hospital. °MAKE SURE YOU:  °· Understand these instructions. °· Will watch your condition. °· Will get help right away if you are not doing well or get worse. °Document Released: 09/08/2005 Document Revised: 02/21/2012 Document Reviewed: 07/04/2008 °ExitCare® Patient Information ©2014 [employer]   LL[employer]. ° °

## 2014-01-04 NOTE — ED Notes (Signed)
Attempted to call SW x 4. No response. AC notified and [facility] called [phone]. No answer after several attempts.

## 2014-01-04 NOTE — BH Assessment (Signed)
LCSW spoke with Dr Silverio LayYao as psych assessment called in; Dr Silverio LayYao and PA report psych assessment cancelled.  [name] Bernatherine C Athony Coppa, LCSW

## 2014-01-04 NOTE — ED Provider Notes (Addendum)
Medical screening examination/treatment/procedure(s) were conducted as a shared visit with non-physician practitioner(s) and myself.  I personally evaluated the patient during the encounter.  EKG Interpretation    Date/Time:  Friday January 04 2014 16:10:55 EST Ventricular Rate:  75 PR Interval:  157 QRS Duration: 86 QT Interval:  395 QTC Calculation: 441 R Axis:   -19 Text Interpretation:  Sinus rhythm Probable LVH with secondary repol abnrm Anterior Q waves, possibly due to LVH No significant change since last tracing Confirmed by YAO  MD, DAVID [phone]([id number]) on 01/04/2014 4:33:02 PM            [name] is a 68 y.o. male hx of PTSD< depression, anxiety, DM here with chest pain. Chest pain this morning. He said that he was upset today and felt ignored by his nurse. Denies history of CAD. He was calm on my exam. Heart and lung exam unremarkable. Labs and CXR and trop neg x 2. He and his sister wanted to transfer to another facility. We called social work but was unable to get him to another place. Will transfer back to same facility. However, the facility doesn't want to take him back. Social work attempted to contact them but they refused. Will need to try and find placement.    Richardean Canalavid H Yao, MD 01/04/14 2314  Richardean Canalavid H Yao, MD 01/05/14 [id number]

## 2014-01-04 NOTE — ED Notes (Signed)
Patient transported to X-ray 
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
## 2014-01-04 NOTE — ED Notes (Signed)
[facility] Expected date:  Expected time:  Means of arrival:  Comments: [employer]-med clearance

## 2014-01-04 NOTE — ED Notes (Signed)
Spoke to pt's sister, Leonides GrillsCeleste [name], She was angry and uncooperative stating that she was "not going to be responsible for him." She also stated that she would not speak to [facility] and that we would need to find some other place for him to stay. Pt has no other family or place to stay at this time that we are aware of.

## 2014-01-04 NOTE — ED Notes (Signed)
Pt reports had left side chest pain at 1400 today, lasted about 30 minutes. Denies any other sx. No pain at this time. Pt [facility] and [facility] because he "said something upsetting to one of the staff members about how they were seeing other patients but not him." No [employer] with pt at this time.

## 2014-01-05 DIAGNOSIS — F3289 Other specified depressive episodes: Secondary | ICD-10-CM

## 2014-01-05 DIAGNOSIS — F411 Generalized anxiety disorder: Secondary | ICD-10-CM

## 2014-01-05 DIAGNOSIS — F329 Major depressive disorder, single episode, unspecified: Secondary | ICD-10-CM
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
## 2014-01-05 NOTE — ED Notes (Signed)
He is ambulatory in our hallway and remains calm and cooperative.  He tells us of his tenure in the [employer].  He is quite lucid.  He has the overall demeanor of one who has mild m.r.

## 2014-01-05 NOTE — ED Notes (Addendum)
Spoke with Sr. Administrator, [name], at [facility]. She clearly stated that they would not take [name] back at their facility under any circumstances.  She also stated that they served him his discharge papers when he left to come to the ED and that she understood he was now homeless, however, they would not accept him back. [name] also stated that her Social Worker, [name], was aware of the situation and in complete agreement that [name] not be allowed to return the the facility.

## 2014-01-05 NOTE — ED Notes (Signed)
He remains in no distress.  We have been informed that pt. Will not be placed in a facility today, of which pt. Is aware.
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
## 2014-01-05 NOTE — ED Notes (Signed)
He is awake, alert and chipper; enquiring when breakfast may be.  I inform him that we have ordered his breakfast and it should be here shortly.  He is ambulatory without difficulty and is in no distress.

## 2014-01-05 NOTE — ED Notes (Signed)
His breakfast arrived as he was speaking with our Child psychotherapistsocial worker.  Also, our C.N. And [employer] officer have had conversations with his nursing home staff about possibly repatriating him--thus far I am told, they decline his readmission to their facility [facility]

## 2014-01-05 NOTE — Progress Notes (Addendum)
CSW met with pt. Pt stated he is willing to return to facility if they will take him.   CSW called [facility]. When CSW first called, woman answering phone stated that "we don't have a resident by that name." CSW called back, administrator Niger picked up. Niger stated that facility was unwilling to accept pt. Stated pt was served with an immediate d/c due to violent behavior--states that this was in his "discharge plan" and that they had documentation of his outbursts. CSW requested these documents. Niger stated that she would call CSW back in 15 minutes.    CSW to await call from Niger within 15 minutes.  [place], [phone]     ED CSW  10:10am  ________  Admin Niger called CSW back and refused to send additional documentation. Admin stated that facility refuses to take pt back. CSW informed admin that this was abandonment and CSW would have to file report with the state. Niger stated she understood and would not take pt back.   CSW to speak with MD to have pt put up for d/c.  [place], [phone]     ED CSW  11am  ____________  CSW spoke with sister. Sister states she is unable to bring him home and care for him. States she will offer financial support. States she has looked into other homes, including [facility].   CSW called APS for contact information for MD Dwyane Dee. Supervisor is Charolette Child, [phone]. MD has offered to speak with DSS to get clarity around facility's immediate d/c.  [facility] does not have ALF, and pt does not currently meet criteria for SNF or inpatient.   CSW awaiting psych consult note. Pt needs to be psychiatrically cleared to be under consideration at another ALF.   [place], [place]     ED CSW  __________  CSW awaiting fax from [facility] of pt's FL2. CSW has called x3, and each time been told fax is on the way. When CSW just called, Lauren stated she was sending it right away.  [place], [phone]     ED  CSW  3:25pm __________  CSW received FL2.  CSW spoke with AD CSW, who stated that pt cannot be placed from ED. CSW requested d/c from MD. CSW called family and informed that pt has been d/c. CSW provided instructions on how they can look for placement in the community.  Family stated they are unable to care for pt and take him. CSW informed that an abandonment report would be filed.   [place], [place]     ED CSW  4:45pm _____________  CSW filed APS report with worker Myriam Jacobson.  [place], [place]     ED CSW  5:15pm

## 2014-01-05 NOTE — ED Notes (Signed)
Pt has excoriation to groin area and buttocks.  Incontinence cleanser used as well as protective barrier cream applied.

## 2014-01-05 NOTE — ED Notes (Signed)
We have had frequent contact with pt., given his room is directly across from my desk.  Psychiatry has seen him (Dr. Park BreedKahn); and our social worker has been in for several conversations with him.  He remains quite active and ambulatory ad lib without difficulty.

## 2014-01-06 DIAGNOSIS — R079 Chest pain, unspecified: Secondary | ICD-10-CM | POA: Diagnosis not present

## 2014-01-06 LAB — GLUCOSE, CAPILLARY
GLUCOSE-CAPILLARY: 347 mg/dL — AB (ref 70–99)
Glucose-Capillary: 415 mg/dL — ABNORMAL HIGH (ref 70–99)
Glucose-Capillary: 327 mg/dL — ABNORMAL HIGH (ref 70–99)

## 2014-01-06 MED ORDER — INSULIN ASPART 100 UNIT/ML ~~LOC~~ SOLN
0.0000 [IU] | Freq: Three times a day (TID) | SUBCUTANEOUS | Status: DC
  Administered 2014-01-06: 11 [IU] via SUBCUTANEOUS
  Administered 2014-01-06 – 2014-01-07 (×2): 15 [IU] via SUBCUTANEOUS
  Administered 2014-01-07: 8 [IU] via SUBCUTANEOUS
  Administered 2014-01-07 – 2014-01-08 (×2): 15 [IU] via SUBCUTANEOUS
  Administered 2014-01-08: 5 [IU] via SUBCUTANEOUS
  Filled 2014-01-06 (×7): qty 1

## 2014-01-06 MED ORDER — FERROUS SULFATE 325 (65 FE) MG PO TABS
325.0000 mg | ORAL_TABLET | Freq: Every day | ORAL | Status: DC
  Administered 2014-01-07 – 2014-01-09 (×3): 325 mg via ORAL
  Filled 2014-01-06 (×4): qty 1

## 2014-01-06 MED ORDER — ZOLPIDEM TARTRATE 5 MG PO TABS
5.0000 mg | ORAL_TABLET | Freq: Once | ORAL | Status: AC
  Administered 2014-01-06: 5 mg via ORAL
  Filled 2014-01-06: qty 1

## 2014-01-06 MED ORDER — TRAZODONE HCL 50 MG PO TABS
75.0000 mg | ORAL_TABLET | Freq: Every day | ORAL | Status: DC
  Administered 2014-01-07 – 2014-01-08 (×3): 75 mg via ORAL
  Filled 2014-01-06 (×3): qty 2

## 2014-01-06 MED ORDER — BUPROPION HCL ER (XL) 150 MG PO TB24
150.0000 mg | ORAL_TABLET | Freq: Every morning | ORAL | Status: DC
  Administered 2014-01-06 – 2014-01-09 (×4): 150 mg via ORAL
  Filled 2014-01-06 (×4): qty 1

## 2014-01-06 MED ORDER — LEVOTHYROXINE SODIUM 25 MCG PO TABS
25.0000 ug | ORAL_TABLET | Freq: Every day | ORAL | Status: DC
  Administered 2014-01-06 – 2014-01-09 (×4): 25 ug via ORAL
  Filled 2014-01-06 (×5): qty 1

## 2014-01-06 MED ORDER — HYDROCORTISONE 1 % EX CREA
TOPICAL_CREAM | Freq: Two times a day (BID) | CUTANEOUS | Status: DC | PRN
  Administered 2014-01-06: 22:00:00 via TOPICAL
  Filled 2014-01-06: qty 28

## 2014-01-06 MED ORDER — INSULIN ASPART 100 UNIT/ML ~~LOC~~ SOLN
4.0000 [IU] | Freq: Three times a day (TID) | SUBCUTANEOUS | Status: DC
  Administered 2014-01-06 – 2014-01-08 (×7): 4 [IU] via SUBCUTANEOUS
  Filled 2014-01-06 (×6): qty 1

## 2014-01-06 MED ORDER — PANTOPRAZOLE SODIUM 40 MG PO TBEC
40.0000 mg | DELAYED_RELEASE_TABLET | Freq: Every day | ORAL | Status: DC
  Administered 2014-01-06 – 2014-01-09 (×4): 40 mg via ORAL
  Filled 2014-01-06 (×4): qty 1

## 2014-01-06 MED ORDER — FLUOXETINE HCL 20 MG PO CAPS
40.0000 mg | ORAL_CAPSULE | Freq: Every day | ORAL | Status: DC
  Administered 2014-01-06 – 2014-01-09 (×4): 40 mg via ORAL
  Filled 2014-01-06 (×4): qty 2

## 2014-01-06 MED ORDER — INSULIN ASPART 100 UNIT/ML ~~LOC~~ SOLN
0.0000 [IU] | Freq: Every day | SUBCUTANEOUS | Status: DC
  Administered 2014-01-06: 4 [IU] via SUBCUTANEOUS
  Administered 2014-01-07: 5 [IU] via SUBCUTANEOUS
  Filled 2014-01-06 (×2): qty 1

## 2014-01-06 MED ORDER — ATORVASTATIN CALCIUM 20 MG PO TABS
20.0000 mg | ORAL_TABLET | Freq: Every day | ORAL | Status: DC
  Administered 2014-01-06 – 2014-01-08 (×3): 20 mg via ORAL
  Filled 2014-01-06 (×4): qty 1

## 2014-01-06 NOTE — ED Notes (Signed)
CBG= 347  Nurse notified

## 2014-01-06 NOTE — ED Notes (Signed)
Pt has been very pleasant and cooperative this shift. Only sleeping in small increments despite 5mg  ambien given. Pt up to the bathroom multiple times w/o assist, steady gait. No c/o.

## 2014-01-06 NOTE — Progress Notes (Addendum)
CSW spoke with MD Wofford this AM about pt status. Summary below. Please continue to check this same note throughout the day for addendum updates.  [facility] refuses to [facility] and cites that Domenica Fail, worker at [employer], has backed decision that pt cannot return. Pt t is not a ward of the county. [facility] state they are unable to take him to jail due to his lack of OT capacity (ability to make arrangements and care for self). Family refuses to pick pt up. CSW has filed abadonment report with APS.CSW has currently been advised by AD CSW and Dr. Reynaldo Minium not to work on ALF placement from ED.  CSW has called [facility] this AM 3x and left message 2x. CSW calling to get plan from facility as to how they will assist pt find further placement, as this is their duty per APS. CSW to speak with family again today.  [place],     ED CSW  phone: [phone] 9:00am ___________  CSW met with pt to provide update. Pt states he used to be a resident of [facility] in [place] (public housing for elderly/disabled) before he became sick and entered into [place] [facility], then  into a SNF ([facility]), then an ALF ([facility]). Pt volunteered that he would be willing to go to a homeless shelter. CSW called [employer], but no availability today.  CSW to continue to update.  [place],     ED CSW  phone: [phone] 11:30am  _______  CSW spoke with pt sister. Like yesterday, sister states she cannot bring pt into her home. CSW informed her that ED would not pursue placement. CSW provided education around finding an ALF from community, procuring FL2 from [employer], as well as serving as a Scientist, research (physical sciences) witness and gathering references from his therapist, past nursing homes, etc. CSW also provided sister contact information to patient advocate, as well as number for APS to file a report herself.   CSW informed sister that pt could be d/c in the next day or so, when bed availability at a shelter  opens up.   [place],     ED CSW  phone: [phone] 4:10pm _________  CSW spoke with pt, to update him on situation.  Pt stated he would like to be placed in a facility or into HUD housing, but he understands if this is not possible. Pt is aware that d/c to homeless shelter is current d/c plan.  [place],     ED CSW  phone: [phone] 4:56pm

## 2014-01-07 DIAGNOSIS — R079 Chest pain, unspecified: Secondary | ICD-10-CM | POA: Diagnosis not present

## 2014-01-07 LAB — GLUCOSE, CAPILLARY
Glucose-Capillary: 300 mg/dL — ABNORMAL HIGH (ref 70–99)
Glucose-Capillary: 361 mg/dL — ABNORMAL HIGH (ref 70–99)
Glucose-Capillary: 375 mg/dL — ABNORMAL HIGH (ref 70–99)
Glucose-Capillary: 399 mg/dL — ABNORMAL HIGH (ref 70–99)

## 2014-01-07 MED ORDER — HYDROXYZINE HCL 25 MG PO TABS
25.0000 mg | ORAL_TABLET | Freq: Every evening | ORAL | Status: DC | PRN
  Administered 2014-01-07 – 2014-01-08 (×3): 25 mg via ORAL
  Filled 2014-01-07 (×2): qty 1

## 2014-01-07 MED ORDER — TUBERCULIN PPD 5 UNIT/0.1ML ID SOLN
5.0000 [IU] | Freq: Once | INTRADERMAL | Status: DC
  Administered 2014-01-07: 5 [IU] via INTRADERMAL
  Filled 2014-01-07: qty 0.1

## 2014-01-07 NOTE — ED Notes (Signed)
Patient is resting comfortably. 

## 2014-01-07 NOTE — ED Notes (Signed)
Ian MalkinZach, Child psychotherapistsocial worker, called and stated that pt has potential placement at [facility] or [facility] but that [facility] requested a TB skin test before pt could come over. Dr. Criss AlvineGoldston made aware. Zach's number [phone].

## 2014-01-07 NOTE — Progress Notes (Signed)
Inpatient Diabetes Program Recommendations  AACE/ADA: New Consensus Statement on Inpatient Glycemic Control (2013)  Target Ranges:  Prepandial:   less than 140 mg/dL      Peak postprandial:   less than 180 mg/dL (1-2 hours)      Critically ill patients:  140 - 180 mg/dL   Reason for Visit: Hyperglycemia  Diabetes history: Type 2 DM insulin requiring Outpatient Diabetes medications: Lantus 80 units QHS and Novolog 25-25-30 tidwc Current orders for Inpatient glycemic control: Novolog moderate tidwc and hs and 4 units meal coverage insulin tid  Inpatient Diabetes Program Recommendations Insulin - Basal: Add Lantus 20 units QHS and titrate until FBS<180 mg/dL Insulin - Meal Coverage: Add Novolog 6 units tidwc for meal coverage insulin if pt eats >50% meal HgbA1C: 9.3% on 10/17/2013 - uncontrolled, will need adjustments to insulin prior to discharge  Note: Will follow. Thank you. [name], RD, LDN, CDE Inpatient Diabetes Coordinator [phone]

## 2014-01-07 NOTE — Progress Notes (Signed)
   CARE MANAGEMENT ED NOTE 01/07/2014  Patient:  [name],[name]   Account Number:  [id number]  Date Initiated:  01/07/2014  Documentation initiated by:  Edd ArbourGIBBS,KIMBERLY  Subjective/Objective Assessment:   68 yr old male medicare/medicaid/Veteran's administration CM consult for medication assistance     Subjective/Objective Assessment Detail:   Pt [facility] not a [facility] candidate (he has medicare/medicaid/VA coverage) Pt from a facility and is not homeless     Action/Plan:   CM spoke with ED SW, reviewed EPIC   Action/Plan Detail:   1320 Cm spoke with ED SW, ED BH MD, Lucianne MussKumar.  Refer to ED SW notes Cm signing off   Anticipated DC Date:       Status Recommendation to Physician:   Result of Recommendation:    Other ED Services  Consult Working Plan   In-house referral  Clinical Social Worker   DC Associate Professorlanning Services  Other  Outpatient Services - Pt will follow up  Medication Assistance    Choice offered to / List presented to:            Status of service:  Completed, signed off  ED Comments:   ED Comments Detail:

## 2014-01-07 NOTE — Progress Notes (Addendum)
CSW spoke with [name], who states there is an [name] and [facility]. [name] who stated that there are beds available. CSW discussed with [name] of CSW who agreeded with plan.   CSW went and spoke with patient who is agreeable with plan for [name]. Pt asked csw to speak further with pt sister. CSW called pt sister [name] [phone].    Pt told this Clinical research associatewriter, that patient sister is guardian. Patient sister shares that she is POA however not guardian. Pt sister feels that a nurse at the facility has a personal problem with patient and has had several issues with patient diabetic care. Pt was evicted, and this was appealed. In December, a plan was made to get patient skilled nursing care if patient acted out again towards staff. Per sister, patient tried to live independently however patient is unable to manage his medications especially his insulin and food preparation. Per sister, patient resulted in numerous hospitalizations after attempting to live independently. Pt shares this same information.   Pt sister has been on the phone trying to get in touch with patient DSS social worker and left message with supervisor [name] and attempted to call her supervisor as well however she is out of the office. Pt sister spoke with [name] regarding placement who is going to advocate for patient needs. Pt sisterspoke with [facility], who stated that patient sister is following all of the steps appropriately however that alf can refuse patient at this time. Pt sister is willing to assist with pt placement however patient can not come to sisters home.    [name] HesselbachKristen Kamesha Herne, LCSW [phone]  ED CSW .01/07/2014 948am    CSW spoke further with [name] of CSW, who stated that due to this information pt not appropriate for [name] or homeless shelter as patient needs assistance with medication administration.   CSW  called patient social worker supevisor [name] regarding patient as patient current Child psychotherapistsocial worker assigned is out of the office. [phone]. CSW left message for Ms. [name] adult foster care supervisor, for APS. CSW to continue to try to seek placement at ALF for patient as discussed with [name] of CSW as patient unable to manage medications especially diabetes.   [name] HesselbachKristen Torris House, LCSW [phone]  ED CSW 01/07/2014 1246pm    CSW spoke to [name] at [facility] who stated that they just finished a meeting at DSS regarding patient and not being able to return to [facility] but are able to give some assistance iwht new placement. They suggested [facility] [place]. Per discussion they feel patient may qualify for snf for diabetic management, however pt is independent of adls except for medication management. CSW will continue to pursue placement as [name] assists with placement as well.   [name] HesselbachKristen Emerly Prak, LCSW [phone]  ED CSW .01/07/2014 1339pm

## 2014-01-07 NOTE — Consult Note (Signed)
Patient has the mental capacity to make an informed decision, reports that he's been treated poorly at his assisted living facility. The story was verified from the sister. Patient needs to return back to the facility as he is not at risk of hurting himself or others, is stable psychiatrically

## 2014-01-07 NOTE — Progress Notes (Signed)
CSW left message for [name] at [facility] regarding housing. CSW also spoke with CSW AD who is going to assist with reaching [name].   [name].[name] Dantes, LCSW [phone]  ED CSW .01/07/2014 9:06am

## 2014-01-07 NOTE — Progress Notes (Signed)
01/07/2014 A. Sabella Traore RNCM 1744pm EDCM spoke Ian MalkinZach from social work.  As per Artemio AlyZach, [facility] will be able to read the result of TB skin testing.

## 2014-01-07 NOTE — ED Notes (Signed)
Pt request medication for sleep. MD called.

## 2014-01-08 DIAGNOSIS — R079 Chest pain, unspecified: Secondary | ICD-10-CM | POA: Diagnosis not present

## 2014-01-08 LAB — GLUCOSE, CAPILLARY
Glucose-Capillary: 400 mg/dL — ABNORMAL HIGH (ref 70–99)
Glucose-Capillary: 464 mg/dL — ABNORMAL HIGH (ref 70–99)
Glucose-Capillary: 383 mg/dL — ABNORMAL HIGH (ref 70–99)
Glucose-Capillary: 316 mg/dL — ABNORMAL HIGH (ref 70–99)

## 2014-01-08 LAB — POCT I-STAT, CHEM 8
BUN: 41 mg/dL — AB (ref 6–23)
CALCIUM ION: 1.25 mmol/L (ref 1.13–1.30)
CHLORIDE: 101 meq/L (ref 96–112)
Creatinine, Ser: 1.5 mg/dL — ABNORMAL HIGH (ref 0.50–1.35)
GLUCOSE: 457 mg/dL — AB (ref 70–99)
HCT: 30 % — ABNORMAL LOW (ref 39.0–52.0)
Hemoglobin: 10.2 g/dL — ABNORMAL LOW (ref 13.0–17.0)
Potassium: 5.6 mEq/L — ABNORMAL HIGH (ref 3.7–5.3)
Sodium: 135 mEq/L — ABNORMAL LOW (ref 137–147)
TCO2: 23 mmol/L (ref 0–100)

## 2014-01-08 MED ORDER — FENOFIBRATE 160 MG PO TABS
160.0000 mg | ORAL_TABLET | Freq: Every morning | ORAL | Status: DC
  Administered 2014-01-09: 160 mg via ORAL
  Filled 2014-01-08: qty 1

## 2014-01-08 MED ORDER — HYDROCODONE-ACETAMINOPHEN 5-325 MG PO TABS
1.0000 | ORAL_TABLET | Freq: Four times a day (QID) | ORAL | Status: DC | PRN
  Administered 2014-01-08: 1 via ORAL
  Filled 2014-01-08: qty 1

## 2014-01-08 MED ORDER — INSULIN GLARGINE 100 UNIT/ML ~~LOC~~ SOLN
80.0000 [IU] | Freq: Every day | SUBCUTANEOUS | Status: DC
  Administered 2014-01-08: 80 [IU] via SUBCUTANEOUS
  Filled 2014-01-08 (×2): qty 0.8

## 2014-01-08 MED ORDER — INSULIN ASPART 100 UNIT/ML ~~LOC~~ SOLN
4.0000 [IU] | Freq: Three times a day (TID) | SUBCUTANEOUS | Status: DC
  Administered 2014-01-08: 4 [IU] via SUBCUTANEOUS
  Filled 2014-01-08: qty 1

## 2014-01-08 MED ORDER — INSULIN ASPART 100 UNIT/ML ~~LOC~~ SOLN
0.0000 [IU] | Freq: Three times a day (TID) | SUBCUTANEOUS | Status: DC
  Administered 2014-01-08: 20 [IU] via SUBCUTANEOUS
  Administered 2014-01-09: 7 [IU] via SUBCUTANEOUS
  Filled 2014-01-08: qty 1

## 2014-01-08 MED ORDER — POLYETHYLENE GLYCOL 3350 17 G PO PACK
17.0000 g | PACK | Freq: Every day | ORAL | Status: DC
  Administered 2014-01-09: 17 g via ORAL
  Filled 2014-01-08: qty 1

## 2014-01-08 MED ORDER — GUAIFENESIN 100 MG/5ML PO SYRP
30.0000 mL | ORAL_SOLUTION | Freq: Four times a day (QID) | ORAL | Status: DC | PRN
  Filled 2014-01-08: qty 30

## 2014-01-08 MED ORDER — INSULIN ASPART 100 UNIT/ML ~~LOC~~ SOLN
0.0000 [IU] | Freq: Every day | SUBCUTANEOUS | Status: DC
  Administered 2014-01-08: 4 [IU] via SUBCUTANEOUS
  Filled 2014-01-08: qty 1

## 2014-01-08 MED ORDER — INSULIN ASPART 100 UNIT/ML ~~LOC~~ SOLN: 25.0000 [IU] | Freq: Three times a day (TID) | SUBCUTANEOUS | Status: DC

## 2014-01-08 NOTE — ED Notes (Signed)
Pt personal belongings placed in locker 27.

## 2014-01-08 NOTE — ED Notes (Signed)
Patient is resting comfortably. 

## 2014-01-08 NOTE — ED Notes (Signed)
Psych MD and NP at bedside. 

## 2014-01-08 NOTE — ED Notes (Signed)
Pt bedside cbg resulted at 464; pt asymptomatic.. MD called and made aware. New orders written for pt. Will continue to monitor and reevaluate.

## 2014-01-08 NOTE — Progress Notes (Addendum)
Per chart review, patient pending potential [facility]. Pt sister also interested in patient [facility] and coming to evaluate patient around 9am. Per discussion with AD of CSW, AD of CSW discussed with family and patient that patient would need to go to first available placement. Per discussion with AD of CSW, patient and patient family in agreement with plan. CSW attempted to call [facility], however number provided was disconnected.   Dala DockMary Whitt [phone], [facility] fax number [phone]. Hours are 9-4am. CSW left message and will call back after 9am   Per chart review pt received tb skin test.  01/07/2014  19:03 Medication Given tuberculin injection 5 Units - Dose: 5 Units ; Route: Intradermal ; Site: Left Arm ; Scheduled Time: 1730 Frances MaywoodAbigail I Ledwell, RN   Olga CoasterKristen Woodward Klem, [place]CS[place] [phone]  ED CSW .01/08/2014 855am   Per AD of CSW, paitent declined from [facility]. [facility]. CSW awaiting to hear back regarding patient pasarr number. Per AD CSW, [facility] and DSS working on Armed forces technical officerpasarr number.   Byrd HesselbachKristen Joylene Wescott, LCSW [phone]  ED CSW .01/08/2014 12:20pm

## 2014-01-08 NOTE — Progress Notes (Addendum)
CSW spoke with pasarr regarding patient not having a pasarr due to being grandfathered in at the ALF. Per Pasarr, since patient is being admitted to a new facility, pt is required to have a new pasarr. CSW submitted patient pasarr screening. At this time patient is pending a level II pasarr. CSW updated patient and pt sister.   Byrd HesselbachKristen Dondi Aime, LCSW [phone]  ED CSW 01/08/2014 1420pm

## 2014-01-08 NOTE — ED Notes (Signed)
Bedside CBG 464. ISTAT chem8  Ordered for a blood glucose. Will continue to monitor, let MD and give insulin as ordered.

## 2014-01-09 DIAGNOSIS — R079 Chest pain, unspecified: Secondary | ICD-10-CM | POA: Diagnosis not present

## 2014-01-09 LAB — HEMOGLOBIN A1C
HEMOGLOBIN A1C: 9.2 % — AB (ref <5.7)
Mean Plasma Glucose: 217 mg/dL — ABNORMAL HIGH (ref <117)

## 2014-01-09 LAB — GLUCOSE, CAPILLARY
GLUCOSE-CAPILLARY: 238 mg/dL — AB (ref 70–99)
GLUCOSE-CAPILLARY: 300 mg/dL — AB (ref 70–99)

## 2014-01-09 MED ORDER — INSULIN ASPART 100 UNIT/ML ~~LOC~~ SOLN
8.0000 [IU] | Freq: Three times a day (TID) | SUBCUTANEOUS | Status: DC
  Administered 2014-01-09: 8 [IU] via SUBCUTANEOUS
  Filled 2014-01-09: qty 1

## 2014-01-09 MED ORDER — INSULIN GLARGINE 100 UNIT/ML ~~LOC~~ SOLN
90.0000 [IU] | Freq: Every day | SUBCUTANEOUS | Status: DC
  Filled 2014-01-09: qty 0.9

## 2014-01-09 NOTE — ED Notes (Signed)
Kim Child psychotherapistsocial worker called and said that hope will come to evaluate pt for placement some time today for a possible nursing home placement. Sister at bedside

## 2014-01-09 NOTE — Progress Notes (Signed)
[facility] ED CM spoke with ED RN, EDP, Lynelle DoctorKnapp, CHS SW AD, unit SW covering for ED, pt and his sister to coordinate services for [facility] [place].  Cm left a voice message for Dorinda Hillarsha, RN at Dr Florentina JennyHenry [name] office (pcp) to inquire about medication regimen after pt seen last by Dr Redmond Schoolripp Pending a return call. Cm spoke with pt & sister about his home dosages of insulin In conclusion pt found not to be on a sliding scale for home regimen He [facility] and will return to home regimen This home regimen medication [facility] with fax confirmation received at 1248.  Cm signing off

## 2014-01-09 NOTE — ED Notes (Signed)
[facility] came by to make an assessment on pt and she said that they would accept him but will need a PPD and orders for CBG checks and his diet. Left message with sister celeste [phone]

## 2014-01-09 NOTE — Progress Notes (Signed)
[facility] ED Cm spoke with CHS SW AD, Earna CoderZachary to assist with faxes to [facility] ( list of medications for home use multiple times, FL2 copy from [facility] chart, PPD notes) faxed information to Earna CoderZachary (Confirmation faxes at 1248, 1402, 1452 & 1453) Faxed information to [facility] with Fax confirmations received at 1513, 1515 & 1456 At 1510 ED CM called [facility] [phone] and spoke with Derrill Memoony, Brooke and another male staff member about medication clarification for antivert and hydrocodone prn Explained to them that the medications on the list were ordered by the pcp henry tripp but were not used by the [facility] therefore the EDP would not write prescriptions to d/c these medications. Cm informed them CM left a voice message for Dr Rexene EdisonH [name]'s RN, Dorinda Hillarsha without a return call at this time. Cm provided them with contact information for Dr Florentina JennyHenry [name]  [phone]

## 2014-01-09 NOTE — ED Provider Notes (Addendum)
Filed Vitals:   01/09/14 0548  BP: 107/45  Pulse: 60  Temp: 97.3 F (36.3 C)  Resp: 16    Last CBG was 316.  Previously was 383, 464, 400, 399.  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  Will consult medical service for recommendations.  Awaiting psych disposition.  Celene KrasJon R Shia Delaine, MD 01/09/14 0745  Pt is going to an assisted living faciliy.  Dc med rec performed including diet and ssi rec  Celene KrasJon R Rokhaya Quinn, MD 01/09/14 1217

## 2014-01-09 NOTE — Social Work (Signed)
Mr. [name] was discharged in care of his sister earlier today. Prior to being notified of his release, we had not sent his updated FL-2 information to the facility and had not reached clarity about his PASARR authorization. [facility] made with [facility] to inform them of this prior to the patient's arrival to their facility. The ALF agreed to accept the patient given the circumstances. FL-2 with updated information was provided shorty after the patient arrived. Facility offered to follow up with the PASARR process in the community.    Gretta CoolZackary Dossie Ocanas, Marine scientistLCSW Assistant Director Clinical Social Work [facility]

## 2014-01-09 NOTE — ED Notes (Signed)
PPD read and negative results. No elevation

## 2014-01-09 NOTE — ED Notes (Signed)
Patient belongings are in locker 26 in TCU. 

## 2014-01-09 NOTE — Progress Notes (Addendum)
Inpatient Diabetes Program Recommendations  AACE/ADA: New Consensus Statement on Inpatient Glycemic Control (2013)  Target Ranges:  Prepandial:   less than 140 mg/dL      Peak postprandial:   less than 180 mg/dL (1-2 hours)      Critically ill patients:  140 - 180 mg/dL   Reason for Visit: Diabetes Consult  Diabetes history: Type 1 Outpatient Diabetes medications: Lantus 80 units QHS and Novolog 25-30 tid Current orders for Inpatient glycemic control: Lantus 90 units QHS, Novolog 8 units tidwc and resistant tidwc and hs Results for [name], [name] (MRN [id number]) as of 01/09/2014 10:10  Ref. Range 01/08/2014 07:37 01/08/2014 11:46 01/08/2014 17:51 01/08/2014 21:51 01/09/2014 08:28  Glucose-Capillary Latest Range: 70-99 mg/dL 409400 (H) 811464 (H) 914383 (H) 316 (H) 238 (H)   Blood sugars trending down.  Inpatient Diabetes Program Recommendations Insulin - Basal: Decrease Lantus to 80 units QHS Insulin - Meal Coverage: Increase Novolog to 15 units tidwc for meal coverage insulin if pt eats >50% meal HgbA1C: Updated HgbA1C is pending  Note: Will continue to follow until pt is discharged from ED. Spoke with RN regarding recommendations above.  Thank you. Ailene Ardshonda Cynitha Berte, RD, LDN, CDE Inpatient Diabetes Coordinator [phone]

## 2014-01-09 NOTE — Progress Notes (Signed)
[facility] ED CM spoke with TCU RN, Shawna OrleansMelanie.  CM received a voice message from [facility] AD that pt is to be seen 01/09/14, today by [facility] [place] for possible facility placement and by staff from [facility].  CM available to answer questions via CM mobile

## 2014-01-15 ENCOUNTER — Ambulatory Visit (HOSPITAL_BASED_OUTPATIENT_CLINIC_OR_DEPARTMENT_OTHER): Payer: Medicare Other | Admitting: Oncology

## 2014-01-15 ENCOUNTER — Telehealth: Payer: Self-pay | Admitting: Oncology

## 2014-01-15 ENCOUNTER — Other Ambulatory Visit (HOSPITAL_BASED_OUTPATIENT_CLINIC_OR_DEPARTMENT_OTHER): Payer: Medicare Other

## 2014-01-15 ENCOUNTER — Encounter: Payer: Self-pay | Admitting: Oncology

## 2014-01-15 VITALS — BP 118/52 | HR 71 | Temp 98.2°F | Resp 18 | Ht 66.0 in | Wt 183.6 lb

## 2014-01-15 DIAGNOSIS — R161 Splenomegaly, not elsewhere classified: Secondary | ICD-10-CM

## 2014-01-15 DIAGNOSIS — D649 Anemia, unspecified: Secondary | ICD-10-CM

## 2014-01-15 DIAGNOSIS — D61818 Other pancytopenia: Secondary | ICD-10-CM

## 2014-01-15 DIAGNOSIS — D539 Nutritional anemia, unspecified: Secondary | ICD-10-CM

## 2014-01-15 LAB — CBC WITH DIFFERENTIAL/PLATELET
BASO%: 0.3 % (ref 0.0–2.0)
Basophils Absolute: 0 10*3/uL (ref 0.0–0.1)
EOS%: 1.7 % (ref 0.0–7.0)
Eosinophils Absolute: 0 10*3/uL (ref 0.0–0.5)
HEMATOCRIT: 31.6 % — AB (ref 38.4–49.9)
HGB: 10.3 g/dL — ABNORMAL LOW (ref 13.0–17.1)
LYMPH#: 0.5 10*3/uL — AB (ref 0.9–3.3)
LYMPH%: 19.5 % (ref 14.0–49.0)
MCH: 29.7 pg (ref 27.2–33.4)
MCHC: 32.7 g/dL (ref 32.0–36.0)
MCV: 90.6 fL (ref 79.3–98.0)
MONO#: 0.2 10*3/uL (ref 0.1–0.9)
MONO%: 7 % (ref 0.0–14.0)
NEUT#: 1.8 10*3/uL (ref 1.5–6.5)
NEUT%: 71.5 % (ref 39.0–75.0)
Platelets: 68 10*3/uL — ABNORMAL LOW (ref 140–400)
RBC: 3.49 10*6/uL — ABNORMAL LOW (ref 4.20–5.82)
RDW: 15.2 % — AB (ref 11.0–14.6)
WBC: 2.5 10*3/uL — ABNORMAL LOW (ref 4.0–10.3)

## 2014-01-15 NOTE — Telephone Encounter (Signed)
Gave pt appt for lab and Md on June 2015

## 2014-01-15 NOTE — Progress Notes (Signed)
Hematology and Oncology Follow Up Visit  [name] [id number] 1946/11/11 68 y.o. 01/15/2014 1:39 PM [name], MDTripp, [name] Mussel, MD   Principle Diagnosis: 68 year old with pancytopenia diagnosed in 01/2013. 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   Prior therapy: He is status post bone marrow biopsy done on 07/18/2013 which showed no evidence of any myelodysplasia or lymphoproliferative disorder.  Interim History:  [name] presents for a follow up visit. He is a very man I saw in 01/2012 for the above diagnosis without really any evidence of a hematological disorder. He presents today for a followup visit with his sister. Since his last visit, he has felt a lot better. He is no longer reporting symptoms of abdominal pain or distention. Is not reporting any dizziness or unsteadiness. Is not reporting any chest pain or difficulty breathing.  He is eating better with any Gi complaints. He does report some occasional hemorrhoidal bleeding but underwent a colonoscopy which was unremarkable. Has not reported any constitutional symptoms of weight loss or appetite changes. His performance status and activity level remain stable.   Medications: I have reviewed the patient's current medications.  Current Outpatient Prescriptions  Medication Sig Dispense Refill  . ALPRAZolam (XANAX) 0.25 MG tablet Take 1 tablet (0.25 mg total) by mouth at bedtime as needed for anxiety.  10 tablet  0  . atorvastatin (LIPITOR) 20 MG tablet Take 20 mg by mouth daily at 8 pm.      . buPROPion (WELLBUTRIN XL) 150 MG 24 hr tablet Take 150 mg by mouth every morning.       . carbamide peroxide (DEBROX) 6.5 % otic solution Place 5 drops into both ears once a week. On Friday @@2000       . Cholecalciferol (VITAMIN D3) 1000 UNITS CAPS Take 1,000 Units by mouth every morning.       . fenofibrate 160 MG tablet Take 160 mg by mouth every morning.       . ferrous sulfate 325 (65 FE) MG tablet Take 325 mg by mouth  daily with breakfast.      . FLUoxetine (PROZAC) 40 MG capsule Take 40 mg by mouth daily.      Marland Kitchen guaifenesin (ROBITUSSIN) 100 MG/5ML syrup Take 30 mLs by mouth every 6 (six) hours as needed for cough.      Marland Kitchen HYDROcodone-acetaminophen (NORCO/VICODIN) 5-325 MG per tablet Take 1 tablet by mouth every 6 (six) hours as needed for moderate pain.       Marland Kitchen insulin aspart (NOVOLOG) 100 UNIT/ML injection Inject 25-30 Units into the skin 3 (three) times daily with meals. 3 times a day (just before each meal) 25 units with breakfast, 25 units with lunch, and 30 units with supper      . insulin glargine (LANTUS) 100 UNIT/ML injection Inject 80 Units into the skin at bedtime.       Marland Kitchen levothyroxine (SYNTHROID, LEVOTHROID) 25 MCG tablet Take 25 mcg by mouth daily.      . Melatonin 5 MG TABS Take 10 mg by mouth daily at 8 pm.      . Multiple Vitamin (DAILY VITE) TABS Take 1 tablet by mouth every morning.       . nystatin (MYCOSTATIN/NYSTOP) 100000 UNIT/GM POWD Apply 1 Bottle topically daily. To groin      . nystatin-triamcinolone (MYCOLOG II) cream Apply 1 application topically 2 (two) times daily. Apply to back of neck and scalp      . omeprazole (PRILOSEC) 20  MG capsule Take 20 mg by mouth every morning.       . polyethylene glycol (MIRALAX / GLYCOLAX) packet Take 17 g by mouth daily.      . traZODone (DESYREL) 50 MG tablet Take 75 mg by mouth at bedtime.       . Wheat Dextrin (BENEFIBER) POWD Take 2 mLs by mouth every morning. Mix in 4 oz of water and drink      . Zinc Oxide (DESITIN CREAMY EX) Apply 1 application topically 4 (four) times daily as needed (itching/ irritation).       . [DISCONTINUED] zolpidem (AMBIEN) 5 MG tablet Take 5 mg by mouth at bedtime as needed.       No current facility-administered medications for this visit.     Allergies:  Allergies  Allergen Reactions  . Penicillins Hives    Past Medical History, Surgical history, Social history, and Family History were reviewed and  updated.  Review of SystemsConstitutional:  Negative for fever, chills, night sweats, anorexia, weight loss, pain.  Remaining ROS negative. Physical Exam: Blood pressure 118/52, pulse 71, temperature 98.2 F (36.8 C), temperature source Oral, resp. rate 18, height 5' 6"  (1.676 m), weight 183 lb 9.6 oz (83.28 kg). ECOG: 1 General appearance: alert and cooperative Head: Normocephalic, without obvious abnormality, atraumatic Neck: no adenopathy, no carotid bruit, no JVD, supple, symmetrical, trachea midline and thyroid not enlarged, symmetric, no tenderness/mass/nodules Lymph nodes: Cervical, supraclavicular, and axillary nodes normal. Heart:regular rate and rhythm, S1, S2 normal, no murmur, click, rub or gallop Lung:chest clear, no wheezing, rales, normal symmetric air entry Abdomin: soft, non-tender, without masses or organomegaly and distended EXT:no erythema, induration, or nodules   Lab Results: Lab Results  Component Value Date   WBC 2.5* 01/15/2014   HGB 10.3* 01/15/2014   HCT 31.6* 01/15/2014   MCV 90.6 01/15/2014   PLT 68* 01/15/2014     Chemistry      Component Value Date/Time   NA 135* 01/08/2014 1229   NA 137 10/24/2013 0826   K 5.6* 01/08/2014 1229   K 5.2* 10/24/2013 0826   CL 101 01/08/2014 1229   CO2 23 01/04/2014 1631   CO2 23 10/24/2013 0826   BUN 41* 01/08/2014 1229   BUN 20.1 10/24/2013 0826   CREATININE 1.50* 01/08/2014 1229   CREATININE 1.5* 10/24/2013 0826      Component Value Date/Time   CALCIUM 9.3 01/04/2014 1631   CALCIUM 9.4 10/24/2013 0826   ALKPHOS 64 01/04/2014 1631   ALKPHOS 59 10/24/2013 0826   AST 52* 01/04/2014 1631   AST 41* 10/24/2013 0826   ALT 39 01/04/2014 1631   ALT 24 10/24/2013 0826   BILITOT 0.4 01/04/2014 1631   BILITOT 0.31 10/24/2013 0826       Impression and Plan:  68 year old with the following issues:   1. Pancytopenia: His counts are main stable at this time. His bone marrow biopsy failed to show any hematological conditions.  There is no evidence of myelodysplasia or myeloproliferative disorder to explain his cytopenia. 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   2. Splenomegaly: The differential diagnosis discussed today with Mr. Windt and his sister accompanied him today. At this point I doubt that he has a hematological disorder but it's always a possibility. The CT scan on the bone marrow biopsy results do not suggest the presence of a lymphoproliferative disorder. Likely his splenomegaly and splenic  infarcts are likely related to his liver disease.  3. Anemia: We will continue to monitor his counts on a regular basis. His hemoglobin remained stable I will repeat it again in about 4 months.  Zola Button, MD 2/3/20151:39 PM

## 2014-01-17 ENCOUNTER — Ambulatory Visit: Payer: Medicare Other | Admitting: Endocrinology

## 2014-01-22 ENCOUNTER — Encounter: Payer: Self-pay | Admitting: Endocrinology

## 2014-01-22 ENCOUNTER — Ambulatory Visit (INDEPENDENT_AMBULATORY_CARE_PROVIDER_SITE_OTHER): Payer: Medicare Other | Admitting: Endocrinology

## 2014-01-22 VITALS — BP 124/60 | HR 72 | Temp 97.9°F | Ht 66.0 in | Wt 184.0 lb

## 2014-01-22 DIAGNOSIS — E1049 Type 1 diabetes mellitus with other diabetic neurological complication: Secondary | ICD-10-CM

## 2014-01-22 DIAGNOSIS — E1065 Type 1 diabetes mellitus with hyperglycemia: Principal | ICD-10-CM

## 2014-01-22 NOTE — Patient Instructions (Addendum)
Please check resident's blood sugar 4 times a day--before the 3 meals, and at bedtime.  also check if you have symptoms of your blood sugar being too high or too low.  please keep a record of the readings and bring it to your next appointment here.  please call us sooner if reident's blood sugar goes below 70, or if it stays over 200.  Please increase the novolog to 3 times a day (just before each meal) 35-35-40 units Also, reduce the lantus to 70 units at bedtime.   Please come back for a follow-up appointment in 2-4 weeks.

## 2014-01-22 NOTE — Progress Notes (Signed)
Subjective:    Patient ID: [name], male    DOB: 1946-02-25, 68 y.o.   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  He was recently seen in ER for suidical thoughts.  He was judged to not be suicidal now, and he was competent to be at a new assisted living facility [facility]([place], [place]).  He is with dtr today [name]([name]).  He brings a record of his cbg's which i have reviewed today.  It varies from 97-500, but most are in the 200's.  It is in general higher as the day goes on.  He feels better now.   Past Medical History  Diagnosis Date  . Hyperglycemia   . Hyponatremia   . DMII (diabetes mellitus, type 2)   . Depression   . IBS (irritable bowel syndrome)   . Chest pain   . Unspecified gastritis and gastroduodenitis without mention of hemorrhage   . Arthropathy, unspecified, site unspecified   . History of colon polyps   . Anemia, unspecified   . Anxiety   . GERD (gastroesophageal reflux disease)   . Hyperlipidemia   . Hypertension     Past Surgical History  Procedure Laterality Date  . Hernia repair  1964    History   Social History  . Marital Status: Divorced    Spouse Name: N/A    Number of Children: N/A  . Years of Education: N/A   Occupational History  . Not on file.   Social History Main Topics  . Smoking status: Former Smoker -- 1.00 packs/day    Types: Cigarettes  . Smokeless tobacco: Not on file  . Alcohol Use: No  . Drug Use: No  . Sexual Activity: Yes   Other Topics Concern  . Not on file   Social History Narrative  . No narrative on file    Current Outpatient Prescriptions on File Prior to Visit  Medication Sig Dispense Refill  . atorvastatin (LIPITOR) 20 MG tablet Take 20 mg by mouth daily at 8 pm.      . buPROPion (WELLBUTRIN XL) 150 MG  24 hr tablet Take 150 mg by mouth every morning.       . carbamide peroxide (DEBROX) 6.5 % otic solution Place 5 drops into both ears once a week. On Friday @@2000       . Cholecalciferol (VITAMIN D3) 1000 UNITS CAPS Take 1,000 Units by mouth every morning.       . fenofibrate 160 MG tablet Take 160 mg by mouth every morning.       . ferrous sulfate 325 (65 FE) MG tablet Take 325 mg by mouth daily with breakfast.      . FLUoxetine (PROZAC) 40 MG capsule Take 40 mg by mouth daily.      Marland Kitchen. guaifenesin (ROBITUSSIN) 100 MG/5ML syrup Take 30 mLs by mouth every 6 (six) hours as needed for cough.      Marland Kitchen. HYDROcodone-acetaminophen (NORCO/VICODIN) 5-325 MG per tablet Take 1 tablet by mouth every 6 (six) hours as needed for moderate pain.       Marland Kitchen. insulin aspart (NOVOLOG) 100 UNIT/ML injection 3 times a day (just before each meal) 35 units with breakfast, 35 units with lunch, and 40 units with supper      . insulin glargine (LANTUS) 100 UNIT/ML injection Inject  70 Units into the skin at bedtime.       Marland Kitchen levothyroxine (SYNTHROID, LEVOTHROID) 25 MCG tablet Take 25 mcg by mouth daily.      Marland Kitchen lisinopril (PRINIVIL,ZESTRIL) 5 MG tablet Take 5 mg by mouth daily.      . Melatonin 5 MG TABS Take 10 mg by mouth daily at 8 pm.      . Multiple Vitamin (DAILY VITE) TABS Take 1 tablet by mouth every morning.       Marland Kitchen omeprazole (PRILOSEC) 20 MG capsule Take 20 mg by mouth every morning.       . polyethylene glycol (MIRALAX / GLYCOLAX) packet Take 17 g by mouth daily.      . simvastatin (ZOCOR) 40 MG tablet Take 40 mg by mouth daily.      . traZODone (DESYREL) 50 MG tablet Take 75 mg by mouth at bedtime.       . Wheat Dextrin (BENEFIBER) POWD Take 2 mLs by mouth every morning. Mix in 4 oz of water and drink      . Zinc Oxide (DESITIN CREAMY EX) Apply 1 application topically 4 (four) times daily as needed (itching/ irritation).       . [DISCONTINUED] zolpidem (AMBIEN) 5 MG tablet Take 5 mg by mouth at bedtime as needed.        No current facility-administered medications on file prior to visit.    Allergies  Allergen Reactions  . Penicillins Hives    Family History  Problem Relation Age of Onset  . Stroke Mother   . Heart attack Father   . Heart disease Father   . Cancer Other     FH of Breast Cancer-other Relative  . Heart disease Other     Parent  . Hypertension Other     parent  . Cancer Maternal Uncle     colon    BP 124/60  Pulse 72  Temp(Src) 97.9 F (36.6 C) (Oral)  Ht 5\' 6"  (1.676 m)  Wt 184 lb (83.462 kg)  BMI 29.71 kg/m2  SpO2 97%  Review of Systems denies hypoglycemia.  He has lost a few lbs.     Objective:   Physical Exam VITAL SIGNS:  See vs page GENERAL: no distress   Lab Results  Component Value Date   HGBA1C 9.2* 01/09/2014      Assessment & Plan:  DM: The pattern of his cbg's indicates he needs some adjustment in his therapy.  This insulin regimen was chosen from multiple options, as it best matches his insulin to his changing requirements throughout the day.  The benefits of glycemic control must be weighed against the risks of hypoglycemia.   CAD: in this context, he should avoid hypoglycemia. Neuropathy: this limits exercise rx of DM.

## 2014-02-19 ENCOUNTER — Ambulatory Visit: Payer: Self-pay | Admitting: Endocrinology

## 2014-02-26 ENCOUNTER — Ambulatory Visit (INDEPENDENT_AMBULATORY_CARE_PROVIDER_SITE_OTHER): Payer: Medicare Other | Admitting: Endocrinology

## 2014-02-26 ENCOUNTER — Encounter: Payer: Self-pay | Admitting: Endocrinology

## 2014-02-26 VITALS — BP 116/60 | HR 71 | Temp 98.1°F | Ht 66.0 in | Wt 184.0 lb

## 2014-02-26 DIAGNOSIS — E119 Type 2 diabetes mellitus without complications: Secondary | ICD-10-CM

## 2014-02-26 NOTE — Patient Instructions (Addendum)
Please check resident's blood sugar 4 times a day--before the 3 meals, and at bedtime.  also check if you have symptoms of your blood sugar being too high or too low.  please keep a record of the readings and bring it to your next appointment here.  please call us sooner if reident's blood sugar goes below 70, or if it stays over 200.   Please increase the novolog to 3 times a day (just before each meal) 35-55-60 units.   Also, reduce the lantus to 60 units at bedtime.   I have requested for you an appointment with Dr Morrison Old[place]eth.  you will receive a phone call, about a day and time for an appointment.

## 2014-02-26 NOTE — Progress Notes (Signed)
Subjective:    Patient ID: [name], male    DOB: 1946-06-21, 68 y.o.   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  He was recently seen in ER for suidical thoughts.  He was judged to not be suicidal at that time, and he was competent to be at a new assisted living facility [facility] [place]).  He is with dtr today [name]).  she brings a record of his cbg's which i have reviewed today.  It varies from 150-600, but most are in the 200's.  It is in general higher as the day goes on.  He feels better now.  Dtr says she can no longer drive to [place].   Past Medical History  Diagnosis Date  . Hyperglycemia   . Hyponatremia   . DMII (diabetes mellitus, type 2)   . Depression   . IBS (irritable bowel syndrome)   . Chest pain   . Unspecified gastritis and gastroduodenitis without mention of hemorrhage   . Arthropathy, unspecified, site unspecified   . History of colon polyps   . Anemia, unspecified   . Anxiety   . GERD (gastroesophageal reflux disease)   . Hyperlipidemia   . Hypertension     Past Surgical History  Procedure Laterality Date  . Hernia repair  1964    History   Social History  . Marital Status: Divorced    Spouse Name: N/A    Number of Children: N/A  . Years of Education: N/A   Occupational History  . Not on file.   Social History Main Topics  . Smoking status: Former Smoker -- 1.00 packs/day    Types: Cigarettes  . Smokeless tobacco: Not on file  . Alcohol Use: No  . Drug Use: No  . Sexual Activity: Yes   Other Topics Concern  . Not on file   Social History Narrative  . No narrative on file    Current Outpatient Prescriptions on File Prior to Visit  Medication Sig Dispense Refill  . atorvastatin (LIPITOR) 20 MG tablet Take 20 mg by  mouth daily at 8 pm.      . buPROPion (WELLBUTRIN XL) 150 MG 24 hr tablet Take 150 mg by mouth every morning.       . carbamide peroxide (DEBROX) 6.5 % otic solution Place 5 drops into both ears once a week. On Friday @@2000       . Cholecalciferol (VITAMIN D3) 1000 UNITS CAPS Take 1,000 Units by mouth every morning.       . fenofibrate 160 MG tablet Take 160 mg by mouth every morning.       . ferrous sulfate 325 (65 FE) MG tablet Take 325 mg by mouth daily with breakfast.      . FLUoxetine (PROZAC) 40 MG capsule Take 40 mg by mouth daily.      Marland Kitchen guaifenesin (ROBITUSSIN) 100 MG/5ML syrup Take 30 mLs by mouth every 6 (six) hours as needed for cough.      Marland Kitchen HYDROcodone-acetaminophen (NORCO/VICODIN) 5-325 MG per tablet Take 1 tablet by mouth every 6 (six) hours as needed for moderate pain.       Marland Kitchen insulin aspart (NOVOLOG) 100 UNIT/ML injection 3 times a day (just before each meal) 35 units with breakfast, 55 units with lunch, and 60 units with supper      .  insulin glargine (LANTUS) 100 UNIT/ML injection Inject 60 Units into the skin at bedtime.       Marland Kitchen. levothyroxine (SYNTHROID, LEVOTHROID) 25 MCG tablet Take 25 mcg by mouth daily.      Marland Kitchen. lisinopril (PRINIVIL,ZESTRIL) 5 MG tablet Take 5 mg by mouth daily.      . Melatonin 5 MG TABS Take 10 mg by mouth daily at 8 pm.      . Multiple Vitamin (DAILY VITE) TABS Take 1 tablet by mouth every morning.       Marland Kitchen. omeprazole (PRILOSEC) 20 MG capsule Take 20 mg by mouth every morning.       . polyethylene glycol (MIRALAX / GLYCOLAX) packet Take 17 g by mouth daily.      . simvastatin (ZOCOR) 40 MG tablet Take 40 mg by mouth daily.      . traZODone (DESYREL) 50 MG tablet Take 75 mg by mouth at bedtime.       . Wheat Dextrin (BENEFIBER) POWD Take 2 mLs by mouth every morning. Mix in 4 oz of water and drink      . Zinc Oxide (DESITIN CREAMY EX) Apply 1 application topically 4 (four) times daily as needed (itching/ irritation).       . [DISCONTINUED] zolpidem  (AMBIEN) 5 MG tablet Take 5 mg by mouth at bedtime as needed.       No current facility-administered medications on file prior to visit.    Allergies  Allergen Reactions  . Penicillins Hives    Family History  Problem Relation Age of Onset  . Stroke Mother   . Heart attack Father   . Heart disease Father   . Cancer Other     FH of Breast Cancer-other Relative  . Heart disease Other     Parent  . Hypertension Other     parent  . Cancer Maternal Uncle     colon    BP 116/60  Pulse 71  Temp(Src) 98.1 F (36.7 C) (Oral)  Ht 5\' 6"  (1.676 m)  Wt 184 lb (83.462 kg)  BMI 29.71 kg/m2  SpO2 98%  Review of Systems He denies hypoglycemia and weight change.      Objective:   Physical Exam VITAL SIGNS:  See vs page.  GENERAL: no distress. SKIN:  Insulin injection sites at the triceps areas are normal, except for a few ecchymoses.     Lab Results  Component Value Date   HGBA1C 9.2* 01/09/2014      Assessment & Plan:  DM: he needs increased rx. This insulin regimen was chosen from multiple options, as it best matches his insulin to his changing requirements throughout the day.  The benefits of glycemic control must be weighed against the risks of hypoglycemia.   CAD: in this context, he should avoid hypoglycemia.  Neuropathy: this limits exercise rx of DM.  Depression: this also complicates the rx of DM.

## 2014-03-04 ENCOUNTER — Telehealth: Payer: Self-pay

## 2014-03-04 NOTE — Telephone Encounter (Signed)
Ok, please advise pt's dtr of this.

## 2014-03-04 NOTE — Telephone Encounter (Signed)
Requested call back.  

## 2014-03-04 NOTE — Telephone Encounter (Signed)
[facility] in [place]. Corrie DandyMary states that since the pt is a [place] access pt she cannot proceed with the referral.  Please advise, Thanks!

## 2014-03-05 NOTE — Telephone Encounter (Signed)
Pt's sister informed.

## 2014-05-29 ENCOUNTER — Telehealth: Payer: Self-pay | Admitting: Oncology

## 2014-05-29 ENCOUNTER — Encounter: Payer: Self-pay | Admitting: Oncology

## 2014-05-29 ENCOUNTER — Ambulatory Visit (HOSPITAL_BASED_OUTPATIENT_CLINIC_OR_DEPARTMENT_OTHER): Payer: Medicare Other | Admitting: Oncology

## 2014-05-29 ENCOUNTER — Other Ambulatory Visit (HOSPITAL_BASED_OUTPATIENT_CLINIC_OR_DEPARTMENT_OTHER): Payer: Medicare Other

## 2014-05-29 VITALS — BP 113/49 | HR 68 | Temp 97.4°F | Resp 18 | Ht 66.0 in | Wt 181.7 lb

## 2014-05-29 DIAGNOSIS — R161 Splenomegaly, not elsewhere classified: Secondary | ICD-10-CM

## 2014-05-29 DIAGNOSIS — D649 Anemia, unspecified: Secondary | ICD-10-CM

## 2014-05-29 DIAGNOSIS — D61818 Other pancytopenia: Secondary | ICD-10-CM

## 2014-05-29 LAB — COMPREHENSIVE METABOLIC PANEL (CC13)
ALT: 20 U/L (ref 0–55)
ANION GAP: 8 meq/L (ref 3–11)
AST: 37 U/L — ABNORMAL HIGH (ref 5–34)
Albumin: 3.3 g/dL — ABNORMAL LOW (ref 3.5–5.0)
Alkaline Phosphatase: 53 U/L (ref 40–150)
BILIRUBIN TOTAL: 0.43 mg/dL (ref 0.20–1.20)
BUN: 37.3 mg/dL — ABNORMAL HIGH (ref 7.0–26.0)
CO2: 23 meq/L (ref 22–29)
CREATININE: 1.8 mg/dL — AB (ref 0.7–1.3)
Calcium: 9.2 mg/dL (ref 8.4–10.4)
Chloride: 109 mEq/L (ref 98–109)
Glucose: 134 mg/dl (ref 70–140)
Potassium: 5 mEq/L (ref 3.5–5.1)
Sodium: 140 mEq/L (ref 136–145)
Total Protein: 8.2 g/dL (ref 6.4–8.3)

## 2014-05-29 LAB — CBC WITH DIFFERENTIAL/PLATELET
BASO%: 0.6 % (ref 0.0–2.0)
Basophils Absolute: 0 10*3/uL (ref 0.0–0.1)
EOS%: 2.8 % (ref 0.0–7.0)
Eosinophils Absolute: 0.1 10*3/uL (ref 0.0–0.5)
HEMATOCRIT: 28.9 % — AB (ref 38.4–49.9)
HGB: 9.3 g/dL — ABNORMAL LOW (ref 13.0–17.1)
LYMPH%: 30.5 % (ref 14.0–49.0)
MCH: 30.5 pg (ref 27.2–33.4)
MCHC: 32.2 g/dL (ref 32.0–36.0)
MCV: 94.8 fL (ref 79.3–98.0)
MONO#: 0.2 10*3/uL (ref 0.1–0.9)
MONO%: 10.2 % (ref 0.0–14.0)
NEUT#: 1 10*3/uL — ABNORMAL LOW (ref 1.5–6.5)
NEUT%: 55.9 % (ref 39.0–75.0)
PLATELETS: 65 10*3/uL — AB (ref 140–400)
RBC: 3.05 10*6/uL — ABNORMAL LOW (ref 4.20–5.82)
RDW: 15.7 % — ABNORMAL HIGH (ref 11.0–14.6)
WBC: 1.8 10*3/uL — ABNORMAL LOW (ref 4.0–10.3)
lymph#: 0.5 10*3/uL — ABNORMAL LOW (ref 0.9–3.3)

## 2014-05-29 NOTE — Progress Notes (Signed)
Hematology and Oncology Follow Up Visit  [name] [id number] 1946-11-25 68 y.o. 05/29/2014 9:03 AM [name], MDTripp, [name] Mussel, MD   Principle Diagnosis: 68 year old with pancytopenia diagnosed in 01/2013. 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 Hematological disorder a possibility but his workup has been unrevealing.  Prior therapy: He is status post bone marrow biopsy done on 07/18/2013 which showed no evidence of any myelodysplasia or lymphoproliferative disorder.  Interim History:  [name] presents for a follow up visit with his sister. Since his last visit, he he has no new complaints. He is no longer reporting symptoms of abdominal pain or distention. Is not reporting any dizziness or unsteadiness. Is not reporting any chest pain or difficulty breathing.  He is eating better with any GI complaints. He does report some occasional hemorrhoidal bleeding but underwent a colonoscopy which was unremarkable. He does not report any hemoptysis or hematemesis. Does not report any epistaxis. Does not report any recurrent infections. Has not reported any constitutional symptoms of weight loss or appetite changes. His performance status and activity level remain stable. He does not report any fevers or chills or sweats. As that report any abdominal pain or early satiety. Did not report any hematuria or dysuria. Does not report any muscle or joint pain. Does not report any lymphadenopathy or petechiae. Mr. his review of system is unremarkable  Medications: I have reviewed the patient's current medications.  Current Outpatient Prescriptions  Medication Sig Dispense Refill  . atorvastatin (LIPITOR) 20 MG tablet Take 20 mg by mouth daily at 8 pm.      . buPROPion (WELLBUTRIN XL) 150 MG 24 hr tablet Take 150 mg by mouth every morning.       . Cholecalciferol (VITAMIN D3) 1000 UNITS CAPS Take 1,000 Units by mouth every morning.       . fenofibrate 160 MG tablet Take 160 mg by mouth  every morning.       . ferrous sulfate 325 (65 FE) MG tablet Take 325 mg by mouth daily with breakfast.      . FLUoxetine (PROZAC) 40 MG capsule Take 40 mg by mouth daily.      . insulin aspart (NOVOLOG) 100 UNIT/ML injection 3 times a day (just before each meal) 35 units with breakfast, 55 units with lunch, and 60 units with supper      . insulin glargine (LANTUS) 100 UNIT/ML injection Inject 60 Units into the skin at bedtime.       Marland Kitchen levothyroxine (SYNTHROID, LEVOTHROID) 25 MCG tablet Take 25 mcg by mouth daily.      Marland Kitchen lisinopril (PRINIVIL,ZESTRIL) 5 MG tablet Take 5 mg by mouth daily.      . Melatonin 5 MG TABS Take 10 mg by mouth daily at 8 pm.      . MONOJECT INS SYR 1CC/29G 29G X 1/2" 1 ML MISC       . Multiple Vitamin (DAILY VITE) TABS Take 1 tablet by mouth every morning.       Marland Kitchen omeprazole (PRILOSEC) 20 MG capsule Take 20 mg by mouth every morning.       . polyethylene glycol (MIRALAX / GLYCOLAX) packet Take 17 g by mouth daily.      . simvastatin (ZOCOR) 40 MG tablet Take 40 mg by mouth daily.      . tamsulosin (FLOMAX) 0.4 MG CAPS capsule Take 0.4 mg by mouth.      . traZODone (DESYREL) 50 MG tablet Take 75 mg  by mouth at bedtime.       . Wheat Dextrin (BENEFIBER) POWD Take 2 mLs by mouth every morning. Mix in 4 oz of water and drink      . Zinc Oxide (DESITIN CREAMY EX) Apply 1 application topically 4 (four) times daily as needed (itching/ irritation).       . carbamide peroxide (DEBROX) 6.5 % otic solution Place 5 drops into both ears once a week. On Friday @_0       . HYDROcodone-acetaminophen (NORCO/VICODIN) 5-325 MG per tablet Take 1 tablet by mouth every 6 (six) hours as needed for moderate pain.       . [DISCONTINUED] zolpidem (AMBIEN) 5 MG tablet Take 5 mg by mouth at bedtime as needed.       No current facility-administered medications for this visit.     Allergies:  Allergies  Allergen Reactions  . Penicillins Hives    Past Medical History, Surgical history,  Social history, and Family History were reviewed and updated.   Physical Exam: Blood pressure 113/49, pulse 68, temperature 97.4 F (36.3 C), temperature source Oral, resp. rate 18, height 5' 6" (1.676 m), weight 181 lb 11.2 oz (82.419 kg). ECOG: 1 General appearance: alert Head: Normocephalic, without obvious abnormality Neck: no adenopathy Lymph nodes: Cervical, supraclavicular, and axillary nodes normal. Heart:regular rate and rhythm, S1, S2. Lung:chest clear, no wheezing, rales, normal symmetric air entry Abdomin: soft, non-tender, without masses or organomegaly. Cannot appreciate any hepatosplenomegaly. He does have good bowel sounds. EXT:no erythema, induration, or nodules Skin: No rashes or lesions.  Lab Results: Lab Results  Component Value Date   WBC 1.8* 05/29/2014   HGB 9.3* 05/29/2014   HCT 28.9* 05/29/2014   MCV 94.8 05/29/2014   PLT 65* 05/29/2014     Chemistry      Component Value Date/Time   NA 135* 01/08/2014 1229   NA 137 10/24/2013 0826   K 5.6* 01/08/2014 1229   K 5.2* 10/24/2013 0826   CL 101 01/08/2014 1229   CO2 23 01/04/2014 1631   CO2 23 10/24/2013 0826   BUN 41* 01/08/2014 1229   BUN 20.1 10/24/2013 0826   CREATININE 1.50* 01/08/2014 1229   CREATININE 1.5* 10/24/2013 0826      Component Value Date/Time   CALCIUM 9.3 01/04/2014 1631   CALCIUM 9.4 10/24/2013 0826   ALKPHOS 64 01/04/2014 1631   ALKPHOS 59 10/24/2013 0826   AST 52* 01/04/2014 1631   AST 41* 10/24/2013 0826   ALT 39 01/04/2014 1631   ALT 24 10/24/2013 0826   BILITOT 0.4 01/04/2014 1631   BILITOT 0.31 10/24/2013 0826       Impression and Plan:  68 year old with the following issues:   1. Pancytopenia: His counts are main stable at this time with a slight decline. His bone marrow biopsy failed to show any hematological conditions and honest of 2014. There is no evidence of myelodysplasia or myeloproliferative disorder to explain his cytopenia. 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   2. Splenomegaly: The CT scan on the bone marrow biopsy results do not suggest the presence of a lymphoproliferative disorder. 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 No evidence of progression at this time.  3. Anemia: We will continue to monitor his counts on a regular basis. His hemoglobin remained stable I will repeat it again in about 4 months. We  can certainly consider growth factor support in the future if needed to.  [facility], MD 6/17/20159:03 AM

## 2014-05-29 NOTE — Telephone Encounter (Signed)
gave pt appt for october 2015 lab and MD

## 2014-07-05 ENCOUNTER — Telehealth: Payer: Self-pay

## 2014-07-05 NOTE — Telephone Encounter (Signed)
Called patient LMOVM to call back schedule appointment with Dr. Everardo AllEllison (diabetic bundle).

## 2014-09-26 ENCOUNTER — Ambulatory Visit (HOSPITAL_BASED_OUTPATIENT_CLINIC_OR_DEPARTMENT_OTHER): Payer: Medicare Other | Admitting: Oncology

## 2014-09-26 ENCOUNTER — Other Ambulatory Visit (HOSPITAL_BASED_OUTPATIENT_CLINIC_OR_DEPARTMENT_OTHER): Payer: Medicare Other

## 2014-09-26 ENCOUNTER — Telehealth: Payer: Self-pay | Admitting: Oncology

## 2014-09-26 VITALS — BP 118/41 | HR 73 | Temp 97.7°F | Resp 18 | Ht 66.0 in | Wt 186.1 lb

## 2014-09-26 DIAGNOSIS — D61818 Other pancytopenia: Secondary | ICD-10-CM

## 2014-09-26 DIAGNOSIS — R161 Splenomegaly, not elsewhere classified: Secondary | ICD-10-CM

## 2014-09-26 DIAGNOSIS — D539 Nutritional anemia, unspecified: Secondary | ICD-10-CM

## 2014-09-26 DIAGNOSIS — D649 Anemia, unspecified: Secondary | ICD-10-CM

## 2014-09-26 LAB — CBC WITH DIFFERENTIAL/PLATELET
BASO%: 0.6 % (ref 0.0–2.0)
BASOS ABS: 0 10*3/uL (ref 0.0–0.1)
EOS ABS: 0 10*3/uL (ref 0.0–0.5)
EOS%: 2.2 % (ref 0.0–7.0)
HEMATOCRIT: 28.5 % — AB (ref 38.4–49.9)
HGB: 9 g/dL — ABNORMAL LOW (ref 13.0–17.1)
LYMPH#: 0.4 10*3/uL — AB (ref 0.9–3.3)
LYMPH%: 22 % (ref 14.0–49.0)
MCH: 30.6 pg (ref 27.2–33.4)
MCHC: 31.6 g/dL — ABNORMAL LOW (ref 32.0–36.0)
MCV: 96.8 fL (ref 79.3–98.0)
MONO#: 0.2 10*3/uL (ref 0.1–0.9)
MONO%: 10.2 % (ref 0.0–14.0)
NEUT%: 65 % (ref 39.0–75.0)
NEUTROS ABS: 1.3 10*3/uL — AB (ref 1.5–6.5)
Platelets: 86 10*3/uL — ABNORMAL LOW (ref 140–400)
RBC: 2.95 10*6/uL — ABNORMAL LOW (ref 4.20–5.82)
RDW: 15.3 % — ABNORMAL HIGH (ref 11.0–14.6)
WBC: 2 10*3/uL — ABNORMAL LOW (ref 4.0–10.3)

## 2014-09-26 LAB — COMPREHENSIVE METABOLIC PANEL (CC13)
ALBUMIN: 3.3 g/dL — AB (ref 3.5–5.0)
ALT: 17 U/L (ref 0–55)
AST: 30 U/L (ref 5–34)
Alkaline Phosphatase: 42 U/L (ref 40–150)
Anion Gap: 6 mEq/L (ref 3–11)
BUN: 29.5 mg/dL — AB (ref 7.0–26.0)
CALCIUM: 9.6 mg/dL (ref 8.4–10.4)
CHLORIDE: 112 meq/L — AB (ref 98–109)
CO2: 23 meq/L (ref 22–29)
Creatinine: 1.6 mg/dL — ABNORMAL HIGH (ref 0.7–1.3)
GLUCOSE: 106 mg/dL (ref 70–140)
POTASSIUM: 4.8 meq/L (ref 3.5–5.1)
Sodium: 141 mEq/L (ref 136–145)
Total Bilirubin: 0.49 mg/dL (ref 0.20–1.20)
Total Protein: 7.7 g/dL (ref 6.4–8.3)

## 2014-09-26 NOTE — Progress Notes (Signed)
Hematology and Oncology Follow Up Visit  [name] [id number] [birth year] 68 y.o. 09/26/2014 12:01 PM [name], MDTripp, [name] Mussel, MD   Principle Diagnosis: 68 year old with pancytopenia diagnosed in 01/2013. 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 Hematological disorder a possibility but his workup has been unrevealing.  Prior therapy: He is status post bone marrow biopsy done on 07/18/2013 which showed no evidence of any myelodysplasia or lymphoproliferative disorder.  Interim History:  [name] presents for a follow up visit with his sister. Since his last visit, he has no new complaints. He is no longer reporting symptoms of abdominal pain or distention. Is not reporting any dizziness or unsteadiness. He did have a flu vaccination last week and had minor flu like symptoms that resolved today. Is not reporting any chest pain or difficulty breathing.  He is eating better with any GI complaints. He does report some occasional hemorrhoidal bleeding but underwent a colonoscopy which was unremarkable. He does not report any hemoptysis or hematemesis. Does not report any epistaxis. Does not report any recurrent infections. Has not reported any constitutional symptoms of weight loss or appetite changes. His performance status and activity level remain stable. He does not report any fevers or chills or sweats. As that report any abdominal pain or early satiety. Did not report any hematuria or dysuria. Does not report any muscle or joint pain. Does not report any lymphadenopathy or petechiae. Mr. his review of system is unremarkable  Medications: I have reviewed the patient's current medications.  Current Outpatient Prescriptions  Medication Sig Dispense Refill  . atorvastatin (LIPITOR) 20 MG tablet Take 20 mg by mouth daily at 8 pm.      . buPROPion (WELLBUTRIN XL) 150 MG 24 hr tablet Take 150 mg by mouth every morning.       . carbamide peroxide (DEBROX) 6.5 % otic solution  Place 5 drops into both ears once a week. On Friday @@2000       . Cholecalciferol (VITAMIN D3) 1000 UNITS CAPS Take 1,000 Units by mouth every morning.       . fenofibrate 160 MG tablet Take 160 mg by mouth every morning.       . ferrous sulfate 325 (65 FE) MG tablet Take 325 mg by mouth daily with breakfast.      . FLUoxetine (PROZAC) 40 MG capsule Take 40 mg by mouth daily.      Marland Kitchen guaifenesin (ROBITUSSIN) 100 MG/5ML syrup Take by mouth.      Marland Kitchen HYDROcodone-acetaminophen (NORCO/VICODIN) 5-325 MG per tablet Take 1 tablet by mouth every 6 (six) hours as needed for moderate pain.       Marland Kitchen insulin aspart (NOVOLOG) 100 UNIT/ML injection 3 times a day (just before each meal) 35 units with breakfast, 55 units with lunch, and 60 units with supper      . insulin glargine (LANTUS) 100 UNIT/ML injection Inject 60 Units into the skin at bedtime.       Marland Kitchen levothyroxine (SYNTHROID, LEVOTHROID) 25 MCG tablet Take 25 mcg by mouth daily.      Marland Kitchen lisinopril (PRINIVIL,ZESTRIL) 5 MG tablet Take 5 mg by mouth daily.      . meclizine (ANTIVERT) 25 MG tablet Take 25 mg by mouth.      . Melatonin 5 MG TABS Take 10 mg by mouth daily at 8 pm.      . MONOJECT INS SYR 1CC/29G 29G X 1/2" 1 ML MISC       .  Multiple Vitamin (DAILY VITE) TABS Take 1 tablet by mouth every morning.       . nystatin cream (MYCOSTATIN)       . omeprazole (PRILOSEC) 20 MG capsule Take 20 mg by mouth every morning.       . polyethylene glycol (MIRALAX / GLYCOLAX) packet Take 17 g by mouth daily.      . simvastatin (ZOCOR) 40 MG tablet Take 40 mg by mouth daily.      . tamsulosin (FLOMAX) 0.4 MG CAPS capsule Take 0.4 mg by mouth.      . traZODone (DESYREL) 50 MG tablet Take 75 mg by mouth at bedtime.       . Wheat Dextrin (BENEFIBER) POWD Take 2 mLs by mouth every morning. Mix in 4 oz of water and drink      . Zinc Oxide (DESITIN CREAMY EX) Apply 1 application topically 4 (four) times daily as needed (itching/ irritation).       . [DISCONTINUED]  zolpidem (AMBIEN) 5 MG tablet Take 5 mg by mouth at bedtime as needed.       No current facility-administered medications for this visit.     Allergies:  Allergies  Allergen Reactions  . Penicillins Hives    Past Medical History, Surgical history, Social history, and Family History were reviewed and updated.   Physical Exam: Blood pressure 118/41, pulse 73, temperature 97.7 F (36.5 C), temperature source Oral, resp. rate 18, height 5' 6"  (1.676 m), weight 186 lb 1.6 oz (84.414 kg). ECOG: 1 General appearance: alert Head: Normocephalic, without obvious abnormality Neck: no adenopathy Lymph nodes: Cervical, supraclavicular, and axillary nodes normal. Heart:regular rate and rhythm, S1, S2. Lung:chest clear, no wheezing, rales, normal symmetric air entry Abdomin: soft, non-tender, without masses or organomegaly. Slightly distended but cannot appreciate any hepatosplenomegaly. He does have good bowel sounds. EXT:no erythema, induration, or nodules Skin: No rashes or lesions.  Lab Results: Lab Results  Component Value Date   WBC 2.0* 09/26/2014   HGB 9.0* 09/26/2014   HCT 28.5* 09/26/2014   MCV 96.8 09/26/2014   PLT 86* 09/26/2014     Chemistry      Component Value Date/Time   NA 141 09/26/2014 1127   NA 135* 01/08/2014 1229   K 4.8 09/26/2014 1127   K 5.6* 01/08/2014 1229   CL 101 01/08/2014 1229   CO2 23 09/26/2014 1127   CO2 23 01/04/2014 1631   BUN 29.5* 09/26/2014 1127   BUN 41* 01/08/2014 1229   CREATININE 1.6* 09/26/2014 1127   CREATININE 1.50* 01/08/2014 1229      Component Value Date/Time   CALCIUM 9.6 09/26/2014 1127   CALCIUM 9.3 01/04/2014 1631   ALKPHOS 42 09/26/2014 1127   ALKPHOS 64 01/04/2014 1631   AST 30 09/26/2014 1127   AST 52* 01/04/2014 1631   ALT 17 09/26/2014 1127   ALT 39 01/04/2014 1631   BILITOT 0.49 09/26/2014 1127   BILITOT 0.4 01/04/2014 1631       Impression and Plan:  68 year old with the following issues:   1. Pancytopenia: His  counts are main stable at this time. His bone marrow biopsy failed to show any hematological conditions and honest of 2014. There is no evidence of myelodysplasia or myeloproliferative disorder to explain his cytopenia. 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   2. Splenomegaly: The CT scan on the bone marrow biopsy results do not  suggest the presence of a lymphoproliferative disorder. 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 No evidence of progression at this time.  3. Anemia: His hemoglobin is 9 today and certainly has not required transfusions. We will continue to monitor his counts on a regular basis. I will repeat it again in about 4 months. We can certainly consider growth factor support in the future if needed to.  [facility], MD 10/15/201512:01 PM

## 2014-09-26 NOTE — Telephone Encounter (Signed)
Pt confirmed labs/ov per 10/15 POF, gave pt AVS..... KJ °

## 2015-01-06 ENCOUNTER — Inpatient Hospital Stay (HOSPITAL_COMMUNITY)
Admission: EM | Admit: 2015-01-06 | Discharge: 2015-01-10 | DRG: 481 | Disposition: A | Discharge status: Skilled Nursing Facility | Payer: Medicare Other | Attending: Internal Medicine | Admitting: Internal Medicine

## 2015-01-06 ENCOUNTER — Emergency Department (HOSPITAL_COMMUNITY): Payer: Medicare Other

## 2015-01-06 ENCOUNTER — Encounter (HOSPITAL_COMMUNITY): Payer: Self-pay | Admitting: Emergency Medicine

## 2015-01-06 DIAGNOSIS — E039 Hypothyroidism, unspecified: Secondary | ICD-10-CM | POA: Diagnosis present

## 2015-01-06 DIAGNOSIS — K219 Gastro-esophageal reflux disease without esophagitis: Secondary | ICD-10-CM | POA: Diagnosis present

## 2015-01-06 DIAGNOSIS — R339 Retention of urine, unspecified: Secondary | ICD-10-CM | POA: Diagnosis not present

## 2015-01-06 DIAGNOSIS — S72002A Fracture of unspecified part of neck of left femur, initial encounter for closed fracture: Secondary | ICD-10-CM

## 2015-01-06 DIAGNOSIS — Y92199 Unspecified place in other specified residential institution as the place of occurrence of the external cause: Secondary | ICD-10-CM

## 2015-01-06 DIAGNOSIS — D539 Nutritional anemia, unspecified: Secondary | ICD-10-CM

## 2015-01-06 DIAGNOSIS — K7030 Alcoholic cirrhosis of liver without ascites: Secondary | ICD-10-CM | POA: Diagnosis present

## 2015-01-06 DIAGNOSIS — S72009A Fracture of unspecified part of neck of unspecified femur, initial encounter for closed fracture: Secondary | ICD-10-CM

## 2015-01-06 DIAGNOSIS — F418 Other specified anxiety disorders: Secondary | ICD-10-CM | POA: Diagnosis present

## 2015-01-06 DIAGNOSIS — D62 Acute posthemorrhagic anemia: Secondary | ICD-10-CM | POA: Diagnosis not present

## 2015-01-06 DIAGNOSIS — F329 Major depressive disorder, single episode, unspecified: Secondary | ICD-10-CM | POA: Diagnosis present

## 2015-01-06 DIAGNOSIS — F419 Anxiety disorder, unspecified: Secondary | ICD-10-CM | POA: Diagnosis present

## 2015-01-06 DIAGNOSIS — E875 Hyperkalemia: Secondary | ICD-10-CM | POA: Diagnosis present

## 2015-01-06 DIAGNOSIS — R161 Splenomegaly, not elsewhere classified: Secondary | ICD-10-CM | POA: Diagnosis present

## 2015-01-06 DIAGNOSIS — Z87891 Personal history of nicotine dependence: Secondary | ICD-10-CM

## 2015-01-06 DIAGNOSIS — R197 Diarrhea, unspecified: Secondary | ICD-10-CM | POA: Diagnosis present

## 2015-01-06 DIAGNOSIS — F32A Depression, unspecified: Secondary | ICD-10-CM | POA: Diagnosis present

## 2015-01-06 DIAGNOSIS — R109 Unspecified abdominal pain: Secondary | ICD-10-CM

## 2015-01-06 DIAGNOSIS — J811 Chronic pulmonary edema: Secondary | ICD-10-CM | POA: Diagnosis present

## 2015-01-06 DIAGNOSIS — E785 Hyperlipidemia, unspecified: Secondary | ICD-10-CM | POA: Diagnosis present

## 2015-01-06 DIAGNOSIS — I129 Hypertensive chronic kidney disease with stage 1 through stage 4 chronic kidney disease, or unspecified chronic kidney disease: Secondary | ICD-10-CM | POA: Diagnosis present

## 2015-01-06 DIAGNOSIS — M25559 Pain in unspecified hip: Secondary | ICD-10-CM

## 2015-01-06 DIAGNOSIS — I10 Essential (primary) hypertension: Secondary | ICD-10-CM | POA: Diagnosis present

## 2015-01-06 DIAGNOSIS — D500 Iron deficiency anemia secondary to blood loss (chronic): Secondary | ICD-10-CM | POA: Diagnosis present

## 2015-01-06 DIAGNOSIS — S72142A Displaced intertrochanteric fracture of left femur, initial encounter for closed fracture: Principal | ICD-10-CM | POA: Diagnosis present

## 2015-01-06 DIAGNOSIS — E119 Type 2 diabetes mellitus without complications: Secondary | ICD-10-CM | POA: Diagnosis present

## 2015-01-06 DIAGNOSIS — E118 Type 2 diabetes mellitus with unspecified complications: Secondary | ICD-10-CM

## 2015-01-06 DIAGNOSIS — Z794 Long term (current) use of insulin: Secondary | ICD-10-CM

## 2015-01-06 DIAGNOSIS — W0110XA Fall on same level from slipping, tripping and stumbling with subsequent striking against unspecified object, initial encounter: Secondary | ICD-10-CM | POA: Diagnosis present

## 2015-01-06 DIAGNOSIS — N183 Chronic kidney disease, stage 3 (moderate): Secondary | ICD-10-CM | POA: Diagnosis present

## 2015-01-06 DIAGNOSIS — R112 Nausea with vomiting, unspecified: Secondary | ICD-10-CM | POA: Diagnosis present

## 2015-01-06 DIAGNOSIS — D61818 Other pancytopenia: Secondary | ICD-10-CM | POA: Diagnosis present

## 2015-01-06 DIAGNOSIS — M25552 Pain in left hip: Secondary | ICD-10-CM | POA: Diagnosis not present

## 2015-01-06 MED ORDER — SODIUM CHLORIDE 0.9 % IV SOLN
1000.0000 mL | Freq: Once | INTRAVENOUS | Status: AC
  Administered 2015-01-07: 1000 mL via INTRAVENOUS

## 2015-01-06 MED ORDER — SODIUM CHLORIDE 0.9 % IV SOLN
1000.0000 mL | INTRAVENOUS | Status: DC
  Administered 2015-01-07 – 2015-01-09 (×5): 1000 mL via INTRAVENOUS

## 2015-01-06 MED ORDER — HYDROMORPHONE HCL 1 MG/ML IJ SOLN
1.0000 mg | Freq: Once | INTRAMUSCULAR | Status: AC
  Administered 2015-01-07: 1 mg via INTRAVENOUS
  Filled 2015-01-06: qty 1

## 2015-01-06 NOTE — ED Notes (Signed)
X-ray at bedside

## 2015-01-06 NOTE — ED Notes (Signed)
[facility] [place] with c/o hip pain after his fall tonight.  Pt reported that he fell on a hardwood floor on his left side and has had immediate pain to left hip after his fall.  Pt presents to ED A/Ox4, c/o 10/10 pain to left hip----- external rotation and shortening to left leg observed.

## 2015-01-07 ENCOUNTER — Emergency Department (HOSPITAL_COMMUNITY): Payer: Medicare Other

## 2015-01-07 ENCOUNTER — Encounter (HOSPITAL_COMMUNITY)
Admission: EM | Disposition: A | Discharge status: Skilled Nursing Facility | Payer: Self-pay | Source: Home / Self Care | Attending: Internal Medicine

## 2015-01-07 ENCOUNTER — Inpatient Hospital Stay (HOSPITAL_COMMUNITY): Payer: Medicare Other | Admitting: Anesthesiology

## 2015-01-07 ENCOUNTER — Inpatient Hospital Stay (HOSPITAL_COMMUNITY): Payer: Medicare Other

## 2015-01-07 ENCOUNTER — Encounter (HOSPITAL_COMMUNITY): Payer: Self-pay | Admitting: Anesthesiology

## 2015-01-07 DIAGNOSIS — R112 Nausea with vomiting, unspecified: Secondary | ICD-10-CM | POA: Diagnosis present

## 2015-01-07 DIAGNOSIS — D500 Iron deficiency anemia secondary to blood loss (chronic): Secondary | ICD-10-CM | POA: Diagnosis present

## 2015-01-07 DIAGNOSIS — Z794 Long term (current) use of insulin: Secondary | ICD-10-CM | POA: Diagnosis not present

## 2015-01-07 DIAGNOSIS — M25552 Pain in left hip: Secondary | ICD-10-CM | POA: Diagnosis present

## 2015-01-07 DIAGNOSIS — R161 Splenomegaly, not elsewhere classified: Secondary | ICD-10-CM | POA: Diagnosis present

## 2015-01-07 DIAGNOSIS — E785 Hyperlipidemia, unspecified: Secondary | ICD-10-CM | POA: Diagnosis present

## 2015-01-07 DIAGNOSIS — K210 Gastro-esophageal reflux disease with esophagitis: Secondary | ICD-10-CM

## 2015-01-07 DIAGNOSIS — I129 Hypertensive chronic kidney disease with stage 1 through stage 4 chronic kidney disease, or unspecified chronic kidney disease: Secondary | ICD-10-CM | POA: Diagnosis present

## 2015-01-07 DIAGNOSIS — F329 Major depressive disorder, single episode, unspecified: Secondary | ICD-10-CM

## 2015-01-07 DIAGNOSIS — J811 Chronic pulmonary edema: Secondary | ICD-10-CM | POA: Diagnosis present

## 2015-01-07 DIAGNOSIS — E875 Hyperkalemia: Secondary | ICD-10-CM | POA: Diagnosis present

## 2015-01-07 DIAGNOSIS — S72142A Displaced intertrochanteric fracture of left femur, initial encounter for closed fracture: Secondary | ICD-10-CM | POA: Diagnosis present

## 2015-01-07 DIAGNOSIS — N183 Chronic kidney disease, stage 3 (moderate): Secondary | ICD-10-CM | POA: Diagnosis present

## 2015-01-07 DIAGNOSIS — K7030 Alcoholic cirrhosis of liver without ascites: Secondary | ICD-10-CM | POA: Diagnosis present

## 2015-01-07 DIAGNOSIS — E039 Hypothyroidism, unspecified: Secondary | ICD-10-CM | POA: Diagnosis present

## 2015-01-07 DIAGNOSIS — E119 Type 2 diabetes mellitus without complications: Secondary | ICD-10-CM | POA: Diagnosis present

## 2015-01-07 DIAGNOSIS — D62 Acute posthemorrhagic anemia: Secondary | ICD-10-CM | POA: Diagnosis not present

## 2015-01-07 DIAGNOSIS — Y92199 Unspecified place in other specified residential institution as the place of occurrence of the external cause: Secondary | ICD-10-CM | POA: Diagnosis not present

## 2015-01-07 DIAGNOSIS — W0110XA Fall on same level from slipping, tripping and stumbling with subsequent striking against unspecified object, initial encounter: Secondary | ICD-10-CM | POA: Diagnosis present

## 2015-01-07 DIAGNOSIS — I10 Essential (primary) hypertension: Secondary | ICD-10-CM

## 2015-01-07 DIAGNOSIS — Z87891 Personal history of nicotine dependence: Secondary | ICD-10-CM | POA: Diagnosis not present

## 2015-01-07 DIAGNOSIS — D61818 Other pancytopenia: Secondary | ICD-10-CM

## 2015-01-07 DIAGNOSIS — R197 Diarrhea, unspecified: Secondary | ICD-10-CM

## 2015-01-07 DIAGNOSIS — S72009A Fracture of unspecified part of neck of unspecified femur, initial encounter for closed fracture: Secondary | ICD-10-CM | POA: Insufficient documentation

## 2015-01-07 DIAGNOSIS — K219 Gastro-esophageal reflux disease without esophagitis: Secondary | ICD-10-CM | POA: Diagnosis present

## 2015-01-07 DIAGNOSIS — F418 Other specified anxiety disorders: Secondary | ICD-10-CM | POA: Diagnosis present

## 2015-01-07 DIAGNOSIS — S72002A Fracture of unspecified part of neck of left femur, initial encounter for closed fracture: Secondary | ICD-10-CM | POA: Diagnosis present

## 2015-01-07 DIAGNOSIS — R339 Retention of urine, unspecified: Secondary | ICD-10-CM | POA: Diagnosis not present

## 2015-01-07 HISTORY — PX: INTRAMEDULLARY (IM) NAIL INTERTROCHANTERIC: SHX5875

## 2015-01-07 LAB — BASIC METABOLIC PANEL
ANION GAP: 6 (ref 5–15)
ANION GAP: 5 (ref 5–15)
Anion gap: 8 (ref 5–15)
BUN: 23 mg/dL (ref 6–23)
BUN: 27 mg/dL — AB (ref 6–23)
BUN: 30 mg/dL — ABNORMAL HIGH (ref 6–23)
CO2: 24 mmol/L (ref 19–32)
CO2: 23 mmol/L (ref 19–32)
CO2: 23 mmol/L (ref 19–32)
CREATININE: 1.46 mg/dL — AB (ref 0.50–1.35)
CREATININE: 1.49 mg/dL — AB (ref 0.50–1.35)
Calcium: 9 mg/dL (ref 8.4–10.5)
Calcium: 8.2 mg/dL — ABNORMAL LOW (ref 8.4–10.5)
Calcium: 8.2 mg/dL — ABNORMAL LOW (ref 8.4–10.5)
Chloride: 109 mmol/L (ref 96–112)
Chloride: 108 mmol/L (ref 96–112)
Chloride: 109 mmol/L (ref 96–112)
Creatinine, Ser: 1.4 mg/dL — ABNORMAL HIGH (ref 0.50–1.35)
GFR calc Af Amer: 56 mL/min — ABNORMAL LOW (ref >90)
GFR calc Af Amer: 54 mL/min — ABNORMAL LOW (ref >90)
GFR calc non Af Amer: 50 mL/min — ABNORMAL LOW (ref >90)
GFR calc non Af Amer: 47 mL/min — ABNORMAL LOW (ref >90)
GFR, EST AFRICAN AMERICAN: 59 mL/min — AB (ref >90)
GFR, EST NON AFRICAN AMERICAN: 48 mL/min — AB (ref >90)
Glucose, Bld: 159 mg/dL — ABNORMAL HIGH (ref 70–99)
Glucose, Bld: 175 mg/dL — ABNORMAL HIGH (ref 70–99)
Glucose, Bld: 168 mg/dL — ABNORMAL HIGH (ref 70–99)
POTASSIUM: 5.5 mmol/L — AB (ref 3.5–5.1)
Potassium: 5.1 mmol/L (ref 3.5–5.1)
Potassium: 5.1 mmol/L (ref 3.5–5.1)
SODIUM: 137 mmol/L (ref 135–145)
SODIUM: 137 mmol/L (ref 135–145)
Sodium: 141 mmol/L (ref 135–145)

## 2015-01-07 LAB — URINALYSIS, ROUTINE W REFLEX MICROSCOPIC
Bilirubin Urine: NEGATIVE
Glucose, UA: NEGATIVE mg/dL
Hgb urine dipstick: NEGATIVE
Ketones, ur: NEGATIVE mg/dL
Nitrite: NEGATIVE
PROTEIN: NEGATIVE mg/dL
Specific Gravity, Urine: 1.019 (ref 1.005–1.030)
UROBILINOGEN UA: 1 mg/dL (ref 0.0–1.0)
pH: 7 (ref 5.0–8.0)

## 2015-01-07 LAB — CBC WITH DIFFERENTIAL/PLATELET
BASOS ABS: 0 10*3/uL (ref 0.0–0.1)
BASOS PCT: 1 % (ref 0–1)
EOS PCT: 1 % (ref 0–5)
Eosinophils Absolute: 0 10*3/uL (ref 0.0–0.7)
HEMATOCRIT: 27.7 % — AB (ref 39.0–52.0)
Hemoglobin: 8.9 g/dL — ABNORMAL LOW (ref 13.0–17.0)
Lymphocytes Relative: 13 % (ref 12–46)
Lymphs Abs: 0.2 10*3/uL — ABNORMAL LOW (ref 0.7–4.0)
MCH: 29.3 pg (ref 26.0–34.0)
MCHC: 32.1 g/dL (ref 30.0–36.0)
MCV: 91.1 fL (ref 78.0–100.0)
MONOS PCT: 8 % (ref 3–12)
Monocytes Absolute: 0.2 10*3/uL (ref 0.1–1.0)
NEUTROS PCT: 77 % (ref 43–77)
Neutro Abs: 1.5 10*3/uL — ABNORMAL LOW (ref 1.7–7.7)
Platelets: 72 10*3/uL — ABNORMAL LOW (ref 150–400)
RBC: 3.04 MIL/uL — ABNORMAL LOW (ref 4.22–5.81)
RDW: 15.4 % (ref 11.5–15.5)
WBC: 1.9 10*3/uL — ABNORMAL LOW (ref 4.0–10.5)

## 2015-01-07 LAB — CBC
HEMATOCRIT: 26.3 % — AB (ref 39.0–52.0)
Hemoglobin: 8.5 g/dL — ABNORMAL LOW (ref 13.0–17.0)
MCH: 29.6 pg (ref 26.0–34.0)
MCHC: 32.3 g/dL (ref 30.0–36.0)
MCV: 91.6 fL (ref 78.0–100.0)
Platelets: 73 10*3/uL — ABNORMAL LOW (ref 150–400)
RBC: 2.87 MIL/uL — ABNORMAL LOW (ref 4.22–5.81)
RDW: 15.6 % — ABNORMAL HIGH (ref 11.5–15.5)
WBC: 2.1 10*3/uL — ABNORMAL LOW (ref 4.0–10.5)

## 2015-01-07 LAB — PROTIME-INR
INR: 1.5 — ABNORMAL HIGH (ref 0.00–1.49)
INR: 1.58 — ABNORMAL HIGH (ref 0.00–1.49)
PROTHROMBIN TIME: 19 s — AB (ref 11.6–15.2)
PROTHROMBIN TIME: 18.3 s — AB (ref 11.6–15.2)

## 2015-01-07 LAB — APTT: APTT: 34 s (ref 24–37)

## 2015-01-07 LAB — HEMOGLOBIN A1C
HEMOGLOBIN A1C: 6.3 % — AB (ref <5.7)
MEAN PLASMA GLUCOSE: 134 mg/dL — AB (ref <117)

## 2015-01-07 LAB — SURGICAL PCR SCREEN
MRSA, PCR: POSITIVE — AB
STAPHYLOCOCCUS AUREUS: POSITIVE — AB

## 2015-01-07 LAB — GLUCOSE, CAPILLARY
GLUCOSE-CAPILLARY: 152 mg/dL — AB (ref 70–99)
GLUCOSE-CAPILLARY: 155 mg/dL — AB (ref 70–99)
GLUCOSE-CAPILLARY: 157 mg/dL — AB (ref 70–99)
Glucose-Capillary: 147 mg/dL — ABNORMAL HIGH (ref 70–99)

## 2015-01-07 LAB — URINE MICROSCOPIC-ADD ON

## 2015-01-07 LAB — TSH: TSH: 1.728 u[IU]/mL (ref 0.350–4.500)

## 2015-01-07 LAB — PREPARE RBC (CROSSMATCH)

## 2015-01-07 LAB — LIPASE, BLOOD: Lipase: 26 U/L (ref 11–59)

## 2015-01-07 SURGERY — FIXATION, FRACTURE, INTERTROCHANTERIC, WITH INTRAMEDULLARY ROD
Anesthesia: General | Site: Hip | Laterality: Left

## 2015-01-07 MED ORDER — PROPOFOL 10 MG/ML IV BOLUS
INTRAVENOUS | Status: AC
  Filled 2015-01-07: qty 20

## 2015-01-07 MED ORDER — LISINOPRIL 5 MG PO TABS
5.0000 mg | ORAL_TABLET | Freq: Every day | ORAL | Status: DC
  Filled 2015-01-07: qty 1

## 2015-01-07 MED ORDER — ONDANSETRON HCL 4 MG/2ML IJ SOLN
INTRAMUSCULAR | Status: AC
  Filled 2015-01-07: qty 2

## 2015-01-07 MED ORDER — BUPROPION HCL ER (XL) 150 MG PO TB24
150.0000 mg | ORAL_TABLET | Freq: Every morning | ORAL | Status: DC
  Administered 2015-01-07 – 2015-01-10 (×4): 150 mg via ORAL
  Filled 2015-01-07 (×4): qty 1

## 2015-01-07 MED ORDER — INSULIN GLARGINE 100 UNIT/ML ~~LOC~~ SOLN
26.0000 [IU] | Freq: Two times a day (BID) | SUBCUTANEOUS | Status: DC
  Filled 2015-01-07 (×3): qty 0.26

## 2015-01-07 MED ORDER — PHENYLEPHRINE 40 MCG/ML (10ML) SYRINGE FOR IV PUSH (FOR BLOOD PRESSURE SUPPORT)
PREFILLED_SYRINGE | INTRAVENOUS | Status: AC
  Filled 2015-01-07: qty 10

## 2015-01-07 MED ORDER — POLYETHYLENE GLYCOL 3350 17 G PO PACK: 17.0000 g | PACK | Freq: Every day | ORAL | Status: DC | PRN

## 2015-01-07 MED ORDER — PROPOFOL 10 MG/ML IV BOLUS
INTRAVENOUS | Status: DC | PRN
  Administered 2015-01-07: 100 mg via INTRAVENOUS

## 2015-01-07 MED ORDER — LIDOCAINE HCL (CARDIAC) 20 MG/ML IV SOLN
INTRAVENOUS | Status: DC | PRN
  Administered 2015-01-07: 50 mg via INTRAVENOUS

## 2015-01-07 MED ORDER — ROCURONIUM BROMIDE 100 MG/10ML IV SOLN
INTRAVENOUS | Status: AC
  Filled 2015-01-07: qty 1

## 2015-01-07 MED ORDER — LEVOTHYROXINE SODIUM 25 MCG PO TABS
25.0000 ug | ORAL_TABLET | Freq: Every day | ORAL | Status: DC
  Administered 2015-01-08 – 2015-01-10 (×3): 25 ug via ORAL
  Filled 2015-01-07 (×5): qty 1

## 2015-01-07 MED ORDER — SIMVASTATIN 40 MG PO TABS
40.0000 mg | ORAL_TABLET | Freq: Every day | ORAL | Status: DC
  Administered 2015-01-08 – 2015-01-10 (×2): 40 mg via ORAL
  Filled 2015-01-07 (×4): qty 1

## 2015-01-07 MED ORDER — PHENYLEPHRINE HCL 10 MG/ML IJ SOLN
INTRAMUSCULAR | Status: DC | PRN
  Administered 2015-01-07: 80 ug via INTRAVENOUS

## 2015-01-07 MED ORDER — HYDROMORPHONE HCL 1 MG/ML IJ SOLN
1.0000 mg | INTRAMUSCULAR | Status: DC | PRN
  Administered 2015-01-07 – 2015-01-08 (×4): 1 mg via INTRAVENOUS
  Filled 2015-01-07 (×4): qty 1

## 2015-01-07 MED ORDER — IOHEXOL 300 MG/ML  SOLN: 25.0000 mL | INTRAMUSCULAR | Status: AC

## 2015-01-07 MED ORDER — FERROUS SULFATE 325 (65 FE) MG PO TABS
325.0000 mg | ORAL_TABLET | Freq: Every day | ORAL | Status: DC
  Administered 2015-01-08 – 2015-01-10 (×3): 325 mg via ORAL
  Filled 2015-01-07 (×5): qty 1

## 2015-01-07 MED ORDER — SODIUM CHLORIDE 0.9 % IV SOLN: Freq: Once | INTRAVENOUS | Status: DC

## 2015-01-07 MED ORDER — LACTATED RINGERS IV SOLN
INTRAVENOUS | Status: DC | PRN
  Administered 2015-01-07: 22:00:00 via INTRAVENOUS

## 2015-01-07 MED ORDER — MECLIZINE HCL 25 MG PO TABS
25.0000 mg | ORAL_TABLET | Freq: Every day | ORAL | Status: DC
Start: 2015-01-07 — End: 2015-01-10
  Administered 2015-01-08 – 2015-01-10 (×3): 25 mg via ORAL
  Filled 2015-01-07 (×4): qty 1

## 2015-01-07 MED ORDER — SUFENTANIL CITRATE 50 MCG/ML IV SOLN
INTRAVENOUS | Status: AC
  Filled 2015-01-07: qty 1

## 2015-01-07 MED ORDER — OXYCODONE HCL 5 MG/5ML PO SOLN
5.0000 mg | Freq: Once | ORAL | Status: AC | PRN
  Filled 2015-01-07: qty 5

## 2015-01-07 MED ORDER — SODIUM CHLORIDE 0.9 % IV SOLN
INTRAVENOUS | Status: DC | PRN
  Administered 2015-01-07 (×2): via INTRAVENOUS

## 2015-01-07 MED ORDER — METHOCARBAMOL 500 MG PO TABS
500.0000 mg | ORAL_TABLET | Freq: Four times a day (QID) | ORAL | Status: DC | PRN
  Administered 2015-01-10: 500 mg via ORAL
  Filled 2015-01-07: qty 1

## 2015-01-07 MED ORDER — VITAMIN D 1000 UNITS PO TABS
1000.0000 [IU] | ORAL_TABLET | Freq: Every morning | ORAL | Status: DC
  Administered 2015-01-08 – 2015-01-10 (×3): 1000 [IU] via ORAL
  Filled 2015-01-07 (×4): qty 1

## 2015-01-07 MED ORDER — MELATONIN 5 MG PO TABS: 10.0000 mg | ORAL_TABLET | Freq: Every day | ORAL | Status: DC

## 2015-01-07 MED ORDER — TAMSULOSIN HCL 0.4 MG PO CAPS
0.4000 mg | ORAL_CAPSULE | Freq: Every day | ORAL | Status: DC
  Administered 2015-01-07 – 2015-01-10 (×4): 0.4 mg via ORAL
  Filled 2015-01-07 (×4): qty 1

## 2015-01-07 MED ORDER — OXYCODONE HCL 5 MG PO TABS: 5.0000 mg | ORAL_TABLET | Freq: Once | ORAL | Status: AC | PRN

## 2015-01-07 MED ORDER — 0.9 % SODIUM CHLORIDE (POUR BTL) OPTIME
TOPICAL | Status: DC | PRN
  Administered 2015-01-07: 1000 mL

## 2015-01-07 MED ORDER — LAMOTRIGINE 25 MG PO TABS
50.0000 mg | ORAL_TABLET | Freq: Two times a day (BID) | ORAL | Status: DC
  Administered 2015-01-08 – 2015-01-10 (×5): 50 mg via ORAL
  Filled 2015-01-07 (×8): qty 2

## 2015-01-07 MED ORDER — METHOCARBAMOL 1000 MG/10ML IJ SOLN
500.0000 mg | Freq: Four times a day (QID) | INTRAVENOUS | Status: DC | PRN
  Filled 2015-01-07: qty 5

## 2015-01-07 MED ORDER — CLINDAMYCIN PHOSPHATE 900 MG/50ML IV SOLN
900.0000 mg | Freq: Once | INTRAVENOUS | Status: AC
  Administered 2015-01-07: 900 mg via INTRAVENOUS
  Filled 2015-01-07: qty 50

## 2015-01-07 MED ORDER — BENEFIBER PO POWD: 2.0000 mL | Freq: Every morning | ORAL | Status: DC

## 2015-01-07 MED ORDER — FENTANYL CITRATE 0.05 MG/ML IJ SOLN: 25.0000 ug | INTRAMUSCULAR | Status: DC | PRN

## 2015-01-07 MED ORDER — INSULIN ASPART 100 UNIT/ML ~~LOC~~ SOLN: 0.0000 [IU] | Freq: Three times a day (TID) | SUBCUTANEOUS | Status: DC

## 2015-01-07 MED ORDER — INSULIN ASPART 100 UNIT/ML ~~LOC~~ SOLN
0.0000 [IU] | SUBCUTANEOUS | Status: DC
Start: 2015-01-07 — End: 2015-01-10
  Administered 2015-01-07: 2 [IU] via SUBCUTANEOUS
  Administered 2015-01-08: 5 [IU] via SUBCUTANEOUS
  Administered 2015-01-08: 2 [IU] via SUBCUTANEOUS
  Administered 2015-01-08: 3 [IU] via SUBCUTANEOUS
  Administered 2015-01-08: 2 [IU] via SUBCUTANEOUS
  Administered 2015-01-08: 3 [IU] via SUBCUTANEOUS
  Administered 2015-01-09: 1 [IU] via SUBCUTANEOUS
  Administered 2015-01-09: 5 [IU] via SUBCUTANEOUS
  Administered 2015-01-09: 3 [IU] via SUBCUTANEOUS
  Administered 2015-01-10: 1 [IU] via SUBCUTANEOUS
  Administered 2015-01-10 (×2): 2 [IU] via SUBCUTANEOUS

## 2015-01-07 MED ORDER — CLINDAMYCIN PHOSPHATE 900 MG/50ML IV SOLN
INTRAVENOUS | Status: AC
  Filled 2015-01-07: qty 50

## 2015-01-07 MED ORDER — DEXTROSE 5 % IV SOLN
10.0000 mg | INTRAVENOUS | Status: DC | PRN
Start: 2015-01-07 — End: 2015-01-08
  Administered 2015-01-07: 25 ug/min via INTRAVENOUS

## 2015-01-07 MED ORDER — TRAZODONE HCL 50 MG PO TABS
75.0000 mg | ORAL_TABLET | Freq: Every day | ORAL | Status: DC
  Administered 2015-01-08 – 2015-01-10 (×2): 75 mg via ORAL
  Filled 2015-01-07 (×4): qty 1

## 2015-01-07 MED ORDER — FLUOXETINE HCL 20 MG PO CAPS
60.0000 mg | ORAL_CAPSULE | Freq: Every day | ORAL | Status: DC
  Administered 2015-01-07 – 2015-01-10 (×4): 60 mg via ORAL
  Filled 2015-01-07 (×4): qty 3

## 2015-01-07 MED ORDER — SUFENTANIL CITRATE 50 MCG/ML IV SOLN
INTRAVENOUS | Status: DC | PRN
  Administered 2015-01-07 (×2): 10 ug via INTRAVENOUS

## 2015-01-07 MED ORDER — FENOFIBRATE 160 MG PO TABS
160.0000 mg | ORAL_TABLET | Freq: Every morning | ORAL | Status: DC
  Administered 2015-01-07 – 2015-01-10 (×4): 160 mg via ORAL
  Filled 2015-01-07 (×4): qty 1

## 2015-01-07 MED ORDER — LIDOCAINE HCL (CARDIAC) 20 MG/ML IV SOLN
INTRAVENOUS | Status: AC
  Filled 2015-01-07: qty 5

## 2015-01-07 MED ORDER — SODIUM CHLORIDE 0.9 % IV SOLN
INTRAVENOUS | Status: DC | PRN
  Administered 2015-01-07: 22:00:00 via INTRAVENOUS

## 2015-01-07 MED ORDER — HYDROCODONE-ACETAMINOPHEN 5-325 MG PO TABS: 1.0000 | ORAL_TABLET | Freq: Four times a day (QID) | ORAL | Status: DC | PRN

## 2015-01-07 MED ORDER — PANTOPRAZOLE SODIUM 40 MG PO TBEC
40.0000 mg | DELAYED_RELEASE_TABLET | Freq: Every day | ORAL | Status: DC
  Administered 2015-01-08 – 2015-01-10 (×3): 40 mg via ORAL
  Filled 2015-01-07 (×3): qty 1

## 2015-01-07 MED ORDER — SUCCINYLCHOLINE CHLORIDE 20 MG/ML IJ SOLN
INTRAMUSCULAR | Status: DC | PRN
  Administered 2015-01-07: 100 mg via INTRAVENOUS

## 2015-01-07 MED ORDER — HYDROMORPHONE HCL 2 MG/ML IJ SOLN
INTRAMUSCULAR | Status: AC
  Filled 2015-01-07: qty 1

## 2015-01-07 MED ORDER — ONDANSETRON HCL 4 MG/2ML IJ SOLN: 4.0000 mg | Freq: Three times a day (TID) | INTRAMUSCULAR | Status: DC | PRN

## 2015-01-07 MED ORDER — DEXAMETHASONE SODIUM PHOSPHATE 10 MG/ML IJ SOLN
INTRAMUSCULAR | Status: DC | PRN
  Administered 2015-01-07: 10 mg via INTRAVENOUS

## 2015-01-07 MED ORDER — ADULT MULTIVITAMIN W/MINERALS CH
1.0000 | ORAL_TABLET | Freq: Every morning | ORAL | Status: DC
  Administered 2015-01-08 – 2015-01-10 (×3): 1 via ORAL
  Filled 2015-01-07 (×4): qty 1

## 2015-01-07 SURGICAL SUPPLY — 46 items
BLADE SURG 15 STRL LF DISP TIS (BLADE) ×1 IMPLANT
BLADE SURG 15 STRL SS (BLADE) ×3
BNDG GAUZE ELAST 4 BULKY (GAUZE/BANDAGES/DRESSINGS) ×3 IMPLANT
COVER PERINEAL POST (MISCELLANEOUS) ×3 IMPLANT
COVER SURGICAL LIGHT HANDLE (MISCELLANEOUS) ×3 IMPLANT
DRAPE PROXIMA HALF (DRAPES) IMPLANT
DRAPE STERI IOBAN 125X83 (DRAPES) ×3 IMPLANT
DRSG MEPILEX BORDER 4X4 (GAUZE/BANDAGES/DRESSINGS) ×3 IMPLANT
DRSG MEPILEX BORDER 4X8 (GAUZE/BANDAGES/DRESSINGS) ×3 IMPLANT
DRSG PAD ABDOMINAL 8X10 ST (GAUZE/BANDAGES/DRESSINGS) ×6 IMPLANT
DURAPREP 26ML APPLICATOR (WOUND CARE) ×3 IMPLANT
ELECT REM PT RETURN 9FT ADLT (ELECTROSURGICAL) ×3
ELECTRODE REM PT RTRN 9FT ADLT (ELECTROSURGICAL) ×1 IMPLANT
FACESHIELD WRAPAROUND (MASK) IMPLANT
FACESHIELD WRAPAROUND OR TEAM (MASK) ×1 IMPLANT
GAUZE XEROFORM 5X9 LF (GAUZE/BANDAGES/DRESSINGS) ×3 IMPLANT
GLOVE BIOGEL PI IND STRL 8 (GLOVE) ×2 IMPLANT
GLOVE BIOGEL PI INDICATOR 8 (GLOVE) ×4
GLOVE ECLIPSE 8.0 STRL XLNG CF (GLOVE) ×3 IMPLANT
GLOVE ORTHO TXT STRL SZ7.5 (GLOVE) ×3 IMPLANT
GOWN STRL REUS W/ TWL XL LVL3 (GOWN DISPOSABLE) IMPLANT
GOWN STRL REUS W/TWL LRG LVL3 (GOWN DISPOSABLE) ×3 IMPLANT
GOWN STRL REUS W/TWL XL LVL3 (GOWN DISPOSABLE)
GUIDE PIN 3.2 LONG (PIN) ×2 IMPLANT
GUIDE PIN 3.2MM (MISCELLANEOUS) ×3
GUIDE PIN ORTH 343X3.2XBRAD (MISCELLANEOUS) IMPLANT
HIP SCREW SET (Screw) ×2 IMPLANT
KIT BASIN OR (CUSTOM PROCEDURE TRAY) ×3 IMPLANT
KIT ROOM TURNOVER OR (KITS) ×3 IMPLANT
MANIFOLD NEPTUNE II (INSTRUMENTS) ×3 IMPLANT
NAIL IMHS L 10X38 (Nail) ×2 IMPLANT
NS IRRIG 1000ML POUR BTL (IV SOLUTION) ×3 IMPLANT
PACK GENERAL/GYN (CUSTOM PROCEDURE TRAY) ×3 IMPLANT
PAD ARMBOARD 7.5X6 YLW CONV (MISCELLANEOUS) ×6 IMPLANT
PAD CAST 4YDX4 CTTN HI CHSV (CAST SUPPLIES) ×2 IMPLANT
PADDING CAST COTTON 4X4 STRL (CAST SUPPLIES) ×6
SCREW COMPRESSION (Screw) ×3 IMPLANT
SCREW LAG 85MM (Screw) ×3 IMPLANT
SCREW LAGSTD 85X21X12.7X9 (Screw) IMPLANT
STAPLER VISISTAT 35W (STAPLE) ×3 IMPLANT
SUT VIC AB 0 CT1 27 (SUTURE) ×6
SUT VIC AB 0 CT1 27XBRD ANBCTR (SUTURE) ×2 IMPLANT
SUT VIC AB 2-0 CT1 27 (SUTURE) ×6
SUT VIC AB 2-0 CT1 TAPERPNT 27 (SUTURE) ×2 IMPLANT
TOWEL OR 17X24 6PK STRL BLUE (TOWEL DISPOSABLE) ×3 IMPLANT
TOWEL OR 17X26 10 PK STRL BLUE (TOWEL DISPOSABLE) ×3 IMPLANT

## 2015-01-07 NOTE — Progress Notes (Signed)
Clinical Social Work Department BRIEF PSYCHOSOCIAL ASSESSMENT 01/07/2015  Patient:  [name], [name]     Account Number:  [id number]     Admit date:  01/06/2015  Clinical Social Worker:  Lacie Scotts  Date/Time:  01/07/2015 02:57 PM  Referred by:  Physician  Date Referred:  01/07/2015 Referred for  SNF Placement   Other Referral:   Interview type:  Family Other interview type:    PSYCHOSOCIAL DATA Living Status:  FACILITY Admitted from facility:  Other Level of care:  Assisted Living Primary support name:  [name] Primary support relationship to patient:  SIBLING Degree of support available:   supportive    CURRENT CONCERNS Current Concerns  Post-Acute Placement   Other Concerns:    SOCIAL WORK ASSESSMENT / PLAN Pt is a 69 yr old gentleman admitted from [facility] following a fall. Pt has a hip fx and is scheduled for surgery today. Pt gave CSW permission to speak with his sister regarding d/c planning. CSW Met with pt's sister this afternoon. ST Rehab will be needed following hospital d/c with plans to return to ALF once rehab is completed. SNF search has been initiated and bed offers are pending. Margareta, RCC, from ALF contacted and plan reviewed with her. She is willing to readmit pt following the completion of rehab. CSW will continue to follow to assist with d/c planning to SNF.   Assessment/plan status:  Psychosocial Support/Ongoing Assessment of Needs Other assessment/ plan:   Information/referral to community resources:   SNF vs ALF placement reviewed    PATIENT'S/FAMILY'S RESPONSE TO PLAN OF CARE: Pt was uncomfortably during CSW visit and requested CSW speak with his sister. Ms. Doren Custard reported her brother has difficulty, on occasion, controlling his feelings/ behavior. He has occasional outbursts. Pt has been receiving counseling. Last outburst occurred last week when he was told his counseling was being d/c. Pt's medical status ( low  BP, not having breakfast ) added to his inability to control his behavior, according to Ms. [name]. Pt does respond positively to attention, though clear boundaries are needed. Sister reports that pt may misinterpret staff's attention.   Werner Lean LCSW [phone]

## 2015-01-07 NOTE — H&P (Signed)
[facility] History and Physical  MANCEL LARDIZABAL GMW:[id number] DOB: 01-02-1946 DOA: 01/06/2015  Referring physician: ED physician PCP: [name], MD  Specialists:   Chief Complaint: Left hip pain after fall, nausea, vomiting, diarrhea, abdominal pain  HPI: [name] is a 69 y.o. male with past medical history of hyperlipidemia, hypertension, GERD, diabetes mellitus, chronic kidney disease-stage III, vertigo, history of IBD per patient, hypothyroidism, alcoholic cirrhosis, who presents with left hip pain after fall, nausea, vomiting, diarrhea, abdominal pain.  Patient reports that he had a mechanic fall at about 8:00 PM which he attributes to his vertigo. Pt fell on to his left side and hit his head. Pt developed severe L hip pain immediately after fall. He is unable to get up, or move his leg. No numbness or tingling over left leg.   He also reports nausea, vomiting, diarrhea and abdominal pain, which have been going on for about 1 week. He had 1 bowel movement with loose stool today. He vomited 4 times without blood in the vomitus. He does not have fever or chills. He denies recent antibiotics use. Patient has mild chronic dry cough which has not changed. No chest pain or shortness of breath. Patient does not have symptoms for UTI. No leg edema or skin rashes.  In ED, x-ray showed comminuted and impacted acute posttraumatic fractures of the inter trochanteric left hip with varus angulation. CBC showed pancytopenia which is chronic. INR 1.50. CT head is negative for acute abnormalities. Chest x-ray showed mild pulmonary edema. Patient is admitted to inpatient for further evaluation and treatment. Orthopedic surgeon was consulted by ED.  Review of Systems: As presented in the history of presenting illness, rest negative.  Where does patient live?  At home Can patient participate in ADLs? Yes  Allergy:  Allergies  Allergen Reactions  . Penicillins Hives    Past Medical  History  Diagnosis Date  . Hyperglycemia   . Hyponatremia   . DMII (diabetes mellitus, type 2)   . Depression   . IBS (irritable bowel syndrome)   . Chest pain   . Unspecified gastritis and gastroduodenitis without mention of hemorrhage   . Arthropathy, unspecified, site unspecified   . History of colon polyps   . Anemia, unspecified   . Anxiety   . GERD (gastroesophageal reflux disease)   . Hyperlipidemia   . Hypertension     Past Surgical History  Procedure Laterality Date  . Hernia repair  1964    Social History:  reports that he has quit smoking. His smoking use included Cigarettes. He smoked 1.00 pack per day. He does not have any smokeless tobacco history on file. He reports that he does not drink alcohol or use illicit drugs.  Family History:  Family History  Problem Relation Age of Onset  . Stroke Mother   . Heart attack Father   . Heart disease Father   . Cancer Other     FH of Breast Cancer-other Relative  . Heart disease Other     Parent  . Hypertension Other     parent  . Cancer Maternal Uncle     colon     Prior to Admission medications   Medication Sig Start Date End Date Taking? Authorizing Provider  buPROPion (WELLBUTRIN XL) 150 MG 24 hr tablet Take 150 mg by mouth every morning.    Yes Historical Provider, MD  Cholecalciferol (VITAMIN D3) 1000 UNITS CAPS Take 1,000 Units by mouth every morning.    Yes  Historical Provider, MD  fenofibrate 160 MG tablet Take 160 mg by mouth every morning.    Yes Historical Provider, MD  ferrous sulfate 325 (65 FE) MG tablet Take 325 mg by mouth daily with breakfast.   Yes Historical Provider, MD  FLUoxetine (PROZAC) 20 MG capsule Take 60 mg by mouth daily.   Yes Historical Provider, MD  glucose blood test strip 1 each by Other route 4 (four) times daily -  before meals and at bedtime. Use as instructed   Yes Historical Provider, MD  HYDROcodone-acetaminophen (NORCO/VICODIN) 5-325 MG per tablet Take 1 tablet by mouth  every 6 (six) hours as needed for moderate pain.  06/25/13  Yes Historical Provider, MD  insulin aspart (NOVOLOG) 100 UNIT/ML injection Inject 25-30 Units into the skin 3 (three) times daily with meals. Inject 25 units at breakfast, 30 units with lunch, and 30 units with supper   Yes Historical Provider, MD  insulin glargine (LANTUS) 100 UNIT/ML injection Inject 36 Units into the skin 2 (two) times daily.    Yes Historical Provider, MD  lamoTRIgine (LAMICTAL) 25 MG tablet Take 50 mg by mouth 2 (two) times daily.   Yes Historical Provider, MD  levothyroxine (SYNTHROID, LEVOTHROID) 25 MCG tablet Take 25 mcg by mouth daily. 10/10/13  Yes Historical Provider, MD  lisinopril (PRINIVIL,ZESTRIL) 5 MG tablet Take 5 mg by mouth daily. 12/11/13  Yes Historical Provider, MD  meclizine (ANTIVERT) 25 MG tablet Take 25 mg by mouth daily.    Yes Historical Provider, MD  Melatonin 5 MG TABS Take 10 mg by mouth daily at 8 pm.   Yes Historical Provider, MD  Multiple Vitamin (DAILY VITE) TABS Take 1 tablet by mouth every morning.    Yes Historical Provider, MD  omeprazole (PRILOSEC) 20 MG capsule Take 20 mg by mouth every morning.    Yes Historical Provider, MD  simvastatin (ZOCOR) 40 MG tablet Take 40 mg by mouth at bedtime.  11/12/13  Yes Historical Provider, MD  tamsulosin (FLOMAX) 0.4 MG CAPS capsule Take 0.4 mg by mouth daily.    Yes Historical Provider, MD  traZODone (DESYREL) 50 MG tablet Take 75 mg by mouth at bedtime.    Yes Historical Provider, MD  polyethylene glycol (MIRALAX / GLYCOLAX) packet Take 17 g by mouth daily as needed for mild constipation.     Historical Provider, MD  Wheat Dextrin (BENEFIBER) POWD Take 2 mLs by mouth every morning. Mix in 4 oz of water and drink    Historical Provider, MD    Physical Exam: Filed Vitals:   01/06/15 2209 01/07/15 0056 01/07/15 0315  BP: 150/61 109/48 118/58  Pulse: 60 66 69  Temp: 98.3 F (36.8 C)  98 F (36.7 C)  TempSrc: Oral  Oral  Resp: 18 16    Height:   5' 6"  (1.676 m)  Weight:   86.7 kg (191 lb 2.2 oz)  SpO2: 98% 99% 90%   General: Not in acute distress, dry mucous and membrane HEENT:       Eyes: PERRL, EOMI, no scleral icterus       ENT: No discharge from the ears and nose, no pharynx injection, no tonsillar enlargement.        Neck: No JVD, no bruit, no mass felt. Cardiac: D9/M4, RRR, 3/6 systolic murmurs, No gallops or rubs Pulm: Good air movement bilaterally. Clear to auscultation bilaterally. No rales, wheezing, rhonchi or rubs. Abd: Soft, mildly distended, tenderness over Left middle quadrant, no rebound pain, BS present  Ext: No edema bilaterally. 2+DP/PT pulse bilaterally Musculoskeletal: LLE is externally rotated. Pt has left hip tenderness. Skin: No rashes.  Neuro: Alert and oriented X3, cranial nerves II-XII grossly intact, muscle strength 5/5 in all extremeties, sensation to light touch intact.  Psych: Patient is not psychotic, no suicidal or hemocidal ideation.  Labs on Admission:  Basic Metabolic Panel:  Recent Labs Lab 01/07/15 0015  NA 141  K 5.1  CL 109  CO2 24  GLUCOSE 159*  BUN 23  CREATININE 1.40*  CALCIUM 9.0   Liver Function Tests: No results for input(s): AST, ALT, ALKPHOS, BILITOT, PROT, ALBUMIN in the last 168 hours. No results for input(s): LIPASE, AMYLASE in the last 168 hours. No results for input(s): AMMONIA in the last 168 hours. CBC:  Recent Labs Lab 01/07/15 0015  WBC 1.9*  NEUTROABS 1.5*  HGB 8.9*  HCT 27.7*  MCV 91.1  PLT 72*   Cardiac Enzymes: No results for input(s): CKTOTAL, CKMB, CKMBINDEX, TROPONINI in the last 168 hours.  BNP (last 3 results) No results for input(s): PROBNP in the last 8760 hours. CBG: No results for input(s): GLUCAP in the last 168 hours.  Radiological Exams on Admission: Dg Chest 1 View  01/07/2015   CLINICAL DATA:  Weakness today. Fall. Tripped over car present. Possible surgery to repair hip fracture.  EXAM: CHEST - 1 VIEW  COMPARISON:   01/04/2014  FINDINGS: Shallow inspiration. Mild cardiac enlargement and pulmonary vascular prominence, some of which may be due to shallow inspiration. No focal airspace disease to suggest edema or consolidation. No blunting of costophrenic angles. No pneumothorax.  IMPRESSION: Cardiac enlargement with mild pulmonary vascular prominence. No evidence of edema or consolidation.   Electronically Signed   By: Lucienne Capers M.D.   On: 01/07/2015 00:17   Ct Head Wo Contrast  01/07/2015   CLINICAL DATA:  Patient fell today on the left side, striking a hardwood floor. No loss of consciousness.  EXAM: CT HEAD WITHOUT CONTRAST  TECHNIQUE: Contiguous axial images were obtained from the base of the skull through the vertex without intravenous contrast.  COMPARISON:  10/27/2013  FINDINGS: Mild cerebral atrophy. No ventricular dilatation. No mass effect or midline shift. No abnormal extra-axial fluid collections. Gray-white matter junctions are distinct. Basal cisterns are not effaced. No evidence of acute intracranial hemorrhage. No depressed skull fractures. Opacification of a left ethmoid air cell. Paranasal sinuses and mastoid air cells are otherwise clear.  IMPRESSION: No acute intracranial abnormalities.  Mild chronic atrophy.   Electronically Signed   By: Lucienne Capers M.D.   On: 01/07/2015 00:27   Dg Hip Unilat With Pelvis 2-3 Views Left  01/06/2015   CLINICAL DATA:  Trip and fall injury with left hip pain. Unable to ambulate.  EXAM: DG HIP W/ PELVIS 2-3V*L*  COMPARISON:  None.  FINDINGS: Comminuted inter trochanteric fractures of the left hip with superior displacement of the distal fracture fragment resulting in varus angulation. Mild displacement of greater and lesser trochanteric fragments. Impaction of fracture fragments. No dislocation at the hip joint. Pelvis and right hip appear intact. Vascular calcifications.  IMPRESSION: Comminuted and impacted acute posttraumatic fractures of the inter  trochanteric left hip with varus angulation.   Electronically Signed   By: Lucienne Capers M.D.   On: 01/06/2015 23:34    EKG: Independently reviewed.   Assessment/Plan Principal Problem:   Hip fracture, left Active Problems:   Depression   Anxiety   GERD (gastroesophageal reflux disease)   Hyperlipidemia  Hypertension   DM (diabetes mellitus)   Other pancytopenia   Closed left hip fracture   Nausea vomiting and diarrhea   Hypothyroidism   Left hip fracture: as evidenced by x-ray.  Orthopedic surgeon was consulted by ED. Dr. Ninfa Linden will see patient in morning  -Will admit to med-surg bed - Pain control:  morphine prn and Norco - follow up ortho recs - NPO after MN - type and screen - INR/PTT  Per Dr. Ninfa Linden: patient will need: - [facility] traction (ortho tech called). - NPO after breakfast - Pt will likely go to the OR in the evening tomorrow - He will see pt in the morning  Diabetes mellitus: Patient carries diagnosis of type 1 diabetes on his list, but patient told me he has type 2 diabetes. Last A1c was 9.2 on 01/09/14. Patient is on Lantus 36 units twice a day. -Decrease Lantus dose from 36-26 -Sliding-scale insulin - A1c  Nausea, vomiting, diarrhea, abdominal pain: Etiology is not clear. Patient had CT abdomen/pelvis on 07/06/13 which showed splenomegaly with splenic infarction, but no cirrhosis. Patient denies recent antibiotics use.  -Repeat CT abdomen/pelvis -Check lipase -Check C. difficile and GI pathogen panel - IFV: NS  125cc/h - zofran for nausea  Pancytopenia: patient has beednfollowed up by dr. Alen Blew. Last seen was 09/26/14 . Per Dr. Hazeline Junker note, it is likely related to cirrhosis of the liver and splenomegaly. Hematological disorder a possibility but his workup had been unrevealing. Bone marrow biopsy done on 07/18/2013 did not no evidence of any myelodysplasia or lymphoproliferative disorder. His hemoglobin is stable. Today hemoglobin is 8.9 which  was 9.0 on 09/26/14. Patient is on iron supplement.  -continue iron supplement -f/up with dr. Alen Blew as outpt  Hypothyroidism: Patient is on Synthroid at home. TSH was 5.282 on 07/20/13 -Continue Synthroid -Check TSH  HTN:  -Continue lisinopril  GERD -PPI  Hyperlipidemia -Continue Zocor  Vertigo: -Continue Meclizine   DVT ppx: SCD  Code Status: Full code Family Communication: None at bed side.        Disposition Plan: Admit to inpatient   Date of Service 01/07/2015    Ivor Costa [facility] Pager [phone]  If 7PM-7AM, please contact night-coverage [email or web address] Password [email or web address] 01/07/2015, 4:08 AM

## 2015-01-07 NOTE — Progress Notes (Signed)
OT Cancellation Note  Patient Details Name: Cher NakaiMichael L Plair MRN: [id number] DOB: [birth year]   Cancelled Treatment:    Reason Eval/Treat Not Completed: Other (comment).  Please reorder OT after sx.  Thank you.  Paislee Szatkowski 01/07/2015, 7:35 AM  Marica OtterMaryellen Marcus Schwandt, OTR/L [phone] 01/07/2015

## 2015-01-07 NOTE — Progress Notes (Signed)
PT Cancellation Note  Patient Details Name: [name] MRN: [id number] DOB: 1946/10/25   Cancelled Treatment:    Reason Eval/Treat Not Completed: PT screened, no needs identified, will sign off (patient has a fractured hip, ortho consult this AM. will need new orders for mobility. please reorder. patient is in traction.)   [name], [name] 01/07/2015, 7:05 AM [name] PT [phone]

## 2015-01-07 NOTE — ED Provider Notes (Addendum)
CSN: [id number]     Arrival date & time 01/06/15  2201 History   First MD Initiated Contact with Patient 01/06/15 2258     Chief Complaint  Patient presents with  . Fall  . Hip Pain     (Consider location/radiation/quality/duration/timing/severity/associated sxs/prior Treatment) HPI Comments: Pt with hx of iddm, HTN, HL comes in post fall. Pt had a mechanical fall due to his vertigo. Pt fell on to his left side, hit his head. He is not on blood thinners. Pt had severe L hip pain immediately, and unable to get up, or move his leg. He has no pain elsewhere. No numbness, tingling.  Patient is a 69 y.o. male presenting with fall and hip pain. The history is provided by the patient.  Fall Pertinent negatives include no chest pain, no abdominal pain, no headaches and no shortness of breath.  Hip Pain Pertinent negatives include no chest pain, no abdominal pain, no headaches and no shortness of breath.    Past Medical History  Diagnosis Date  . Hyperglycemia   . Hyponatremia   . DMII (diabetes mellitus, type 2)   . Depression   . IBS (irritable bowel syndrome)   . Chest pain   . Unspecified gastritis and gastroduodenitis without mention of hemorrhage   . Arthropathy, unspecified, site unspecified   . History of colon polyps   . Anemia, unspecified   . Anxiety   . GERD (gastroesophageal reflux disease)   . Hyperlipidemia   . Hypertension    Past Surgical History  Procedure Laterality Date  . Hernia repair  1964   Family History  Problem Relation Age of Onset  . Stroke Mother   . Heart attack Father   . Heart disease Father   . Cancer Other     FH of Breast Cancer-other Relative  . Heart disease Other     Parent  . Hypertension Other     parent  . Cancer Maternal Uncle     colon   History  Substance Use Topics  . Smoking status: Former Smoker -- 1.00 packs/day    Types: Cigarettes  . Smokeless tobacco: Not on file  . Alcohol Use: No    Review of Systems   Constitutional: Positive for activity change. Negative for fever and chills.  Eyes: Negative for visual disturbance.  Respiratory: Negative for shortness of breath.   Cardiovascular: Negative for chest pain.  Gastrointestinal: Negative for nausea and abdominal pain.  Genitourinary: Negative for dysuria.  Musculoskeletal: Positive for arthralgias. Negative for back pain, neck pain and neck stiffness.  Skin: Negative for rash.  Neurological: Positive for dizziness. Negative for light-headedness and headaches.  Hematological: Does not bruise/bleed easily.  Psychiatric/Behavioral: Negative for confusion.      Allergies  Penicillins  Home Medications   Prior to Admission medications   Medication Sig Start Date End Date Taking? Authorizing Provider  buPROPion (WELLBUTRIN XL) 150 MG 24 hr tablet Take 150 mg by mouth every morning.    Yes Historical Provider, MD  Cholecalciferol (VITAMIN D3) 1000 UNITS CAPS Take 1,000 Units by mouth every morning.    Yes Historical Provider, MD  fenofibrate 160 MG tablet Take 160 mg by mouth every morning.    Yes Historical Provider, MD  ferrous sulfate 325 (65 FE) MG tablet Take 325 mg by mouth daily with breakfast.   Yes Historical Provider, MD  FLUoxetine (PROZAC) 20 MG capsule Take 60 mg by mouth daily.   Yes Historical Provider, MD  glucose blood  test strip 1 each by Other route 4 (four) times daily -  before meals and at bedtime. Use as instructed   Yes Historical Provider, MD  HYDROcodone-acetaminophen (NORCO/VICODIN) 5-325 MG per tablet Take 1 tablet by mouth every 6 (six) hours as needed for moderate pain.  06/25/13  Yes Historical Provider, MD  insulin aspart (NOVOLOG) 100 UNIT/ML injection Inject 25-30 Units into the skin 3 (three) times daily with meals. Inject 25 units at breakfast, 30 units with lunch, and 30 units with supper   Yes Historical Provider, MD  insulin glargine (LANTUS) 100 UNIT/ML injection Inject 36 Units into the skin 2 (two)  times daily.    Yes Historical Provider, MD  lamoTRIgine (LAMICTAL) 25 MG tablet Take 50 mg by mouth 2 (two) times daily.   Yes Historical Provider, MD  levothyroxine (SYNTHROID, LEVOTHROID) 25 MCG tablet Take 25 mcg by mouth daily. 10/10/13  Yes Historical Provider, MD  lisinopril (PRINIVIL,ZESTRIL) 5 MG tablet Take 5 mg by mouth daily. 12/11/13  Yes Historical Provider, MD  meclizine (ANTIVERT) 25 MG tablet Take 25 mg by mouth daily.    Yes Historical Provider, MD  Melatonin 5 MG TABS Take 10 mg by mouth daily at 8 pm.   Yes Historical Provider, MD  Multiple Vitamin (DAILY VITE) TABS Take 1 tablet by mouth every morning.    Yes Historical Provider, MD  omeprazole (PRILOSEC) 20 MG capsule Take 20 mg by mouth every morning.    Yes Historical Provider, MD  simvastatin (ZOCOR) 40 MG tablet Take 40 mg by mouth at bedtime.  11/12/13  Yes Historical Provider, MD  tamsulosin (FLOMAX) 0.4 MG CAPS capsule Take 0.4 mg by mouth daily.    Yes Historical Provider, MD  traZODone (DESYREL) 50 MG tablet Take 75 mg by mouth at bedtime.    Yes Historical Provider, MD  polyethylene glycol (MIRALAX / GLYCOLAX) packet Take 17 g by mouth daily as needed for mild constipation.     Historical Provider, MD  Wheat Dextrin (BENEFIBER) POWD Take 2 mLs by mouth every morning. Mix in 4 oz of water and drink    Historical Provider, MD   BP 150/61 mmHg  Pulse 60  Temp(Src) 98.3 F (36.8 C) (Oral)  Resp 18  SpO2 98% Physical Exam  Constitutional: He is oriented to person, place, and time. He appears well-developed.  HENT:  Head: Normocephalic and atraumatic.  No midline c-spine tenderness, pt able to turn head to 45 degrees bilaterally without any pain and able to flex neck to the chest and extend without any pain or neurologic symptoms.   Eyes: Conjunctivae and EOM are normal. Pupils are equal, round, and reactive to light.  Neck: Normal range of motion. Neck supple.  Cardiovascular: Normal rate, regular rhythm and  intact distal pulses.   Pulmonary/Chest: Effort normal and breath sounds normal.  Abdominal: Soft. Bowel sounds are normal. He exhibits no distension. There is no tenderness. There is no rebound and no guarding.  Musculoskeletal:  LLE is externally rotated. Pt has left hip tenderness. OTHERWISE:  Head to toe evaluation shows no hematoma, bleeding of the scalp, no facial abrasions, step offs, crepitus, no tenderness to palpation of the bilateral upper extremities, no chest tenderness.   Neurological: He is alert and oriented to person, place, and time.  Skin: Skin is warm.  Nursing note and vitals reviewed.   ED Course  Procedures (including critical care time) Labs Review Labs Reviewed  BASIC METABOLIC PANEL  CBC WITH DIFFERENTIAL/PLATELET  PROTIME-INR  TYPE AND SCREEN    Imaging Review Dg Hip Unilat With Pelvis 2-3 Views Left  01/06/2015   CLINICAL DATA:  Trip and fall injury with left hip pain. Unable to ambulate.  EXAM: DG HIP W/ PELVIS 2-3V*L*  COMPARISON:  None.  FINDINGS: Comminuted inter trochanteric fractures of the left hip with superior displacement of the distal fracture fragment resulting in varus angulation. Mild displacement of greater and lesser trochanteric fragments. Impaction of fracture fragments. No dislocation at the hip joint. Pelvis and right hip appear intact. Vascular calcifications.  IMPRESSION: Comminuted and impacted acute posttraumatic fractures of the inter trochanteric left hip with varus angulation.   Electronically Signed   By: Burman Nieves M.D.   On: 01/06/2015 23:34     EKG Interpretation None      MDM   Final diagnoses:  Hip pain  Closed left hip fracture, initial encounter   DDx includes: - Mechanical falls - ICH - Fractures - Contusions - Soft tissue injury  Pt comes in post mechanical fall. L leg is externally rotated, neurovascularly intact. Xray confirms L hip fracture. Ortho paged. Basic labs and EKG ordered. Ct head  ordered as pt is elderly, and hit his head when he fell.  CXR for surgery clearance ordered.  Derwood Kaplan, MD 01/07/15 0016  1:32 AM Spoke with Dr. Rayburn Ma, he recommends.  - [facility] traction (ortho tech called). - NPO after breakfast - Pt will likely go to the OR in the evening tomorrow - He will see pt in the morning   Derwood Kaplan, MD 01/07/15 [phone]

## 2015-01-07 NOTE — Consult Note (Signed)
Reason for Consult:  Left hip fracture Referring Physician:   EDP  [name] is an 69 y.o. male.  HPI:   69 yo male with multiple medical problems who sustained a mechanical fall last evening at home.  Was transported to the [facility] ED with a complaint of left hip pain.  Was found to have a displaced left hip intertrochanteric fracture.  He was admitted to [facility] service for medical management prior to surgery.  I had his left leg placed in 10 lbs of buck's traction.  Past Medical History  Diagnosis Date  . Hyperglycemia   . Hyponatremia   . DMII (diabetes mellitus, type 2)   . Depression   . IBS (irritable bowel syndrome)   . Chest pain   . Unspecified gastritis and gastroduodenitis without mention of hemorrhage   . Arthropathy, unspecified, site unspecified   . History of colon polyps   . Anemia, unspecified   . Anxiety   . GERD (gastroesophageal reflux disease)   . Hyperlipidemia   . Hypertension     Past Surgical History  Procedure Laterality Date  . Hernia repair  1964    Family History  Problem Relation Age of Onset  . Stroke Mother   . Heart attack Father   . Heart disease Father   . Cancer Other     FH of Breast Cancer-other Relative  . Heart disease Other     Parent  . Hypertension Other     parent  . Cancer Maternal Uncle     colon    Social History:  reports that he has quit smoking. His smoking use included Cigarettes. He smoked 1.00 pack per day. He does not have any smokeless tobacco history on file. He reports that he does not drink alcohol or use illicit drugs.  Allergies:  Allergies  Allergen Reactions  . Penicillins Hives    Medications: I have reviewed the patient's current medications.  Results for orders placed or performed during the hospital encounter of 01/06/15 (from the past 48 hour(s))  Basic metabolic panel     Status: Abnormal   Collection Time: 01/07/15 12:15 AM  Result Value Ref Range   Sodium 141 135 - 145  mmol/L   Potassium 5.1 3.5 - 5.1 mmol/L   Chloride 109 96 - 112 mmol/L   CO2 24 19 - 32 mmol/L   Glucose, Bld 159 (H) 70 - 99 mg/dL   BUN 23 6 - 23 mg/dL   Creatinine, Ser 1.40 (H) 0.50 - 1.35 mg/dL   Calcium 9.0 8.4 - 10.5 mg/dL   GFR calc non Af Amer 50 (L) >90 mL/min   GFR calc Af Amer 59 (L) >90 mL/min    Comment: (NOTE) The eGFR has been calculated using the CKD EPI equation. This calculation has not been validated in all clinical situations. eGFR's persistently <90 mL/min signify possible Chronic Kidney Disease.    Anion gap 8 5 - 15  CBC WITH DIFFERENTIAL     Status: Abnormal   Collection Time: 01/07/15 12:15 AM  Result Value Ref Range   WBC 1.9 (L) 4.0 - 10.5 K/uL   RBC 3.04 (L) 4.22 - 5.81 MIL/uL   Hemoglobin 8.9 (L) 13.0 - 17.0 g/dL   HCT 27.7 (L) 39.0 - 52.0 %   MCV 91.1 78.0 - 100.0 fL   MCH 29.3 26.0 - 34.0 pg   MCHC 32.1 30.0 - 36.0 g/dL   RDW 15.4 11.5 - 15.5 %  Platelets 72 (L) 150 - 400 K/uL    Comment: REPEATED TO VERIFY SPECIMEN CHECKED FOR CLOTS PLATELET COUNT CONFIRMED BY SMEAR    Neutrophils Relative % 77 43 - 77 %   Lymphocytes Relative 13 12 - 46 %   Monocytes Relative 8 3 - 12 %   Eosinophils Relative 1 0 - 5 %   Basophils Relative 1 0 - 1 %   Neutro Abs 1.5 (L) 1.7 - 7.7 K/uL   Lymphs Abs 0.2 (L) 0.7 - 4.0 K/uL   Monocytes Absolute 0.2 0.1 - 1.0 K/uL   Eosinophils Absolute 0.0 0.0 - 0.7 K/uL   Basophils Absolute 0.0 0.0 - 0.1 K/uL   WBC Morphology WHITE COUNT CONFIRMED ON SMEAR    Smear Review PLATELET COUNT CONFIRMED BY SMEAR   Protime-INR     Status: Abnormal   Collection Time: 01/07/15 12:15 AM  Result Value Ref Range   Prothrombin Time 18.3 (H) 11.6 - 15.2 seconds   INR 1.50 (H) 0.00 - 1.49  Type and screen     Status: None   Collection Time: 01/07/15 12:15 AM  Result Value Ref Range   ABO/RH(D) O POS    Antibody Screen NEG    Sample Expiration 01/10/2015   Protime-INR     Status: Abnormal   Collection Time: 01/07/15  5:45 AM   Result Value Ref Range   Prothrombin Time 19.0 (H) 11.6 - 15.2 seconds   INR 1.58 (H) 0.00 - 1.49  APTT     Status: None   Collection Time: 01/07/15  5:45 AM  Result Value Ref Range   aPTT 34 24 - 37 seconds  Lipase, blood     Status: None   Collection Time: 01/07/15  5:45 AM  Result Value Ref Range   Lipase 26 11 - 59 U/L  Basic metabolic panel     Status: Abnormal   Collection Time: 01/07/15  5:45 AM  Result Value Ref Range   Sodium 137 135 - 145 mmol/L   Potassium 5.5 (H) 3.5 - 5.1 mmol/L    Comment: NO VISIBLE HEMOLYSIS   Chloride 108 96 - 112 mmol/L   CO2 23 19 - 32 mmol/L   Glucose, Bld 175 (H) 70 - 99 mg/dL   BUN 27 (H) 6 - 23 mg/dL   Creatinine, Ser 1.46 (H) 0.50 - 1.35 mg/dL   Calcium 8.2 (L) 8.4 - 10.5 mg/dL   GFR calc non Af Amer 48 (L) >90 mL/min   GFR calc Af Amer 56 (L) >90 mL/min    Comment: (NOTE) The eGFR has been calculated using the CKD EPI equation. This calculation has not been validated in all clinical situations. eGFR's persistently <90 mL/min signify possible Chronic Kidney Disease.    Anion gap 6 5 - 15  CBC     Status: Abnormal   Collection Time: 01/07/15  5:45 AM  Result Value Ref Range   WBC 2.1 (L) 4.0 - 10.5 K/uL   RBC 2.87 (L) 4.22 - 5.81 MIL/uL   Hemoglobin 8.5 (L) 13.0 - 17.0 g/dL   HCT 26.3 (L) 39.0 - 52.0 %   MCV 91.6 78.0 - 100.0 fL   MCH 29.6 26.0 - 34.0 pg   MCHC 32.3 30.0 - 36.0 g/dL   RDW 15.6 (H) 11.5 - 15.5 %   Platelets 73 (L) 150 - 400 K/uL    Comment: REPEATED TO VERIFY SPECIMEN CHECKED FOR CLOTS CONSISTENT WITH PREVIOUS RESULT     Dg Chest 1  View  01/07/2015   CLINICAL DATA:  Weakness today. Fall. Tripped over car present. Possible surgery to repair hip fracture.  EXAM: CHEST - 1 VIEW  COMPARISON:  01/04/2014  FINDINGS: Shallow inspiration. Mild cardiac enlargement and pulmonary vascular prominence, some of which may be due to shallow inspiration. No focal airspace disease to suggest edema or consolidation. No blunting  of costophrenic angles. No pneumothorax.  IMPRESSION: Cardiac enlargement with mild pulmonary vascular prominence. No evidence of edema or consolidation.   Electronically Signed   By: Lucienne Capers M.D.   On: 01/07/2015 00:17   Ct Head Wo Contrast  01/07/2015   CLINICAL DATA:  Patient fell today on the left side, striking a hardwood floor. No loss of consciousness.  EXAM: CT HEAD WITHOUT CONTRAST  TECHNIQUE: Contiguous axial images were obtained from the base of the skull through the vertex without intravenous contrast.  COMPARISON:  10/27/2013  FINDINGS: Mild cerebral atrophy. No ventricular dilatation. No mass effect or midline shift. No abnormal extra-axial fluid collections. Gray-white matter junctions are distinct. Basal cisterns are not effaced. No evidence of acute intracranial hemorrhage. No depressed skull fractures. Opacification of a left ethmoid air cell. Paranasal sinuses and mastoid air cells are otherwise clear.  IMPRESSION: No acute intracranial abnormalities.  Mild chronic atrophy.   Electronically Signed   By: Lucienne Capers M.D.   On: 01/07/2015 00:27   Dg Hip Unilat With Pelvis 2-3 Views Left  01/06/2015   CLINICAL DATA:  Trip and fall injury with left hip pain. Unable to ambulate.  EXAM: DG HIP W/ PELVIS 2-3V*L*  COMPARISON:  None.  FINDINGS: Comminuted inter trochanteric fractures of the left hip with superior displacement of the distal fracture fragment resulting in varus angulation. Mild displacement of greater and lesser trochanteric fragments. Impaction of fracture fragments. No dislocation at the hip joint. Pelvis and right hip appear intact. Vascular calcifications.  IMPRESSION: Comminuted and impacted acute posttraumatic fractures of the inter trochanteric left hip with varus angulation.   Electronically Signed   By: Lucienne Capers M.D.   On: 01/06/2015 23:34    ROS Blood pressure 113/53, pulse 65, temperature 97.5 F (36.4 C), temperature source Oral, resp. rate 20,  height 5' 6"  (1.676 m), weight 86.7 kg (191 lb 2.2 oz), SpO2 98 %. Physical Exam  Musculoskeletal:       Left hip: He exhibits decreased range of motion, decreased strength, bony tenderness and deformity.  The skin over his left hip is intact.  Assessment/Plan: Left hip displaced intertrochanteric fracture 1)  I have spoke to Mr. Norland and he understands that he has a left hip fracture that will require surgery.  I explained to him in detail the risks and benefits involved.  Will proceed to the OR this evening.  Belina Mandile Y 01/07/2015, 7:16 AM

## 2015-01-07 NOTE — Anesthesia Procedure Notes (Signed)
Procedure Name: Intubation Date/Time: 01/07/2015 10:38 PM Performed by: Leroy LibmanEARDON, Marciana Uplinger L Patient Re-evaluated:Patient Re-evaluated prior to inductionOxygen Delivery Method: Circle system utilized Preoxygenation: Pre-oxygenation with 100% oxygen Intubation Type: IV induction Ventilation: Mask ventilation without difficulty Laryngoscope Size: Miller and 3 Grade View: Grade I Tube type: Oral Tube size: 8.0 mm Number of attempts: 1 Airway Equipment and Method: Stylet Placement Confirmation: ETT inserted through vocal cords under direct vision,  breath sounds checked- equal and bilateral and positive ETCO2 Secured at: 21 cm Tube secured with: Tape Dental Injury: Teeth and Oropharynx as per pre-operative assessment

## 2015-01-07 NOTE — ED Notes (Signed)
Patient does not want foley placed at this time. Per Dr. Rhunette CroftNanavati foley can wait until Patient reaches the floor or the operating room.

## 2015-01-07 NOTE — Progress Notes (Signed)
Pts Sister, [name] is pts power of attourney. Her mobile number is [phone]. [facility] [place]

## 2015-01-07 NOTE — Brief Op Note (Signed)
01/06/2015 - 01/07/2015  11:31 PM  PATIENT:  Cher NakaiMichael L Holsomback  69 y.o. male  PRE-OPERATIVE DIAGNOSIS:  Left Intertrochanteric Fracture  POST-OPERATIVE DIAGNOSIS:  Left Intertrochanteric Fracture  PROCEDURE:  Procedure(s): INTRAMEDULLARY (IM) NAIL INTERTROCHANTRIC (Left)  SURGEON:  Surgeon(s) and Role:    * Kathryne Hitchhristopher Y Sereena Marando, MD - Primary  PHYSICIAN ASSISTANT: Rexene EdisonGil Clark, PA-C  ANESTHESIA:   general  EBL:  Total I/O In: 100 [I.V.:100] Out: 200 [Urine:200]  BLOOD ADMINISTERED:none  DRAINS: none   LOCAL MEDICATIONS USED:  NONE  SPECIMEN:  No Specimen  DISPOSITION OF SPECIMEN:  N/A  COUNTS:  YES  TOURNIQUET:  * No tourniquets in log *  DICTATION: .Other Dictation: Dictation Number [id number]  PLAN OF CARE: Admit to inpatient   PATIENT DISPOSITION:  PACU - hemodynamically stable.   Delay start of Pharmacological VTE agent (>24hrs) due to surgical blood loss or risk of bleeding: no

## 2015-01-07 NOTE — Progress Notes (Signed)
Utilization review completed.  

## 2015-01-07 NOTE — Anesthesia Preprocedure Evaluation (Addendum)
Anesthesia Evaluation  Patient identified by MRN, date of birth, ID band  Reviewed: Allergy & Precautions, NPO status , Patient's Chart, lab work & pertinent test results  History of Anesthesia Complications Negative for: history of anesthetic complications  Airway Mallampati: III  TM Distance: >3 FB Neck ROM: Full    Dental  (+) Teeth Intact, Edentulous Upper   Pulmonary neg shortness of breath, neg sleep apnea, neg COPDneg recent URI, former smoker,  breath sounds clear to auscultation        Cardiovascular hypertension, Pt. on medications - angina+ Past MI - CHF + Valvular Problems/Murmurs Rhythm:Regular + Systolic murmurs    Neuro/Psych PSYCHIATRIC DISORDERS Anxiety Depression negative neurological ROS     GI/Hepatic GERD-  Medicated and Controlled,(+) Cirrhosis -      ,   Endo/Other  diabetes, Type 2, Insulin DependentHypothyroidism   Renal/GU Renal InsufficiencyRenal disease     Musculoskeletal   Abdominal   Peds  Hematology  (+) anemia ,   Anesthesia Other Findings   Reproductive/Obstetrics                            Anesthesia Physical Anesthesia Plan  ASA: III  Anesthesia Plan: General   Post-op Pain Management:    Induction: Intravenous  Airway Management Planned: Oral ETT  Additional Equipment: None  Intra-op Plan:   Post-operative Plan: Extubation in OR  Informed Consent: I have reviewed the patients History and Physical, chart, labs and discussed the procedure including the risks, benefits and alternatives for the proposed anesthesia with the patient or authorized representative who has indicated his/her understanding and acceptance.   Dental advisory given  Plan Discussed with: Surgeon and CRNA  Anesthesia Plan Comments:         Anesthesia Quick Evaluation

## 2015-01-07 NOTE — Progress Notes (Signed)
Patient admitted after midnight- please see h&P.  For surgery this AM.  Repeat BMP at 4 PM for hyperkalemia  Marlin CanaryJessica Haiden Rawlinson DO

## 2015-01-07 NOTE — Progress Notes (Signed)
Clinical Social Work Department CLINICAL SOCIAL WORK PLACEMENT NOTE 01/07/2015  Patient:  [name],[name]  Account Number:  [id number] Admit date:  01/06/2015  Clinical Social Worker:  Cori RazorJAMIE Yesli Vanderhoff, LCSW  Date/time:  01/07/2015 04:39 PM  Clinical Social Work is seeking post-discharge placement for this patient at the following level of care:   SKILLED NURSING   (*CSW will update this form in Epic as items are completed)   01/07/2015  Patient/family provided with [facility] Department of Clinical Social Work's list of facilities offering this level of care within the geographic area requested by the patient (or if unable, by the patient's family).  01/07/2015  Patient/family informed of their freedom to choose among providers that offer the needed level of care, that participate in Medicare, Medicaid or managed care program needed by the patient, have an available bed and are willing to accept the patient.    Patient/family informed of [facility]' ownership interest in [facility], as well as of the fact that they are under no obligation to receive care at this facility.  PASARR submitted to EDS on 01/07/2015 PASARR number received on   FL2 transmitted to all facilities in geographic area requested by pt/family on  01/07/2015 FL2 transmitted to all facilities within larger geographic area on   Patient informed that his/her managed care company has contracts with or will negotiate with  certain facilities, including the following:     Patient/family informed of bed offers received:   Patient chooses bed at  Physician recommends and patient chooses bed at    Patient to be transferred to  on   Patient to be transferred to facility by  Patient and family notified of transfer on  Name of family member notified:    The following physician request were entered in Epic:   Additional Comments:  Cori RazorJamie Tarus Briski LCSW [phone]

## 2015-01-07 NOTE — ED Notes (Signed)
ED staff assisted ortho tech with moving patient to hospital bed and applying traction.

## 2015-01-08 ENCOUNTER — Encounter (HOSPITAL_COMMUNITY): Payer: Self-pay | Admitting: Orthopaedic Surgery

## 2015-01-08 DIAGNOSIS — E038 Other specified hypothyroidism: Secondary | ICD-10-CM

## 2015-01-08 DIAGNOSIS — S72002D Fracture of unspecified part of neck of left femur, subsequent encounter for closed fracture with routine healing: Secondary | ICD-10-CM

## 2015-01-08 DIAGNOSIS — F419 Anxiety disorder, unspecified: Secondary | ICD-10-CM

## 2015-01-08 LAB — BASIC METABOLIC PANEL
Anion gap: 7 (ref 5–15)
Anion gap: 4 — ABNORMAL LOW (ref 5–15)
BUN: 34 mg/dL — AB (ref 6–23)
BUN: 32 mg/dL — ABNORMAL HIGH (ref 6–23)
CALCIUM: 7.7 mg/dL — AB (ref 8.4–10.5)
CO2: 20 mmol/L (ref 19–32)
CO2: 22 mmol/L (ref 19–32)
Calcium: 7.7 mg/dL — ABNORMAL LOW (ref 8.4–10.5)
Chloride: 110 mmol/L (ref 96–112)
Chloride: 112 mmol/L (ref 96–112)
Creatinine, Ser: 1.42 mg/dL — ABNORMAL HIGH (ref 0.50–1.35)
Creatinine, Ser: 1.42 mg/dL — ABNORMAL HIGH (ref 0.50–1.35)
GFR calc non Af Amer: 50 mL/min — ABNORMAL LOW (ref >90)
GFR, EST AFRICAN AMERICAN: 58 mL/min — AB (ref >90)
GFR, EST AFRICAN AMERICAN: 58 mL/min — AB (ref >90)
GFR, EST NON AFRICAN AMERICAN: 50 mL/min — AB (ref >90)
GLUCOSE: 268 mg/dL — AB (ref 70–99)
Glucose, Bld: 279 mg/dL — ABNORMAL HIGH (ref 70–99)
Potassium: 5.3 mmol/L — ABNORMAL HIGH (ref 3.5–5.1)
Potassium: 4.9 mmol/L (ref 3.5–5.1)
Sodium: 137 mmol/L (ref 135–145)
Sodium: 138 mmol/L (ref 135–145)

## 2015-01-08 LAB — CBC
HCT: 27.4 % — ABNORMAL LOW (ref 39.0–52.0)
HEMOGLOBIN: 9.1 g/dL — AB (ref 13.0–17.0)
MCH: 30.5 pg (ref 26.0–34.0)
MCHC: 33.2 g/dL (ref 30.0–36.0)
MCV: 91.9 fL (ref 78.0–100.0)
PLATELETS: 53 10*3/uL — AB (ref 150–400)
RBC: 2.98 MIL/uL — ABNORMAL LOW (ref 4.22–5.81)
RDW: 15.5 % (ref 11.5–15.5)
WBC: 2.3 10*3/uL — AB (ref 4.0–10.5)

## 2015-01-08 LAB — GLUCOSE, CAPILLARY
GLUCOSE-CAPILLARY: 178 mg/dL — AB (ref 70–99)
GLUCOSE-CAPILLARY: 236 mg/dL — AB (ref 70–99)
GLUCOSE-CAPILLARY: 255 mg/dL — AB (ref 70–99)
Glucose-Capillary: 244 mg/dL — ABNORMAL HIGH (ref 70–99)
Glucose-Capillary: 160 mg/dL — ABNORMAL HIGH (ref 70–99)
Glucose-Capillary: 182 mg/dL — ABNORMAL HIGH (ref 70–99)

## 2015-01-08 LAB — CLOSTRIDIUM DIFFICILE BY PCR: Toxigenic C. Difficile by PCR: NEGATIVE

## 2015-01-08 MED ORDER — MUPIROCIN 2 % EX OINT
1.0000 "application " | TOPICAL_OINTMENT | Freq: Two times a day (BID) | CUTANEOUS | Status: DC
  Administered 2015-01-08 – 2015-01-10 (×5): 1 via NASAL
  Filled 2015-01-08: qty 22

## 2015-01-08 MED ORDER — METOCLOPRAMIDE HCL 5 MG/ML IJ SOLN: 5.0000 mg | Freq: Three times a day (TID) | INTRAMUSCULAR | Status: DC | PRN

## 2015-01-08 MED ORDER — ONDANSETRON HCL 4 MG PO TABS: 4.0000 mg | ORAL_TABLET | Freq: Four times a day (QID) | ORAL | Status: DC | PRN

## 2015-01-08 MED ORDER — ONDANSETRON HCL 4 MG/2ML IJ SOLN: 4.0000 mg | Freq: Four times a day (QID) | INTRAMUSCULAR | Status: DC | PRN

## 2015-01-08 MED ORDER — METOCLOPRAMIDE HCL 10 MG PO TABS: 5.0000 mg | ORAL_TABLET | Freq: Three times a day (TID) | ORAL | Status: DC | PRN

## 2015-01-08 MED ORDER — PHENOL 1.4 % MT LIQD
1.0000 | OROMUCOSAL | Status: DC | PRN
  Filled 2015-01-08: qty 177

## 2015-01-08 MED ORDER — MENTHOL 3 MG MT LOZG
1.0000 | LOZENGE | OROMUCOSAL | Status: DC | PRN
  Filled 2015-01-08: qty 9

## 2015-01-08 MED ORDER — MORPHINE SULFATE 2 MG/ML IJ SOLN: 0.5000 mg | INTRAMUSCULAR | Status: DC | PRN

## 2015-01-08 MED ORDER — ACETAMINOPHEN 650 MG RE SUPP: 650.0000 mg | Freq: Four times a day (QID) | RECTAL | Status: DC | PRN

## 2015-01-08 MED ORDER — CHLORHEXIDINE GLUCONATE CLOTH 2 % EX PADS
6.0000 | MEDICATED_PAD | Freq: Every day | CUTANEOUS | Status: DC
  Administered 2015-01-08 – 2015-01-09 (×2): 6 via TOPICAL

## 2015-01-08 MED ORDER — ACETAMINOPHEN 325 MG PO TABS: 650.0000 mg | ORAL_TABLET | Freq: Four times a day (QID) | ORAL | Status: DC | PRN

## 2015-01-08 MED ORDER — INSULIN GLARGINE 100 UNIT/ML ~~LOC~~ SOLN
30.0000 [IU] | Freq: Two times a day (BID) | SUBCUTANEOUS | Status: DC
  Administered 2015-01-08 – 2015-01-10 (×5): 30 [IU] via SUBCUTANEOUS
  Filled 2015-01-08 (×6): qty 0.3

## 2015-01-08 MED ORDER — HYDROCODONE-ACETAMINOPHEN 5-325 MG PO TABS
1.0000 | ORAL_TABLET | Freq: Four times a day (QID) | ORAL | Status: DC | PRN
  Administered 2015-01-08 – 2015-01-10 (×6): 2 via ORAL
  Filled 2015-01-08 (×6): qty 2

## 2015-01-08 MED ORDER — CLINDAMYCIN PHOSPHATE 600 MG/50ML IV SOLN
600.0000 mg | Freq: Four times a day (QID) | INTRAVENOUS | Status: AC
  Administered 2015-01-08 (×2): 600 mg via INTRAVENOUS
  Filled 2015-01-08 (×2): qty 50

## 2015-01-08 MED ORDER — ASPIRIN EC 325 MG PO TBEC
325.0000 mg | DELAYED_RELEASE_TABLET | Freq: Every day | ORAL | Status: DC
  Administered 2015-01-08 – 2015-01-10 (×3): 325 mg via ORAL
  Filled 2015-01-08 (×4): qty 1

## 2015-01-08 NOTE — Care Management Note (Signed)
    Page 1 of 1   01/08/2015     2:30:57 PM CARE MANAGEMENT NOTE 01/08/2015  Patient:  Cher NakaiSHORT,Cosby L   Account Number:  [id number]  Date Initiated:  01/08/2015  Documentation initiated by:  [facility] Center[facility]FFRIES,Frantz Quattrone  Subjective/Objective Assessment:   adm: Left displaced hip intertrochanteric fracture.     PROCEDURE:  Open reduction and internal fixation of left hip     Action/Plan:   discharge planning   Anticipated DC Date:  01/09/2015   Anticipated DC Plan:  SKILLED NURSING FACILITY         Choice offered to / List presented to:             Status of service:  Completed, signed off Medicare Important Message given?   (If response is "NO", the following Medicare IM given date fields will be blank) Date Medicare IM given:   Medicare IM given by:   Date Additional Medicare IM given:   Additional Medicare IM given by:    Discharge Disposition:  SKILLED NURSING FACILITY  Per UR Regulation:    If discussed at Long Length of Stay Meetings, dates discussed:    Comments:  01/08/15 14:25 CM notes pt to go to SNF; CSW arranging.  No other CM needs were communicated.  Freddy JakschSarah Bana Borgmeyer, BSN, Cm [phone].

## 2015-01-08 NOTE — Progress Notes (Signed)
Subjective: 1 Day Post-Op Procedure(s) (LRB): INTRAMEDULLARY (IM) NAIL INTERTROCHANTRIC (Left) Patient reports pain as moderate.  Alert and awake. Objective: Vital signs in last 24 hours: Temp:  [98.2 F (36.8 C)-100 F (37.8 C)] 98.8 F (37.1 C) (01/27 0425) Pulse Rate:  [68-77] 74 (01/27 0425) Resp:  [15-20] 20 (01/27 0425) BP: (91-147)/(46-77) 140/54 mmHg (01/27 0425) SpO2:  [90 %-98 %] 93 % (01/27 0425)  Intake/Output from previous day: 01/26 0701 - 01/27 0700 In: 2235 [I.V.:1900; Blood:335] Out: 1225 [Urine:1025; Blood:200] Intake/Output this shift:     Recent Labs  01/07/15 0015 01/07/15 0545 01/08/15 0459  HGB 8.9* 8.5* 9.1*    Recent Labs  01/07/15 0545 01/08/15 0459  WBC 2.1* 2.3*  RBC 2.87* 2.98*  HCT 26.3* 27.4*  PLT 73* 53*    Recent Labs  01/07/15 1628 01/08/15 0459  NA 137 137  K 5.1 5.3*  CL 109 110  CO2 23 20  BUN 30* 34*  CREATININE 1.49* 1.42*  GLUCOSE 168* 279*  CALCIUM 8.2* 7.7*    Recent Labs  01/07/15 0015 01/07/15 0545  INR 1.50* 1.58*   Left leg: Intact pulses distally Dorsiflexion/Plantar flexion intact Incision: scant drainage Compartment soft  follows commands   Assessment/Plan: 1 Day Post-Op Procedure(s) (LRB): INTRAMEDULLARY (IM) NAIL INTERTROCHANTRIC (Left) Up with therapy  Acute on chronic anemia s/p 2 units of PRBC's, Pancytopenia monitor for symptoms of anemia Hyperkalemia   [name] 01/08/2015, 8:03 AM

## 2015-01-08 NOTE — Progress Notes (Signed)
Pt / [facility] [place]. Bed will be available 1/28 if pt is stable for d/c. CSW will continue to follow to assist with d/c planning to SNF.  Cori RazorJamie Sander Remedios LCSW [phone]

## 2015-01-08 NOTE — Progress Notes (Signed)
OT Cancellation Note  Patient Details Name: [name] MRN: [id number] DOB: 17-Oct-1946   Cancelled Treatment:    Reason Eval/Treat Not Completed: Other (comment).  Noted plan is for SNF for rehab.  Will defer OT evaluation to that venue.  [name] 01/08/2015, 10:55 AM  Marica OtterMaryellen Shariece Viveiros, OTR/L [phone] 01/08/2015

## 2015-01-08 NOTE — Progress Notes (Signed)
[facility] Progress Note   [name] NakaiMichael L Perezperez ZOX:[id number] DOB: April 06, 1946 DOA: 01/06/2015 PCP: Florentina JennyRIPP, HENRY, MD  Brief narrative: [name] is a 69 y.o. male with past medical history of hypertension hyperlipidemia gastroesophageal reflux disease, diabetes mellitus, CK D3, alcoholic cirrhosis who presented after a fall and is found to have a left hip fracture.   Subjective: Complains of pain in the left hip mostly when he moves. He also initially had nausea vomiting and diarrhea but he states this has resolved. He is able to take clear liquids well.  Assessment/Plan: Principal Problem:   Hip fracture, left - Ortho has consulted- plan to go to the OR this evening  Active Problems:   Nausea vomiting and diarrhea - This has resolved- CT of abd/pelvis unrevealing  Hyperkalemia - resolved    Depression/Anxiety - Continue Wellbutrin, Prozac and trazodone  CKD 3 - stable    GERD (gastroesophageal reflux disease) -Continue Protonix    Hyperlipidemia - Continue simvastatin and fenofibrate    DM (diabetes mellitus) - Continue Lantus and NovoLog but dose adjusted as he is nothing by mouth    Other pancytopenia - Patient workup underway    Hypothyroidism - Continue Synthroid    Code Status: full code Family Communication:  Disposition Plan: to be determined with PT eval DVT prophylaxis: per ortho  Consultants: ortho  Procedures:   Antibiotics: Anti-infectives    Start     Dose/Rate Route Frequency Ordered Stop   01/08/15 0500  clindamycin (CLEOCIN) IVPB 600 mg     600 mg100 mL/hr over 30 Minutes Intravenous Every 6 hours 01/08/15 0135 01/08/15 1100   01/07/15 2215  clindamycin (CLEOCIN) IVPB 900 mg     900 mg100 mL/hr over 30 Minutes Intravenous  Once 01/07/15 2212 01/07/15 2325         Objective: Filed Weights   01/07/15 0315  Weight: 86.7 kg (191 lb 2.2 oz)    Intake/Output Summary (Last 24 hours) at 01/08/15 1516 Last data filed at  01/08/15 1030  Gross per 24 hour  Intake   5730 ml  Output   1475 ml  Net   4255 ml     Vitals Filed Vitals:   01/08/15 0800 01/08/15 1020 01/08/15 1130 01/08/15 1432  BP:  129/52  112/50  Pulse:  78  72  Temp:  98.3 F (36.8 C)  99.7 F (37.6 C)  TempSrc:  Oral  Oral  Resp: 20 18 18 18   Height:      Weight:      SpO2: 96% 98% 98% 100%    Exam: General: No acute respiratory distress Lungs: Clear to auscultation bilaterally without wheezes or crackles Cardiovascular: Regular rate and rhythm without murmur gallop or rub normal S1 and S2 Abdomen: Nontender, nondistended, soft, bowel sounds positive, no rebound, no ascites, no appreciable mass Extremities: No significant cyanosis, clubbing, or edema bilateral lower extremities  Data Reviewed: Basic Metabolic Panel:  Recent Labs Lab 01/07/15 0015 01/07/15 0545 01/07/15 1628 01/08/15 0459 01/08/15 1248  NA 141 137 137 137 138  K 5.1 5.5* 5.1 5.3* 4.9  CL 109 108 109 110 112  CO2 24 23 23 20 22   GLUCOSE 159* 175* 168* 279* 268*  BUN 23 27* 30* 34* 32*  CREATININE 1.40* 1.46* 1.49* 1.42* 1.42*  CALCIUM 9.0 8.2* 8.2* 7.7* 7.7*   Liver Function Tests: No results for input(s): AST, ALT, ALKPHOS, BILITOT, PROT, ALBUMIN in the last 168 hours.  Recent Labs Lab 01/07/15 0545  LIPASE  26   No results for input(s): AMMONIA in the last 168 hours. CBC:  Recent Labs Lab 01/07/15 0015 01/07/15 0545 01/08/15 0459  WBC 1.9* 2.1* 2.3*  NEUTROABS 1.5*  --   --   HGB 8.9* 8.5* 9.1*  HCT 27.7* 26.3* 27.4*  MCV 91.1 91.6 91.9  PLT 72* 73* 53*   Cardiac Enzymes: No results for input(s): CKTOTAL, CKMB, CKMBINDEX, TROPONINI in the last 168 hours. BNP (last 3 results) No results for input(s): PROBNP in the last 8760 hours. CBG:  Recent Labs Lab 01/07/15 2012 01/07/15 2359 01/08/15 0424 01/08/15 0814 01/08/15 1204  GLUCAP 157* 178* 244* 236* 255*    Recent Results (from the past 240 hour(s))  Surgical pcr screen      Status: Abnormal   Collection Time: 01/07/15  7:44 PM  Result Value Ref Range Status   MRSA, PCR POSITIVE (A) NEGATIVE Final    Comment: RESULT CALLED TO, READ BACK BY AND VERIFIED WITH: CLARK,J/6E  ON 01/07/15 BY KARCZEWSKI,S.    Staphylococcus aureus POSITIVE (A) NEGATIVE Final    Comment:        The Xpert SA Assay (FDA approved for NASAL specimens in patients over 4 years of age), is one component of a comprehensive surveillance program.  Test performance has been validated by [facility] for patients greater than or equal to 24 year old. It is not intended to diagnose infection nor to guide or monitor treatment.      Studies:  Recent x-ray studies have been reviewed in detail by the Attending Physician  Scheduled Meds:  Scheduled Meds: . sodium chloride   Intravenous Once  . aspirin EC  325 mg Oral Q breakfast  . buPROPion  150 mg Oral q morning - 10a  . Chlorhexidine Gluconate Cloth  6 each Topical Q0600  . cholecalciferol  1,000 Units Oral q morning - 10a  . fenofibrate  160 mg Oral q morning - 10a  . ferrous sulfate  325 mg Oral Q breakfast  . FLUoxetine  60 mg Oral Daily  . insulin aspart  0-9 Units Subcutaneous 6 times per day  . insulin glargine  30 Units Subcutaneous BID  . lamoTRIgine  50 mg Oral BID  . levothyroxine  25 mcg Oral QAC breakfast  . meclizine  25 mg Oral Daily  . multivitamin with minerals  1 tablet Oral q morning - 10a  . mupirocin ointment  1 application Nasal BID  . pantoprazole  40 mg Oral Daily  . simvastatin  40 mg Oral QHS  . tamsulosin  0.4 mg Oral Daily  . traZODone  75 mg Oral QHS   Continuous Infusions: . sodium chloride 1,000 mL (01/08/15 1416)    Time spent on care of this patient: 35 min   Germany Chelf, MD 01/08/2015, 3:16 PM  LOS: 2 days   [facility] Office  [phone] Pager - Text Page per [email or web address]  If 7PM-7AM, please contact night-coverage [email or web address]

## 2015-01-08 NOTE — Transfer of Care (Signed)
Immediate Anesthesia Transfer of Care Note  Patient: [name]  Procedure(s) Performed: Procedure(s): INTRAMEDULLARY (IM) NAIL INTERTROCHANTRIC (Left)  Patient Location: PACU  Anesthesia Type:General  Level of Consciousness: awake, patient cooperative and responds to stimulation  Airway & Oxygen Therapy: Patient Spontanous Breathing and Patient connected to face mask oxygen  Post-op Assessment: Report given to PACU RN, Post -op Vital signs reviewed and stable and Patient moving all extremities X 4  Post vital signs: stable  Complications: No apparent anesthesia complications

## 2015-01-08 NOTE — Op Note (Signed)
NAMJene Every:  Lincoln, Linell               ACCOUNT NO.:  [id number]  MEDICAL RECORD NO.:  [id number]  LOCATION:  1604                         [facility]  PHYSICIAN:  Vanita PandaChristopher Y. Magnus IvanBlackman, M.D.DATE OF BIRTH:  [birth year]  DATE OF PROCEDURE:  01/07/2015 DATE OF DISCHARGE:                              OPERATIVE REPORT   PREOPERATIVE DIAGNOSIS:  Left displaced hip intertrochanteric fracture.  POSTOPERATIVE DIAGNOSIS:  Left displaced hip intertrochanteric fracture.  PROCEDURE:  Open reduction and internal fixation of left hip intertrochanteric fracture using intramedullary hip screw.  IMPLANTS:  Smith and Nephew 10 x 38 intramedullary nail with 85-mm lag screw and compression components.  SURGEON:  Vanita PandaChristopher Y. Magnus IvanBlackman, M.D.  ASSISTANT:  Richardean CanalGilbert Clark, PA-C  ANESTHESIA:  General.  ANTIBIOTICS:  900 mg of IV clindamycin.  BLOOD LOSS:  100-200 mL.  COMPLICATIONS:  None.  INDICATIONS:  Mr. [name] is a 69 year old gentleman with multiple medical problems who sustained a mechanical fall last evening.  He injured his left hip and [facility] and found to have a left displaced intertrochanteric hip fracture.  He was graciously admitted to the Medicine Service and optimize for surgery.  He understands fully the risks and benefits of the surgery given his age and comorbidities.  He understands the goals of surgery, decreased pain, improved mobility and improved quality of life.  PROCEDURE DESCRIPTION:  After informed consent was obtained, appropriate left hip was marked.  He was brought to the operating room and general anesthesia was taken while he was on a stretcher.  Next, he was placed supine on the fracture table with the perineal post in place.  His left operative leg in in-line skeletal traction with traction and internal rotation.  The right hip was then flexed and externally rotated and abducted, and placed in a stirrup with appropriate  padding.  We then assessed his fracture under direct fluoroscopy and we were able to reduce it adequately.  We then chose our Smith and Nephew 10 x 38 femoral nail based on lying this sterile box on top of his leg, keeping the leg sterile for judging our diameter and length.  We then passed this off to the back table sterilely, so we could be taken out of the box sterilely.  We then prepped the left hip with DuraPrep and sterile drapes.  A time-out was called and he was identified as correct patient and correct left hip.  We then made an incision just proximal to the greater trochanter and dissected down the tip of greater trochanter.  I then placed a temporary guidepin in an antegrade fashion from the tip of the greater trochanter down the lesser trochanter.  I used an initiating reamer to open the femoral canal and then placed a guide rod without difficulty and without needing to ream down the femoral canal.  Using the outrigger guide, we made a separate incision on the lateral aspect of the femur and placed a guidepin traversing the lateral cortex of the femur, the femoral neck and the fracture into a center-center position of the femoral head.  We took a measurement and drilled for the depth of 85-mm lag screw.  We placed that lag screw without difficulty.  We then let some traction off the bed and placed a compression component as well.  We then removed the outrigger guide and copiously irrigated the soft tissue with normal saline solution.  We closed the deep tissue with 0 Vicryl followed by 2-0 Vicryl in subcutaneous tissue and staples on the skin.  We took him off the fracture table, awakened, extubated and taken to the recovery room in stable condition.  All final counts were correct.  There were no complications noted.  Of note, Richardean Canal, PA- C assisted during the entire case and his assistance was crucial for facilitating all aspects of this case.     Vanita Panda.  Magnus Ivan, M.D.     CYB/MEDQ  D:  01/07/2015  T:  01/08/2015  Job:  [id number]

## 2015-01-08 NOTE — Progress Notes (Addendum)
Inpatient Diabetes Program Recommendations  AACE/ADA: New Consensus Statement on Inpatient Glycemic Control (2013)  Target Ranges:  Prepandial:   less than 140 mg/dL      Peak postprandial:   less than 180 mg/dL (1-2 hours)      Critically ill patients:  140 - 180 mg/dL   Results for [name] NakaiSHORT, [name] (MRN [id number]) as of 01/08/2015 10:09  Ref. Range 01/07/2015 08:42 01/07/2015 10:47 01/07/2015 17:13 01/07/2015 20:12 01/07/2015 23:59 01/08/2015 04:24 01/08/2015 08:14  Glucose-Capillary Latest Range: 70-99 mg/dL 299147 (H) 371152 (H) 696155 (H) 157 (H) 178 (H) 244 (H) 236 (H)    Diabetes history: DM 2 Outpatient Diabetes medications: Lantus 36 units BID, Novolog 25-30 units TID with meals (25 units breakfast, 30 units lunch, 30 units supper) Current orders for Inpatient glycemic control: Lantus 30 units BID, Novolog 0-9 units Q6hrs  Inpatient Diabetes Program Recommendations Insulin - Basal: If patients fasting CBG is greater than 180 mg/dl tomorrow 7/891/28. Please consider increasing Lantus to 36 units BID. Correction (SSI): Please consider increasing correction scale to Novolog 0-15 units TID with meals. Please change CBG's to ACHS. Insulin - Meal Coverage: Please consider ordering Novolog 5 units TID with clear liquid diet for meal coverage. HgbA1C: 6.3% on 1/26. Good control at home. Diet: When appropriate please order diet as Carb Mod Med.  Thanks,  Christena DeemShannon Larissa Pegg RN, MSN, [facility] Inpatient Diabetes Coordinator Team Pager [phone]

## 2015-01-08 NOTE — Evaluation (Signed)
Physical Therapy Evaluation Patient Details Name: [name] MRN: [id number] DOB: 11-Jun-1946 Today's Date: 01/08/2015   History of Present Illness  L hip fx s/p IM nailing  Clinical Impression  Pt s/p L hip fx presents with decreased L LE strength/ROM, post op pain and premorbid deconditioning and cognitive status limiting functional mobility.  Pt will benefit from follow up rehab at SNF level.    Follow Up Recommendations SNF    Equipment Recommendations  None recommended by PT    Recommendations for Other Services       Precautions / Restrictions Precautions Precautions: Fall Restrictions Weight Bearing Restrictions: No LLE Weight Bearing: Weight bearing as tolerated      Mobility  Bed Mobility Overal bed mobility: Needs Assistance;+2 for physical assistance Bed Mobility: Supine to Sit;Sit to Supine     Supine to sit: +2 for physical assistance;Total assist Sit to supine: +2 for physical assistance;Total assist   General bed mobility comments: utilized pad to transition pt to EOB.  Total assist to attain sitting but pt able to balance at bedside with Bil UE support and min assist x 8 min  Transfers                 General transfer comment: NT 2* pts limited ability to follow cues, shaking all 4 limbs  and total assist to EOB.  Pt   Ambulation/Gait                Stairs            Wheelchair Mobility    Modified Rankin (Stroke Patients Only)       Balance                                             Pertinent Vitals/Pain Pain Assessment: Faces Faces Pain Scale: Hurts little more Pain Location: L hip Pain Intervention(s): Limited activity within patient's tolerance;Monitored during session;Premedicated before session    Home Living Family/patient expects to be discharged to:: Skilled nursing facility                      Prior Function Level of Independence: Needs assistance         Comments: Pt  unable to supply information.  Per RN, sister reports pt requires assist for feeding 2* shaking     Hand Dominance        Extremity/Trunk Assessment   Upper Extremity Assessment: Difficult to assess due to impaired cognition (Pt very ltd in ability to follow cues)           Lower Extremity Assessment: Difficult to assess due to impaired cognition      Cervical / Trunk Assessment: Normal  Communication   Communication: No difficulties  Cognition Arousal/Alertness: Lethargic;Suspect due to medications Behavior During Therapy: Flat affect Overall Cognitive Status: History of cognitive impairments - at baseline                      General Comments      Exercises General Exercises - Lower Extremity Ankle Circles/Pumps: AAROM;Both;10 reps;Supine Heel Slides: AAROM;PROM;Left;10 reps;Supine Hip ABduction/ADduction: AAROM;PROM;Left;10 reps;Supine      Assessment/Plan    PT Assessment Patient needs continued PT services  PT Diagnosis Difficulty walking   PT Problem List Decreased strength;Decreased range of motion;Decreased activity tolerance;Decreased balance;Decreased mobility;Decreased cognition;Decreased knowledge of  use of DME;Decreased safety awareness;Obesity;Pain  PT Treatment Interventions Gait training;DME instruction;Functional mobility training;Therapeutic activities;Therapeutic exercise;Patient/family education   PT Goals (Current goals can be found in the Care Plan section) Acute Rehab PT Goals Patient Stated Goal: lay back down PT Goal Formulation: Patient unable to participate in goal setting Time For Goal Achievement: 01/15/15 Potential to Achieve Goals: Fair    Frequency Min 3X/week   Barriers to discharge        Co-evaluation               End of Session Equipment Utilized During Treatment: Gait belt Activity Tolerance: Patient limited by fatigue Patient left: in bed;with call bell/phone within reach Nurse Communication: Mobility  status         Time: 0932-1002 PT Time Calculation (min) (ACUTE ONLY): 30 min   Charges:   PT Evaluation $Initial PT Evaluation Tier I: 1 Procedure PT Treatments $Therapeutic Exercise: 8-22 mins $Therapeutic Activity: 8-22 mins   PT G Codes:        Jorene Kaylor 01/08/2015, 12:18 PM

## 2015-01-08 NOTE — Progress Notes (Signed)
Patient arrived from PACU at approximately 0115. Patient is drowsy but easily aroused. Left hip with Mepilex dressing in place with small stain mark noted. Area was marked. CMS within normal limits. Ice in place to left hip. Drank approximately 100cc of ginger ale without any complaints of nausea and /or vomiting. Placed on orange contact for positive MRSA PCR. No complaints of pain voiced at this time. Will continue to monitor.

## 2015-01-09 DIAGNOSIS — S72002S Fracture of unspecified part of neck of left femur, sequela: Secondary | ICD-10-CM

## 2015-01-09 LAB — CBC
HEMATOCRIT: 24.7 % — AB (ref 39.0–52.0)
Hemoglobin: 8.1 g/dL — ABNORMAL LOW (ref 13.0–17.0)
MCH: 29.9 pg (ref 26.0–34.0)
MCHC: 32.8 g/dL (ref 30.0–36.0)
MCV: 91.1 fL (ref 78.0–100.0)
PLATELETS: 63 10*3/uL — AB (ref 150–400)
RBC: 2.71 MIL/uL — ABNORMAL LOW (ref 4.22–5.81)
RDW: 15.8 % — ABNORMAL HIGH (ref 11.5–15.5)
WBC: 2.6 10*3/uL — AB (ref 4.0–10.5)

## 2015-01-09 LAB — BASIC METABOLIC PANEL
Anion gap: 5 (ref 5–15)
BUN: 43 mg/dL — ABNORMAL HIGH (ref 6–23)
CO2: 22 mmol/L (ref 19–32)
Calcium: 7.7 mg/dL — ABNORMAL LOW (ref 8.4–10.5)
Chloride: 113 mmol/L — ABNORMAL HIGH (ref 96–112)
Creatinine, Ser: 1.27 mg/dL (ref 0.50–1.35)
GFR calc non Af Amer: 57 mL/min — ABNORMAL LOW (ref >90)
GFR, EST AFRICAN AMERICAN: 66 mL/min — AB (ref >90)
Glucose, Bld: 101 mg/dL — ABNORMAL HIGH (ref 70–99)
POTASSIUM: 4.3 mmol/L (ref 3.5–5.1)
Sodium: 140 mmol/L (ref 135–145)

## 2015-01-09 LAB — GLUCOSE, CAPILLARY
GLUCOSE-CAPILLARY: 101 mg/dL — AB (ref 70–99)
GLUCOSE-CAPILLARY: 130 mg/dL — AB (ref 70–99)
GLUCOSE-CAPILLARY: 176 mg/dL — AB (ref 70–99)
GLUCOSE-CAPILLARY: 209 mg/dL — AB (ref 70–99)
Glucose-Capillary: 106 mg/dL — ABNORMAL HIGH (ref 70–99)
Glucose-Capillary: 252 mg/dL — ABNORMAL HIGH (ref 70–99)

## 2015-01-09 MED ORDER — HYDROCODONE-ACETAMINOPHEN 5-325 MG PO TABS: 1.0000 | ORAL_TABLET | Freq: Four times a day (QID) | ORAL | Status: DC | PRN

## 2015-01-09 MED ORDER — METHOCARBAMOL 500 MG PO TABS: 500.0000 mg | ORAL_TABLET | Freq: Four times a day (QID) | ORAL | Status: DC | PRN

## 2015-01-09 MED ORDER — ASPIRIN 325 MG PO TBEC: 325.0000 mg | DELAYED_RELEASE_TABLET | Freq: Every day | ORAL | Status: DC

## 2015-01-09 NOTE — Discharge Summary (Addendum)
Physician Discharge Summary  [name] ZOX:[id number] DOB: 28-Dec-1945 DOA: 01/06/2015  PCP: Florentina Jenny, MD  Admit date: 01/06/2015 Discharge date: 01/10/2015  Time spent: 55 minutes  Recommendations for Outpatient Follow-up:  1. F/u with Ortho 2. D/c with Foley cath- placed today- Voiding trial in 2-3 days when more ambulatory   Discharge Condition: stable Diet recommendation: heart heathy, diabetic diet  Discharge Diagnoses:  Principal Problem:   Hip fracture, left Active Problems:   Depression   Anxiety   GERD (gastroesophageal reflux disease)   Hyperlipidemia   Hypertension   DM (diabetes mellitus)   Other pancytopenia   Nausea vomiting and diarrhea   Hypothyroidism Urinary retention  History of present illness:  [name] is a 69 y.o. male with past medical history of hypertension hyperlipidemia gastroesophageal reflux disease, diabetes mellitus, CKD3, alcoholic cirrhosis who presented after a fall and is found to have a left hip fracture.  Hospital Course:  Principal Problem: Left hip Intertrochanteric fracture - s/p Intramedullary nail on 1/27- doing well with rehab - plans to d/c to SNF today  Active Problems:  Nausea vomiting and diarrhea - This has resolved- CT of abd/pelvis unrevealing- he is tolerating a regular diet without any issues  Urinary retention On the evening of the 28th, he began to retain urine and an I and O cath was needed- We have now placed a foley cath as he is still unable to void with > 500 cc or urine noted in his bladder on the bladder scanner.   Hyperkalemia - resolved   Depression/Anxiety - Continue Wellbutrin, Prozac and trazodone  CKD 3 - stable- Cr 1.27 today   GERD (gastroesophageal reflux disease) -Continue Protonix   Hyperlipidemia - Continue simvastatin and fenofibrate   DM (diabetes mellitus) - Continue Lantus and NovoLog   Other pancytopenia - workup underway as outpt   Hypothyroidism -  Continue Synthroid   Procedures:  ORIF 1/27  Consultations:  ortho  Discharge Exam: Filed Weights   01/07/15 0315 01/09/15 0118 01/10/15 0539  Weight: 86.7 kg (191 lb 2.2 oz) 85.5 kg (188 lb 7.9 oz) 84.9 kg (187 lb 2.7 oz)   Filed Vitals:   01/10/15 0800  BP:   Pulse:   Temp:   Resp: 16    General: AAO x 3, no distress Cardiovascular: RRR, no murmurs  Respiratory: clear to auscultation bilaterally GI: soft, non-tender, non-distended, bowel sound positive  Discharge Instructions You were cared for by a hospitalist during your hospital stay. If you have any questions about your discharge medications or the care you received while you were in the hospital after you are discharged, you can call the unit and asked to speak with the hospitalist on call if the hospitalist that took care of you is not available. Once you are discharged, your primary care physician will handle any further medical issues. Please note that NO REFILLS for any discharge medications will be authorized once you are discharged, as it is imperative that you return to your primary care physician (or establish a relationship with a primary care physician if you do not have one) for your aftercare needs so that they can reassess your need for medications and monitor your lab values.      Discharge Instructions    Diet - low sodium heart healthy    Complete by:  As directed      Full weight bearing    Complete by:  As directed      Increase activity slowly  Complete by:  As directed             Medication List    TAKE these medications        aspirin 325 MG EC tablet  Take 1 tablet (325 mg total) by mouth daily with breakfast.     BENEFIBER Powd  Take 2 mLs by mouth every morning. Mix in 4 oz of water and drink     buPROPion 150 MG 24 hr tablet  Commonly known as:  WELLBUTRIN XL  Take 150 mg by mouth every morning.     DAILY VITE Tabs  Take 1 tablet by mouth every morning.     fenofibrate  160 MG tablet  Take 160 mg by mouth every morning.     ferrous sulfate 325 (65 FE) MG tablet  Take 325 mg by mouth daily with breakfast.     FLUoxetine 20 MG capsule  Commonly known as:  PROZAC  Take 60 mg by mouth daily.     glucose blood test strip  1 each by Other route 4 (four) times daily -  before meals and at bedtime. Use as instructed     HYDROcodone-acetaminophen 5-325 MG per tablet  Commonly known as:  NORCO/VICODIN  Take 1 tablet by mouth every 6 (six) hours as needed for moderate pain.     insulin aspart 100 UNIT/ML injection  Commonly known as:  novoLOG  Inject 0-9 Units into the skin 3 (three) times daily with meals.     insulin glargine 100 UNIT/ML injection  Commonly known as:  LANTUS  Inject 36 Units into the skin 2 (two) times daily.     lamoTRIgine 25 MG tablet  Commonly known as:  LAMICTAL  Take 50 mg by mouth 2 (two) times daily.     levothyroxine 25 MCG tablet  Commonly known as:  SYNTHROID, LEVOTHROID  Take 25 mcg by mouth daily.     lisinopril 5 MG tablet  Commonly known as:  PRINIVIL,ZESTRIL  Take 5 mg by mouth daily.     meclizine 25 MG tablet  Commonly known as:  ANTIVERT  Take 25 mg by mouth daily.     Melatonin 5 MG Tabs  Take 10 mg by mouth daily at 8 pm.     methocarbamol 500 MG tablet  Commonly known as:  ROBAXIN  Take 1 tablet (500 mg total) by mouth every 6 (six) hours as needed for muscle spasms.     omeprazole 20 MG capsule  Commonly known as:  PRILOSEC  Take 20 mg by mouth every morning.     polyethylene glycol packet  Commonly known as:  MIRALAX / GLYCOLAX  Take 17 g by mouth daily as needed for mild constipation.     simvastatin 40 MG tablet  Commonly known as:  ZOCOR  Take 40 mg by mouth at bedtime.     tamsulosin 0.4 MG Caps capsule  Commonly known as:  FLOMAX  Take 0.4 mg by mouth daily.     traZODone 50 MG tablet  Commonly known as:  DESYREL  Take 75 mg by mouth at bedtime.     Vitamin D3 1000 UNITS Caps   Take 1,000 Units by mouth every morning.       Allergies  Allergen Reactions  . Penicillins Hives   Follow-up Information    Follow up with Kathryne Hitch, MD. Schedule an appointment as soon as possible for a visit in 2 weeks.   Specialty:  Orthopedic Surgery  Contact information:   [address] [place] [address] [phone]        The results of significant diagnostics from this hospitalization (including imaging, microbiology, ancillary and laboratory) are listed below for reference.    Significant Diagnostic Studies: Ct Abdomen Pelvis Wo Contrast  01/07/2015   CLINICAL DATA:  Left-sided mid abdominal pain for 4 days. History of gastroesophageal reflux disease and gastritis. Recent hip fracture.  EXAM: CT ABDOMEN AND PELVIS WITHOUT CONTRAST  TECHNIQUE: Multidetector CT imaging of the abdomen and pelvis was performed following the standard protocol without IV contrast.  COMPARISON:  Abdominal pelvic CT 07/06/2013.  FINDINGS: Lower chest: There are small bilateral pleural effusions with associated bibasilar atelectasis. The heart is mildly enlarged. There is distal esophageal wall thickening and bilateral gynecomastia.  Hepatobiliary: There is diffuse contour irregularity of the liver consistent with cirrhosis. No focal lesions observed. No evidence of gallstones, gallbladder wall thickening or biliary dilatation.  Pancreas: Unremarkable. No pancreatic ductal dilatation or surrounding inflammatory changes.  Spleen: The spleen is enlarged, measuring 14.9 x 8.7 x 18.5 cm for an estimated volume of 1199 ml. Previously noted infarcts no longer visible.  Adrenals/Urinary Tract: Both adrenal glands appear normal.The kidneys appear normal without evidence of urinary tract calculus or hydronephrosis. No bladder abnormalities are seen.  Stomach/Bowel: No evidence of bowel wall thickening, distention or surrounding inflammatory change.The stomach is mildly distended. The  appendix appears normal. There is no ascites or extraluminal fluid collection.  Vascular/Lymphatic: There are no enlarged abdominal or pelvic lymph nodes. There is stable moderate atherosclerosis of the aorta, its branches and the iliac arteries. Abdominal varices are grossly stable.  Reproductive: Stable without significant findings. There are calcifications of the vas deferens.  Other: Stable umbilical hernia containing fat and asymmetric fat within the left inguinal canal.  Musculoskeletal: Comminuted and displaced intertrochanteric left femur fracture noted as on recent radiographs. No other acute osseous findings seen.  IMPRESSION: 1. No acute abdominal findings observed. 2. Small bilateral pleural effusions with associated bibasilar atelectasis. 3. Grossly stable changes of cirrhosis with splenomegaly and abdominal varices consistent with portal hypertension. No recurrent splenic infarcts identified on non contrast imaging. 4. Stable umbilical hernia containing fat. 5. Distal esophageal wall thickening. 6. Comminuted intertrochanteric left femur fracture.   Electronically Signed   By: Roxy HorsemanBill  Veazey M.D.   On: 01/07/2015 13:24   Dg Chest 1 View  01/07/2015   CLINICAL DATA:  Weakness today. Fall. Tripped over car present. Possible surgery to repair hip fracture.  EXAM: CHEST - 1 VIEW  COMPARISON:  01/04/2014  FINDINGS: Shallow inspiration. Mild cardiac enlargement and pulmonary vascular prominence, some of which may be due to shallow inspiration. No focal airspace disease to suggest edema or consolidation. No blunting of costophrenic angles. No pneumothorax.  IMPRESSION: Cardiac enlargement with mild pulmonary vascular prominence. No evidence of edema or consolidation.   Electronically Signed   By: Burman NievesWilliam  Stevens M.D.   On: 01/07/2015 00:17   Ct Head Wo Contrast  01/07/2015   CLINICAL DATA:  Patient fell today on the left side, striking a hardwood floor. No loss of consciousness.  EXAM: CT HEAD WITHOUT  CONTRAST  TECHNIQUE: Contiguous axial images were obtained from the base of the skull through the vertex without intravenous contrast.  COMPARISON:  10/27/2013  FINDINGS: Mild cerebral atrophy. No ventricular dilatation. No mass effect or midline shift. No abnormal extra-axial fluid collections. Gray-white matter junctions are distinct. Basal cisterns are not effaced. No evidence of acute intracranial  hemorrhage. No depressed skull fractures. Opacification of a left ethmoid air cell. Paranasal sinuses and mastoid air cells are otherwise clear.  IMPRESSION: No acute intracranial abnormalities.  Mild chronic atrophy.   Electronically Signed   By: Burman Nieves M.D.   On: 01/07/2015 00:27   Dg C-arm 1-60 Min-no Report  01/08/2015   CLINICAL DATA:  Status post internal fixation of left hip fracture. Subsequent encounter.  EXAM: DG C-ARM 1-60 MIN - NRPT [facility]; DG HIP W/ PELVIS 1V*L*  COMPARISON:  CT of the abdomen and pelvis performed earlier today at 10:19 a.m.  FINDINGS: There is no evidence of new fracture. The patient is status post placement of intramedullary rod and screw, transfixing the comminuted intertrochanteric fracture in grossly anatomic alignment. The left knee joint is grossly unremarkable. The left femoral head remains seated at the acetabulum. The right hip joint is grossly unremarkable in appearance. Diffuse vascular calcifications are seen. Scattered soft tissue air is noted overlying the operative site, with associated skin staples.  IMPRESSION: Status post internal fixation of left femoral intertrochanteric fracture in grossly anatomic alignment. No new fracture seen.   Electronically Signed   By: Roanna Raider M.D.   On: 01/08/2015 00:29   Dg Hip Unilat With Pelvis 1v Left  01/08/2015   CLINICAL DATA:  Status post internal fixation of left hip fracture. Subsequent encounter.  EXAM: DG C-ARM 1-60 MIN - NRPT [facility]; DG HIP W/ PELVIS 1V*L*  COMPARISON:  CT of the abdomen and pelvis performed  earlier today at 10:19 a.m.  FINDINGS: There is no evidence of new fracture. The patient is status post placement of intramedullary rod and screw, transfixing the comminuted intertrochanteric fracture in grossly anatomic alignment. The left knee joint is grossly unremarkable. The left femoral head remains seated at the acetabulum. The right hip joint is grossly unremarkable in appearance. Diffuse vascular calcifications are seen. Scattered soft tissue air is noted overlying the operative site, with associated skin staples.  IMPRESSION: Status post internal fixation of left femoral intertrochanteric fracture in grossly anatomic alignment. No new fracture seen.   Electronically Signed   By: Roanna Raider M.D.   On: 01/08/2015 00:29   Dg Hip Unilat With Pelvis 2-3 Views Left  01/06/2015   CLINICAL DATA:  Trip and fall injury with left hip pain. Unable to ambulate.  EXAM: DG HIP W/ PELVIS 2-3V*L*  COMPARISON:  None.  FINDINGS: Comminuted inter trochanteric fractures of the left hip with superior displacement of the distal fracture fragment resulting in varus angulation. Mild displacement of greater and lesser trochanteric fragments. Impaction of fracture fragments. No dislocation at the hip joint. Pelvis and right hip appear intact. Vascular calcifications.  IMPRESSION: Comminuted and impacted acute posttraumatic fractures of the inter trochanteric left hip with varus angulation.   Electronically Signed   By: Burman Nieves M.D.   On: 01/06/2015 23:34   Dg Femur Min 2 Views Left  01/07/2015   CLINICAL DATA:  Left hip fracture  EXAM: DG FEMUR 2+V*L*  COMPARISON:  Radiographs 01/06/2015  FINDINGS: Fluoro time 10 seconds  Saved images demonstrate intra medullary left femoral nail with interlocking femoral neck fixation. Anatomic alignment. No immediate complication is evident.  IMPRESSION: ORIF left intertrochanteric fracture   Electronically Signed   By: Ellery Plunk M.D.   On: 01/07/2015 23:38     Microbiology: Recent Results (from the past 240 hour(s))  Surgical pcr screen     Status: Abnormal   Collection Time: 01/07/15  7:44 PM  Result Value Ref Range Status   MRSA, PCR POSITIVE (A) NEGATIVE Final    Comment: RESULT CALLED TO, READ BACK BY AND VERIFIED WITH: CLARK,J/6E  ON 01/07/15 BY KARCZEWSKI,S.    Staphylococcus aureus POSITIVE (A) NEGATIVE Final    Comment:        The Xpert SA Assay (FDA approved for NASAL specimens in patients over 6 years of age), is one component of a comprehensive surveillance program.  Test performance has been validated by [facility] for patients greater than or equal to 71 year old. It is not intended to diagnose infection nor to guide or monitor treatment.   Clostridium Difficile by PCR     Status: None   Collection Time: 01/08/15  3:31 PM  Result Value Ref Range Status   C difficile by pcr NEGATIVE NEGATIVE Final    Comment: Performed at [facility]     Labs: Basic Metabolic Panel:  Recent Labs Lab 01/07/15 0545 01/07/15 1628 01/08/15 0459 01/08/15 1248 01/09/15 0510  NA 137 137 137 138 140  K 5.5* 5.1 5.3* 4.9 4.3  CL 108 109 110 112 113*  CO2 GLUCOSE 175* 168* 279* 268* 101*  BUN 27* 30* 34* 32* 43*  CREATININE 1.46* 1.49* 1.42* 1.42* 1.27  CALCIUM 8.2* 8.2* 7.7* 7.7* 7.7*   Liver Function Tests: No results for input(s): AST, ALT, ALKPHOS, BILITOT, PROT, ALBUMIN in the last 168 hours.  Recent Labs Lab 01/07/15 0545  LIPASE 26   No results for input(s): AMMONIA in the last 168 hours. CBC:  Recent Labs Lab 01/07/15 0015 01/07/15 0545 01/08/15 0459 01/09/15 0510 01/10/15 0528  WBC 1.9* 2.1* 2.3* 2.6* 2.2*  NEUTROABS 1.5*  --   --   --   --   HGB 8.9* 8.5* 9.1* 8.1* 7.9*  HCT 27.7* 26.3* 27.4* 24.7* 23.6*  MCV 91.1 91.6 91.9 91.1 90.4  PLT 72* 73* 53* 63* 61*   Cardiac Enzymes: No results for input(s): CKTOTAL, CKMB, CKMBINDEX, TROPONINI in the last 168  hours. BNP: BNP (last 3 results) No results for input(s): PROBNP in the last 8760 hours. CBG:  Recent Labs Lab 01/09/15 0842 01/09/15 1210 01/09/15 1627 01/09/15 2141 01/10/15 0730  GLUCAP 106* 209* 252* 176* 136*       SignedCalvert Cantor, MD [facility] 01/10/2015, 10:07 AM

## 2015-01-09 NOTE — Progress Notes (Signed)
Physical Therapy Treatment Patient Details Name: ZAIDEN LUDLUM MRN: [id number] DOB: 25-May-1946 Today's Date: 01/09/2015    History of Present Illness L hip fx s/p IM nailing    PT Comments    Marked improvement in pt level of alertness and ability to follow commands.  Pt continues to require assist of 2 for all mobility and is high fall risk.  Follow Up Recommendations  SNF     Equipment Recommendations  None recommended by PT    Recommendations for Other Services       Precautions / Restrictions Precautions Precautions: Fall Restrictions Weight Bearing Restrictions: No LLE Weight Bearing: Weight bearing as tolerated    Mobility  Bed Mobility Overal bed mobility: Needs Assistance;+2 for physical assistance Bed Mobility: Supine to Sit;Sit to Supine     Supine to sit: Mod assist;+2 for physical assistance;+2 for safety/equipment Sit to supine: Mod assist;+2 for physical assistance;+2 for safety/equipment   General bed mobility comments: utilized pad to transition pt to EOB.    Transfers Overall transfer level: Needs assistance Equipment used: Rolling walker (2 wheeled) Transfers: Sit to/from Stand Sit to Stand: Mod assist;+2 physical assistance;From elevated surface         General transfer comment: Cues for LE management and use of UEs to self assist. Physical assist to bring wt up and fwd and to balance in standing.    Ambulation/Gait Ambulation/Gait assistance: Mod assist;+2 physical assistance;+2 safety/equipment Ambulation Distance (Feet): 2 Feet Assistive device: Rolling walker (2 wheeled) Gait Pattern/deviations: Step-to pattern;Decreased step length - right;Decreased step length - left;Shuffle Gait velocity: decr   General Gait Details: Pt stood x 4 min with mod assist and RW.  Pt unable to initiate fwd step with either LE but able to take small side steps up side of bed.  Constant cues for posture, position from RW and increased UE WB   Stairs             Wheelchair Mobility    Modified Rankin (Stroke Patients Only)       Balance Overall balance assessment: Needs assistance Sitting-balance support: Single extremity supported;Feet supported Sitting balance-Leahy Scale: Poor     Standing balance support: Bilateral upper extremity supported Standing balance-Leahy Scale: Zero                      Cognition Arousal/Alertness: Awake/alert Behavior During Therapy: Flat affect Overall Cognitive Status: History of cognitive impairments - at baseline                      Exercises      General Comments        Pertinent Vitals/Pain Pain Assessment: Faces Faces Pain Scale: Hurts even more Pain Location: L hip Pain Intervention(s): Limited activity within patient's tolerance;Monitored during session;Premedicated before session    Home Living                      Prior Function            PT Goals (current goals can now be found in the care plan section) Acute Rehab PT Goals Patient Stated Goal: sit up PT Goal Formulation: Patient unable to participate in goal setting Time For Goal Achievement: 01/15/15 Potential to Achieve Goals: Fair Progress towards PT goals: Progressing toward goals    Frequency  Min 3X/week    PT Plan Current plan remains appropriate    Co-evaluation  End of Session Equipment Utilized During Treatment: Gait belt Activity Tolerance: Patient limited by fatigue;Patient limited by pain Patient left: in bed;with call bell/phone within reach     Time: 1339-1356 PT Time Calculation (min) (ACUTE ONLY): 17 min  Charges:  $Therapeutic Activity: 8-22 mins                    G Codes:      Jaana Brodt 01/09/2015, 3:22 PM

## 2015-01-09 NOTE — Progress Notes (Signed)
Pt has ST Rehab bed at [facility] if stable for D/C. CSW will assist with d/c planning to SNF.  Cori RazorJamie Tomie Spizzirri LCSW [phone]

## 2015-01-09 NOTE — Discharge Instructions (Signed)
Full weight bearing as tolerated on left hip with assistance. Can get current hip dressing wet in the shower. Leave current dressing on, alone, and in place until his outpatient orthopedic follow-up.

## 2015-01-09 NOTE — Progress Notes (Signed)
[facility] Progress Note   [name] WUJ:[id number] DOB: 08-Sep-1946 DOA: 01/06/2015 PCP: Florentina JennyRIPP, [name]  Brief narrative: [name] is a 69 y.o. male with past medical history of hypertension hyperlipidemia gastroesophageal reflux disease, diabetes mellitus, CK D3, alcoholic cirrhosis who presented after a fall and is found to have a left hip fracture.   Subjective: Complains of nausea again. Lightheaded when getting up.   Assessment/Plan: Principal Problem:   Hip fracture, left - Ortho has consulted- s/p IM nail  Active Problems:   Nausea vomiting and diarrhea - This had resolved but seems to be coming back with initiation of solid food-will continue to follow- CT of abd/pelvis unrevealing  Hyperkalemia - resolved    Depression/Anxiety - Continue Wellbutrin, Prozac and trazodone  CKD 3 - stable    GERD (gastroesophageal reflux disease) -Continue Protonix    Hyperlipidemia - Continue simvastatin and fenofibrate    DM (diabetes mellitus) - Continue Lantus and NovoLog but dose adjusted as he is nothing by mouth    Other pancytopenia - Patient workup underway    Hypothyroidism - Continue Synthroid    Code Status: full code Family Communication:  Disposition Plan: to be determined with PT eval DVT prophylaxis: per ortho  Consultants: ortho  Procedures: Left hip ORIF with IM nail  Antibiotics: Anti-infectives    Start     Dose/Rate Route Frequency Ordered Stop   01/08/15 0500  clindamycin (CLEOCIN) IVPB 600 mg     600 mg100 mL/hr over 30 Minutes Intravenous Every 6 hours 01/08/15 0135 01/08/15 1100   01/07/15 2215  clindamycin (CLEOCIN) IVPB 900 mg     900 mg100 mL/hr over 30 Minutes Intravenous  Once 01/07/15 2212 01/07/15 2325         Objective: Filed Weights   01/07/15 0315 01/09/15 0118  Weight: 86.7 kg (191 lb 2.2 oz) 85.5 kg (188 lb 7.9 oz)    Intake/Output Summary (Last 24 hours) at 01/09/15 1638 Last data filed at  01/09/15 1507  Gross per 24 hour  Intake   2220 ml  Output    600 ml  Net   1620 ml     Vitals Filed Vitals:   01/09/15 0118 01/09/15 0442 01/09/15 0800 01/09/15 1406  BP:  116/46  132/49  Pulse:  74  80  Temp:  98.9 F (37.2 C)  98.8 F (37.1 C)  TempSrc:  Oral  Oral  Resp:  18 16 18   Height:      Weight: 85.5 kg (188 lb 7.9 oz)     SpO2:  94% 89% 92%    Exam: General: No acute respiratory distress Lungs: Clear to auscultation bilaterally without wheezes or crackles Cardiovascular: Regular rate and rhythm without murmur gallop or rub normal S1 and S2 Abdomen: Nontender, nondistended, soft, bowel sounds positive, no rebound, no ascites, no appreciable mass Extremities: No significant cyanosis, clubbing, or edema bilateral lower extremities  Data Reviewed: Basic Metabolic Panel:  Recent Labs Lab 01/07/15 0545 01/07/15 1628 01/08/15 0459 01/08/15 1248 01/09/15 0510  NA 137 137 137 138 140  K 5.5* 5.1 5.3* 4.9 4.3  CL 108 109 110 112 113*  CO2 23 23 20 22 22   GLUCOSE 175* 168* 279* 268* 101*  BUN 27* 30* 34* 32* 43*  CREATININE 1.46* 1.49* 1.42* 1.42* 1.27  CALCIUM 8.2* 8.2* 7.7* 7.7* 7.7*   Liver Function Tests: No results for input(s): AST, ALT, ALKPHOS, BILITOT, PROT, ALBUMIN in the last 168 hours.  Recent Labs Lab  01/07/15 0545  LIPASE 26   No results for input(s): AMMONIA in the last 168 hours. CBC:  Recent Labs Lab 01/07/15 0015 01/07/15 0545 01/08/15 0459 01/09/15 0510  WBC 1.9* 2.1* 2.3* 2.6*  NEUTROABS 1.5*  --   --   --   HGB 8.9* 8.5* 9.1* 8.1*  HCT 27.7* 26.3* 27.4* 24.7*  MCV 91.1 91.6 91.9 91.1  PLT 72* 73* 53* 63*   Cardiac Enzymes: No results for input(s): CKTOTAL, CKMB, CKMBINDEX, TROPONINI in the last 168 hours. BNP (last 3 results) No results for input(s): PROBNP in the last 8760 hours. CBG:  Recent Labs Lab 01/08/15 1957 01/09/15 0005 01/09/15 0427 01/09/15 0842 01/09/15 1210  GLUCAP 182* 130* 101* 106* 209*     Recent Results (from the past 240 hour(s))  Surgical pcr screen     Status: Abnormal   Collection Time: 01/07/15  7:44 PM  Result Value Ref Range Status   MRSA, PCR POSITIVE (A) NEGATIVE Final    Comment: RESULT CALLED TO, READ BACK BY AND VERIFIED WITH: CLARK,J/6E  ON 01/07/15 BY KARCZEWSKI,S.    Staphylococcus aureus POSITIVE (A) NEGATIVE Final    Comment:        The Xpert SA Assay (FDA approved for NASAL specimens in patients over 70 years of age), is one component of a comprehensive surveillance program.  Test performance has been validated by [facility] for patients greater than or equal to 33 year old. It is not intended to diagnose infection nor to guide or monitor treatment.   Clostridium Difficile by PCR     Status: None   Collection Time: 01/08/15  3:31 PM  Result Value Ref Range Status   C difficile by pcr NEGATIVE NEGATIVE Final    Comment: Performed at [facility]     Studies:  Recent x-ray studies have been reviewed in detail by the Attending Physician  Scheduled Meds:  Scheduled Meds: . sodium chloride   Intravenous Once  . aspirin EC  325 mg Oral Q breakfast  . buPROPion  150 mg Oral q morning - 10a  . Chlorhexidine Gluconate Cloth  6 each Topical Q0600  . cholecalciferol  1,000 Units Oral q morning - 10a  . fenofibrate  160 mg Oral q morning - 10a  . ferrous sulfate  325 mg Oral Q breakfast  . FLUoxetine  60 mg Oral Daily  . insulin aspart  0-9 Units Subcutaneous 6 times per day  . insulin glargine  30 Units Subcutaneous BID  . lamoTRIgine  50 mg Oral BID  . levothyroxine  25 mcg Oral QAC breakfast  . meclizine  25 mg Oral Daily  . multivitamin with minerals  1 tablet Oral q morning - 10a  . mupirocin ointment  1 application Nasal BID  . pantoprazole  40 mg Oral Daily  . simvastatin  40 mg Oral QHS  . tamsulosin  0.4 mg Oral Daily  . traZODone  75 mg Oral QHS   Continuous Infusions: . sodium chloride 1,000 mL (01/09/15  0429)    Time spent on care of this patient: 35 min   Doninique Lwin, [name] 01/09/2015, 4:38 PM  LOS: 3 days   [facility] Office  [phone] Pager - Text Page per [email or web address]  If 7PM-7AM, please contact night-coverage [email or web address]

## 2015-01-09 NOTE — Progress Notes (Signed)
Subjective: 2 Days Post-Op Procedure(s) (LRB): INTRAMEDULLARY (IM) NAIL INTERTROCHANTRIC (Left) Patient reports pain as moderate.  Acute on chronic blood loss anemia.  Vitals stable.  Very poor mobility.  SNF placement in the works.  Objective: Vital signs in last 24 hours: Temp:  [98.8 F (37.1 C)-99.7 F (37.6 C)] 98.9 F (37.2 C) (01/28 0442) Pulse Rate:  [72-74] 74 (01/28 0442) Resp:  [16-18] 16 (01/28 0800) BP: (112-116)/(46-55) 116/46 mmHg (01/28 0442) SpO2:  [89 %-100 %] 89 % (01/28 0800) Weight:  [85.5 kg (188 lb 7.9 oz)] 85.5 kg (188 lb 7.9 oz) (01/28 0118)  Intake/Output from previous day: 01/27 0701 - 01/28 0700 In: 5235 [P.O.:360; I.V.:4875] Out: 850 [Urine:850] Intake/Output this shift:     Recent Labs  01/07/15 0015 01/07/15 0545 01/08/15 0459 01/09/15 0510  HGB 8.9* 8.5* 9.1* 8.1*    Recent Labs  01/08/15 0459 01/09/15 0510  WBC 2.3* 2.6*  RBC 2.98* 2.71*  HCT 27.4* 24.7*  PLT 53* 63*    Recent Labs  01/08/15 1248 01/09/15 0510  NA 138 140  K 4.9 4.3  CL 112 113*  CO2 22 22  BUN 32* 43*  CREATININE 1.42* 1.27  GLUCOSE 268* 101*  CALCIUM 7.7* 7.7*    Recent Labs  01/07/15 0015 01/07/15 0545  INR 1.50* 1.58*    Sensation intact distally Intact pulses distally Dorsiflexion/Plantar flexion intact Incision: scant drainage No cellulitis present Compartment soft  Assessment/Plan: 2 Days Post-Op Procedure(s) (LRB): INTRAMEDULLARY (IM) NAIL INTERTROCHANTRIC (Left) Up with therapy Discharge to SNF (ok from ortho standpoint.)  Kathryne HitchBLACKMAN,Layten Aiken Y 01/09/2015, 10:27 AM

## 2015-01-10 LAB — CBC
HEMATOCRIT: 23.6 % — AB (ref 39.0–52.0)
Hemoglobin: 7.9 g/dL — ABNORMAL LOW (ref 13.0–17.0)
MCH: 30.3 pg (ref 26.0–34.0)
MCHC: 33.5 g/dL (ref 30.0–36.0)
MCV: 90.4 fL (ref 78.0–100.0)
PLATELETS: 61 10*3/uL — AB (ref 150–400)
RBC: 2.61 MIL/uL — ABNORMAL LOW (ref 4.22–5.81)
RDW: 15.8 % — AB (ref 11.5–15.5)
WBC: 2.2 10*3/uL — ABNORMAL LOW (ref 4.0–10.5)

## 2015-01-10 LAB — GLUCOSE, CAPILLARY
GLUCOSE-CAPILLARY: 155 mg/dL — AB (ref 70–99)
Glucose-Capillary: 136 mg/dL — ABNORMAL HIGH (ref 70–99)
Glucose-Capillary: 156 mg/dL — ABNORMAL HIGH (ref 70–99)
Glucose-Capillary: 160 mg/dL — ABNORMAL HIGH (ref 70–99)

## 2015-01-10 MED ORDER — INSULIN ASPART 100 UNIT/ML ~~LOC~~ SOLN: 0.0000 [IU] | Freq: Three times a day (TID) | SUBCUTANEOUS | Status: DC

## 2015-01-10 NOTE — Progress Notes (Signed)
Patient examined today. D/C summary from 1/28 updated. D/C to SNF today.  [name] CantorSaima Kandon Hosking, MD

## 2015-01-10 NOTE — Care Management Note (Signed)
Medicare Important Message given?  YES (If response is "NO", the following Medicare IM given date fields will be blank) Date Medicare IM given:  01/10/2015 Medicare IM given by:  Bhavya Grand Date Additional Medicare IM given:   Additional Medicare IM given by:   

## 2015-01-10 NOTE — Progress Notes (Signed)
[facility] has a bed today for pt if stable for d/c . CSW will assist with d/c planning to SNF.   Cori RazorJamie Oliviagrace Crisanti LCSW [phone]

## 2015-01-10 NOTE — Progress Notes (Addendum)
CSW continues to assist with d/c planning to SNF. [facility] / [facility] unable to offer rehab bed at this time due to pt's hx of angry outbursts. Family is aware. Wandra MannanZack Brooks, Assistant DIR CSW is assisting with SNF placement.  Cori RazorJamie Alyssamae Klinck LCSW [phone]  [facility] bed available at [facility] today. Pt / family are aware and in agreement with d/c plan. NSG reviewed d/c summary, scripts, avs. Scripts included in d/c packet. [facility] transport required.  Cori RazorJamie Ollie Esty LCSW [phone]

## 2015-01-10 NOTE — Progress Notes (Signed)
[facility] arrived to transport patient to SNF.

## 2015-01-10 NOTE — Progress Notes (Signed)
RN called report to [facility].   All questions answered.   Patient awaiting [facility] transport at this time

## 2015-01-11 LAB — TYPE AND SCREEN
ABO/RH(D): O POS
ANTIBODY SCREEN: NEGATIVE
Unit division: 0
Unit division: 0

## 2015-01-11 LAB — CLOSTRIDIUM DIFFICILE BY PCR: CDIFFPCR: NEGATIVE

## 2015-01-11 NOTE — Anesthesia Postprocedure Evaluation (Signed)
  Anesthesia Post-op Note  Patient: [name] NakaiMichael L Askin  Procedure(s) Performed: Procedure(s): INTRAMEDULLARY (IM) NAIL INTERTROCHANTRIC (Left)  Patient Location: PACU  Anesthesia Type:General  Level of Consciousness: awake  Airway and Oxygen Therapy: Patient Spontanous Breathing  Post-op Pain: mild  Post-op Assessment: Post-op Vital signs reviewed, Patient's Cardiovascular Status Stable, Respiratory Function Stable, Patent Airway, No signs of Nausea or vomiting and Pain level controlled  Post-op Vital Signs: Reviewed and stable  Last Vitals:  Filed Vitals:   01/10/15 1600  BP:   Pulse:   Temp:   Resp: 16    Complications: No apparent anesthesia complications

## 2015-01-15 ENCOUNTER — Encounter: Payer: Self-pay | Admitting: Internal Medicine

## 2015-01-15 ENCOUNTER — Non-Acute Institutional Stay (SKILLED_NURSING_FACILITY): Payer: Medicare Other | Admitting: Internal Medicine

## 2015-01-15 DIAGNOSIS — S72002S Fracture of unspecified part of neck of left femur, sequela: Secondary | ICD-10-CM

## 2015-01-15 DIAGNOSIS — F419 Anxiety disorder, unspecified: Secondary | ICD-10-CM

## 2015-01-15 DIAGNOSIS — N183 Chronic kidney disease, stage 3 unspecified: Secondary | ICD-10-CM | POA: Insufficient documentation

## 2015-01-15 DIAGNOSIS — E785 Hyperlipidemia, unspecified: Secondary | ICD-10-CM

## 2015-01-15 DIAGNOSIS — D61818 Other pancytopenia: Secondary | ICD-10-CM

## 2015-01-15 DIAGNOSIS — E118 Type 2 diabetes mellitus with unspecified complications: Secondary | ICD-10-CM

## 2015-01-15 DIAGNOSIS — F32A Depression, unspecified: Secondary | ICD-10-CM

## 2015-01-15 DIAGNOSIS — E034 Atrophy of thyroid (acquired): Secondary | ICD-10-CM

## 2015-01-15 DIAGNOSIS — I10 Essential (primary) hypertension: Secondary | ICD-10-CM

## 2015-01-15 DIAGNOSIS — E038 Other specified hypothyroidism: Secondary | ICD-10-CM

## 2015-01-15 DIAGNOSIS — K219 Gastro-esophageal reflux disease without esophagitis: Secondary | ICD-10-CM

## 2015-01-15 DIAGNOSIS — F329 Major depressive disorder, single episode, unspecified: Secondary | ICD-10-CM

## 2015-01-15 DIAGNOSIS — R339 Retention of urine, unspecified: Secondary | ICD-10-CM

## 2015-01-15 NOTE — Assessment & Plan Note (Signed)
Continue protonix  

## 2015-01-15 NOTE — Assessment & Plan Note (Signed)
Controlled on Lisinopril 5 mg

## 2015-01-15 NOTE — Assessment & Plan Note (Signed)
Work-up underway as outpt

## 2015-01-15 NOTE — Assessment & Plan Note (Signed)
Left hip Intertrochanteric fracture - s/p Intramedullary nail on 1/27- doing well with rehab

## 2015-01-15 NOTE — Assessment & Plan Note (Signed)
Continue Wellbutrin, Prozac and trazodone

## 2015-01-15 NOTE — Progress Notes (Signed)
MRN: [id number] Name: [name]  Sex: male Age: 69 y.o. DOB: [birth year]  [id number] Facility/Room:216A Level Of Care: SNF Provider: Merrilee Seashore D Emergency Contacts: Extended Emergency Contact Information Primary Emergency Contact: [name],[name] Address: [address]          [place], [place] Home Phone: [phone] Mobile Phone: [phone] Relation: Sister  Code Status:FULL   Allergies: Penicillins  Chief Complaint  Patient presents with  . New Admit To SNF    HPI: Patient is 69 y.o. male who is admitted to SNF for OT/PT after fracture repair for L hip fx.  Past Medical History  Diagnosis Date  . Hyperglycemia   . Hyponatremia   . DMII (diabetes mellitus, type 2)   . Depression   . IBS (irritable bowel syndrome)   . Chest pain   . Unspecified gastritis and gastroduodenitis without mention of hemorrhage   . Arthropathy, unspecified, site unspecified   . History of colon polyps   . Anemia, unspecified   . Anxiety   . GERD (gastroesophageal reflux disease)   . Hyperlipidemia   . Hypertension     Past Surgical History  Procedure Laterality Date  . Hernia repair  1964  . Intramedullary (im) nail intertrochanteric Left 01/07/2015    Procedure: INTRAMEDULLARY (IM) NAIL INTERTROCHANTRIC;  Surgeon: Kathryne Hitch, MD;  Location: [facility] ORS;  Service: Orthopedics;  Laterality: Left;      Medication List       This list is accurate as of: 01/15/15  9:10 PM.  Always use your most recent med list.               aspirin 325 MG EC tablet  Take 1 tablet (325 mg total) by mouth daily with breakfast.     BENEFIBER Powd  Take 2 mLs by mouth every morning. Mix in 4 oz of water and drink     buPROPion 150 MG 24 hr tablet  Commonly known as:  WELLBUTRIN XL  Take 150 mg by mouth every morning.     DAILY VITE Tabs  Take 1 tablet by mouth every morning.     fenofibrate 160 MG tablet  Take 160 mg by mouth every  morning.     ferrous sulfate 325 (65 FE) MG tablet  Take 325 mg by mouth daily with breakfast.     FLUoxetine 20 MG capsule  Commonly known as:  PROZAC  Take 60 mg by mouth daily.     glucose blood test strip  1 each by Other route 4 (four) times daily -  before meals and at bedtime. Use as instructed     HYDROcodone-acetaminophen 5-325 MG per tablet  Commonly known as:  NORCO/VICODIN  Take 1 tablet by mouth every 6 (six) hours as needed for moderate pain.     insulin aspart 100 UNIT/ML injection  Commonly known as:  novoLOG  Inject 0-9 Units into the skin 3 (three) times daily with meals.     insulin glargine 100 UNIT/ML injection  Commonly known as:  LANTUS  Inject 36 Units into the skin 2 (two) times daily.     lamoTRIgine 25 MG tablet  Commonly known as:  LAMICTAL  Take 50 mg by mouth 2 (two) times daily.     levothyroxine 25 MCG tablet  Commonly known as:  SYNTHROID, LEVOTHROID  Take 25 mcg by mouth daily.     lisinopril 5 MG tablet  Commonly known as:  PRINIVIL,ZESTRIL  Take  5 mg by mouth daily.     meclizine 25 MG tablet  Commonly known as:  ANTIVERT  Take 25 mg by mouth daily.     Melatonin 5 MG Tabs  Take 10 mg by mouth daily at 8 pm.     methocarbamol 500 MG tablet  Commonly known as:  ROBAXIN  Take 1 tablet (500 mg total) by mouth every 6 (six) hours as needed for muscle spasms.     omeprazole 20 MG capsule  Commonly known as:  PRILOSEC  Take 20 mg by mouth every morning.     polyethylene glycol packet  Commonly known as:  MIRALAX / GLYCOLAX  Take 17 g by mouth daily as needed for mild constipation.     simvastatin 40 MG tablet  Commonly known as:  ZOCOR  Take 40 mg by mouth at bedtime.     tamsulosin 0.4 MG Caps capsule  Commonly known as:  FLOMAX  Take 0.4 mg by mouth daily.     traZODone 50 MG tablet  Commonly known as:  DESYREL  Take 75 mg by mouth at bedtime.     Vitamin D3 1000 UNITS Caps  Take 1,000 Units by mouth every morning.         No orders of the defined types were placed in this encounter.    Immunization History  Administered Date(s) Administered  . Influenza-Unspecified 09/13/2011  . PPD Test 01/07/2014  . Pneumococcal-Unspecified 12/13/2009  . Tdap 12/13/2009    History  Substance Use Topics  . Smoking status: Former Smoker -- 1.00 packs/day    Types: Cigarettes  . Smokeless tobacco: Not on file  . Alcohol Use: No    Family history is noncontributory    Review of Systems  DATA OBTAINED: from patient; no c/o, ready to work hard and get home GENERAL:  no fevers, fatigue, appetite changes SKIN: No itching, rash or wounds EYES: No eye pain, redness, discharge EARS: No earache, tinnitus, change in hearing NOSE: No congestion, drainage or bleeding  MOUTH/THROAT: No mouth or tooth pain, No sore throat RESPIRATORY: No cough, wheezing, SOB CARDIAC: No chest pain, palpitations, lower extremity edema  GI: No abdominal pain, No N/V/D or constipation, No heartburn or reflux  GU: No dysuria, frequency or urgency, or incontinence  MUSCULOSKELETAL: No unrelieved bone/joint pain NEUROLOGIC: No headache, dizziness or focal weakness PSYCHIATRIC: No overt anxiety or sadness, No behavior issue.   Filed Vitals:   01/15/15 2056  BP: 136/64  Pulse: 60  Temp: 97.2 F (36.2 C)  Resp: 18    Physical Exam  GENERAL APPEARANCE: Alert, conversant,  No acute distress.  SKIN: No diaphoresis rash; incision without heat or swelling HEAD: Normocephalic, atraumatic  EYES: Conjunctiva/lids clear. Pupils round, reactive. EOMs intact.  EARS: External exam WNL, canals clear. Hearing grossly normal.  NOSE: No deformity or discharge.  MOUTH/THROAT: Lips w/o lesions  RESPIRATORY: Breathing is even, unlabored. Lung sounds are clear   CARDIOVASCULAR: Heart RRR no murmurs, rubs or gallops. No peripheral edema.   GASTROINTESTINAL: Abdomen is soft, non-tender, not distended w/ normal bowel sounds. GENITOURINARY:  Bladder non tender, not distended  MUSCULOSKELETAL: No abnormal joints or musculature NEUROLOGIC:  Cranial nerves 2-12 grossly intact  PSYCHIATRIC: Mood and affect appropriate to situation, no behavioral issues  Patient Active Problem List   Diagnosis Date Noted  . Urinary retention 01/15/2015  . CKD (chronic kidney disease) stage 3, GFR 30-59 ml/min 01/15/2015  . Closed left hip fracture 01/07/2015  . Hip fracture, left  01/07/2015  . Hip fracture 01/07/2015  . Hypothyroidism 01/07/2015  . Nausea vomiting and diarrhea   . Generalized weakness 07/20/2013  . Other pancytopenia 02/23/2013  . Internal and external bleeding hemorrhoids 11/29/2012  . Pruritus ani 11/29/2012  . DM (diabetes mellitus), type 2 with complications 11/13/2012  . Type 1 diabetes mellitus with neurological manifestations, uncontrolled 01/24/2012  . Depression 01/24/2012  . Psoriasis 01/24/2012  . Macrocytic anemia   . Anxiety   . GERD (gastroesophageal reflux disease)   . Hyperlipidemia   . Hypertension     CBC    Component Value Date/Time   WBC 2.2* 01/10/2015 0528   WBC 2.0* 09/26/2014 1127   RBC 2.61* 01/10/2015 0528   RBC 2.95* 09/26/2014 1127   RBC 4.13* 09/27/2009 0545   HGB 7.9* 01/10/2015 0528   HGB 9.0* 09/26/2014 1127   HCT 23.6* 01/10/2015 0528   HCT 28.5* 09/26/2014 1127   PLT 61* 01/10/2015 0528   PLT 86* 09/26/2014 1127   MCV 90.4 01/10/2015 0528   MCV 96.8 09/26/2014 1127   LYMPHSABS 0.2* 01/07/2015 0015   LYMPHSABS 0.4* 09/26/2014 1127   MONOABS 0.2 01/07/2015 0015   MONOABS 0.2 09/26/2014 1127   EOSABS 0.0 01/07/2015 0015   EOSABS 0.0 09/26/2014 1127   BASOSABS 0.0 01/07/2015 0015   BASOSABS 0.0 09/26/2014 1127    CMP     Component Value Date/Time   NA 140 01/09/2015 0510   NA 141 09/26/2014 1127   K 4.3 01/09/2015 0510   K 4.8 09/26/2014 1127   CL 113* 01/09/2015 0510   CO2 22 01/09/2015 0510   CO2 23 09/26/2014 1127   GLUCOSE 101* 01/09/2015 0510   GLUCOSE 106  09/26/2014 1127   BUN 43* 01/09/2015 0510   BUN 29.5* 09/26/2014 1127   CREATININE 1.27 01/09/2015 0510   CREATININE 1.6* 09/26/2014 1127   CALCIUM 7.7* 01/09/2015 0510   CALCIUM 9.6 09/26/2014 1127   PROT 7.7 09/26/2014 1127   PROT 8.8* 01/04/2014 1631   ALBUMIN 3.3* 09/26/2014 1127   ALBUMIN 3.3* 01/04/2014 1631   AST 30 09/26/2014 1127   AST 52* 01/04/2014 1631   ALT 17 09/26/2014 1127   ALT 39 01/04/2014 1631   ALKPHOS 42 09/26/2014 1127   ALKPHOS 64 01/04/2014 1631   BILITOT 0.49 09/26/2014 1127   BILITOT 0.4 01/04/2014 1631   GFRNONAA 57* 01/09/2015 0510   GFRAA 66* 01/09/2015 0510    Assessment and Plan  Hip fracture, left Left hip Intertrochanteric fracture - s/p Intramedullary nail on 1/27- doing well with rehab   Urinary retention On the evening of the 28th, he began to retain urine and an I and O cath was needed- We have now placed a foley cath as he is still unable to void with > 500 cc or urine noted in his bladder on the bladder scanner   Depression Continue Wellbutrin, Prozac and trazodone   Anxiety Continue Wellbutrin, Prozac and trazodone   CKD (chronic kidney disease) stage 3, GFR 30-59 ml/min stable- Cr 1.27 today   Hyperlipidemia Continue simvastatin and fenofibrate   GERD (gastroesophageal reflux disease) Continue protonix   DM (diabetes mellitus), type 2 with complications Continue Lantus and NovoLog; A1c 6.3; pt on ACE and statin   Hypothyroidism TSH 1.728;Continue synthroid 25 mcg   Other pancytopenia Work-up underway as outpt   Hypertension Controlled on Lisinopril 5 mg     Margit HanksALEXANDER, Art Levan D, MD

## 2015-01-15 NOTE — Assessment & Plan Note (Signed)
TSH 1.728;Continue synthroid 25 mcg

## 2015-01-15 NOTE — Assessment & Plan Note (Signed)
On the evening of the 28th, he began to retain urine and an I and O cath was needed- We have now placed a foley cath as he is still unable to void with > 500 cc or urine noted in his bladder on the bladder scanner

## 2015-01-15 NOTE — Assessment & Plan Note (Signed)
Continue simvastatin and fenofibrate

## 2015-01-15 NOTE — Assessment & Plan Note (Signed)
stable- Cr 1.27 today

## 2015-01-15 NOTE — Assessment & Plan Note (Signed)
Continue Wellbutrin, Prozac and trazodone 

## 2015-01-15 NOTE — Assessment & Plan Note (Signed)
Continue Lantus and NovoLog; A1c 6.3; pt on ACE and statin

## 2015-01-23 ENCOUNTER — Other Ambulatory Visit (HOSPITAL_BASED_OUTPATIENT_CLINIC_OR_DEPARTMENT_OTHER): Payer: Medicare Other

## 2015-01-23 ENCOUNTER — Telehealth: Payer: Self-pay | Admitting: Oncology

## 2015-01-23 ENCOUNTER — Ambulatory Visit (HOSPITAL_BASED_OUTPATIENT_CLINIC_OR_DEPARTMENT_OTHER): Payer: Medicare Other | Admitting: Oncology

## 2015-01-23 VITALS — BP 104/45 | HR 71 | Temp 98.0°F | Resp 18 | Ht 66.0 in | Wt 171.4 lb

## 2015-01-23 DIAGNOSIS — D649 Anemia, unspecified: Secondary | ICD-10-CM

## 2015-01-23 DIAGNOSIS — D61818 Other pancytopenia: Secondary | ICD-10-CM

## 2015-01-23 DIAGNOSIS — D539 Nutritional anemia, unspecified: Secondary | ICD-10-CM

## 2015-01-23 DIAGNOSIS — D696 Thrombocytopenia, unspecified: Secondary | ICD-10-CM

## 2015-01-23 DIAGNOSIS — R161 Splenomegaly, not elsewhere classified: Secondary | ICD-10-CM

## 2015-01-23 LAB — CBC WITH DIFFERENTIAL/PLATELET
BASO%: 0.5 % (ref 0.0–2.0)
Basophils Absolute: 0 10*3/uL (ref 0.0–0.1)
EOS%: 1.9 % (ref 0.0–7.0)
Eosinophils Absolute: 0.1 10*3/uL (ref 0.0–0.5)
HEMATOCRIT: 29.9 % — AB (ref 38.4–49.9)
HGB: 9.4 g/dL — ABNORMAL LOW (ref 13.0–17.1)
LYMPH%: 12 % — AB (ref 14.0–49.0)
MCH: 28.4 pg (ref 27.2–33.4)
MCHC: 31.5 g/dL — AB (ref 32.0–36.0)
MCV: 90 fL (ref 79.3–98.0)
MONO#: 0.2 10*3/uL (ref 0.1–0.9)
MONO%: 7.8 % (ref 0.0–14.0)
NEUT#: 2.4 10*3/uL (ref 1.5–6.5)
NEUT%: 77.8 % — AB (ref 39.0–75.0)
PLATELETS: 148 10*3/uL (ref 140–400)
RBC: 3.32 10*6/uL — ABNORMAL LOW (ref 4.20–5.82)
RDW: 15.8 % — ABNORMAL HIGH (ref 11.0–14.6)
WBC: 3.1 10*3/uL — AB (ref 4.0–10.3)
lymph#: 0.4 10*3/uL — ABNORMAL LOW (ref 0.9–3.3)

## 2015-01-23 LAB — COMPREHENSIVE METABOLIC PANEL (CC13)
ALT: 26 U/L (ref 0–55)
AST: 57 U/L — ABNORMAL HIGH (ref 5–34)
Albumin: 2.7 g/dL — ABNORMAL LOW (ref 3.5–5.0)
Alkaline Phosphatase: 144 U/L (ref 40–150)
Anion Gap: 10 mEq/L (ref 3–11)
BILIRUBIN TOTAL: 0.9 mg/dL (ref 0.20–1.20)
BUN: 31.2 mg/dL — ABNORMAL HIGH (ref 7.0–26.0)
CO2: 24 mEq/L (ref 22–29)
Calcium: 9.1 mg/dL (ref 8.4–10.4)
Chloride: 103 mEq/L (ref 98–109)
Creatinine: 1.2 mg/dL (ref 0.7–1.3)
EGFR: 59 mL/min/{1.73_m2} — AB (ref >90)
GLUCOSE: 224 mg/dL — AB (ref 70–140)
Potassium: 4.6 mEq/L (ref 3.5–5.1)
Sodium: 138 mEq/L (ref 136–145)
Total Protein: 7.7 g/dL (ref 6.4–8.3)

## 2015-01-23 NOTE — Telephone Encounter (Signed)
Gave avs & calendar for July °

## 2015-01-23 NOTE — Progress Notes (Signed)
Hematology and Oncology Follow Up Visit  [name] [id number] [birth year] 69 y.o. 01/23/2015 1:44 PM [name], MDTripp, [name], MD   Principle Diagnosis: 69 year old man with pancytopenia diagnosed in 01/2013. 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 Hematological disorder is less likely and his workup has been unrevealing.  Prior therapy: He is status post bone marrow biopsy done on 07/18/2013 which showed no evidence of any myelodysplasia or lymphoproliferative disorder.  Interim History:  [name] presents for a follow up visit with his sister. Since his last visit, he sustained a hip fracture and underwent an operation on 01/07/2015 to repair that fracture. He tolerated the procedure well but his hemoglobin drifted to 7.9 and required packed red cell transfusions. He was discharged to a skilled nursing facility to receive rehabilitation. Since his discharge, he is not reporting any major complaints. His pain is reasonably controlled and is participating in physical therapy.  He is not reporting symptoms of abdominal pain or distention. Is not reporting any dizziness or unsteadiness. He is not reporting any chest pain or difficulty breathing.  He is eating better with any GI complaints. He does report some occasional hemorrhoidal bleeding but underwent a colonoscopy which was unremarkable. He does not report any hemoptysis or hematemesis. He did have one episode of epistaxis that spontaneously resolved today. Does not report any recurrent infections. Has not reported any constitutional symptoms of weight loss or appetite changes. His performance status and activity level remain stable. He does not report any fevers or chills or sweats. As that report any abdominal pain or early satiety. Did not report any hematuria or dysuria. Does not report any muscle or joint pain. Does not report any lymphadenopathy or petechiae. Mr. his review of system is unremarkable  Medications: I  have reviewed the patient's current medications.  Current Outpatient Prescriptions  Medication Sig Dispense Refill  . aspirin EC 325 MG EC tablet Take 1 tablet (325 mg total) by mouth daily with breakfast. 30 tablet 0  . buPROPion (WELLBUTRIN XL) 150 MG 24 hr tablet Take 150 mg by mouth every morning.     . Cholecalciferol (VITAMIN D3) 1000 UNITS CAPS Take 1,000 Units by mouth every morning.     . fenofibrate 160 MG tablet Take 160 mg by mouth every morning.     . ferrous sulfate 325 (65 FE) MG tablet Take 325 mg by mouth daily with breakfast.    . FLUoxetine (PROZAC) 20 MG capsule Take 60 mg by mouth daily.    Marland Kitchen glucose blood test strip 1 each by Other route 4 (four) times daily -  before meals and at bedtime. Use as instructed    . HYDROcodone-acetaminophen (NORCO/VICODIN) 5-325 MG per tablet Take 1 tablet by mouth every 6 (six) hours as needed for moderate pain. 30 tablet 0  . insulin aspart (NOVOLOG) 100 UNIT/ML injection Inject 0-9 Units into the skin 3 (three) times daily with meals. 10 mL 11  . insulin glargine (LANTUS) 100 UNIT/ML injection Inject 36 Units into the skin 2 (two) times daily.     Marland Kitchen lamoTRIgine (LAMICTAL) 25 MG tablet Take 50 mg by mouth 2 (two) times daily.    Marland Kitchen levothyroxine (SYNTHROID, LEVOTHROID) 25 MCG tablet Take 25 mcg by mouth daily.    Marland Kitchen lisinopril (PRINIVIL,ZESTRIL) 5 MG tablet Take 5 mg by mouth daily.    . meclizine (ANTIVERT) 25 MG tablet Take 25 mg by mouth daily.     . Melatonin 5 MG  TABS Take 10 mg by mouth daily at 8 pm.    . methocarbamol (ROBAXIN) 500 MG tablet Take 1 tablet (500 mg total) by mouth every 6 (six) hours as needed for muscle spasms. 30 tablet 0  . Multiple Vitamin (DAILY VITE) TABS Take 1 tablet by mouth every morning.     Marland Kitchen omeprazole (PRILOSEC) 20 MG capsule Take 20 mg by mouth every morning.     . polyethylene glycol (MIRALAX / GLYCOLAX) packet Take 17 g by mouth daily as needed for mild constipation.     . simvastatin (ZOCOR) 40 MG  tablet Take 40 mg by mouth at bedtime.     . tamsulosin (FLOMAX) 0.4 MG CAPS capsule Take 0.4 mg by mouth daily.     . traZODone (DESYREL) 50 MG tablet Take 75 mg by mouth at bedtime.     . Wheat Dextrin (BENEFIBER) POWD Take 2 mLs by mouth every morning. Mix in 4 oz of water and drink    . [DISCONTINUED] zolpidem (AMBIEN) 5 MG tablet Take 5 mg by mouth at bedtime as needed.     No current facility-administered medications for this visit.     Allergies:  Allergies  Allergen Reactions  . Penicillins Hives    Past Medical History, Surgical history, Social history, and Family History were reviewed and updated.   Physical Exam: Blood pressure 104/45, pulse 71, temperature 98 F (36.7 C), temperature source Oral, resp. rate 18, height 5' 6" (1.676 m), weight 171 lb 6.4 oz (77.747 kg). ECOG: 1 General appearance: alert awake not in any distress. Head: Normocephalic, without obvious abnormality Neck: no adenopathy Lymph nodes: Cervical, supraclavicular, and axillary nodes normal. Heart:regular rate and rhythm, S1, S2. Lung:chest clear, no wheezing, rales, normal symmetric air entry Abdomin: soft, non-tender, without masses or organomegaly. Slightly distended but cannot appreciate any hepatosplenomegaly. He does have good bowel sounds. EXT:no erythema, induration, or nodules Skin: No rashes or lesions.  Lab Results: Lab Results  Component Value Date   WBC 3.1* 01/23/2015   HGB 9.4* 01/23/2015   HCT 29.9* 01/23/2015   MCV 90.0 01/23/2015   PLT 148 01/23/2015     Chemistry      Component Value Date/Time   NA 140 01/09/2015 0510   NA 141 09/26/2014 1127   K 4.3 01/09/2015 0510   K 4.8 09/26/2014 1127   CL 113* 01/09/2015 0510   CO2 22 01/09/2015 0510   CO2 23 09/26/2014 1127   BUN 43* 01/09/2015 0510   BUN 29.5* 09/26/2014 1127   CREATININE 1.27 01/09/2015 0510   CREATININE 1.6* 09/26/2014 1127      Component Value Date/Time   CALCIUM 7.7* 01/09/2015 0510   CALCIUM 9.6  09/26/2014 1127   ALKPHOS 42 09/26/2014 1127   ALKPHOS 64 01/04/2014 1631   AST 30 09/26/2014 1127   AST 52* 01/04/2014 1631   ALT 17 09/26/2014 1127   ALT 39 01/04/2014 1631   BILITOT 0.49 09/26/2014 1127   BILITOT 0.4 01/04/2014 1631       Impression and Plan:  69 year old with the following issues:   1. Thrombocytopenia: His count today is actually improved back to normal. His bone marrow biopsy failed to show any hematological conditions in August of 2014. There is no evidence of myelodysplasia or myeloproliferative disorder to explain his cytopenia. Most likely etiology at this time would be due to due to liver cirrhosis.   2. Splenomegaly: The CT scan on the bone marrow biopsy results do not  suggest the presence of a lymphoproliferative disorder. 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 No evidence of progression at this time.  3. Anemia: His hemoglobin is 9.4 today and certainly has not required transfusions. His hemoglobin might have declined further related to his recent surgery. We will continue to monitor his counts on a regular basis. I will repeat it again in about 5 months. We can certainly consider growth factor support in the future if needed to.  [facility], MD 2/11/20161:44 PM

## 2015-01-29 ENCOUNTER — Other Ambulatory Visit: Payer: Self-pay

## 2015-01-29 DIAGNOSIS — D539 Nutritional anemia, unspecified: Secondary | ICD-10-CM

## 2015-01-29 MED ORDER — HYDROCODONE-ACETAMINOPHEN 5-325 MG PO TABS: 1.0000 | ORAL_TABLET | Freq: Four times a day (QID) | ORAL | Status: DC | PRN

## 2015-01-29 NOTE — Telephone Encounter (Signed)
Faxed to [facility] Fax Number: [phone], Phone Number [phone]  

## 2015-02-11 ENCOUNTER — Non-Acute Institutional Stay (SKILLED_NURSING_FACILITY): Payer: Medicare Other | Admitting: Nurse Practitioner

## 2015-02-11 DIAGNOSIS — E034 Atrophy of thyroid (acquired): Secondary | ICD-10-CM

## 2015-02-11 DIAGNOSIS — N183 Chronic kidney disease, stage 3 unspecified: Secondary | ICD-10-CM

## 2015-02-11 DIAGNOSIS — R339 Retention of urine, unspecified: Secondary | ICD-10-CM | POA: Diagnosis not present

## 2015-02-11 DIAGNOSIS — S72002S Fracture of unspecified part of neck of left femur, sequela: Secondary | ICD-10-CM | POA: Diagnosis not present

## 2015-02-11 DIAGNOSIS — E038 Other specified hypothyroidism: Secondary | ICD-10-CM | POA: Diagnosis not present

## 2015-02-11 DIAGNOSIS — E118 Type 2 diabetes mellitus with unspecified complications: Secondary | ICD-10-CM | POA: Diagnosis not present

## 2015-02-11 DIAGNOSIS — I10 Essential (primary) hypertension: Secondary | ICD-10-CM | POA: Diagnosis not present

## 2015-02-11 NOTE — Progress Notes (Signed)
Patient ID: [name], male   DOB: 07-26-1946, 68 y.o.   MRN: [id number]    Nursing Home Location:  [facility]   Place of Service: SNF (31)  PCP: Florentina Jenny, MD  Allergies  Allergen Reactions  . Penicillins Hives    Chief Complaint  Patient presents with  . Medical Management of Chronic Issues    HPI:  Patient is a 69 y.o. male seen today at [facility] for routine follow up on chronic conditions. Pt currently at [facility] for ongoing rehab after hospitalization for left hip intertrochanteric s/p Intramedullary nail on 1/27. Pt doing well with rehab. Reports minimal pain. Pt with hx of urinary retention in hospital but now without complaints voiding without problem. Mood has been stable. No concerns from nursing.   Review of Systems:  Review of Systems  Constitutional: Negative for activity change, appetite change, fatigue and unexpected weight change.  HENT: Negative for congestion and hearing loss.   Eyes: Negative.   Respiratory: Negative for cough and shortness of breath.   Cardiovascular: Negative for chest pain, palpitations and leg swelling.  Gastrointestinal: Negative for abdominal pain, diarrhea and constipation.  Genitourinary: Negative for dysuria and difficulty urinating.  Musculoskeletal: Negative for myalgias and arthralgias.  Skin: Negative for color change and wound.  Neurological: Negative for dizziness and weakness.  Psychiatric/Behavioral: Negative for behavioral problems, confusion and agitation.    Past Medical History  Diagnosis Date  . Hyperglycemia   . Hyponatremia   . DMII (diabetes mellitus, type 2)   . Depression   . IBS (irritable bowel syndrome)   . Chest pain   . Unspecified gastritis and gastroduodenitis without mention of hemorrhage   . Arthropathy, unspecified, site unspecified   . History of colon polyps   . Anemia, unspecified   . Anxiety   . GERD (gastroesophageal reflux disease)   .  Hyperlipidemia   . Hypertension    Past Surgical History  Procedure Laterality Date  . Hernia repair  1964  . Intramedullary (im) nail intertrochanteric Left 01/07/2015    Procedure: INTRAMEDULLARY (IM) NAIL INTERTROCHANTRIC;  Surgeon: Kathryne Hitch, MD;  Location: [facility];  Service: Orthopedics;  Laterality: Left;   Social History:   reports that he has quit smoking. His smoking use included Cigarettes. He smoked 1.00 pack per day. He does not have any smokeless tobacco history on file. He reports that he does not drink alcohol or use illicit drugs.  Family History  Problem Relation Age of Onset  . Stroke Mother   . Heart attack Father   . Heart disease Father   . Cancer Other     FH of Breast Cancer-other Relative  . Heart disease Other     Parent  . Hypertension Other     parent  . Cancer Maternal Uncle     colon    Medications: Patient's Medications  New Prescriptions   No medications on file  Previous Medications   ASPIRIN EC 325 MG EC TABLET    Take 1 tablet (325 mg total) by mouth daily with breakfast.   BUPROPION (WELLBUTRIN XL) 150 MG 24 HR TABLET    Take 150 mg by mouth every morning.    CHOLECALCIFEROL (VITAMIN D3) 1000 UNITS CAPS    Take 1,000 Units by mouth every morning.    FENOFIBRATE 160 MG TABLET    Take 160 mg by mouth every morning.    FERROUS SULFATE 325 (65 FE) MG TABLET  Take 325 mg by mouth daily with breakfast.   FLUOXETINE (PROZAC) 20 MG CAPSULE    Take 60 mg by mouth daily.   GLUCOSE BLOOD TEST STRIP    1 each by Other route 4 (four) times daily -  before meals and at bedtime. Use as instructed   HYDROCODONE-ACETAMINOPHEN (NORCO/VICODIN) 5-325 MG PER TABLET    Take 1 tablet by mouth every 6 (six) hours as needed for moderate pain.   INSULIN ASPART (NOVOLOG) 100 UNIT/ML INJECTION    Inject 0-9 Units into the skin 3 (three) times daily with meals.   INSULIN GLARGINE (LANTUS) 100 UNIT/ML INJECTION    Inject 36 Units into the skin 2 (two) times  daily.    LAMOTRIGINE (LAMICTAL) 25 MG TABLET    Take 50 mg by mouth 2 (two) times daily.   LEVOTHYROXINE (SYNTHROID, LEVOTHROID) 25 MCG TABLET    Take 25 mcg by mouth daily.   LISINOPRIL (PRINIVIL,ZESTRIL) 5 MG TABLET    Take 5 mg by mouth daily.   MECLIZINE (ANTIVERT) 25 MG TABLET    Take 25 mg by mouth daily.    MELATONIN 5 MG TABS    Take 10 mg by mouth daily at 8 pm.   METHOCARBAMOL (ROBAXIN) 500 MG TABLET    Take 1 tablet (500 mg total) by mouth every 6 (six) hours as needed for muscle spasms.   MULTIPLE VITAMIN (DAILY VITE) TABS    Take 1 tablet by mouth every morning.    OMEPRAZOLE (PRILOSEC) 20 MG CAPSULE    Take 20 mg by mouth every morning.    POLYETHYLENE GLYCOL (MIRALAX / GLYCOLAX) PACKET    Take 17 g by mouth daily as needed for mild constipation.    SIMVASTATIN (ZOCOR) 40 MG TABLET    Take 40 mg by mouth at bedtime.    TAMSULOSIN (FLOMAX) 0.4 MG CAPS CAPSULE    Take 0.4 mg by mouth daily.    TRAZODONE (DESYREL) 50 MG TABLET    Take 75 mg by mouth at bedtime.    WHEAT DEXTRIN (BENEFIBER) POWD    Take 2 mLs by mouth every morning. Mix in 4 oz of water and drink  Modified Medications   No medications on file  Discontinued Medications   No medications on file     Physical Exam: Filed Vitals:   02/11/15 1435  BP: 120/60  Pulse: 63  Temp: 97.2 F (36.2 C)  Resp: 20  Weight: 160 lb (72.576 kg)    Physical Exam  Constitutional: He is oriented to person, place, and time. He appears well-developed and well-nourished. No distress.  HENT:  Head: Normocephalic and atraumatic.  Mouth/Throat: Oropharynx is clear and moist. No oropharyngeal exudate.  Eyes: Conjunctivae and EOM are normal. Pupils are equal, round, and reactive to light.  Neck: Normal range of motion. Neck supple.  Cardiovascular: Normal rate, regular rhythm and normal heart sounds.   Pulmonary/Chest: Effort normal and breath sounds normal.  Abdominal: Soft. Bowel sounds are normal.  Musculoskeletal: He  exhibits no edema or tenderness.  Neurological: He is alert and oriented to person, place, and time.  Skin: Skin is warm and dry. He is not diaphoretic.  Well healed left hip incision   Psychiatric: He has a normal mood and affect.    Labs reviewed: Basic Metabolic Panel:  Recent Labs  16/09/9600/27/16 0459 01/08/15 1248 01/09/15 0510 01/23/15 1313  NA 137 138 140 138  K 5.3* 4.9 4.3 4.6  CL 110 112 113*  --  CO2 GLUCOSE 279* 268* 101* 224*  BUN 34* 32* 43* 31.2*  CREATININE 1.42* 1.42* 1.27 1.2  CALCIUM 7.7* 7.7* 7.7* 9.1   Liver Function Tests:  Recent Labs  05/29/14 0828 09/26/14 1127 01/23/15 1313  AST 37* 30 57*  ALT ALKPHOS 53 42 144  BILITOT 0.43 0.49 0.90  PROT 8.2 7.7 7.7  ALBUMIN 3.3* 3.3* 2.7*    Recent Labs  01/07/15 0545  LIPASE 26   No results for input(s): AMMONIA in the last 8760 hours. CBC:  Recent Labs  09/26/14 1127 01/07/15 0015  01/09/15 0510 01/10/15 0528 01/23/15 1313  WBC 2.0* 1.9*  < > 2.6* 2.2* 3.1*  NEUTROABS 1.3* 1.5*  --   --   --  2.4  HGB 9.0* 8.9*  < > 8.1* 7.9* 9.4*  HCT 28.5* 27.7*  < > 24.7* 23.6* 29.9*  MCV 96.8 91.1  < > 91.1 90.4 90.0  PLT 86* 72*  < > 63* 61* 148  < > = values in this interval not displayed. TSH:  Recent Labs  01/07/15 0545  TSH 1.728   A1C: Lab Results  Component Value Date   HGBA1C 6.3* 01/07/2015   Lipid Panel: No results for input(s): CHOL, HDL, LDLCALC, TRIG, CHOLHDL, LDLDIRECT in the last 8760 hours.  Assessment/Plan 1. Hypothyroidism due to acquired atrophy of thyroid -TSH stable in January.   2. DM (diabetes mellitus), type 2 with complications Blood sugars reviewed and in appropriate range, will cont current regimen   3. Hip fracture, left, sequela -doing well with rehab, minimal pain  4. Urinary retention Voiding without difficulty at this time  5. CKD (chronic kidney disease) stage 3, GFR 30-59 ml/min BUN and CR stable upon dc from  hospital  6. Essential hypertension -controlled on current regimen

## 2015-03-14 ENCOUNTER — Non-Acute Institutional Stay (SKILLED_NURSING_FACILITY): Payer: Medicare Other | Admitting: Nurse Practitioner

## 2015-03-14 DIAGNOSIS — E038 Other specified hypothyroidism: Secondary | ICD-10-CM

## 2015-03-14 DIAGNOSIS — K219 Gastro-esophageal reflux disease without esophagitis: Secondary | ICD-10-CM | POA: Diagnosis not present

## 2015-03-14 DIAGNOSIS — E118 Type 2 diabetes mellitus with unspecified complications: Secondary | ICD-10-CM

## 2015-03-14 DIAGNOSIS — F329 Major depressive disorder, single episode, unspecified: Secondary | ICD-10-CM | POA: Diagnosis not present

## 2015-03-14 DIAGNOSIS — I10 Essential (primary) hypertension: Secondary | ICD-10-CM | POA: Diagnosis not present

## 2015-03-14 DIAGNOSIS — N183 Chronic kidney disease, stage 3 unspecified: Secondary | ICD-10-CM

## 2015-03-14 DIAGNOSIS — E034 Atrophy of thyroid (acquired): Secondary | ICD-10-CM

## 2015-03-14 DIAGNOSIS — E785 Hyperlipidemia, unspecified: Secondary | ICD-10-CM | POA: Diagnosis not present

## 2015-03-14 DIAGNOSIS — F32A Depression, unspecified: Secondary | ICD-10-CM

## 2015-03-14 DIAGNOSIS — F419 Anxiety disorder, unspecified: Secondary | ICD-10-CM | POA: Diagnosis not present

## 2015-03-14 NOTE — Progress Notes (Signed)
Patient ID: [name], male   DOB: 09/05/1946, 69 y.o.   MRN: [id number]    Nursing Home Location:  [facility]   Place of Service: SNF (31)  PCP: Florentina Jenny, MD  Allergies  Allergen Reactions  . Penicillins Hives    Chief Complaint  Patient presents with  . Medical Management of Chronic Issues  . Acute Visit    fall last night    HPI:  Patient is a 69 y.o. male seen today at [facility] for routine follow up on chronic conditions and for an acute visit following a fall last night. Pt currently at [facility] for ongoing rehab after hospitalization for left hip intertrochanteric s/p Intramedullary nail on 1/27. He has a past medical history of hypertension,diabetes, hypothyroidism, CKD, and depression.   Last night he got up to use the bathroom and he said he lost his balance.  He fell and hit the back of his head on the foot board of his bed.  He says he felt dizzy before the fall.  Today he says his head hurts, but is not dizzy today. No hematoma present, no tenderness.  He also complains of a cough today. He says cough began 2 years ago.  Cough is dry.  No rhinorrhea, no sore throat.  Says he feels well.    Review of Systems:  Review of Systems  Constitutional: Negative for activity change, appetite change, fatigue and unexpected weight change.  HENT: Negative for congestion, hearing loss and sore throat.   Eyes: Negative.   Respiratory: Positive for cough. Negative for shortness of breath.   Cardiovascular: Negative for chest pain, palpitations and leg swelling.  Gastrointestinal: Negative for abdominal pain, diarrhea and constipation.  Genitourinary: Negative for dysuria, urgency, flank pain and difficulty urinating.  Musculoskeletal: Negative for myalgias and arthralgias.  Skin: Negative for color change, pallor, rash and wound.  Neurological: Positive for dizziness. Negative for weakness, light-headedness and headaches.    Psychiatric/Behavioral: Negative for behavioral problems, confusion and agitation. The patient is not nervous/anxious.     Past Medical History  Diagnosis Date  . Hyperglycemia   . Hyponatremia   . DMII (diabetes mellitus, type 2)   . Depression   . IBS (irritable bowel syndrome)   . Chest pain   . Unspecified gastritis and gastroduodenitis without mention of hemorrhage   . Arthropathy, unspecified, site unspecified   . History of colon polyps   . Anemia, unspecified   . Anxiety   . GERD (gastroesophageal reflux disease)   . Hyperlipidemia   . Hypertension    Past Surgical History  Procedure Laterality Date  . Hernia repair  1964  . Intramedullary (im) nail intertrochanteric Left 01/07/2015    Procedure: INTRAMEDULLARY (IM) NAIL INTERTROCHANTRIC;  Surgeon: Kathryne Hitch, MD;  Location: [facility];  Service: Orthopedics;  Laterality: Left;   Social History:   reports that he has quit smoking. His smoking use included Cigarettes. He smoked 1.00 pack per day. He does not have any smokeless tobacco history on file. He reports that he does not drink alcohol or use illicit drugs.  Family History  Problem Relation Age of Onset  . Stroke Mother   . Heart attack Father   . Heart disease Father   . Cancer Other     FH of Breast Cancer-other Relative  . Heart disease Other     Parent  . Hypertension Other     parent  . Cancer Maternal Uncle  colon    Medications: Patient's Medications  New Prescriptions   No medications on file  Previous Medications   ASPIRIN EC 325 MG EC TABLET    Take 1 tablet (325 mg total) by mouth daily with breakfast.   BUPROPION (WELLBUTRIN XL) 150 MG 24 HR TABLET    Take 150 mg by mouth every morning.    CHOLECALCIFEROL (VITAMIN D3) 1000 UNITS CAPS    Take 1,000 Units by mouth every morning.    FENOFIBRATE 160 MG TABLET    Take 160 mg by mouth every morning.    FERROUS SULFATE 325 (65 FE) MG TABLET    Take 325 mg by mouth daily with  breakfast.   FLUOXETINE (PROZAC) 20 MG CAPSULE    Take 60 mg by mouth daily.   GLUCOSE BLOOD TEST STRIP    1 each by Other route 4 (four) times daily -  before meals and at bedtime. Use as instructed   HYDROCODONE-ACETAMINOPHEN (NORCO/VICODIN) 5-325 MG PER TABLET    Take 1 tablet by mouth every 6 (six) hours as needed for moderate pain.   INSULIN ASPART (NOVOLOG) 100 UNIT/ML INJECTION    Inject 0-9 Units into the skin 3 (three) times daily with meals.   INSULIN GLARGINE (LANTUS) 100 UNIT/ML INJECTION    Inject 36 Units into the skin 2 (two) times daily.    LAMOTRIGINE (LAMICTAL) 25 MG TABLET    Take 50 mg by mouth 2 (two) times daily.   LEVOTHYROXINE (SYNTHROID, LEVOTHROID) 25 MCG TABLET    Take 25 mcg by mouth daily.   LISINOPRIL (PRINIVIL,ZESTRIL) 5 MG TABLET    Take 5 mg by mouth daily.   MECLIZINE (ANTIVERT) 25 MG TABLET    Take 25 mg by mouth daily.    MELATONIN 5 MG TABS    Take 10 mg by mouth daily at 8 pm.   METHOCARBAMOL (ROBAXIN) 500 MG TABLET    Take 1 tablet (500 mg total) by mouth every 6 (six) hours as needed for muscle spasms.   MULTIPLE VITAMIN (DAILY VITE) TABS    Take 1 tablet by mouth every morning.    OMEPRAZOLE (PRILOSEC) 20 MG CAPSULE    Take 20 mg by mouth every morning.    POLYETHYLENE GLYCOL (MIRALAX / GLYCOLAX) PACKET    Take 17 g by mouth daily as needed for mild constipation.    SIMVASTATIN (ZOCOR) 40 MG TABLET    Take 40 mg by mouth at bedtime.    TAMSULOSIN (FLOMAX) 0.4 MG CAPS CAPSULE    Take 0.4 mg by mouth daily.    TRAZODONE (DESYREL) 50 MG TABLET    Take 75 mg by mouth at bedtime.    WHEAT DEXTRIN (BENEFIBER) POWD    Take 2 mLs by mouth every morning. Mix in 4 oz of water and drink  Modified Medications   No medications on file  Discontinued Medications   No medications on file     Physical Exam: Filed Vitals:   03/14/15 0948  BP: 140/70  Pulse: 73  Temp: 98.2 F (36.8 C)  Resp: 16    Physical Exam  Constitutional: He is oriented to person,  place, and time. He appears well-developed and well-nourished. No distress.  HENT:  Head: Normocephalic and atraumatic. Head is without contusion and without laceration.  Mouth/Throat: Oropharynx is clear and moist. No oropharyngeal exudate.  No hematoma present, no tenderness on palpation.    Eyes: Conjunctivae and EOM are normal. Pupils are equal, round, and reactive to  light.  Neck: Normal range of motion. Neck supple.  Cardiovascular: Normal rate, regular rhythm and normal heart sounds.   Pulmonary/Chest: Effort normal and breath sounds normal.  Abdominal: Soft. Bowel sounds are normal.  Musculoskeletal: He exhibits no edema or tenderness.  Lymphadenopathy:    He has no cervical adenopathy.  Neurological: He is alert and oriented to person, place, and time. He has normal strength. No cranial nerve deficit. Coordination normal. GCS eye subscore is 4. GCS verbal subscore is 5. GCS motor subscore is 6.  Skin: Skin is warm and dry. He is not diaphoretic.  Psychiatric: He has a normal mood and affect.    Labs reviewed: Basic Metabolic Panel:  Recent Labs  96/04/54 0459 01/08/15 1248 01/09/15 0510 01/23/15 1313  NA 137 138 140 138  K 5.3* 4.9 4.3 4.6  CL 110 112 113*  --   CO2 20 22 22 24   GLUCOSE 279* 268* 101* 224*  BUN 34* 32* 43* 31.2*  CREATININE 1.42* 1.42* 1.27 1.2  CALCIUM 7.7* 7.7* 7.7* 9.1   Liver Function Tests:  Recent Labs  05/29/14 0828 09/26/14 1127 01/23/15 1313  AST 37* 30 57*  ALT 20 17 26   ALKPHOS 53 42 144  BILITOT 0.43 0.49 0.90  PROT 8.2 7.7 7.7  ALBUMIN 3.3* 3.3* 2.7*    Recent Labs  01/07/15 0545  LIPASE 26   No results for input(s): AMMONIA in the last 8760 hours. CBC:  Recent Labs  09/26/14 1127 01/07/15 0015  01/09/15 0510 01/10/15 0528 01/23/15 1313  WBC 2.0* 1.9*  < > 2.6* 2.2* 3.1*  NEUTROABS 1.3* 1.5*  --   --   --  2.4  HGB 9.0* 8.9*  < > 8.1* 7.9* 9.4*  HCT 28.5* 27.7*  < > 24.7* 23.6* 29.9*  MCV 96.8 91.1  < > 91.1  90.4 90.0  PLT 86* 72*  < > 63* 61* 148  < > = values in this interval not displayed. TSH:  Recent Labs  01/07/15 0545  TSH 1.728   A1C: Lab Results  Component Value Date   HGBA1C 6.3* 01/07/2015   Lipid Panel: No results for input(s): CHOL, HDL, LDLCALC, TRIG, CHOLHDL, LDLDIRECT in the last 8760 hours.  Assessment/Plan 1. Essential hypertension Maintained on lisinopril.  Blood pressures remain in desirable range.   Will change ACE to ARB due to cough, see # 9  2. Gastroesophageal reflux disease without esophagitis Maintained on omeprazole, no complaints at this time.    3. DM (diabetes mellitus), type 2 with complications Morning CBG's are sometimes low 60-70, otherwise at goal. Will D/C Novolog HS coverage and ensure that patient is getting his night time snack.  Last A1c was 6.3 in late January.  Weight stable.  Will follow up A1c in May.    4. Hypothyroidism due to acquired atrophy of thyroid TSH stable in January.  Will continue same dose of levothyroxine.   5. CKD (chronic kidney disease) stage 3, GFR 30-59 ml/min BUN/ Cr stable in February.   6. Depression Stable on lamictal and fluoxetine.  Patient says he is eating and drinking well.    7. Anxiety Well controlled, Patient is not anxious today.  Appears calm.    8. Hyperlipidemia Takes simvastatin, will continue   9. Cough  Ongoing cough, Mucinex DM not helping, will follow up chest Xray -Cough could be due to ACE inhibitor.  Will switch patient to losartan 25mg  daily and follow up in one month.    10.  Vertigo Patient takes Meclizine 25mg  daily.  Will refer to PT for vestibular rehab given patient has had a recent fall due to his vertigo which appears to be due to BPPV.

## 2015-03-18 ENCOUNTER — Non-Acute Institutional Stay (SKILLED_NURSING_FACILITY): Payer: Medicare Other | Admitting: Nurse Practitioner

## 2015-03-18 DIAGNOSIS — J329 Chronic sinusitis, unspecified: Secondary | ICD-10-CM | POA: Diagnosis not present

## 2015-03-18 NOTE — Progress Notes (Signed)
Patient ID: [name], male   DOB: Jan 29, 1946, 69 y.o.   MRN: [id number]    Nursing Home Location:  [facility]   Place of Service: SNF (31)  PCP: Florentina Jenny, MD  Allergies  Allergen Reactions  . Penicillins Hives    Chief Complaint  Patient presents with  . Acute Visit    HPI:  Patient is a 69 y.o. male seen today at [facility] per nursing request. He has a past medical history of hypertension,diabetes, hypothyroidism, CKD, and depression.  Pt with cough and congestion for over a week. Chest xray obtain which was negitive.   Over the weekend temp of 99 noted. Pt reports he conts to feel bad. Pt complains of chest congestion but also admits to feeling stuffed up and hard to breath through his nose. Nursing reports increase sinus congestion. Pt also had another fall was witnessed by staff. No injury noted.  Review of Systems:  Review of Systems  Constitutional: Positive for fatigue. Negative for activity change, appetite change and unexpected weight change.  HENT: Positive for postnasal drip, rhinorrhea and sinus pressure. Negative for congestion, hearing loss and sore throat.   Eyes: Negative.   Respiratory: Positive for cough. Negative for shortness of breath.   Cardiovascular: Negative for chest pain, palpitations and leg swelling.  Gastrointestinal: Negative for abdominal pain, diarrhea and constipation.  Genitourinary: Negative for dysuria, urgency, flank pain and difficulty urinating.  Musculoskeletal: Negative for myalgias and arthralgias.  Skin: Negative for color change, pallor, rash and wound.  Neurological: Positive for dizziness. Negative for weakness, light-headedness and headaches.  Psychiatric/Behavioral: Negative for behavioral problems, confusion and agitation. The patient is not nervous/anxious.     Past Medical History  Diagnosis Date  . Hyperglycemia   . Hyponatremia   . DMII (diabetes mellitus, type 2)   . Depression    . IBS (irritable bowel syndrome)   . Chest pain   . Unspecified gastritis and gastroduodenitis without mention of hemorrhage   . Arthropathy, unspecified, site unspecified   . History of colon polyps   . Anemia, unspecified   . Anxiety   . GERD (gastroesophageal reflux disease)   . Hyperlipidemia   . Hypertension    Past Surgical History  Procedure Laterality Date  . Hernia repair  1964  . Intramedullary (im) nail intertrochanteric Left 01/07/2015    Procedure: INTRAMEDULLARY (IM) NAIL INTERTROCHANTRIC;  Surgeon: Kathryne Hitch, MD;  Location: [facility];  Service: Orthopedics;  Laterality: Left;   Social History:   reports that he has quit smoking. His smoking use included Cigarettes. He smoked 1.00 pack per day. He does not have any smokeless tobacco history on file. He reports that he does not drink alcohol or use illicit drugs.  Family History  Problem Relation Age of Onset  . Stroke Mother   . Heart attack Father   . Heart disease Father   . Cancer Other     FH of Breast Cancer-other Relative  . Heart disease Other     Parent  . Hypertension Other     parent  . Cancer Maternal Uncle     colon    Medications: Patient's Medications  New Prescriptions   No medications on file  Previous Medications   ASPIRIN EC 325 MG EC TABLET    Take 1 tablet (325 mg total) by mouth daily with breakfast.   BUPROPION (WELLBUTRIN XL) 150 MG 24 HR TABLET    Take 150 mg by  mouth every morning.    CHOLECALCIFEROL (VITAMIN D3) 1000 UNITS CAPS    Take 1,000 Units by mouth every morning.    FENOFIBRATE 160 MG TABLET    Take 160 mg by mouth every morning.    FERROUS SULFATE 325 (65 FE) MG TABLET    Take 325 mg by mouth daily with breakfast.   FLUOXETINE (PROZAC) 20 MG CAPSULE    Take 60 mg by mouth daily.   GLUCOSE BLOOD TEST STRIP    1 each by Other route 4 (four) times daily -  before meals and at bedtime. Use as instructed   HYDROCODONE-ACETAMINOPHEN (NORCO/VICODIN) 5-325 MG PER  TABLET    Take 1 tablet by mouth every 6 (six) hours as needed for moderate pain.   INSULIN ASPART (NOVOLOG) 100 UNIT/ML INJECTION    Inject 0-9 Units into the skin 3 (three) times daily with meals.   INSULIN GLARGINE (LANTUS) 100 UNIT/ML INJECTION    Inject 36 Units into the skin 2 (two) times daily.    LAMOTRIGINE (LAMICTAL) 25 MG TABLET    Take 50 mg by mouth 2 (two) times daily.   LEVOTHYROXINE (SYNTHROID, LEVOTHROID) 25 MCG TABLET    Take 25 mcg by mouth daily.   LISINOPRIL (PRINIVIL,ZESTRIL) 5 MG TABLET    Take 5 mg by mouth daily.   LOSARTAN (COZAAR) 25 MG TABLET    Take 25 mg by mouth daily.   MELATONIN 5 MG TABS    Take 10 mg by mouth daily at 8 pm.   METHOCARBAMOL (ROBAXIN) 500 MG TABLET    Take 1 tablet (500 mg total) by mouth every 6 (six) hours as needed for muscle spasms.   MULTIPLE VITAMIN (DAILY VITE) TABS    Take 1 tablet by mouth every morning.    OMEPRAZOLE (PRILOSEC) 20 MG CAPSULE    Take 20 mg by mouth every morning.    POLYETHYLENE GLYCOL (MIRALAX / GLYCOLAX) PACKET    Take 17 g by mouth daily as needed for mild constipation.    SIMVASTATIN (ZOCOR) 40 MG TABLET    Take 40 mg by mouth at bedtime.    TAMSULOSIN (FLOMAX) 0.4 MG CAPS CAPSULE    Take 0.4 mg by mouth daily.    TRAZODONE (DESYREL) 50 MG TABLET    Take 75 mg by mouth at bedtime.    WHEAT DEXTRIN (BENEFIBER) POWD    Take 2 mLs by mouth every morning. Mix in 4 oz of water and drink  Modified Medications   No medications on file  Discontinued Medications   MECLIZINE (ANTIVERT) 25 MG TABLET    Take 25 mg by mouth daily.      Physical Exam: Filed Vitals:   03/18/15 1542  BP: 138/58  Pulse: 76  Temp: 99.5 F (37.5 C)  Resp: 20    Physical Exam  Constitutional: He is oriented to person, place, and time. He appears well-developed and well-nourished. No distress.  HENT:  Head: Normocephalic and atraumatic. Head is without contusion and without laceration.  Right Ear: External ear normal.  Left Ear: External  ear normal.  Nose: Nose normal.  Mouth/Throat: Oropharynx is clear and moist. No oropharyngeal exudate.  Eyes: Conjunctivae and EOM are normal. Pupils are equal, round, and reactive to light.  Neck: Normal range of motion. Neck supple.  Cardiovascular: Normal rate, regular rhythm and normal heart sounds.   Pulmonary/Chest: Effort normal and breath sounds normal.  Musculoskeletal: He exhibits no edema or tenderness.  Lymphadenopathy:    He  has no cervical adenopathy.  Neurological: He is alert and oriented to person, place, and time. He has normal strength.  Skin: Skin is warm and dry. He is not diaphoretic.  Psychiatric: He has a normal mood and affect.    Labs reviewed: Basic Metabolic Panel:  Recent Labs  78/29/56 0459 01/08/15 1248 01/09/15 0510 01/23/15 1313  NA 137 138 140 138  K 5.3* 4.9 4.3 4.6  CL 110 112 113*  --   CO2 GLUCOSE 279* 268* 101* 224*  BUN 34* 32* 43* 31.2*  CREATININE 1.42* 1.42* 1.27 1.2  CALCIUM 7.7* 7.7* 7.7* 9.1   Liver Function Tests:  Recent Labs  05/29/14 0828 09/26/14 1127 01/23/15 1313  AST 37* 30 57*  ALT ALKPHOS 53 42 144  BILITOT 0.43 0.49 0.90  PROT 8.2 7.7 7.7  ALBUMIN 3.3* 3.3* 2.7*    Recent Labs  01/07/15 0545  LIPASE 26   No results for input(s): AMMONIA in the last 8760 hours. CBC:  Recent Labs  09/26/14 1127 01/07/15 0015  01/09/15 0510 01/10/15 0528 01/23/15 1313  WBC 2.0* 1.9*  < > 2.6* 2.2* 3.1*  NEUTROABS 1.3* 1.5*  --   --   --  2.4  HGB 9.0* 8.9*  < > 8.1* 7.9* 9.4*  HCT 28.5* 27.7*  < > 24.7* 23.6* 29.9*  MCV 96.8 91.1  < > 91.1 90.4 90.0  PLT 86* 72*  < > 63* 61* 148  < > = values in this interval not displayed. TSH:  Recent Labs  01/07/15 0545  TSH 1.728   A1C: Lab Results  Component Value Date   HGBA1C 6.3* 01/07/2015   Lipid Panel: No results for input(s): CHOL, HDL, LDLCALC, TRIG, CHOLHDL, LDLDIRECT in the last 8760 hours.  Assessment/Plan  1. Sinusitis,  unspecified chronicity, unspecified location -doxycyline 100 mg PO BID for 7 days

## 2015-03-24 ENCOUNTER — Emergency Department (HOSPITAL_COMMUNITY): Payer: Medicare Other

## 2015-03-24 ENCOUNTER — Inpatient Hospital Stay (HOSPITAL_COMMUNITY): Payer: Medicare Other | Admitting: Anesthesiology

## 2015-03-24 ENCOUNTER — Inpatient Hospital Stay (HOSPITAL_COMMUNITY): Payer: Medicare Other

## 2015-03-24 ENCOUNTER — Encounter (HOSPITAL_COMMUNITY)
Admission: EM | Disposition: A | Discharge status: Skilled Nursing Facility | Payer: Self-pay | Source: Home / Self Care | Attending: Family Medicine

## 2015-03-24 ENCOUNTER — Other Ambulatory Visit (HOSPITAL_COMMUNITY): Payer: Self-pay

## 2015-03-24 ENCOUNTER — Inpatient Hospital Stay (HOSPITAL_COMMUNITY)
Admission: EM | Admit: 2015-03-24 | Discharge: 2015-03-27 | DRG: 481 | Disposition: A | Discharge status: Skilled Nursing Facility | Payer: Medicare Other | Attending: Family Medicine | Admitting: Family Medicine

## 2015-03-24 ENCOUNTER — Encounter (HOSPITAL_COMMUNITY): Payer: Self-pay | Admitting: Emergency Medicine

## 2015-03-24 DIAGNOSIS — N183 Chronic kidney disease, stage 3 unspecified: Secondary | ICD-10-CM | POA: Diagnosis present

## 2015-03-24 DIAGNOSIS — Z880 Allergy status to penicillin: Secondary | ICD-10-CM

## 2015-03-24 DIAGNOSIS — F329 Major depressive disorder, single episode, unspecified: Secondary | ICD-10-CM | POA: Diagnosis present

## 2015-03-24 DIAGNOSIS — Z86010 Personal history of colonic polyps: Secondary | ICD-10-CM | POA: Diagnosis not present

## 2015-03-24 DIAGNOSIS — S72001S Fracture of unspecified part of neck of right femur, sequela: Secondary | ICD-10-CM | POA: Diagnosis not present

## 2015-03-24 DIAGNOSIS — K219 Gastro-esophageal reflux disease without esophagitis: Secondary | ICD-10-CM | POA: Diagnosis present

## 2015-03-24 DIAGNOSIS — D61818 Other pancytopenia: Secondary | ICD-10-CM | POA: Diagnosis present

## 2015-03-24 DIAGNOSIS — Y92003 Bedroom of unspecified non-institutional (private) residence as the place of occurrence of the external cause: Secondary | ICD-10-CM

## 2015-03-24 DIAGNOSIS — D689 Coagulation defect, unspecified: Secondary | ICD-10-CM | POA: Diagnosis present

## 2015-03-24 DIAGNOSIS — E118 Type 2 diabetes mellitus with unspecified complications: Secondary | ICD-10-CM | POA: Diagnosis present

## 2015-03-24 DIAGNOSIS — K589 Irritable bowel syndrome without diarrhea: Secondary | ICD-10-CM | POA: Diagnosis present

## 2015-03-24 DIAGNOSIS — M129 Arthropathy, unspecified: Secondary | ICD-10-CM | POA: Diagnosis present

## 2015-03-24 DIAGNOSIS — R011 Cardiac murmur, unspecified: Secondary | ICD-10-CM

## 2015-03-24 DIAGNOSIS — Z86718 Personal history of other venous thrombosis and embolism: Secondary | ICD-10-CM | POA: Diagnosis not present

## 2015-03-24 DIAGNOSIS — Z7982 Long term (current) use of aspirin: Secondary | ICD-10-CM

## 2015-03-24 DIAGNOSIS — I10 Essential (primary) hypertension: Secondary | ICD-10-CM | POA: Diagnosis not present

## 2015-03-24 DIAGNOSIS — K7460 Unspecified cirrhosis of liver: Secondary | ICD-10-CM | POA: Diagnosis present

## 2015-03-24 DIAGNOSIS — Z66 Do not resuscitate: Secondary | ICD-10-CM | POA: Diagnosis present

## 2015-03-24 DIAGNOSIS — S72141A Displaced intertrochanteric fracture of right femur, initial encounter for closed fracture: Secondary | ICD-10-CM | POA: Diagnosis present

## 2015-03-24 DIAGNOSIS — Z01810 Encounter for preprocedural cardiovascular examination: Secondary | ICD-10-CM

## 2015-03-24 DIAGNOSIS — F419 Anxiety disorder, unspecified: Secondary | ICD-10-CM | POA: Diagnosis present

## 2015-03-24 DIAGNOSIS — W06XXXA Fall from bed, initial encounter: Secondary | ICD-10-CM | POA: Diagnosis present

## 2015-03-24 DIAGNOSIS — S72001D Fracture of unspecified part of neck of right femur, subsequent encounter for closed fracture with routine healing: Secondary | ICD-10-CM

## 2015-03-24 DIAGNOSIS — Z419 Encounter for procedure for purposes other than remedying health state, unspecified: Secondary | ICD-10-CM

## 2015-03-24 DIAGNOSIS — K644 Residual hemorrhoidal skin tags: Secondary | ICD-10-CM | POA: Diagnosis present

## 2015-03-24 DIAGNOSIS — Z01811 Encounter for preprocedural respiratory examination: Secondary | ICD-10-CM

## 2015-03-24 DIAGNOSIS — Z87891 Personal history of nicotine dependence: Secondary | ICD-10-CM | POA: Diagnosis not present

## 2015-03-24 DIAGNOSIS — Y92009 Unspecified place in unspecified non-institutional (private) residence as the place of occurrence of the external cause: Secondary | ICD-10-CM

## 2015-03-24 DIAGNOSIS — K148 Other diseases of tongue: Secondary | ICD-10-CM | POA: Diagnosis present

## 2015-03-24 DIAGNOSIS — W19XXXA Unspecified fall, initial encounter: Secondary | ICD-10-CM

## 2015-03-24 DIAGNOSIS — K648 Other hemorrhoids: Secondary | ICD-10-CM

## 2015-03-24 DIAGNOSIS — Z794 Long term (current) use of insulin: Secondary | ICD-10-CM

## 2015-03-24 DIAGNOSIS — E785 Hyperlipidemia, unspecified: Secondary | ICD-10-CM | POA: Diagnosis present

## 2015-03-24 DIAGNOSIS — E039 Hypothyroidism, unspecified: Secondary | ICD-10-CM | POA: Diagnosis present

## 2015-03-24 DIAGNOSIS — S72141S Displaced intertrochanteric fracture of right femur, sequela: Secondary | ICD-10-CM | POA: Diagnosis not present

## 2015-03-24 DIAGNOSIS — S72001A Fracture of unspecified part of neck of right femur, initial encounter for closed fracture: Secondary | ICD-10-CM

## 2015-03-24 DIAGNOSIS — I129 Hypertensive chronic kidney disease with stage 1 through stage 4 chronic kidney disease, or unspecified chronic kidney disease: Secondary | ICD-10-CM | POA: Diagnosis present

## 2015-03-24 HISTORY — PX: FEMUR IM NAIL: SHX1597

## 2015-03-24 LAB — POCT I-STAT 4, (NA,K, GLUC, HGB,HCT)
Glucose, Bld: 119 mg/dL — ABNORMAL HIGH (ref 70–99)
HCT: 24 % — ABNORMAL LOW (ref 39.0–52.0)
Hemoglobin: 8.2 g/dL — ABNORMAL LOW (ref 13.0–17.0)
POTASSIUM: 5 mmol/L (ref 3.5–5.1)
Sodium: 142 mmol/L (ref 135–145)

## 2015-03-24 LAB — CBC WITH DIFFERENTIAL/PLATELET
BASOS ABS: 0 10*3/uL (ref 0.0–0.1)
Basophils Relative: 0 % (ref 0–1)
Eosinophils Absolute: 0 10*3/uL (ref 0.0–0.7)
Eosinophils Relative: 2 % (ref 0–5)
HEMATOCRIT: 23.2 % — AB (ref 39.0–52.0)
HEMOGLOBIN: 7.3 g/dL — AB (ref 13.0–17.0)
LYMPHS PCT: 14 % (ref 12–46)
Lymphs Abs: 0.3 10*3/uL — ABNORMAL LOW (ref 0.7–4.0)
MCH: 29 pg (ref 26.0–34.0)
MCHC: 31.5 g/dL (ref 30.0–36.0)
MCV: 92.1 fL (ref 78.0–100.0)
MONOS PCT: 7 % (ref 3–12)
Monocytes Absolute: 0.2 10*3/uL (ref 0.1–1.0)
NEUTROS ABS: 1.8 10*3/uL (ref 1.7–7.7)
Neutrophils Relative %: 77 % (ref 43–77)
Platelets: 107 10*3/uL — ABNORMAL LOW (ref 150–400)
RBC: 2.52 MIL/uL — AB (ref 4.22–5.81)
RDW: 16.8 % — ABNORMAL HIGH (ref 11.5–15.5)
WBC: 2.3 10*3/uL — AB (ref 4.0–10.5)

## 2015-03-24 LAB — GLUCOSE, CAPILLARY
Glucose-Capillary: 127 mg/dL — ABNORMAL HIGH (ref 70–99)
Glucose-Capillary: 129 mg/dL — ABNORMAL HIGH (ref 70–99)
Glucose-Capillary: 170 mg/dL — ABNORMAL HIGH (ref 70–99)

## 2015-03-24 LAB — PREPARE RBC (CROSSMATCH)

## 2015-03-24 LAB — CBC
HCT: 33.8 % — ABNORMAL LOW (ref 39.0–52.0)
Hemoglobin: 10.9 g/dL — ABNORMAL LOW (ref 13.0–17.0)
MCH: 29 pg (ref 26.0–34.0)
MCHC: 32.2 g/dL (ref 30.0–36.0)
MCV: 89.9 fL (ref 78.0–100.0)
Platelets: 116 10*3/uL — ABNORMAL LOW (ref 150–400)
RBC: 3.76 MIL/uL — AB (ref 4.22–5.81)
RDW: 16.6 % — ABNORMAL HIGH (ref 11.5–15.5)
WBC: 7.3 10*3/uL (ref 4.0–10.5)

## 2015-03-24 LAB — BASIC METABOLIC PANEL
Anion gap: 3 — ABNORMAL LOW (ref 5–15)
BUN: 30 mg/dL — ABNORMAL HIGH (ref 6–23)
CO2: 25 mmol/L (ref 19–32)
CREATININE: 1.23 mg/dL (ref 0.50–1.35)
Calcium: 8.5 mg/dL (ref 8.4–10.5)
Chloride: 110 mmol/L (ref 96–112)
GFR calc Af Amer: 68 mL/min — ABNORMAL LOW (ref >90)
GFR calc non Af Amer: 59 mL/min — ABNORMAL LOW (ref >90)
GLUCOSE: 134 mg/dL — AB (ref 70–99)
Potassium: 4.7 mmol/L (ref 3.5–5.1)
SODIUM: 138 mmol/L (ref 135–145)

## 2015-03-24 LAB — PROTIME-INR
INR: 1.64 — AB (ref 0.00–1.49)
INR: 1.62 — AB (ref 0.00–1.49)
PROTHROMBIN TIME: 19.4 s — AB (ref 11.6–15.2)
Prothrombin Time: 19.6 seconds — ABNORMAL HIGH (ref 11.6–15.2)

## 2015-03-24 LAB — MRSA PCR SCREENING: MRSA by PCR: NEGATIVE

## 2015-03-24 SURGERY — INSERTION, INTRAMEDULLARY ROD, FEMUR
Anesthesia: General | Site: Hip | Laterality: Right

## 2015-03-24 MED ORDER — FENTANYL CITRATE 0.05 MG/ML IJ SOLN
INTRAMUSCULAR | Status: AC
  Filled 2015-03-24: qty 5

## 2015-03-24 MED ORDER — VITAMIN D3 25 MCG (1000 UT) PO CAPS: 1000.0000 [IU] | ORAL_CAPSULE | Freq: Every morning | ORAL | Status: DC

## 2015-03-24 MED ORDER — ONDANSETRON HCL 4 MG/2ML IJ SOLN: 4.0000 mg | Freq: Four times a day (QID) | INTRAMUSCULAR | Status: DC | PRN

## 2015-03-24 MED ORDER — PROPOFOL 10 MG/ML IV BOLUS
INTRAVENOUS | Status: AC
  Filled 2015-03-24: qty 20

## 2015-03-24 MED ORDER — SIMVASTATIN 40 MG PO TABS
40.0000 mg | ORAL_TABLET | Freq: Every day | ORAL | Status: DC
  Administered 2015-03-25 – 2015-03-26 (×2): 40 mg via ORAL
  Filled 2015-03-24 (×3): qty 1

## 2015-03-24 MED ORDER — HYDROMORPHONE HCL 1 MG/ML IJ SOLN
0.2500 mg | INTRAMUSCULAR | Status: DC | PRN
  Administered 2015-03-24: 0.5 mg via INTRAVENOUS

## 2015-03-24 MED ORDER — POLYETHYLENE GLYCOL 3350 17 G PO PACK: 17.0000 g | PACK | Freq: Every day | ORAL | Status: DC | PRN

## 2015-03-24 MED ORDER — SODIUM CHLORIDE 0.9 % IV SOLN: Freq: Once | INTRAVENOUS | Status: DC

## 2015-03-24 MED ORDER — HYDROMORPHONE HCL 1 MG/ML IJ SOLN
INTRAMUSCULAR | Status: AC
  Filled 2015-03-24: qty 1

## 2015-03-24 MED ORDER — ROCURONIUM BROMIDE 100 MG/10ML IV SOLN
INTRAVENOUS | Status: DC | PRN
  Administered 2015-03-24: 40 mg via INTRAVENOUS

## 2015-03-24 MED ORDER — PHENYLEPHRINE HCL 10 MG/ML IJ SOLN
10.0000 mg | INTRAVENOUS | Status: DC | PRN
  Administered 2015-03-24: 25 ug/min via INTRAVENOUS

## 2015-03-24 MED ORDER — INSULIN ASPART 100 UNIT/ML ~~LOC~~ SOLN: 0.0000 [IU] | SUBCUTANEOUS | Status: DC

## 2015-03-24 MED ORDER — ASPIRIN EC 325 MG PO TBEC
325.0000 mg | DELAYED_RELEASE_TABLET | Freq: Every day | ORAL | Status: DC
  Administered 2015-03-25 – 2015-03-27 (×3): 325 mg via ORAL
  Filled 2015-03-24 (×3): qty 1

## 2015-03-24 MED ORDER — ONDANSETRON HCL 4 MG PO TABS: 4.0000 mg | ORAL_TABLET | Freq: Four times a day (QID) | ORAL | Status: DC | PRN

## 2015-03-24 MED ORDER — MECLIZINE HCL 25 MG PO TABS
25.0000 mg | ORAL_TABLET | Freq: Every day | ORAL | Status: DC | PRN
  Filled 2015-03-24: qty 1

## 2015-03-24 MED ORDER — MENTHOL 3 MG MT LOZG: 1.0000 | LOZENGE | OROMUCOSAL | Status: DC | PRN

## 2015-03-24 MED ORDER — PROPOFOL 10 MG/ML IV BOLUS
INTRAVENOUS | Status: DC | PRN
  Administered 2015-03-24: 100 mg via INTRAVENOUS

## 2015-03-24 MED ORDER — PANTOPRAZOLE SODIUM 40 MG PO TBEC
40.0000 mg | DELAYED_RELEASE_TABLET | Freq: Every day | ORAL | Status: DC
  Administered 2015-03-25 – 2015-03-27 (×3): 40 mg via ORAL
  Filled 2015-03-24 (×3): qty 1

## 2015-03-24 MED ORDER — METOCLOPRAMIDE HCL 5 MG/ML IJ SOLN: 5.0000 mg | Freq: Three times a day (TID) | INTRAMUSCULAR | Status: DC | PRN

## 2015-03-24 MED ORDER — METOCLOPRAMIDE HCL 5 MG PO TABS: 5.0000 mg | ORAL_TABLET | Freq: Three times a day (TID) | ORAL | Status: DC | PRN

## 2015-03-24 MED ORDER — ONDANSETRON HCL 4 MG/2ML IJ SOLN: 4.0000 mg | Freq: Once | INTRAMUSCULAR | Status: DC | PRN

## 2015-03-24 MED ORDER — INSULIN ASPART 100 UNIT/ML ~~LOC~~ SOLN
0.0000 [IU] | SUBCUTANEOUS | Status: DC
  Administered 2015-03-24: 2 [IU] via SUBCUTANEOUS
  Administered 2015-03-25: 1 [IU] via SUBCUTANEOUS
  Administered 2015-03-25: 2 [IU] via SUBCUTANEOUS
  Administered 2015-03-25: 1 [IU] via SUBCUTANEOUS
  Administered 2015-03-25 (×2): 2 [IU] via SUBCUTANEOUS
  Administered 2015-03-26: 100 [IU] via SUBCUTANEOUS
  Administered 2015-03-26 – 2015-03-27 (×2): 1 [IU] via SUBCUTANEOUS

## 2015-03-24 MED ORDER — OXYCODONE HCL 5 MG PO TABS
5.0000 mg | ORAL_TABLET | ORAL | Status: DC | PRN
  Administered 2015-03-25 – 2015-03-27 (×6): 10 mg via ORAL
  Filled 2015-03-24 (×6): qty 2

## 2015-03-24 MED ORDER — FLUOXETINE HCL 20 MG PO CAPS
60.0000 mg | ORAL_CAPSULE | Freq: Every day | ORAL | Status: DC
  Administered 2015-03-25 – 2015-03-27 (×3): 60 mg via ORAL
  Filled 2015-03-24 (×3): qty 3

## 2015-03-24 MED ORDER — TRAZODONE HCL 50 MG PO TABS
75.0000 mg | ORAL_TABLET | Freq: Every day | ORAL | Status: DC
  Administered 2015-03-25 – 2015-03-26 (×2): 75 mg via ORAL
  Filled 2015-03-24 (×2): qty 2

## 2015-03-24 MED ORDER — CLINDAMYCIN PHOSPHATE 900 MG/50ML IV SOLN
900.0000 mg | INTRAVENOUS | Status: DC
  Filled 2015-03-24 (×2): qty 50

## 2015-03-24 MED ORDER — MEPERIDINE HCL 25 MG/ML IJ SOLN: 6.2500 mg | INTRAMUSCULAR | Status: DC | PRN

## 2015-03-24 MED ORDER — FLEET ENEMA 7-19 GM/118ML RE ENEM: 1.0000 | ENEMA | Freq: Once | RECTAL | Status: AC | PRN

## 2015-03-24 MED ORDER — VANCOMYCIN HCL IN DEXTROSE 1-5 GM/200ML-% IV SOLN
1000.0000 mg | INTRAVENOUS | Status: AC
  Administered 2015-03-24: 1000 mg via INTRAVENOUS
  Filled 2015-03-24 (×2): qty 200

## 2015-03-24 MED ORDER — MAGNESIUM HYDROXIDE 400 MG/5ML PO SUSP: 30.0000 mL | Freq: Every day | ORAL | Status: DC | PRN

## 2015-03-24 MED ORDER — FENTANYL CITRATE 0.05 MG/ML IJ SOLN
INTRAMUSCULAR | Status: DC | PRN
  Administered 2015-03-24: 125 ug via INTRAVENOUS
  Administered 2015-03-24: 50 ug via INTRAVENOUS
  Administered 2015-03-24: 75 ug via INTRAVENOUS

## 2015-03-24 MED ORDER — PHENYLEPH-SHARK LIV OIL-MO-PET 0.25-3-14-71.9 % RE OINT
1.0000 "application " | TOPICAL_OINTMENT | Freq: Two times a day (BID) | RECTAL | Status: DC | PRN
  Filled 2015-03-24: qty 28.4

## 2015-03-24 MED ORDER — ARTIFICIAL TEARS OP OINT
TOPICAL_OINTMENT | OPHTHALMIC | Status: AC
  Filled 2015-03-24: qty 3.5

## 2015-03-24 MED ORDER — METHOCARBAMOL 500 MG PO TABS
500.0000 mg | ORAL_TABLET | Freq: Four times a day (QID) | ORAL | Status: DC | PRN
  Administered 2015-03-24 – 2015-03-26 (×5): 500 mg via ORAL
  Filled 2015-03-24 (×5): qty 1

## 2015-03-24 MED ORDER — PHENOL 1.4 % MT LIQD: 1.0000 | OROMUCOSAL | Status: DC | PRN

## 2015-03-24 MED ORDER — LACTATED RINGERS IV SOLN
INTRAVENOUS | Status: DC | PRN
  Administered 2015-03-24: 19:00:00 via INTRAVENOUS

## 2015-03-24 MED ORDER — VITAMIN K1 10 MG/ML IJ SOLN
10.0000 mg | Freq: Once | INTRAMUSCULAR | Status: AC
  Administered 2015-03-24: 10 mg via INTRAVENOUS
  Filled 2015-03-24: qty 1

## 2015-03-24 MED ORDER — VANCOMYCIN HCL IN DEXTROSE 1-5 GM/200ML-% IV SOLN
1000.0000 mg | Freq: Two times a day (BID) | INTRAVENOUS | Status: AC
  Administered 2015-03-25: 1000 mg via INTRAVENOUS
  Filled 2015-03-24: qty 200

## 2015-03-24 MED ORDER — ACETAMINOPHEN 325 MG PO TABS: 650.0000 mg | ORAL_TABLET | ORAL | Status: DC | PRN

## 2015-03-24 MED ORDER — LEVOTHYROXINE SODIUM 25 MCG PO TABS
25.0000 ug | ORAL_TABLET | Freq: Every day | ORAL | Status: DC
  Administered 2015-03-25 – 2015-03-27 (×3): 25 ug via ORAL
  Filled 2015-03-24 (×3): qty 1

## 2015-03-24 MED ORDER — LIDOCAINE HCL (CARDIAC) 20 MG/ML IV SOLN
INTRAVENOUS | Status: DC | PRN
  Administered 2015-03-24: 100 mg via INTRAVENOUS

## 2015-03-24 MED ORDER — LISINOPRIL 5 MG PO TABS: 5.0000 mg | ORAL_TABLET | Freq: Every day | ORAL | Status: DC

## 2015-03-24 MED ORDER — TAMSULOSIN HCL 0.4 MG PO CAPS
0.4000 mg | ORAL_CAPSULE | Freq: Every day | ORAL | Status: DC
  Administered 2015-03-25 – 2015-03-27 (×3): 0.4 mg via ORAL
  Filled 2015-03-24 (×3): qty 1

## 2015-03-24 MED ORDER — DOXYCYCLINE HYCLATE 100 MG PO TABS
100.0000 mg | ORAL_TABLET | Freq: Two times a day (BID) | ORAL | Status: DC
  Administered 2015-03-25 – 2015-03-27 (×5): 100 mg via ORAL
  Filled 2015-03-24 (×5): qty 1

## 2015-03-24 MED ORDER — VITAMIN D 1000 UNITS PO TABS
1000.0000 [IU] | ORAL_TABLET | Freq: Every day | ORAL | Status: DC
  Administered 2015-03-25 – 2015-03-27 (×3): 1000 [IU] via ORAL
  Filled 2015-03-24 (×3): qty 1

## 2015-03-24 MED ORDER — SODIUM CHLORIDE 0.9 % IV SOLN
Freq: Once | INTRAVENOUS | Status: AC
  Administered 2015-03-24: 09:00:00 via INTRAVENOUS

## 2015-03-24 MED ORDER — BISACODYL 10 MG RE SUPP: 10.0000 mg | RECTAL | Status: DC | PRN

## 2015-03-24 MED ORDER — MORPHINE SULFATE 2 MG/ML IJ SOLN
0.5000 mg | INTRAMUSCULAR | Status: DC | PRN
  Administered 2015-03-24 (×2): 0.5 mg via INTRAVENOUS
  Filled 2015-03-24 (×2): qty 1

## 2015-03-24 MED ORDER — LIDOCAINE HCL (CARDIAC) 20 MG/ML IV SOLN
INTRAVENOUS | Status: AC
  Filled 2015-03-24: qty 5

## 2015-03-24 MED ORDER — LACTATED RINGERS IV SOLN
INTRAVENOUS | Status: DC
  Administered 2015-03-24: 16:00:00 via INTRAVENOUS

## 2015-03-24 MED ORDER — INSULIN GLARGINE 100 UNIT/ML ~~LOC~~ SOLN
30.0000 [IU] | Freq: Two times a day (BID) | SUBCUTANEOUS | Status: DC
  Administered 2015-03-24 – 2015-03-26 (×5): 30 [IU] via SUBCUTANEOUS
  Filled 2015-03-24 (×8): qty 0.3

## 2015-03-24 MED ORDER — FENTANYL CITRATE 0.05 MG/ML IJ SOLN
50.0000 ug | INTRAMUSCULAR | Status: AC | PRN
Start: 2015-03-24 — End: 2015-03-24
  Administered 2015-03-24 (×2): 50 ug via INTRAVENOUS
  Filled 2015-03-24 (×2): qty 2

## 2015-03-24 MED ORDER — LAMOTRIGINE 25 MG PO TABS
50.0000 mg | ORAL_TABLET | Freq: Two times a day (BID) | ORAL | Status: DC
  Administered 2015-03-25 – 2015-03-27 (×5): 50 mg via ORAL
  Filled 2015-03-24 (×8): qty 2

## 2015-03-24 MED ORDER — HYDROCORTISONE 2.5 % RE CREA
TOPICAL_CREAM | Freq: Two times a day (BID) | RECTAL | Status: DC
  Administered 2015-03-25: 1 via RECTAL
  Administered 2015-03-25 – 2015-03-26 (×3): via RECTAL
  Filled 2015-03-24: qty 28.35

## 2015-03-24 MED ORDER — HYDROCODONE-ACETAMINOPHEN 5-325 MG PO TABS
1.0000 | ORAL_TABLET | Freq: Four times a day (QID) | ORAL | Status: DC | PRN
  Administered 2015-03-24 – 2015-03-26 (×4): 2 via ORAL
  Filled 2015-03-24 (×4): qty 2

## 2015-03-24 MED ORDER — FENTANYL CITRATE 0.05 MG/ML IJ SOLN
100.0000 ug | Freq: Once | INTRAMUSCULAR | Status: AC
  Administered 2015-03-24: 100 ug via NASAL
  Filled 2015-03-24: qty 2

## 2015-03-24 MED ORDER — LOSARTAN POTASSIUM 25 MG PO TABS
25.0000 mg | ORAL_TABLET | Freq: Every day | ORAL | Status: DC
  Administered 2015-03-25 – 2015-03-26 (×2): 25 mg via ORAL
  Filled 2015-03-24 (×3): qty 1

## 2015-03-24 MED ORDER — SODIUM CHLORIDE 0.9 % IV SOLN
INTRAVENOUS | Status: DC
  Administered 2015-03-24 – 2015-03-25 (×2): via INTRAVENOUS

## 2015-03-24 MED ORDER — BISACODYL 10 MG RE SUPP: 10.0000 mg | Freq: Every day | RECTAL | Status: DC | PRN

## 2015-03-24 MED ORDER — ONDANSETRON HCL 4 MG/2ML IJ SOLN
INTRAMUSCULAR | Status: DC | PRN
  Administered 2015-03-24: 4 mg via INTRAVENOUS

## 2015-03-24 SURGICAL SUPPLY — 51 items
BIT DRILL LONG 4.0 (BIT) IMPLANT
BLADE SURG 15 STRL LF DISP TIS (BLADE) ×1 IMPLANT
BLADE SURG 15 STRL SS (BLADE) ×2
BLADE SURG ROTATE 9660 (MISCELLANEOUS) IMPLANT
COVER PERINEAL POST (MISCELLANEOUS) ×2 IMPLANT
COVER SURGICAL LIGHT HANDLE (MISCELLANEOUS) ×2 IMPLANT
DRAPE INCISE IOBAN 66X45 STRL (DRAPES) ×2 IMPLANT
DRAPE STERI IOBAN 125X83 (DRAPES) ×2 IMPLANT
DRILL BIT LONG 4.0 (BIT) ×4
DRSG MEPILEX BORDER 4X4 (GAUZE/BANDAGES/DRESSINGS) ×1 IMPLANT
DRSG MEPILEX BORDER 4X8 (GAUZE/BANDAGES/DRESSINGS) ×2 IMPLANT
DURAPREP 26ML APPLICATOR (WOUND CARE) ×2 IMPLANT
ELECT REM PT RETURN 9FT ADLT (ELECTROSURGICAL) ×2
ELECTRODE REM PT RTRN 9FT ADLT (ELECTROSURGICAL) ×1 IMPLANT
FACESHIELD WRAPAROUND (MASK) IMPLANT
GAUZE XEROFORM 5X9 LF (GAUZE/BANDAGES/DRESSINGS) ×2 IMPLANT
GLOVE BIOGEL PI IND STRL 7.5 (GLOVE) ×1 IMPLANT
GLOVE BIOGEL PI INDICATOR 7.5 (GLOVE) ×1
GLOVE ECLIPSE 7.0 STRL STRAW (GLOVE) ×2 IMPLANT
GLOVE ECLIPSE 9.0 STRL (GLOVE) ×2 IMPLANT
GLOVE SURG 8.5 LATEX PF (GLOVE) ×1 IMPLANT
GOWN STRL REUS W/ TWL LRG LVL3 (GOWN DISPOSABLE) ×1 IMPLANT
GOWN STRL REUS W/TWL 2XL LVL3 (GOWN DISPOSABLE) ×2 IMPLANT
GOWN STRL REUS W/TWL LRG LVL3 (GOWN DISPOSABLE) ×2
GOWN STRL REUS W/TWL XL LVL3 (GOWN DISPOSABLE) ×2 IMPLANT
GUIDE PIN ×1 IMPLANT
GUIDE PIN 3.2X343 (PIN) ×1
GUIDE PIN 3.2X343MM (PIN) ×2
GUIDE ROD 3.0 (MISCELLANEOUS) ×2
KIT BASIN OR (CUSTOM PROCEDURE TRAY) ×2 IMPLANT
KIT ROOM TURNOVER OR (KITS) ×2 IMPLANT
LINER BOOT UNIVERSAL DISP (MISCELLANEOUS) ×1 IMPLANT
MANIFOLD NEPTUNE II (INSTRUMENTS) ×1 IMPLANT
NAIL TRIGEN INTERTAN 10X18CM (Nail) ×2 IMPLANT
NS IRRIG 1000ML POUR BTL (IV SOLUTION) ×2 IMPLANT
PACK GENERAL/GYN (CUSTOM PROCEDURE TRAY) ×2 IMPLANT
PAD ARMBOARD 7.5X6 YLW CONV (MISCELLANEOUS) ×4 IMPLANT
PIN GUIDE 3.2MM (PIN) ×1 IMPLANT
PIN GUIDE 3.2X343MM (PIN) ×1 IMPLANT
ROD GUIDE 3.0 (MISCELLANEOUS) ×1 IMPLANT
SCREW LAG COMPR KIT 80/75 (Screw) ×1 IMPLANT
SCREW TRIGEN LOW PROF 5.0X32.5 (Screw) ×1 IMPLANT
SPONGE LAP 4X18 X RAY DECT (DISPOSABLE) IMPLANT
STAPLER VISISTAT 35W (STAPLE) ×1 IMPLANT
SUT ETHILON 2 0 FS 18 (SUTURE) ×1 IMPLANT
SUT VIC AB 2-0 CT1 27 (SUTURE) ×2
SUT VIC AB 2-0 CT1 TAPERPNT 27 (SUTURE) ×1 IMPLANT
TAPE STRIPS DRAPE STRL (GAUZE/BANDAGES/DRESSINGS) IMPLANT
TOWEL OR 17X24 6PK STRL BLUE (TOWEL DISPOSABLE) ×2 IMPLANT
TOWEL OR 17X26 10 PK STRL BLUE (TOWEL DISPOSABLE) ×2 IMPLANT
WATER STERILE IRR 1000ML POUR (IV SOLUTION) ×1 IMPLANT

## 2015-03-24 NOTE — H&P (Signed)
[facility] History and Physical  [name] EQA:[id number] DOB: Apr 30, 1946 DOA: 03/24/2015  Referring physician: Baron Sane, PA-C, EDP PCP: Hennie Duos, MD   Chief Complaint: R hip injury, fall  HPI: [name] is a 69 y.o. male retired [employer] representative, with a PMH of L hip fracture in January 2016, cirrhosis, DM, anemia, HLD, HTN, hemorrhoids and GERD who presents from SNF after falling out of his bed onto a concrete floor early this morning.  He fell onto his R hip and hit his head on the bedside table. Pt does endorse possibly losing consciousness during the fall but and states he was on the ground for ~ 14mnutes before someone found him, he denies any head injury. He endorses 10/10 R hip pain since the fall with a noticeable change in the length of his R leg. Prior to the fall pt had a sinus infection and was being treated with doxycycline for it. He states otherwise he was well and the rehab for his L hip was going well. He was up to walking ~50 yards with a walker before he would feel Harper of breath and was using a stationary bike for 40 minutes.  He denies any cp, SOB, abdominal pain, vomiting, changes in vision or confusion. He does endorse a minor HA last night that was relieved without medication.   Physical exam showed a shortened R leg that was externally rotated. Hip is exquisitely tender to touch and movement. Imaging of hip shows an acute intertrochanteric R femur fracture. Labs revealed pancytopenia with a HGB of 7.3, baseline 8 and an INR of 1.64. We will transfuse 2 unit PRBs prior to surgery. Last meal was 1700 yesterday, we will keep pt NPO as ortho will likely be taking him to surgery today.   Review of Systems:  HEENT: Positive for headache, nasal congestion. Negative for changes in vision. Dry MM Cardio-vascular: Positive for increased swelling to LE. No chest pain, palpitations  GI: Positive for black stools.No heartburn, indigestion, abdominal pain,  nausea, vomiting, diarrhea, change in bowel habits, loss of appetite  Resp: No shortness of breath with exertion or at rest.  Skin: no rash or lesions.  Musculoskeletal: Positive for R hip tender to touch and with movement. Externally rotated R leg. L hip doing well and nontender.  Psych: No change in mood or affect.   Past Medical History  Diagnosis Date  . Hyperglycemia   . Hyponatremia   . DMII (diabetes mellitus, type 2)   . Depression   . IBS (irritable bowel syndrome)   . Chest pain   . Unspecified gastritis and gastroduodenitis without mention of hemorrhage   . Arthropathy, unspecified, site unspecified   . History of colon polyps   . Anemia, unspecified   . Anxiety   . GERD (gastroesophageal reflux disease)   . Hyperlipidemia   . Hypertension    Past Surgical History  Procedure Laterality Date  . Hernia repair  1964  . Intramedullary (im) nail intertrochanteric Left 01/07/2015    Procedure: INTRAMEDULLARY (IM) NAIL INTERTROCHANTRIC;  Surgeon: CMcarthur Rossetti MD;  Location: [facility];  Service: Orthopedics;  Laterality: Left;   Social History:  reports that he has quit smoking. His smoking use included Cigarettes. He smoked 1.00 pack per day. He does not have any smokeless tobacco history on file. He reports that he does not drink alcohol or use illicit drugs.  Allergies  Allergen Reactions  . Penicillins Hives    Family History  Problem  Relation Age of Onset  . Stroke Mother   . Heart attack Father   . Heart disease Father   . Cancer Other     FH of Breast Cancer-other Relative  . Heart disease Other     Parent  . Hypertension Other     parent  . Cancer Maternal Uncle     colon    Prior to Admission medications   Medication Sig Start Date End Date Taking? Authorizing Provider  acetaminophen (TYLENOL) 500 MG tablet Take 1,000 mg by mouth every 4 (four) hours as needed for mild pain.   Yes Historical Provider, MD  aspirin EC 325 MG EC tablet Take 1  tablet (325 mg total) by mouth daily with breakfast. 01/09/15  Yes Mcarthur Rossetti, MD  bisacodyl (DULCOLAX) 10 MG suppository Place 10 mg rectally as needed for moderate constipation.   Yes Historical Provider, MD  Cholecalciferol (VITAMIN D3) 1000 UNITS CAPS Take 1,000 Units by mouth every morning.    Yes Historical Provider, MD  doxycycline (VIBRA-TABS) 100 MG tablet Take 100 mg by mouth 2 (two) times daily.   Yes Historical Provider, MD  ferrous sulfate 325 (65 FE) MG tablet Take 325 mg by mouth daily with breakfast.   Yes Historical Provider, MD  FLUoxetine (PROZAC) 20 MG capsule Take 60 mg by mouth daily.   Yes Historical Provider, MD  HYDROcodone-acetaminophen (NORCO/VICODIN) 5-325 MG per tablet Take 1 tablet by mouth every 6 (six) hours as needed for moderate pain. 01/29/15  Yes Gildardo Cranker, DO  insulin aspart (NOVOLOG) 100 UNIT/ML injection Inject 0-9 Units into the skin 3 (three) times daily with meals. 01/10/15  Yes Debbe Odea, MD  insulin glargine (LANTUS) 100 UNIT/ML injection Inject 36 Units into the skin 2 (two) times daily.    Yes Historical Provider, MD  lamoTRIgine (LAMICTAL) 25 MG tablet Take 50 mg by mouth 2 (two) times daily.   Yes Historical Provider, MD  levothyroxine (SYNTHROID, LEVOTHROID) 25 MCG tablet Take 25 mcg by mouth daily. 10/10/13  Yes Historical Provider, MD  lisinopril (PRINIVIL,ZESTRIL) 5 MG tablet Take 5 mg by mouth daily. 12/11/13  Yes Historical Provider, MD  losartan (COZAAR) 25 MG tablet Take 25 mg by mouth daily.   Yes Historical Provider, MD  magnesium hydroxide (MILK OF MAGNESIA) 400 MG/5ML suspension Take 30 mLs by mouth daily as needed for mild constipation.   Yes Historical Provider, MD  meclizine (ANTIVERT) 25 MG tablet Take 25 mg by mouth daily.   Yes Historical Provider, MD  Melatonin 10 MG TABS Take 10 mg by mouth at bedtime.   Yes Historical Provider, MD  methocarbamol (ROBAXIN) 500 MG tablet Take 1 tablet (500 mg total) by mouth every 6  (six) hours as needed for muscle spasms. 01/09/15  Yes Mcarthur Rossetti, MD  omeprazole (PRILOSEC) 20 MG capsule Take 20 mg by mouth every morning.    Yes Historical Provider, MD  phenylephrine-shark liver oil-mineral oil-petrolatum (PREPARATION H) 0.25-3-14-71.9 % rectal ointment Place 1 application rectally 2 (two) times daily as needed for hemorrhoids.   Yes Historical Provider, MD  polyethylene glycol (MIRALAX / GLYCOLAX) packet Take 17 g by mouth daily as needed for mild constipation.    Yes Historical Provider, MD  simvastatin (ZOCOR) 40 MG tablet Take 40 mg by mouth at bedtime.  11/12/13  Yes Historical Provider, MD  Sodium Phosphates (RA SALINE ENEMA RE) Place 1 each rectally as needed (for constipation).   Yes Historical Provider, MD  tamsulosin (FLOMAX) 0.4  MG CAPS capsule Take 0.4 mg by mouth daily.    Yes Historical Provider, MD  traZODone (DESYREL) 50 MG tablet Take 75 mg by mouth at bedtime.    Yes Historical Provider, MD  UNABLE TO FIND Take 1 each by mouth 2 (two) times daily. Med Name: magic cup   Yes Historical Provider, MD  Wheat Dextrin (BENEFIBER) POWD Take 2 mLs by mouth every morning. Mix in 4 oz of water and drink   Yes Historical Provider, MD  glucose blood test strip 1 each by Other route 4 (four) times daily -  before meals and at bedtime. Use as instructed    Historical Provider, MD   Physical Exam: Filed Vitals:   03/24/15 0903 03/24/15 0915 03/24/15 0930 03/24/15 0945  BP:  122/54 129/58 127/49  Pulse:  68 67 64  Temp: 97.9 F (36.6 C)  98 F (36.7 C)   TempSrc:   Oral   Resp:  13 18 12   Height:      Weight:      SpO2:   97% 96%    Wt Readings from Last 3 Encounters:  03/24/15 75.297 kg (166 lb)  02/11/15 72.576 kg (160 lb)  01/23/15 77.747 kg (171 lb 6.4 oz)    General:  Appears calm and comfortable. Laying comfortably in bed upon entering room.  Eyes: PERRL, normal lids, irises & conjunctiva ENT: grossly normal hearing. Dry mucus membranes.  Possible tardive dyskinesia of the tongue.   Neck: no masses or thyromegaly Cardiovascular: RRR. No LE edema. 3/6 systolic murmur. Good distal pulses throughout including R pedal and posterior tibialis. Respiratory: CTA bilaterally, no w/r/r. Normal respiratory effort. Abdomen: soft, nt. Distended, +bs, + small umbilical hernia Skin: no rash or induration seen on limited exam Musculoskeletal: R hip acutely tender, R leg fixed externally rotated and shorter than L leg. No hematoma noted to R hip or R buttock. L hip surgical scar well healed. Psychiatric: grossly normal mood and affect, speech fluent and appropriate Neurologic: grossly non-focal. Sensation intact throughout lower extremities bilaterally.           Labs on Admission:  Basic Metabolic Panel:  Recent Labs Lab 03/24/15 0640  NA 138  K 4.7  CL 110  CO2 25  GLUCOSE 134*  BUN 30*  CREATININE 1.23  CALCIUM 8.5   CBC:  Recent Labs Lab 03/24/15 0640  WBC 2.3*  NEUTROABS 1.8  HGB 7.3*  HCT 23.2*  MCV 92.1  PLT 107*    Ref. Range 03/24/2015 06:40  Prothrombin Time Latest Range: 11.6-15.2 seconds 19.6 (H)  INR Latest Range: 0.00-1.49  1.64 (H)     Radiological Exams on Admission: Dg Chest 1 View  03/24/2015   CLINICAL DATA:  Status post fall 03/23/2015.  Right hip fracture.  EXAM: CHEST  1 VIEW  COMPARISON:  Single view of the chest 01/06/2015. PA and lateral chest 01/04/2014.  FINDINGS: Heart size is upper normal. The lungs are clear. No pneumothorax or pleural effusion.  IMPRESSION: No acute disease.   Electronically Signed   By: Inge Rise M.D.   On: 03/24/2015 07:32   Dg Hip Unilat  With Pelvis 2-3 Views Right  03/24/2015   CLINICAL DATA:  Fall, right hip pain  EXAM: RIGHT HIP (WITH PELVIS) 2-3 VIEWS  COMPARISON:  Radiograph 01/07/2015  FINDINGS: Acute intertrochanteric fracture of the proximal right femur. This avulsion of the lesser trochanter. Mild varus angulation. No dislocation. Internal fixation of  left hip fracture noted  IMPRESSION:  Acute right intertrochanteric femur fracture.   Electronically Signed   By: Suzy Bouchard M.D.   On: 03/24/2015 07:34    EKG: Independently reviewed. Sinus rhythm of 69 bpm. No acute ST changes, QTc WNL  Assessment/Plan Principal Problem:   Closed intertrochanteric fracture of right hip Active Problems:   Anxiety   Hyperlipidemia   Hypertension   DM (diabetes mellitus), type 2 with complications   Internal and external bleeding hemorrhoids   Other pancytopenia   Hypothyroidism   CKD (chronic kidney disease) stage 3, GFR 30-59 ml/min   Closed right hip fracture   Murmur, cardiac   Abnormal rhythmic movement of tongue   Liver cirrhosis  Closed intertrochanteric fracture of R hip Fall out of bed landing on R hip on concrete floor. Hip externally rotated and R leg shorter than L leg.  Imaging shows intertrochanteric fracture of the R femur. Hip is exquisitely tender to touch and movement. No hematoma noted to area.  Dr. Louanne Skye, Ortho has been consulted and will likely be taking pt to surgery around 1700 today pending presurgical eval. Will keep pt NPO prior to surgery. Hydrate with IV fluids prn. Morphine prn.  Preoperative risk assessment / clearance   . No angina, dyspnea, syncope, and palpitations, history of heart disease.  Positive for history of hypertension, diabetes, chronic kidney disease.   . Cardiac functional status : 1 METS (Can take care of self, such as eat, dress, or use the toilet (1 MET)  . Preoperative ECG reviewed and shows NSR. No Q waves or significant ST-segment elevation or depression  . Using the [facility] preoperative cardiac risk calculator, the patient's estimated risk probability for perioperative MI or cardiac arrest is 1.21%     Anxiety Pt is calm at bedside. Continue on prozac, lamictal, trazodone daily.   HLD Continue on simvastatin.   HTN: 125/56 in ED.  Provide lisinopril, and losartan and monitor  pressure.   DM: Glucose 134 in ED.  Lantus BID and Novolog at SNF.  Will continue. Q 4 hours CBGs while NPO.  Internal and external bleeding hemorrhoids: Preparation H as well as stool softeners and laxatives to prevent constipation.  Pancytopenia: Providing 2 units PRBC.  Sees Dr. Alen Blew (office visit note 01/23/15).  Who feels this is likely due to liver cirrhosis.  Normocytic Anemia with report of black stools Likely secondary to recent surgery and Liver disease. Will guiac stool.  May need GI evaluation as an outpatient.  Hx of Carotid Stenosis with new Cardiac Murmur Carotid U/S in 2013 showed 40- 59% bilateral ICA stenosis. 3/6 systolic murmur heard best at right sternal boarder. 2D echo and Carotid dopplers ordered as part of pre surgical clearance.  CKD. Creatinine at baseline.    Code Status: DNR DVT Prophylaxis: Per orthopedics.  Patient has pancytopenia. Family Communication: Sister at bedside. Disposition Plan: To SNF when appropriate.  Time spent: [place], PA-S Imogene Burn, [place] [facility] Pager [phone]

## 2015-03-24 NOTE — Progress Notes (Addendum)
*  PRELIMINARY RESULTS* Vascular Ultrasound Carotid Duplex (Doppler) has been completed.   Findings suggest 1-39% internal carotid artery stenosis bilaterally. Vertebral arteries are patent with antegrade flow.  Preliminary results discussed with Dr. Otelia SergeantNitka.  03/24/2015 10:00 AM Gertie FeyMichelle Jaanai Salemi, RVT, RDCS, RDMS

## 2015-03-24 NOTE — Anesthesia Preprocedure Evaluation (Addendum)
Anesthesia Evaluation  Patient identified by MRN, date of birth, ID band Patient awake    Reviewed: Allergy & Precautions, NPO status , Patient's Chart, lab work & pertinent test results  History of Anesthesia Complications Negative for: history of anesthetic complications  Airway Mallampati: II  TM Distance: >3 FB Neck ROM: Full    Dental   Pulmonary former smoker,  breath sounds clear to auscultation  Pulmonary exam normal       Cardiovascular hypertension, Pt. on medications + Valvular Problems/Murmurs AS, AI and MR + Systolic murmurs ECHO 03/24/15 Study Conclusions  - Left ventricle: The cavity size was normal. Wall thickness was normal. Systolic function was normal. The estimated ejection fraction was in the range of 55% to 60%. Wall motion was normal; there were no regional wall motion abnormalities. Features are consistent with a pseudonormal left ventricular filling pattern, with concomitant abnormal relaxation and increased filling pressure (grade 2 diastolic dysfunction). - Aortic valve: Right coronary cusp mobility was severely restricted. There was moderate to severe stenosis. Although Doppler velocities and calculated AV area suggest severe stenosis, by planimetry and general 2D evaluation, there is only moderate stenosi, There was mild to moderate regurgitation directed centrally in the LVOT. Valve area (VTI): 0.9 cm^2. Valve area (Vmax): 0.71 cm^2. Valve area (Vmean): 0.87 cm^2. - Mitral valve: There was mild to moderate regurgitation directed centrally. Valve area by continuity equation (using LVOT flow): 1.61 cm^2. - Left atrium: The atrium was mildly to moderately dilated.    Neuro/Psych PSYCHIATRIC DISORDERS Anxiety Depression negative neurological ROS     GI/Hepatic Neg liver ROS, GERD-  Medicated and Controlled,(+) Cirrhosis -      ,   Endo/Other  diabetes, Type 2,  Insulin Dependent, Oral Hypoglycemic Agents  Renal/GU Renal InsufficiencyRenal disease     Musculoskeletal   Abdominal   Peds  Hematology   Anesthesia Other Findings   Reproductive/Obstetrics                         Anesthesia Physical Anesthesia Plan  ASA: III  Anesthesia Plan: General   Post-op Pain Management:    Induction: Intravenous, Rapid sequence and Cricoid pressure planned  Airway Management Planned: Oral ETT  Additional Equipment:   Intra-op Plan:   Post-operative Plan: Extubation in OR  Informed Consent: I have reviewed the patients History and Physical, chart, labs and discussed the procedure including the risks, benefits and alternatives for the proposed anesthesia with the patient or authorized representative who has indicated his/her understanding and acceptance.     Plan Discussed with: CRNA and Surgeon  Anesthesia Plan Comments:        Anesthesia Quick Evaluation

## 2015-03-24 NOTE — ED Provider Notes (Signed)
CSN: [id number]     Arrival date & time 03/24/15  0537 History   First MD Initiated Contact with Patient 03/24/15 [id number]     Chief Complaint  Patient presents with  . Hip Pain    Right   . Leg Pain     (Consider location/radiation/quality/duration/timing/severity/associated sxs/prior Treatment) HPI Comments: Patient is a 69 yo M PMHx significant for DM, Anemia, HLD, HTN, presenting to the ED from a skilled nursing facility for evaluation of right hip pain. Patient states he rolled out of bed last evening and landed on his right hip. He states he scrapped the back of his head on his bedside table, but did not have any LOC. Endorses 10/10 pain currently. He was unable to ambulate after the incident, normally ambulates without assistance. Patient broke his left hip one month ago, followed by [facility].   Patient is a 69 y.o. male presenting with hip pain and leg pain.  Hip Pain Associated symptoms include arthralgias, joint swelling and myalgias.  Leg Pain   Past Medical History  Diagnosis Date  . Hyperglycemia   . Hyponatremia   . DMII (diabetes mellitus, type 2)   . Depression   . IBS (irritable bowel syndrome)   . Chest pain   . Unspecified gastritis and gastroduodenitis without mention of hemorrhage   . Arthropathy, unspecified, site unspecified   . History of colon polyps   . Anemia, unspecified   . Anxiety   . GERD (gastroesophageal reflux disease)   . Hyperlipidemia   . Hypertension    Past Surgical History  Procedure Laterality Date  . Hernia repair  1964  . Intramedullary (im) nail intertrochanteric Left 01/07/2015    Procedure: INTRAMEDULLARY (IM) NAIL INTERTROCHANTRIC;  Surgeon: Kathryne Hitch, MD;  Location: [facility];  Service: Orthopedics;  Laterality: Left;   Family History  Problem Relation Age of Onset  . Stroke Mother   . Heart attack Father   . Heart disease Father   . Cancer Other     FH of Breast Cancer-other Relative  . Heart disease  Other     Parent  . Hypertension Other     parent  . Cancer Maternal Uncle     colon   History  Substance Use Topics  . Smoking status: Former Smoker -- 1.00 packs/day    Types: Cigarettes  . Smokeless tobacco: Not on file  . Alcohol Use: No    Review of Systems  Musculoskeletal: Positive for myalgias, joint swelling and arthralgias.  All other systems reviewed and are negative.     Allergies  Penicillins  Home Medications   Prior to Admission medications   Medication Sig Start Date End Date Taking? Authorizing Provider  acetaminophen (TYLENOL) 500 MG tablet Take 1,000 mg by mouth every 4 (four) hours as needed for mild pain.   Yes Historical Provider, MD  aspirin EC 325 MG EC tablet Take 1 tablet (325 mg total) by mouth daily with breakfast. 01/09/15  Yes Kathryne Hitch, MD  bisacodyl (DULCOLAX) 10 MG suppository Place 10 mg rectally as needed for moderate constipation.   Yes Historical Provider, MD  Cholecalciferol (VITAMIN D3) 1000 UNITS CAPS Take 1,000 Units by mouth every morning.    Yes Historical Provider, MD  doxycycline (VIBRA-TABS) 100 MG tablet Take 100 mg by mouth 2 (two) times daily.   Yes Historical Provider, MD  ferrous sulfate 325 (65 FE) MG tablet Take 325 mg by mouth daily with breakfast.   Yes Historical Provider,  MD  FLUoxetine (PROZAC) 20 MG capsule Take 60 mg by mouth daily.   Yes Historical Provider, MD  HYDROcodone-acetaminophen (NORCO/VICODIN) 5-325 MG per tablet Take 1 tablet by mouth every 6 (six) hours as needed for moderate pain. 01/29/15  Yes Kirt BoysMonica Carter, DO  insulin aspart (NOVOLOG) 100 UNIT/ML injection Inject 0-9 Units into the skin 3 (three) times daily with meals. 01/10/15  Yes Calvert CantorSaima Rizwan, MD  insulin glargine (LANTUS) 100 UNIT/ML injection Inject 36 Units into the skin 2 (two) times daily.    Yes Historical Provider, MD  lamoTRIgine (LAMICTAL) 25 MG tablet Take 50 mg by mouth 2 (two) times daily.   Yes Historical Provider, MD   levothyroxine (SYNTHROID, LEVOTHROID) 25 MCG tablet Take 25 mcg by mouth daily. 10/10/13  Yes Historical Provider, MD  lisinopril (PRINIVIL,ZESTRIL) 5 MG tablet Take 5 mg by mouth daily. 12/11/13  Yes Historical Provider, MD  losartan (COZAAR) 25 MG tablet Take 25 mg by mouth daily.   Yes Historical Provider, MD  magnesium hydroxide (MILK OF MAGNESIA) 400 MG/5ML suspension Take 30 mLs by mouth daily as needed for mild constipation.   Yes Historical Provider, MD  meclizine (ANTIVERT) 25 MG tablet Take 25 mg by mouth daily.   Yes Historical Provider, MD  Melatonin 10 MG TABS Take 10 mg by mouth at bedtime.   Yes Historical Provider, MD  methocarbamol (ROBAXIN) 500 MG tablet Take 1 tablet (500 mg total) by mouth every 6 (six) hours as needed for muscle spasms. 01/09/15  Yes Kathryne Hitchhristopher Y Blackman, MD  omeprazole (PRILOSEC) 20 MG capsule Take 20 mg by mouth every morning.    Yes Historical Provider, MD  phenylephrine-shark liver oil-mineral oil-petrolatum (PREPARATION H) 0.25-3-14-71.9 % rectal ointment Place 1 application rectally 2 (two) times daily as needed for hemorrhoids.   Yes Historical Provider, MD  polyethylene glycol (MIRALAX / GLYCOLAX) packet Take 17 g by mouth daily as needed for mild constipation.    Yes Historical Provider, MD  simvastatin (ZOCOR) 40 MG tablet Take 40 mg by mouth at bedtime.  11/12/13  Yes Historical Provider, MD  Sodium Phosphates (RA SALINE ENEMA RE) Place 1 each rectally as needed (for constipation).   Yes Historical Provider, MD  tamsulosin (FLOMAX) 0.4 MG CAPS capsule Take 0.4 mg by mouth daily.    Yes Historical Provider, MD  traZODone (DESYREL) 50 MG tablet Take 75 mg by mouth at bedtime.    Yes Historical Provider, MD  UNABLE TO FIND Take 1 each by mouth 2 (two) times daily. Med Name: magic cup   Yes Historical Provider, MD  Wheat Dextrin (BENEFIBER) POWD Take 2 mLs by mouth every morning. Mix in 4 oz of water and drink   Yes Historical Provider, MD  glucose blood  test strip 1 each by Other route 4 (four) times daily -  before meals and at bedtime. Use as instructed    Historical Provider, MD   BP 127/49 mmHg  Pulse 64  Temp(Src) 98 F (36.7 C) (Oral)  Resp 12  Ht 5\' 6"  (1.676 m)  Wt 166 lb (75.297 kg)  BMI 26.81 kg/m2  SpO2 96% Physical Exam  Constitutional: He is oriented to person, place, and time. He appears well-developed and well-nourished. No distress.  HENT:  Head: Normocephalic and atraumatic.  Right Ear: External ear normal.  Left Ear: External ear normal.  Nose: Nose normal.  Mouth/Throat: Oropharynx is clear and moist. No oropharyngeal exudate.  Eyes: Conjunctivae and EOM are normal. Pupils are equal, round, and  reactive to light.  Neck: Normal range of motion. Neck supple.  No nuchal rigidity.   Cardiovascular: Normal rate, regular rhythm and intact distal pulses.   Murmur heard. Pulmonary/Chest: Effort normal and breath sounds normal. No respiratory distress.  Abdominal: Soft. There is no tenderness.  Musculoskeletal: He exhibits tenderness.       Right hip: He exhibits decreased range of motion, decreased strength, tenderness and bony tenderness.       Left hip: Normal.       Lumbar back: Normal.  Right leg shortened and externally rotated.   Neurological: He is alert and oriented to person, place, and time.  Skin: Skin is warm and dry. He is not diaphoretic.  Psychiatric: He has a normal mood and affect.  Nursing note and vitals reviewed.   ED Course  Procedures (including critical care time) Medications  phytonadione (VITAMIN K) 10 mg in dextrose 5 % 50 mL IVPB (not administered)  insulin aspart (novoLOG) injection 0-9 Units (not administered)  0.9 %  sodium chloride infusion (not administered)  fentaNYL (SUBLIMAZE) injection 100 mcg (100 mcg Nasal Given 03/24/15 0624)  fentaNYL (SUBLIMAZE) injection 50 mcg (50 mcg Intravenous Given 03/24/15 0750)  0.9 %  sodium chloride infusion ( Intravenous New Bag/Given 03/24/15  0906)    Labs Review Labs Reviewed  BASIC METABOLIC PANEL - Abnormal; Notable for the following:    Glucose, Bld 134 (*)    BUN 30 (*)    GFR calc non Af Amer 59 (*)    GFR calc Af Amer 68 (*)    Anion gap 3 (*)    All other components within normal limits  CBC WITH DIFFERENTIAL/PLATELET - Abnormal; Notable for the following:    WBC 2.3 (*)    RBC 2.52 (*)    Hemoglobin 7.3 (*)    HCT 23.2 (*)    RDW 16.8 (*)    Platelets 107 (*)    Lymphs Abs 0.3 (*)    All other components within normal limits  PROTIME-INR - Abnormal; Notable for the following:    Prothrombin Time 19.6 (*)    INR 1.64 (*)    All other components within normal limits  TYPE AND SCREEN  PREPARE RBC (CROSSMATCH)  PREPARE RBC (CROSSMATCH)    Imaging Review Dg Chest 1 View  03/24/2015   CLINICAL DATA:  Status post fall 03/23/2015.  Right hip fracture.  EXAM: CHEST  1 VIEW  COMPARISON:  Single view of the chest 01/06/2015. PA and lateral chest 01/04/2014.  FINDINGS: Heart size is upper normal. The lungs are clear. No pneumothorax or pleural effusion.  IMPRESSION: No acute disease.   Electronically Signed   By: Drusilla Kanner M.D.   On: 03/24/2015 07:32   Dg Hip Unilat  With Pelvis 2-3 Views Right  03/24/2015   CLINICAL DATA:  Fall, right hip pain  EXAM: RIGHT HIP (WITH PELVIS) 2-3 VIEWS  COMPARISON:  Radiograph 01/07/2015  FINDINGS: Acute intertrochanteric fracture of the proximal right femur. This avulsion of the lesser trochanter. Mild varus angulation. No dislocation. Internal fixation of left hip fracture noted  IMPRESSION: Acute right intertrochanteric femur fracture.   Electronically Signed   By: Genevive Bi M.D.   On: 03/24/2015 07:34     EKG Interpretation None       Discussed patient with Dr. Otelia Sergeant of [facility] who will be by to see the patient this AM, may take to the OR this afternoon. Plan to keep NPO.   MDM  Final diagnoses:  Pre-op chest exam  Closed right hip fracture,  initial encounter    Filed Vitals:   03/24/15 0945  BP: 127/49  Pulse: 64  Temp:   Resp: 12   Afebrile, NAD, non-toxic appearing, AAOx4.  Neurovascularly intact. Normal sensation. No evidence of compartment syndrome. Labs reviewed. Chronic anemia noted.  Patient with closed right hip fracture. Discussed case with [place] orthopedist Dr. Otelia Sergeant, who will take patient to the OR later this afternoon. [facility] hospitalist consultation for admission. Patient d/w with Dr. Hyacinth Meeker, agrees with plan.    Francee Piccolo, PA-C 03/24/15 1011  Eber Hong, MD 03/24/15 2207

## 2015-03-24 NOTE — Progress Notes (Signed)
Pt to OR, report given to RN in [place] stay holding area, informed pt has blood transfusion infusing, not yet complete.

## 2015-03-24 NOTE — Progress Notes (Signed)
Utilization review completed.  

## 2015-03-24 NOTE — ED Notes (Signed)
Patient presents via EMS after being discovered at the skilled nursing home in the floor beside the bed. Patient stated he rolled out of bed and hit the bedside table. He is complaining of right side hip pain and leg pain. Denies back pain. States he hit the top of his head but it was only a scrap. He denies hitting his head on the floor or a hard object. Denies LOC.

## 2015-03-24 NOTE — ED Notes (Signed)
Pt has returned from being out of the department; pt placed back on monitor, continuous pulse oximetry and blood pressure cuff 

## 2015-03-24 NOTE — Anesthesia Procedure Notes (Signed)
Procedure Name: Intubation Date/Time: 03/24/2015 7:18 PM Performed by: Gavin PoundLOWDER, Guida Asman J Pre-anesthesia Checklist: Patient identified, Emergency Drugs available, Suction available, Patient being monitored and Timeout performed Patient Re-evaluated:Patient Re-evaluated prior to inductionOxygen Delivery Method: Circle system utilized Preoxygenation: Pre-oxygenation with 100% oxygen Intubation Type: IV induction and Cricoid Pressure applied Laryngoscope Size: Mac and 4 Grade View: Grade I Number of attempts: 1 Placement Confirmation: ETT inserted through vocal cords under direct vision,  positive ETCO2,  CO2 detector and breath sounds checked- equal and bilateral Secured at: 21 cm Tube secured with: Tape Dental Injury: Teeth and Oropharynx as per pre-operative assessment

## 2015-03-24 NOTE — Brief Op Note (Signed)
03/24/2015  8:59 PM  PATIENT:  [name]  69 y.o. male  PRE-OPERATIVE DIAGNOSIS:  Right Intertroch Hip Fx  POST-OPERATIVE DIAGNOSIS:  Right Intertroch HIp Fx  PROCEDURE:  Procedure(s): CLOSED REDUCTION INTERNAL FIXATION (Right) Frie Trigen Intertan Smith-Nephew.  SURGEON:  Surgeon(s) and Role:  Kerrin ChampagneJames E Nitka, MD - Primary   ANESTHESIA:   general , Dr. Michelle Piperssey. EBL:  Total I/O In: 661 [Blood:661] Out: 500 [Urine:500]  BLOOD ADMINISTERED:250 CC PRBC  DRAINS: Urinary Catheter (Foley)   LOCAL MEDICATIONS USED:  NONE  SPECIMEN:  No Specimen  DISPOSITION OF SPECIMEN:  N/A  COUNTS:  YES  TOURNIQUET:  * No tourniquets in log *  DICTATION: .Dragon Dictation  PLAN OF CARE: Admit to inpatient   PATIENT DISPOSITION:  PACU - hemodynamically stable.   Delay start of Pharmacological VTE agent (>24hrs) due to surgical blood loss or risk of bleeding: no

## 2015-03-24 NOTE — Progress Notes (Signed)
Clinical Social Work Department BRIEF PSYCHOSOCIAL ASSESSMENT 03/24/2015  Patient:  [name],[name]     Account Number:  [id number]     Admit date:  03/24/2015  Clinical Social Worker:  Andres ShadNAIL,Demetri Kerman, LCSW  Date/Time:  03/24/2015 08:36 AM  Referred by:  Physician  Date Referred:  03/24/2015 Referred for  SNF Placement   Other Referral:   Patient is from a facility.   Interview type:  Patient Other interview type:   Chart Review  Called [facility] as patient is from facility    PSYCHOSOCIAL DATA Living Status:  FACILITY Admitted from [facility] Level of care:  Skilled Nursing Facility Primary support name:  [name] (Patient sister) Primary support relationship to patient:  FAMILY Degree of support available:   High level of support.  Sister involved in placement process back in January 2016.    CURRENT CONCERNS Current Concerns  Post-Acute Placement   Other Concerns:   NA    SOCIAL WORK ASSESSMENT / PLAN LCSW recieved consult in ED regarding patient being admitted from a SNF.  [facility] in January 2016 after a left hip fracture.  [facility] he [facility] out of bed and hit head on bedside table falling on his right hip.  Patient has another hip fracture on the Right Side.  Patient very pleansant and agreeable with plan to [facility] and return to SNF once medically stable. No family at the bedside currently, but patient reports he will be in touch with his sister.  [facility] called and notified of patient being admitted for hip fracture.  LCSW will update patient FL2 and transfer case to unit CSW who will assist patient will DC needs.   Assessment/plan status:  Psychosocial Support/Ongoing Assessment of Needs Other assessment/ plan:   NA   Information/referral to community resources:   Update Fl2    PATIENT'S/FAMILY'S RESPONSE TO PLAN OF CARE: Patient very  pleasant and cooperative this morning. Agreeable to plan and has no needs at this time. patient to call his sister and update her regarding status of his acute medical condition.  Patient familiar with SW role in hospital, no questions at this time.  Reports frustrations associated with another hip problem and needing more rehab after stabilized in the hospital.   Deretha EmoryHannah Yidel Teuscher LCSW, MSW Clinical Social Work: Emergency Room [phone]

## 2015-03-24 NOTE — Progress Notes (Signed)
Orthopedic Tech Progress Note Patient Details:  [name] NakaiMichael L [name] [birth year] [id number]  Ortho Devices Ortho Device/Splint Location: trapeze bar patient helper Ortho Device/Splint Interventions: Application   Nikki Domrawford, Amos Gaber 03/24/2015, 12:34 PM

## 2015-03-24 NOTE — Op Note (Signed)
03/24/2015  9:04 PM  PATIENT:  [name]  69 y.o. male  MRN: [id number]  OPERATIVE REPORT  PRE-OPERATIVE DIAGNOSIS:  Right Intertroch Hip Fx  POST-OPERATIVE DIAGNOSIS:  Right Intertroch HIp Fx  PROCEDURE:  Procedure(s): CLOSED REDUCTION INTERNAL FIXATION with Smith-Nephew Lattanzio Trigen InterTAN Intergrated-interlocking lag screw.    SURGEON:  Jessy Oto, MD       ANESTHESIA:  General, Dr. Conrad [place].  EBL: 500cc.    COMPLICATIONS:  None.     COMPONENTS:   Implant Name Type Inv. Item Serial No. Manufacturer Lot No. LRB No. Used  Trigen Intertain 10S Nail    [employer] [id number] Right 1  integrated interlocking lag screw    [employer] [id number] Right 1  SCREW TRIGEN LOW PROF 5.0X32.5 - [id number] Screw SCREW TRIGEN LOW PROF 5.0X32.5   [employer]   Right 1    PROCEDURE:The patient was met in the holding area, and the appropriate right hip identified and marked with an "X" and my initials. The patient was then transported to OR and was placed under anesthesia in supine position in his bed then transferred to the [facility] fracture table.  The patient received appropriate preoperative antibiotic prophylaxis ancef. The right foot and ankle well padded placed into longitudinal traction with leg brought into adduction. A groin post was used and the left leg placed in the well leg holder abducted to allow for C-arm evaluation of the right hip. A Wyke InterTAN nail was place over the proximal thigh and this provided adequate length to bridge the fracture site and indicated that reaming of the IM canal would not be necessary, a 11m distal tip width chosed. The right lower extremity was then prepped circumferentially about the right knee thigh hip to the lower rib margin laterally using DuraPrep solution. A iodine exclusion Vi-Drape was used for this case. C-arm fluoroscopy was used to ascertain reduction of the right intertrochanteric hip  fracture using abduction longitudinal traction and then internal rotation. Fracture reduced nicely into anatomic position and alignment. Following standard timeout protocol, then an incision was made using C-arm fluoroscopy guidance approximately 3 cm proximal to the tip of the greater trochanter in line with the right proximal femur. Incision through skin and subcutaneous layers the tensor fascia incised in line with the skin incision approximately 1.5 inch incision was used. The tip of the greater trochanter palpated and a cannulated soft tissue sleeve guided to the tip of the trochanter observed on C-arm fluoroscopy to be in good position alignment a 3.269mdrill pin was then carefully guided into the proximal femoral medullary canal from the tip of the trochanter. Guide pin for the InterTAN  system was then placed through the cannulated sleeve into the femoral channel and passed down to the middle of the proximal femoral intramedullary canal.  The proximal reamer it was then guided to the tip of the greater trochanter through the soft tissue sleeve protector and the reamer then used to ream the proximal intramedullary canal. This was reamed beyond the fracture site to distal to the lesser trochanter.  The nail was properly aligned the holes for the proximal lag screw directed to the lower half of the femoral neck and head. Using the insertion handle guide sleeves were then passed for the proximal lag screw down to the skin, the lateral C arm allowed for alignment of the anteversion for the proximal lag screw and a stab incision made using a 15 blade scalpel  through skin and subcutaneous layers spreading the subcutaneous layers down to bone using tonsil clamp. Sleeves were then passed down to bone with the centering sleeve for the drill for the lag screw down to bone. Carefully then the guide pin for the possible lag screw was then passed this was carefully placed into the middle portion of the head and neck on  the AP view and the midportion of the head and neck on lateral view. It was passed down to 16m [place] of subchondral bone and measured at 85 mm however 80 mm lag screw was chosen as the shorter length would allow for compression of the fracture site. 80 mm screw was chosen reaming to 85 mm. Note that first the superficial cortex drill and then the deeper drill for the antirotation screw were used to prepare for the anti rotation screw placement, then the antirotation slotted flange placed.Reaming for the proximal lag screw was  able to be carried out to about 85  mm under direct C-arm fluoroscopy this provided for a reaming to approximately 3 mm Dorsey of subchondral bone. With this then 851mlag screw was passed over the guide pin and carefully screwed into place down to 3 mm Carrigg of subchondral bone the handle placed parallel to the opening for the antirotation screw. Proximal locking mechanism for the lag screw was then tightened by releasing longitudinal traction of the right leg and engaging the proximal lag screw with the antirotation screw during the placement of the antirotation screw. This was tightened completely and and allow for dynamization of the proximal lag screw.The handles for insertion of the proximal lag screw and the antirotation screw were removed and the locking rods for the proximal lag screw and antirotation screw removed and then the insertion rods removed. The hole for placement of the distal interlocking screw with the insertion handle used to guide the drill sleeve to the lateral thigh. A stab incision with 15 blade scalpel and the incision carried to the lateral femur. The drill For the distal interlocking hole then passed and the length of screw measured at 32.5 mm. The drill and the inner drill sleeve removed and the 32.5 mm 4.87m51mcrew then placed through the soft tissue sleeve and placed across the distal nail Engaging the dynamized distal interlocking hole within the nail. With  this the insertion handle for the proximal lag screw was then removed with the sleeve and the insertion handle and guidepin.  Permanent images were then obtained AP and lateral planes lateral planes demonstrating the nail and proximal screws and distal screw in good position and alignment in APand lateral planes. This provided excellent fixation of thefracture. Irrigation was then carried out of all the incision sites after obtaining permanent C-arm images in AP and lateral planes. The stab incision distally was then closed with subcutaneous sutures of 2-0 Vicryl. The most proximal incision site was closed by irrigating first and approximating the tensor fascia lata with interrupted 0 Vicryl sutures on a UR 6 needle. Deep subcutaneous layers and the subcutaneous layers were approximated  with 2-0 Vicryl. Similarly the incision for the lag screw was closed with 0 Vicryl sutures in the deep layers then 2-0 Vicryl subcutaneous. Skin was then closed at each of the incision sites with stainless steel staples. Mepilex small bandages 2 in number were then applied to the lateral and the distal thigh area. All instrument and sponge counts were correct. Patient was then removed from longitudinal traction returned to his bed reactivated extubated  and returned to recovery room in satisfactory condition.   NITKA,JAMES E 03/24/2015, 9:04 PM

## 2015-03-24 NOTE — Progress Notes (Signed)
  Echocardiogram 2D Echocardiogram has been performed.  [name] FRANCES 03/24/2015, 10:02 AM

## 2015-03-24 NOTE — Consult Note (Signed)
Reason for Consult:Right Hip Fracture Referring Physician: Sabra Heck MD. Consulting Physician:NITKA,JAMES E  Orthopedic Diagnosis: Right closed intertrochanteric hip fracture Recent ORIF of left intertrochanteric hip fracture 01/08/2015 by Dr. Zollie Beckers. History of cirrhosis with pancytopenia mild coagulopathy and mild thrombocytopenia Pansystolic murmur with radiation into the neck and left neck greater than right. MRSA positive nasal swab this past admission Vertigo  ULA:GTXMIWO L [name] is an 69 y.o. male. With a history of DVTs mellitus pancytopenia with cirrhosis and mild coagulopathy. He has undergone recent left hip ORIF 01/08/2015 by my partner Dr. Zollie Beckers. He resides at [facility] and is apparently in the assisted living portion of the skilled nursing facility with twin beds without rails. Early this morning. He arose to the bathroom when he experienced vertigo and fell doing on his right hip had immediate pain and discomfort inability to stand or ambulate. He was transported to [facility] emergency room where radiographs demonstrated a closed displaced right intertrochanteric hip fracture his laboratory tests show a hemoglobin of 7.3 INR 1.67. He is admitted to medicine service for evaluation and treatment of anemia and mild coagulopathy as well as significant cardiac murmur and bruits of the carotid artery. He is seen today emergency room and we will plan to schedule for surgical treatment of this right hip for later this evening pending transfusion and treatment of his coagulopathy. His present coagulopathy is such that the reasonable candidate with INR 1.67.   Past Medical History  Diagnosis Date  . Hyperglycemia   . Hyponatremia   . DMII (diabetes mellitus, type 2)   . Depression   . IBS (irritable bowel syndrome)   . Chest pain   . Unspecified gastritis and gastroduodenitis without mention of hemorrhage   . Arthropathy, unspecified, site  unspecified   . History of colon polyps   . Anemia, unspecified   . Anxiety   . GERD (gastroesophageal reflux disease)   . Hyperlipidemia   . Hypertension     Past Surgical History  Procedure Laterality Date  . Hernia repair  1964  . Intramedullary (im) nail intertrochanteric Left 01/07/2015    Procedure: INTRAMEDULLARY (IM) NAIL INTERTROCHANTRIC;  Surgeon: Mcarthur Rossetti, MD;  Location: [facility];  Service: Orthopedics;  Laterality: Left;    Family History  Problem Relation Age of Onset  . Stroke Mother   . Heart attack Father   . Heart disease Father   . Cancer Other     FH of Breast Cancer-other Relative  . Heart disease Other     Parent  . Hypertension Other     parent  . Cancer Maternal Uncle     colon    Social History:  reports that he has quit smoking. His smoking use included Cigarettes. He smoked 1.00 pack per day. He does not have any smokeless tobacco history on file. He reports that he does not drink alcohol or use illicit drugs.  Allergies:  Allergies  Allergen Reactions  . Penicillins Hives    Medications: Prior to Admission:  (Not in a hospital admission) Scheduled:  PRN:  Results for orders placed or performed during the hospital encounter of 03/24/15 (from the past 48 hour(s))  Basic metabolic panel     Status: Abnormal   Collection Time: 03/24/15  6:40 AM  Result Value Ref Range   Sodium 138 135 - 145 mmol/L   Potassium 4.7 3.5 - 5.1 mmol/L   Chloride 110 96 - 112 mmol/L   CO2 25  19 - 32 mmol/L   Glucose, Bld 134 (H) 70 - 99 mg/dL   BUN 30 (H) 6 - 23 mg/dL   Creatinine, Ser 1.23 0.50 - 1.35 mg/dL   Calcium 8.5 8.4 - 10.5 mg/dL   GFR calc non Af Amer 59 (L) >90 mL/min   GFR calc Af Amer 68 (L) >90 mL/min    Comment: (NOTE) The eGFR has been calculated using the CKD EPI equation. This calculation has not been validated in all clinical situations. eGFR's persistently <90 mL/min signify possible Chronic Kidney Disease.    Anion gap 3  (L) 5 - 15  CBC WITH DIFFERENTIAL     Status: Abnormal   Collection Time: 03/24/15  6:40 AM  Result Value Ref Range   WBC 2.3 (L) 4.0 - 10.5 K/uL   RBC 2.52 (L) 4.22 - 5.81 MIL/uL   Hemoglobin 7.3 (L) 13.0 - 17.0 g/dL   HCT 23.2 (L) 39.0 - 52.0 %   MCV 92.1 78.0 - 100.0 fL   MCH 29.0 26.0 - 34.0 pg   MCHC 31.5 30.0 - 36.0 g/dL   RDW 16.8 (H) 11.5 - 15.5 %   Platelets 107 (L) 150 - 400 K/uL    Comment: REPEATED TO VERIFY PLATELET COUNT CONFIRMED BY SMEAR    Neutrophils Relative % 77 43 - 77 %   Neutro Abs 1.8 1.7 - 7.7 K/uL   Lymphocytes Relative 14 12 - 46 %   Lymphs Abs 0.3 (L) 0.7 - 4.0 K/uL   Monocytes Relative 7 3 - 12 %   Monocytes Absolute 0.2 0.1 - 1.0 K/uL   Eosinophils Relative 2 0 - 5 %   Eosinophils Absolute 0.0 0.0 - 0.7 K/uL   Basophils Relative 0 0 - 1 %   Basophils Absolute 0.0 0.0 - 0.1 K/uL  Protime-INR     Status: Abnormal   Collection Time: 03/24/15  6:40 AM  Result Value Ref Range   Prothrombin Time 19.6 (H) 11.6 - 15.2 seconds   INR 1.64 (H) 0.00 - 1.49  Type and screen     Status: None (Preliminary result)   Collection Time: 03/24/15  6:40 AM  Result Value Ref Range   ABO/RH(D) O POS    Antibody Screen NEG    Sample Expiration 03/27/2015    Unit Number [id number]    Blood Component Type RBC LR PHER2    Unit division 00    Status of Unit ISSUED    Transfusion Status OK TO TRANSFUSE    Crossmatch Result Compatible   Prepare RBC     Status: None   Collection Time: 03/24/15  8:13 AM  Result Value Ref Range   Order Confirmation ORDER PROCESSED BY BLOOD BANK     Dg Chest 1 View  03/24/2015   CLINICAL DATA:  Status post fall 03/23/2015.  Right hip fracture.  EXAM: CHEST  1 VIEW  COMPARISON:  Single view of the chest 01/06/2015. PA and lateral chest 01/04/2014.  FINDINGS: Heart size is upper normal. The lungs are clear. No pneumothorax or pleural effusion.  IMPRESSION: No acute disease.   Electronically Signed   By: Inge Rise M.D.   On:  03/24/2015 07:32   Dg Hip Unilat  With Pelvis 2-3 Views Right  03/24/2015   CLINICAL DATA:  Fall, right hip pain  EXAM: RIGHT HIP (WITH PELVIS) 2-3 VIEWS  COMPARISON:  Radiograph 01/07/2015  FINDINGS: Acute intertrochanteric fracture of the proximal right femur. This avulsion of the lesser trochanter. Mild  varus angulation. No dislocation. Internal fixation of left hip fracture noted  IMPRESSION: Acute right intertrochanteric femur fracture.   Electronically Signed   By: Suzy Bouchard M.D.   On: 03/24/2015 07:34    ROS Blood pressure 125/56, pulse 64, temperature 97.9 F (36.6 C), temperature source Oral, resp. rate 17, height 5' 6"  (1.676 m), weight 75.297 kg (166 lb), SpO2 100 %. Physical Exam's patient's awake alert and oriented 4. He is supine on the gurney room 10. He has a normal respirations not appear to be in acute distress. The right leg is externally rotated and the hip and knee. Right leg is shortened nearly 2 inches. Lung sounds are clear to auscultation cervical spine range of motion is supple without pain. Extremities without deformities normal range. Heart murmur grade 46 with radiation into both carotids and into the left axilla. Abdomen soft nontender with positive bowel sounds. Lower extremities are sensate warm dorsalis pedis pulses 2+ bilaterally posterior tibialis pulses not well felt. Motor in both lower extremities is normal.  Orthopaedic Exam: With external rotation of the right lower extremity and shortening of the right lower extremity by nearly 2 inches CS pain with any attempts at movement of the right hip. Plain radiographs showed displaced right intertrochanteric hip fracture.  Assessment/Plan: Closed right intertrochanteric hip fracture acute displaced. Anemia likely secondary to pancytopenia from cirrhosis. Coagulopathy also secondary to cirrhosis Heart murmur with radiation into the carotids as well as left axilla. Diabetes mellitus type 2 MRSA positive nasal  swab.  Plan: Patient is presently undergoing transfusion and we will transfuse his hemoglobin to 9.0 as he has had this level of hemoglobin on a chronic basis. He has very mild coagulopathy with INR 1.67. He likely can undergo the closed IM nail procedure with expected risk of an it necessity for blood transfusion being about 1-5%. Risk of infections about 1 at 200 we'll cover for MRSA with vancomycin and Ancef. Patient has signed his into after discussion of risks and benefits of procedure. Surgery scheduled for this evening after 5 PM he will remain nothing by mouth until that time.  NITKA,JAMES E 03/24/2015, 9:13 AM

## 2015-03-24 NOTE — ED Notes (Signed)
Ready to take pt up but doppler is at bedside.

## 2015-03-24 NOTE — Transfer of Care (Signed)
Immediate Anesthesia Transfer of Care Note  Patient: [name]  Procedure(s) Performed: Procedure(s): CLOSED REDUCTION INTERNAL FIXATION (Right)  Patient Location: PACU  Anesthesia Type:General  Level of Consciousness: awake  Airway & Oxygen Therapy: Patient Spontanous Breathing and Patient connected to nasal cannula oxygen  Post-op Assessment: Report given to RN and Post -op Vital signs reviewed and stable  Post vital signs: Reviewed and stable  Last Vitals:  Filed Vitals:   03/24/15 1655  BP: 150/54  Pulse: 68  Temp: 36.5 C  Resp: 18    Complications: No apparent anesthesia complications

## 2015-03-24 NOTE — ED Provider Notes (Signed)
The patient is a pleasant 69 year old male who is approximately one month status post left hip surgery from a fracture who rolled out of bed last night falling on his right hip, he has a shortened externally rotated right lower extremity, pain with internal and external rotation, soft nontender abdomen, clear lung sounds, loud systolic murmur, no signs of upper extremity injury or significant head injury. Anticipate lower extremity hip fracture, imaging pending, preop labs ordered.  Medical screening examination/treatment/procedure(s) were conducted as a shared visit with non-physician practitioner(s) and myself.  I personally evaluated the patient during the encounter.  Clinical Impression:   Final diagnoses:  Pre-op chest exam  Closed right hip fracture, initial encounter         Eber HongBrian Everlena Mackley, MD 03/24/15 2207

## 2015-03-24 NOTE — Progress Notes (Signed)
Report given to Debbie, RN.

## 2015-03-24 NOTE — Progress Notes (Signed)
Mr. Malachi BondsShort reports that he received penicillin while being treated for blood poisoning as a child, he took the medication developed hives and was hospitalized for the allergic reaction. Would not recommend for giving cephalosporins with this history

## 2015-03-24 NOTE — Anesthesia Postprocedure Evaluation (Signed)
Anesthesia Post Note  Patient: [name] NakaiMichael L Gupta  Procedure(s) Performed: Procedure(s) (LRB): CLOSED REDUCTION INTERNAL FIXATION (Right)  Anesthesia type: general  Patient location: PACU  Post pain: Pain level controlled  Post assessment: Patient's Cardiovascular Status Stable  Last Vitals:  Filed Vitals:   03/24/15 2145  BP: 159/60  Pulse: 77  Temp: 36.6 C  Resp: 19    Post vital signs: Reviewed and stable  Level of consciousness: sedated  Complications: No apparent anesthesia complications

## 2015-03-24 NOTE — Discharge Instructions (Signed)
Keep hip incision dry for 5 days post op then may wet while bathing. °Therapy daily . °Call if fever or chills or increased drainage. °Go to ER if acutely [place] of breath or call for ambulance. °Return for follow up in 2 weeks. °May full weight bear on the surgical leg unless told otherwise. °In house walking for first 2 weeks.  ° °

## 2015-03-25 DIAGNOSIS — E039 Hypothyroidism, unspecified: Secondary | ICD-10-CM

## 2015-03-25 DIAGNOSIS — I10 Essential (primary) hypertension: Secondary | ICD-10-CM

## 2015-03-25 DIAGNOSIS — E785 Hyperlipidemia, unspecified: Secondary | ICD-10-CM

## 2015-03-25 DIAGNOSIS — S72001S Fracture of unspecified part of neck of right femur, sequela: Secondary | ICD-10-CM

## 2015-03-25 LAB — BASIC METABOLIC PANEL
Anion gap: 7 (ref 5–15)
BUN: 30 mg/dL — AB (ref 6–23)
CO2: 20 mmol/L (ref 19–32)
Calcium: 7.9 mg/dL — ABNORMAL LOW (ref 8.4–10.5)
Chloride: 109 mmol/L (ref 96–112)
Creatinine, Ser: 0.99 mg/dL (ref 0.50–1.35)
GFR calc Af Amer: >90 mL/min (ref >90)
GFR, EST NON AFRICAN AMERICAN: 83 mL/min — AB (ref >90)
Glucose, Bld: 182 mg/dL — ABNORMAL HIGH (ref 70–99)
POTASSIUM: 4.5 mmol/L (ref 3.5–5.1)
SODIUM: 136 mmol/L (ref 135–145)

## 2015-03-25 LAB — CBC
HEMATOCRIT: 30.7 % — AB (ref 39.0–52.0)
Hemoglobin: 10.2 g/dL — ABNORMAL LOW (ref 13.0–17.0)
MCH: 29.4 pg (ref 26.0–34.0)
MCHC: 33.2 g/dL (ref 30.0–36.0)
MCV: 88.5 fL (ref 78.0–100.0)
Platelets: 135 10*3/uL — ABNORMAL LOW (ref 150–400)
RBC: 3.47 MIL/uL — ABNORMAL LOW (ref 4.22–5.81)
RDW: 17 % — AB (ref 11.5–15.5)
WBC: 8.2 10*3/uL (ref 4.0–10.5)

## 2015-03-25 LAB — TYPE AND SCREEN
ABO/RH(D): O POS
ANTIBODY SCREEN: NEGATIVE
UNIT DIVISION: 0
UNIT DIVISION: 0
Unit division: 0
Unit division: 0

## 2015-03-25 LAB — GLUCOSE, CAPILLARY
Glucose-Capillary: 148 mg/dL — ABNORMAL HIGH (ref 70–99)
Glucose-Capillary: 148 mg/dL — ABNORMAL HIGH (ref 70–99)
Glucose-Capillary: 172 mg/dL — ABNORMAL HIGH (ref 70–99)
Glucose-Capillary: 173 mg/dL — ABNORMAL HIGH (ref 70–99)
Glucose-Capillary: 184 mg/dL — ABNORMAL HIGH (ref 70–99)
Glucose-Capillary: 187 mg/dL — ABNORMAL HIGH (ref 70–99)

## 2015-03-25 NOTE — Progress Notes (Signed)
Patient ID: [name] NakaiMichael L [name], male   DOB: [birth year], 69 y.o.   MRN: [id number]     Subjective: 1 Day Post-Op Procedure(s) (LRB): CLOSED REDUCTION INTERNAL FIXATION (Right) Awake, alert and oriented x 4. Patient reports pain as moderate.    Objective:   VITALS:  Temp:  [97.6 F (36.4 C)-98.6 F (37 C)] 97.9 F (36.6 C) (04/12 0639) Pulse Rate:  [64-80] 77 (04/12 0639) Resp:  [11-20] 16 (04/12 0337) BP: (112-159)/(48-67) 140/59 mmHg (04/12 0639) SpO2:  [93 %-100 %] 100 % (04/12 [id number])  Neurologically intact ABD soft Neurovascular intact Sensation intact distally Intact pulses distally Dorsiflexion/Plantar flexion intact Incision: no drainage Moderate right thigh swelling. Need to keep heels off bed when in bed as his ability to move right hip is impaired due to lesser and greater Trochanter portion of hip fracture.  LABS  Recent Labs  03/24/15 0640 03/24/15 1719 03/24/15 2144 03/25/15 0546  HGB 7.3* 8.2* 10.9* 10.2*  WBC 2.3*  --  7.3 8.2  PLT 107*  --  116* 135*    Recent Labs  03/24/15 0640 03/24/15 1719 03/25/15 0546  NA 138 142 136  K 4.7 5.0 4.5  CL 110  --  109  CO2 25  --  20  BUN 30*  --  30*  CREATININE 1.23  --  0.99  GLUCOSE 134* 119* 182*    Recent Labs  03/24/15 0640 03/24/15 1354  INR 1.64* 1.62*     Assessment/Plan: 1 Day Post-Op Procedure(s) (LRB): CLOSED REDUCTION INTERNAL FIXATION (Right)  Advance diet Up with therapy Discharge to SNF when stable, likely Thurs or Friday.  NITKA,JAMES E 03/25/2015, 7:59 AM

## 2015-03-25 NOTE — Care Management Note (Signed)
CARE MANAGEMENT NOTE 03/25/2015  Patient:  Cher NakaiSHORT,Quinterious L   Account Number:  [id number]  Date Initiated:  03/25/2015  Documentation initiated by:  Vance PeperBRADY,Avraham Benish  Subjective/Objective Assessment:   69 yr old male admitted s/p fall with a right hip fracture. Patient underwent a closed reduction with internal fixation.     Action/Plan:   [facility] and plans to return for shortterm rehab. Social worker is aware.   Anticipated DC Date:  03/27/2015   Anticipated DC Plan:  SKILLED NURSING FACILITY  In-house referral  Clinical Social Worker      DC Planning Services  CM consult      Choice offered to / List presented to:     DME arranged  NA        HH arranged  NA      Status of service:  Completed, signed off Medicare Important Message given?   (If response is "NO", the following Medicare IM given date fields will be blank) Date Medicare IM given:   Medicare IM given by:   Date Additional Medicare IM given:   Additional Medicare IM given by:    Discharge Disposition:  SKILLED NURSING FACILITY  Per UR Regulation:  Reviewed for med. necessity/level of care/duration of stay

## 2015-03-25 NOTE — Progress Notes (Signed)
OT Cancellation Note  Patient Details Name: [name] MRN: [id number] DOB: [birth year]   Cancelled Treatment:    Reason Eval/Treat Not Completed: Other (comment) Pt is Medicare and current D/C plan is return to SNF. No apparent immediate acute care OT needs, therefore will defer OT to SNF. If OT eval is needed please call Acute Rehab Dept. at [phone] or text page OT at [phone].  [facility] CenterWARD,HILLARY  [name], OTR/L  [phone] 03/25/2015 03/25/2015, 9:45 PM

## 2015-03-25 NOTE — Evaluation (Signed)
Physical Therapy Evaluation Patient Details Name: [name] MRN: [id number] DOB: 10/06/1946 Today's Date: 03/25/2015   History of Present Illness  69 y.o. male [facility] from SNF where he was finishing rehab for his left hip fx and ORIF. He underwent CRIF of his right hip after falling at SNF.  Pt with significant PMHx of DM2, depression, anemia, anxiety, HTN, and IM Nail left femur 12/2014.   Clinical Impression  Pt is max assist to mobilize and stand EOB.  He continues to be appropriate for return to SNF for rehab.   PT to follow acutely for deficits listed below.       Follow Up Recommendations SNF    Equipment Recommendations  None recommended by PT    Recommendations for Other Services   NA    Precautions / Restrictions Precautions Precautions: Fall Restrictions RLE Weight Bearing: Weight bearing as tolerated      Mobility  Bed Mobility Overal bed mobility: Needs Assistance Bed Mobility: Supine to Sit;Sit to Supine     Supine to sit: Max assist Sit to supine: Max assist   General bed mobility comments: Max assist to help progress legs, hips and trunk to upright sitting from elevated HOB.  Pt very guarded and stiff throughout trunk and bil legs during transitions.  Verbal cues for hand placement and to try to relax to decrease stain.   Transfers Overall transfer level: Needs assistance Equipment used: Rolling walker (2 wheeled) Transfers: Sit to/from Stand Sit to Stand: Max assist;From elevated surface         General transfer comment: Max assist to support trunk and stabilize RW during transitions, once standing, less assist needed.    Ambulation/Gait             General Gait Details: not tested, pt unable at this time.          Balance Overall balance assessment: Needs assistance Sitting-balance support: Feet supported;Bilateral upper extremity supported Sitting balance-Leahy Scale: Poor     Standing balance support:  Bilateral upper extremity supported Standing balance-Leahy Scale: Poor Standing balance comment: Pt min assist once standing and stabilized by RW with bil arms, but unable to stand >45 sec- 1 min                             Pertinent Vitals/Pain Pain Assessment: Faces Faces Pain Scale: Hurts whole lot Pain Location: right leg/hip Pain Descriptors / Indicators: Grimacing;Guarding Pain Intervention(s): Limited activity within patient's tolerance;Monitored during session;Repositioned;Ice applied    Home Living Family/patient expects to be discharged to:: Skilled nursing facility                      Prior Function Level of Independence: Needs assistance   Gait / Transfers Assistance Needed: ambulated with RW and assist at SNF for rehab              Extremity/Trunk Assessment   Upper Extremity Assessment: Overall WFL for tasks assessed           Lower Extremity Assessment: RLE deficits/detail;LLE deficits/detail RLE Deficits / Details: right leg with normal post op pain and weakness, pt very guarded and stiff in his leg with ROM exercise in bed before mobilizing EOB.  Ankle 3/5, knee 3/5, hip 2+/5 LLE Deficits / Details: left leg stiff as well with limited active ROM likely due to recent recovery from hip fx and IM nail here.  Ankle WNL, knee 3/5, hip 3/5  Cervical / Trunk Assessment: Other exceptions  Communication   Communication: No difficulties  Cognition Arousal/Alertness: Awake/alert Behavior During Therapy: WFL for tasks assessed/performed Overall Cognitive Status: Within Functional Limits for tasks assessed                         Exercises Total Joint Exercises Ankle Circles/Pumps: AAROM;Both;10 reps;Supine Heel Slides: AAROM;Right;10 reps;Supine Hip ABduction/ADduction: AAROM;Right;10 reps;Supine      Assessment/Plan    PT Assessment Patient needs continued PT services  PT Diagnosis Difficulty walking;Abnormality of  gait;Generalized weakness;Acute pain   PT Problem List Decreased strength;Decreased range of motion;Decreased activity tolerance;Decreased balance;Decreased mobility;Decreased knowledge of use of DME;Pain  PT Treatment Interventions DME instruction;Gait training;Stair training;Functional mobility training;Therapeutic activities;Therapeutic exercise;Balance training;Neuromuscular re-education;Patient/family education;Manual techniques;Modalities   PT Goals (Current goals can be found in the Care Plan section) Acute Rehab PT Goals Patient Stated Goal: to go back to rehab and get better so he doesn't have to live at a SNF PT Goal Formulation: With patient Time For Goal Achievement: 04/01/15 Potential to Achieve Goals: Good    Frequency Min 3X/week        End of Session   Activity Tolerance: Patient limited by pain Patient left: in bed;with call bell/phone within reach;with bed alarm set Nurse Communication: Mobility status         Time: 1245-1316 PT Time Calculation (min) (ACUTE ONLY): 31 min   Charges:   PT Evaluation $Initial PT Evaluation Tier I: 1 Procedure PT Treatments $Therapeutic Activity: 8-22 mins        [name] B. [name], PT, DPT [phone]   03/25/2015, 1:37 PM

## 2015-03-25 NOTE — Progress Notes (Signed)
[facility] PROGRESS NOTE  [name] NakaiMichael L [name] RUE:[id number] DOB: 10-Feb-1946 DOA: 03/24/2015 PCP: Margit HanksALEXANDER, [name], MD  Assessment/Plan: Principal Problem:   Closed intertrochanteric fracture of right hip - Ortho on board pt is s/p 1 day post-op closed reduction internal fixation (right) - PT evaluation - continue supportive therapy  Active Problems:   Anxiety: stable - stable on prozac    Hyperlipidemia - stable continue statin    Hypertension - Pt currently on losartan, relatively well controlled    DM (diabetes mellitus), type 2 with complications - Carb modified diet - Continue lantus and SSI    Hypothyroidism - stable on synthroid  Hx of Carotid Stenosis with new Cardiac Murmur Carotid U/S in 2013 showed 40- 59% bilateral ICA stenosis. 3/6 systolic murmur heard best at right sternal boarder. 2D echo and Carotid dopplers ordered as part of pre surgical clearance.  Code Status: DNR Family Communication: Discussed directly with patient Disposition Plan: Pending PT evaluation recommendations   Consultants:  Orthopedic surgery  Procedures:  As listed above  Antibiotics - Doxycycline  HPI/Subjective: Pt has no new complaints.  Objective: Filed Vitals:   03/25/15 0639  BP: 140/59  Pulse: 77  Temp: 97.9 F (36.6 C)  Resp:     Intake/Output Summary (Last 24 hours) at 03/25/15 1554 Last data filed at 03/25/15 1500  Gross per 24 hour  Intake   1696 ml  Output   1600 ml  Net     96 ml   Filed Weights   03/24/15 0553  Weight: 75.297 kg (166 lb)    Exam:   General:  Pt in nad, alert and awake  Cardiovascular: rrr, no rubs  Respiratory: cta bl, no wheezes  Abdomen: soft, NT, nd  Musculoskeletal: no clubbing normal tone   Data Reviewed: Basic Metabolic Panel:  Recent Labs Lab 03/24/15 0640 03/24/15 1719 03/25/15 0546  NA 138 142 136  K 4.7 5.0 4.5  CL 110  --  109  CO2 25  --  20  GLUCOSE 134* 119* 182*  BUN 30*  --  30*   CREATININE 1.23  --  0.99  CALCIUM 8.5  --  7.9*   Liver Function Tests: No results for input(s): AST, ALT, ALKPHOS, BILITOT, PROT, ALBUMIN in the last 168 hours. No results for input(s): LIPASE, AMYLASE in the last 168 hours. No results for input(s): AMMONIA in the last 168 hours. CBC:  Recent Labs Lab 03/24/15 0640 03/24/15 1719 03/24/15 2144 03/25/15 0546  WBC 2.3*  --  7.3 8.2  NEUTROABS 1.8  --   --   --   HGB 7.3* 8.2* 10.9* 10.2*  HCT 23.2* 24.0* 33.8* 30.7*  MCV 92.1  --  89.9 88.5  PLT 107*  --  116* 135*   Cardiac Enzymes: No results for input(s): CKTOTAL, CKMB, CKMBINDEX, TROPONINI in the last 168 hours. BNP (last 3 results) No results for input(s): BNP in the last 8760 hours.  ProBNP (last 3 results) No results for input(s): PROBNP in the last 8760 hours.  CBG:  Recent Labs Lab 03/24/15 2338 03/25/15 0030 03/25/15 0400 03/25/15 0804 03/25/15 1213  GLUCAP 170* 173* 184* 148* 148*    Recent Results (from the past 240 hour(s))  MRSA PCR Screening     Status: None   Collection Time: 03/24/15  1:30 PM  Result Value Ref Range Status   MRSA by PCR NEGATIVE NEGATIVE Final    Comment:        The GeneXpert MRSA Assay (  FDA approved for NASAL specimens only), is one component of a comprehensive MRSA colonization surveillance program. It is not intended to diagnose MRSA infection nor to guide or monitor treatment for MRSA infections.      Studies: Dg Chest 1 View  03/24/2015   CLINICAL DATA:  Status post fall 03/23/2015.  Right hip fracture.  EXAM: CHEST  1 VIEW  COMPARISON:  Single view of the chest 01/06/2015. PA and lateral chest 01/04/2014.  FINDINGS: Heart size is upper normal. The lungs are clear. No pneumothorax or pleural effusion.  IMPRESSION: No acute disease.   Electronically Signed   By: Drusilla Kanner M.D.   On: 03/24/2015 07:32   Dg Hip Operative Unilat With Pelvis Right  03/24/2015   CLINICAL DATA:  Right hip fixation  EXAM: OPERATIVE  right HIP (WITH PELVIS IF PERFORMED)  VIEWS  TECHNIQUE: Fluoroscopic spot image(s) were submitted for interpretation post-operatively.  FLUOROSCOPY TIME:  Fluoroscopy time: 1 min 5 seconds  : COMPARISON:  Same date.  FINDINGS: Evidence of dynamic right femoral nail fixation of previously seen intertrochanteric hip fracture. Fracture fragments are in near anatomic alignment. No evidence for hardware failure.  IMPRESSION: Expected intraoperative appearance after right femoral ORIF.   Electronically Signed   By: Christiana Pellant M.D.   On: 03/24/2015 21:43   Dg Hip Unilat  With Pelvis 2-3 Views Right  03/24/2015   CLINICAL DATA:  Fall, right hip pain  EXAM: RIGHT HIP (WITH PELVIS) 2-3 VIEWS  COMPARISON:  Radiograph 01/07/2015  FINDINGS: Acute intertrochanteric fracture of the proximal right femur. This avulsion of the lesser trochanter. Mild varus angulation. No dislocation. Internal fixation of left hip fracture noted  IMPRESSION: Acute right intertrochanteric femur fracture.   Electronically Signed   By: Genevive Bi M.D.   On: 03/24/2015 07:34    Scheduled Meds: . sodium chloride   Intravenous Once  . aspirin EC  325 mg Oral Q breakfast  . cholecalciferol  1,000 Units Oral Daily  . doxycycline  100 mg Oral BID  . FLUoxetine  60 mg Oral Daily  . hydrocortisone   Rectal BID  . insulin aspart  0-9 Units Subcutaneous 6 times per day  . insulin glargine  30 Units Subcutaneous BID  . lamoTRIgine  50 mg Oral BID  . levothyroxine  25 mcg Oral QAC breakfast  . losartan  25 mg Oral Daily  . pantoprazole  40 mg Oral Daily  . simvastatin  40 mg Oral QHS  . tamsulosin  0.4 mg Oral Daily  . traZODone  75 mg Oral QHS   Continuous Infusions: . sodium chloride 75 mL/hr at 03/25/15 1510  . lactated ringers 20 mL/hr at 03/24/15 1628    Time spent: > 35 minutes    [name]  [facility] Pager [phone] If 7PM-7AM, please contact night-coverage at [email or web address], password [facility] 03/25/2015,  3:54 PM  LOS: 1 day

## 2015-03-26 LAB — GLUCOSE, CAPILLARY
GLUCOSE-CAPILLARY: 123 mg/dL — AB (ref 70–99)
GLUCOSE-CAPILLARY: 144 mg/dL — AB (ref 70–99)
Glucose-Capillary: 101 mg/dL — ABNORMAL HIGH (ref 70–99)
Glucose-Capillary: 120 mg/dL — ABNORMAL HIGH (ref 70–99)
Glucose-Capillary: 141 mg/dL — ABNORMAL HIGH (ref 70–99)
Glucose-Capillary: 147 mg/dL — ABNORMAL HIGH (ref 70–99)
Glucose-Capillary: 96 mg/dL (ref 70–99)

## 2015-03-26 LAB — CBC WITH DIFFERENTIAL/PLATELET
BASOS PCT: 0 % (ref 0–1)
Basophils Absolute: 0 10*3/uL (ref 0.0–0.1)
EOS ABS: 0.1 10*3/uL (ref 0.0–0.7)
EOS PCT: 3 % (ref 0–5)
HEMATOCRIT: 25.2 % — AB (ref 39.0–52.0)
Hemoglobin: 8.3 g/dL — ABNORMAL LOW (ref 13.0–17.0)
Lymphocytes Relative: 13 % (ref 12–46)
Lymphs Abs: 0.5 10*3/uL — ABNORMAL LOW (ref 0.7–4.0)
MCH: 29.7 pg (ref 26.0–34.0)
MCHC: 32.9 g/dL (ref 30.0–36.0)
MCV: 90.3 fL (ref 78.0–100.0)
MONO ABS: 0.5 10*3/uL (ref 0.1–1.0)
Monocytes Relative: 12 % (ref 3–12)
Neutro Abs: 2.9 10*3/uL (ref 1.7–7.7)
Neutrophils Relative %: 72 % (ref 43–77)
PLATELETS: 84 10*3/uL — AB (ref 150–400)
RBC: 2.79 MIL/uL — ABNORMAL LOW (ref 4.22–5.81)
RDW: 17.1 % — AB (ref 11.5–15.5)
WBC: 4 10*3/uL (ref 4.0–10.5)

## 2015-03-26 NOTE — Progress Notes (Signed)
Patient ID: [name] NakaiMichael L Laver, male   DOB: December 22, 1945, 69 y.o.   MRN: [id number] Bladder scan showing >500 cc urine retained. Will have nursing place foley if residual greater than 300 cc He has a history of urinary retention at his last admit for left hip fracture 12/2014.

## 2015-03-26 NOTE — Progress Notes (Signed)
Pt can't urinate on own. In and out cathed at 0430. May need Flomax ordered.

## 2015-03-26 NOTE — Progress Notes (Signed)
Patient ID: [name], male   DOB: 06/08/1946, 69 y.o.   MRN: [id number]     Subjective: 2 Days Post-Op Procedure(s) (LRB): CLOSED REDUCTION INTERNAL FIXATION (Right) Awake alert and oriented x 4. Pain levels moderate. Tolerating po meds and food. Patient reports pain as moderate.    Objective:   VITALS:  Temp:  [97.2 F (36.2 C)-97.4 F (36.3 C)] 97.2 F (36.2 C) (04/13 0446) Pulse Rate:  [68-72] 68 (04/13 0446) Resp:  [18] 18 (04/13 0446) BP: (140-142)/(59-64) 142/59 mmHg (04/13 0446) SpO2:  [100 %] 100 % (04/13 0446)  Neurologically intact ABD soft Neurovascular intact Sensation intact distally Intact pulses distally Dorsiflexion/Plantar flexion intact Incision: dressing C/D/I No cellulitis present   LABS  Recent Labs  03/24/15 0640 03/24/15 1719 03/24/15 2144 03/25/15 0546 03/26/15 0534  HGB 7.3* 8.2* 10.9* 10.2* 8.3*  WBC 2.3*  --  7.3 8.2 4.0  PLT 107*  --  116* 135* 84*    Recent Labs  03/24/15 0640 03/24/15 1719 03/25/15 0546  NA 138 142 136  K 4.7 5.0 4.5  CL 110  --  109  CO2 25  --  20  BUN 30*  --  30*  CREATININE 1.23  --  0.99  GLUCOSE 134* 119* 182*    Recent Labs  03/24/15 0640 03/24/15 1354  INR 1.64* 1.62*     Assessment/Plan: 2 Days Post-Op Procedure(s) (LRB): CLOSED REDUCTION INTERNAL FIXATION (Right)  Advance diet Up with therapy D/C IV fluids Plan for discharge tomorrow Discharge to SNF  NITKA,JAMES E 03/26/2015, 8:55 AM

## 2015-03-26 NOTE — Progress Notes (Signed)
[facility] PROGRESS NOTE  [name] NakaiMichael L [name] ZOX:[id number] DOB: 06-09-1946 DOA: 03/24/2015 PCP: Margit HanksALEXANDER, [name], MD  Assessment/Plan: Principal Problem:   Closed intertrochanteric fracture of right hip - Ortho on board pt is s/p 2 day post-op closed reduction internal fixation (right) - PT evaluation recommends SNF on d/c - continue supportive therapy  Active Problems:   Anxiety: stable - stable on prozac    Hyperlipidemia - stable continue statin    Hypertension - Pt currently on losartan, relatively well controlled    DM (diabetes mellitus), type 2 with complications - Carb modified diet - Continue lantus and SSI    Hypothyroidism - stable on synthroid  Hx of Carotid Stenosis with new Cardiac Murmur Carotid U/S in 2013 showed 40- 59% bilateral ICA stenosis. 3/6 systolic murmur heard best at right sternal boarder. 2D echo and Carotid dopplers ordered as part of pre surgical clearance.  Code Status: DNR Family Communication: Discussed directly with patient Disposition Plan: SNF most likely next am.   Consultants:  Orthopedic surgery  Procedures:  As listed above  Antibiotics - Doxycycline  HPI/Subjective: Pt has no new complaints, reports no acute medical problems.  Objective: Filed Vitals:   03/26/15 1400  BP: 103/45  Pulse: 72  Temp: 98.4 F (36.9 C)  Resp: 18    Intake/Output Summary (Last 24 hours) at 03/26/15 1636 Last data filed at 03/26/15 0430  Gross per 24 hour  Intake    120 ml  Output    400 ml  Net   -280 ml   Filed Weights   03/24/15 0553  Weight: 75.297 kg (166 lb)    Exam:   General:  Pt in nad, alert and awake  Cardiovascular: rrr, no rubs  Respiratory: cta bl, no wheezes  Abdomen: soft, NT, nd  Musculoskeletal: no clubbing normal tone   Data Reviewed: Basic Metabolic Panel:  Recent Labs Lab 03/24/15 0640 03/24/15 1719 03/25/15 0546  NA 138 142 136  K 4.7 5.0 4.5  CL 110  --  109  CO2 25  --  20   GLUCOSE 134* 119* 182*  BUN 30*  --  30*  CREATININE 1.23  --  0.99  CALCIUM 8.5  --  7.9*   Liver Function Tests: No results for input(s): AST, ALT, ALKPHOS, BILITOT, PROT, ALBUMIN in the last 168 hours. No results for input(s): LIPASE, AMYLASE in the last 168 hours. No results for input(s): AMMONIA in the last 168 hours. CBC:  Recent Labs Lab 03/24/15 0640 03/24/15 1719 03/24/15 2144 03/25/15 0546 03/26/15 0534  WBC 2.3*  --  7.3 8.2 4.0  NEUTROABS 1.8  --   --   --  2.9  HGB 7.3* 8.2* 10.9* 10.2* 8.3*  HCT 23.2* 24.0* 33.8* 30.7* 25.2*  MCV 92.1  --  89.9 88.5 90.3  PLT 107*  --  116* 135* 84*   Cardiac Enzymes: No results for input(s): CKTOTAL, CKMB, CKMBINDEX, TROPONINI in the last 168 hours. BNP (last 3 results) No results for input(s): BNP in the last 8760 hours.  ProBNP (last 3 results) No results for input(s): PROBNP in the last 8760 hours.  CBG:  Recent Labs Lab 03/26/15 0023 03/26/15 0406 03/26/15 0805 03/26/15 1155 03/26/15 1614  GLUCAP 123* 96 101* 141* 144*    Recent Results (from the past 240 hour(s))  MRSA PCR Screening     Status: None   Collection Time: 03/24/15  1:30 PM  Result Value Ref Range Status   MRSA by PCR  NEGATIVE NEGATIVE Final    Comment:        The GeneXpert MRSA Assay (FDA approved for NASAL specimens only), is one component of a comprehensive MRSA colonization surveillance program. It is not intended to diagnose MRSA infection nor to guide or monitor treatment for MRSA infections.      Studies: Dg Hip Operative Unilat With Pelvis Right  03/24/2015   CLINICAL DATA:  Right hip fixation  EXAM: OPERATIVE right HIP (WITH PELVIS IF PERFORMED)  VIEWS  TECHNIQUE: Fluoroscopic spot image(s) were submitted for interpretation post-operatively.  FLUOROSCOPY TIME:  Fluoroscopy time: 1 min 5 seconds  : COMPARISON:  Same date.  FINDINGS: Evidence of dynamic right femoral nail fixation of previously seen intertrochanteric hip  fracture. Fracture fragments are in near anatomic alignment. No evidence for hardware failure.  IMPRESSION: Expected intraoperative appearance after right femoral ORIF.   Electronically Signed   By: Christiana Pellant M.D.   On: 03/24/2015 21:43    Scheduled Meds: . sodium chloride   Intravenous Once  . aspirin EC  325 mg Oral Q breakfast  . cholecalciferol  1,000 Units Oral Daily  . doxycycline  100 mg Oral BID  . FLUoxetine  60 mg Oral Daily  . hydrocortisone   Rectal BID  . insulin aspart  0-9 Units Subcutaneous 6 times per day  . insulin glargine  30 Units Subcutaneous BID  . lamoTRIgine  50 mg Oral BID  . levothyroxine  25 mcg Oral QAC breakfast  . losartan  25 mg Oral Daily  . pantoprazole  40 mg Oral Daily  . simvastatin  40 mg Oral QHS  . tamsulosin  0.4 mg Oral Daily  . traZODone  75 mg Oral QHS   Continuous Infusions: . sodium chloride 75 mL/hr at 03/25/15 1510  . lactated ringers 20 mL/hr at 03/24/15 1628    Time spent: > 35 minutes    [name]  [facility] Pager [phone] If 7PM-7AM, please contact night-coverage at [email or web address], password [facility] 03/26/2015, 4:36 PM  LOS: 2 days

## 2015-03-27 ENCOUNTER — Non-Acute Institutional Stay (SKILLED_NURSING_FACILITY): Payer: Medicare Other | Admitting: Internal Medicine

## 2015-03-27 ENCOUNTER — Encounter: Payer: Self-pay | Admitting: Internal Medicine

## 2015-03-27 DIAGNOSIS — S72141S Displaced intertrochanteric fracture of right femur, sequela: Secondary | ICD-10-CM | POA: Diagnosis not present

## 2015-03-27 DIAGNOSIS — I10 Essential (primary) hypertension: Secondary | ICD-10-CM | POA: Diagnosis not present

## 2015-03-27 DIAGNOSIS — E118 Type 2 diabetes mellitus with unspecified complications: Secondary | ICD-10-CM | POA: Diagnosis not present

## 2015-03-27 LAB — GLUCOSE, CAPILLARY
GLUCOSE-CAPILLARY: 77 mg/dL (ref 70–99)
GLUCOSE-CAPILLARY: 85 mg/dL (ref 70–99)
Glucose-Capillary: 60 mg/dL — ABNORMAL LOW (ref 70–99)
Glucose-Capillary: 131 mg/dL — ABNORMAL HIGH (ref 70–99)

## 2015-03-27 MED ORDER — HYDROCODONE-ACETAMINOPHEN 5-325 MG PO TABS: 1.0000 | ORAL_TABLET | Freq: Four times a day (QID) | ORAL | Status: DC | PRN

## 2015-03-27 MED ORDER — INSULIN GLARGINE 100 UNIT/ML ~~LOC~~ SOLN: 30.0000 [IU] | Freq: Two times a day (BID) | SUBCUTANEOUS | Status: DC

## 2015-03-27 NOTE — Assessment & Plan Note (Signed)
due to soft BP's will hold ARB on discharge; continue lisinopril 5 mg

## 2015-03-27 NOTE — Progress Notes (Signed)
Physical Therapy Treatment Patient Details Name: [name] MRN: [id number] DOB: 10-28-1946 Today's Date: 03/27/2015    History of Present Illness 69 y.o. male admitted to [facility] on 03/24/15 from SNF where he was finishing rehab for his left hip fx and ORIF. He underwent CRIF of his right hip after falling at SNF.  Pt with significant PMHx of DM2, depression, anemia, anxiety, HTN, and IM Nail left femur 12/2014.     PT Comments    Patient progressing with mobility this session with less stiffness and less assist as during last session.  Still needs multimodal cues for decreased stiffness, breathing, increased participation.  Will need SNF rehab upon d/c.  Follow Up Recommendations  SNF     Equipment Recommendations  None recommended by PT    Recommendations for Other Services       Precautions / Restrictions Precautions Precautions: Fall Restrictions Weight Bearing Restrictions: Yes RLE Weight Bearing: Weight bearing as tolerated    Mobility  Bed Mobility Overal bed mobility: Needs Assistance Bed Mobility: Supine to Sit     Supine to sit: Mod assist;+2 for physical assistance     General bed mobility comments: assist to bring legs out to edge of bed with cues and assist for right; then pt pulled up on PT/tech to sit up, able to scoot self out to edge of bed  Transfers Overall transfer level: Needs assistance Equipment used: Rolling walker (2 wheeled) Transfers: Sit to/from [employer] Sit to Stand: Mod assist;+2 physical assistance Stand pivot transfers: Mod assist;+2 safety/equipment       General transfer comment: pulled up on walker; assist to get into standing. able to pivot with walker and assist due to painful with weight on right and help to turn walker  Ambulation/Gait                 Stairs            Wheelchair Mobility    Modified Rankin (Stroke Patients Only)       Balance                                     Cognition Arousal/Alertness: Awake/alert Behavior During Therapy: WFL for tasks assessed/performed Overall Cognitive Status: No family/caregiver present to determine baseline cognitive functioning (states at [facility] now (and for rehab prior to coming here,) knows year, states it is May and doesn't know day)                      Exercises Total Joint Exercises Ankle Circles/Pumps: AAROM;Both;10 reps;Supine Heel Slides: AAROM;Right;Supine;5 reps    General Comments        Pertinent Vitals/Pain Faces Pain Scale: Hurts whole lot Pain Location: pt indicated pain is in abdomen; but grabs right hip when repositioning Pain Intervention(s): Monitored during session;Repositioned    Home Living                      Prior Function            PT Goals (current goals can now be found in the care plan section) Progress towards PT goals: Progressing toward goals    Frequency  Min 3X/week    PT Plan Current plan remains appropriate    Co-evaluation             End of Session Equipment Utilized During Treatment: Gait  belt Activity Tolerance: Patient limited by pain Patient left: in chair;with call bell/phone within reach     Time: 1033-1058 PT Time Calculation (min) (ACUTE ONLY): 25 min  Charges:  $Therapeutic Exercise: 8-22 mins $Therapeutic Activity: 8-22 mins                    G Codes:      WYNN,CYNDI 03/27/2015, 11:31 AM  Sheran Lawlessyndi Wynn, PT [phone] 03/27/2015

## 2015-03-27 NOTE — Progress Notes (Signed)
Hypoglycemic Event  CBG: 60  Treatment: 15 GM carbohydrate snack  Symptoms: None  Follow-up CBG: Time:0830 CBG Result: 85 Possible Reasons for Event: Inadequate meal intake  Comments/MD notified: hypoglycemic protocol initiated    Larayne Baxley P  Remember to initiate Hypoglycemia Order Set & complete

## 2015-03-27 NOTE — Assessment & Plan Note (Signed)
Ortho consulted and patient is s/p: CLOSED REDUCTION INTERNAL FIXATION (Right) - treat supportively [facility]

## 2015-03-27 NOTE — Discharge Planning (Signed)
[facility]. Patient updated at bedside. CSW left message with patient's sister, per patient request.  Facility: [facility] Report number: [phone] Transport: EMS ([facility])  Marcelline DeistEmily Kimber Fritts, [place] Cell: [phone]       Fax: [phone] Clinical Social Work: Orthopedics [phone]([id number]) and Surgical [phone]([id number])

## 2015-03-27 NOTE — Progress Notes (Signed)
[facility] to [facility], informed that nurse was on break. Number left with staff at facility to call when able.  . [facility]-emergent [facility] at this time. Discharged in stable condition. Voicemail left with sister regarding discharge.

## 2015-03-27 NOTE — Assessment & Plan Note (Signed)
Pt to continue Lantus at 30 units SQ daily; cont ACE and statin

## 2015-03-27 NOTE — Progress Notes (Signed)
MRN: [id number] Name: [name]  Sex: male Age: 69 y.o. DOB: 02-Aug-1946  PSC #: [name] Facility/Room:221B Level Of Care: SNF Provider: Merrilee Seashore D Emergency Contacts: Extended Emergency Contact Information Primary Emergency Contact: [name],[name] Address: [address]          [place], [place] Home Phone: [phone] Mobile Phone: [phone] Relation: Sister  Code Status:   Allergies: Penicillins  Chief Complaint  Patient presents with  . New Admit To SNF    HPI: Patient is 69 y.o. male who was at [facility] rehabing from a L hip fracture when he fell out of bed and fractured his R hip. Pt is readmitted to SNF after an ORIF of R hip.  Past Medical History  Diagnosis Date  . Hyperglycemia   . Hyponatremia   . DMII (diabetes mellitus, type 2)   . Depression   . IBS (irritable bowel syndrome)   . Chest pain   . Unspecified gastritis and gastroduodenitis without mention of hemorrhage   . Arthropathy, unspecified, site unspecified   . History of colon polyps   . Anemia, unspecified   . Anxiety   . GERD (gastroesophageal reflux disease)   . Hyperlipidemia   . Hypertension     Past Surgical History  Procedure Laterality Date  . Hernia repair  1964  . Intramedullary (im) nail intertrochanteric Left 01/07/2015    Procedure: INTRAMEDULLARY (IM) NAIL INTERTROCHANTRIC;  Surgeon: Kathryne Hitch, MD;  Location: [facility];  Service: Orthopedics;  Laterality: Left;  . Femur im nail Right 03/24/2015    Procedure: CLOSED REDUCTION INTERNAL FIXATION;  Surgeon: Kerrin Champagne, MD;  Location: [facility] OR;  Service: Orthopedics;  Laterality: Right;      Medication List    Notice    This visit is during an admission. Changes to the med list made in this visit will be reflected in the After Visit Summary of the admission.     Current Outpatient Prescriptions on File Prior to Visit  Medication Sig Dispense Refill  . aspirin EC 325 MG EC  tablet Take 1 tablet (325 mg total) by mouth daily with breakfast. 30 tablet 0  . bisacodyl (DULCOLAX) 10 MG suppository Place 10 mg rectally as needed for moderate constipation.    . Cholecalciferol (VITAMIN D3) 1000 UNITS CAPS Take 1,000 Units by mouth every morning.     Marland Kitchen doxycycline (VIBRA-TABS) 100 MG tablet Take 100 mg by mouth 2 (two) times daily.    . ferrous sulfate 325 (65 FE) MG tablet Take 325 mg by mouth daily with breakfast.    . FLUoxetine (PROZAC) 20 MG capsule Take 60 mg by mouth daily.    Marland Kitchen glucose blood test strip 1 each by Other route 4 (four) times daily -  before meals and at bedtime. Use as instructed    . HYDROcodone-acetaminophen (NORCO/VICODIN) 5-325 MG per tablet Take 1-2 tablets by mouth every 6 (six) hours as needed for moderate pain. 30 tablet 0  . insulin aspart (NOVOLOG) 100 UNIT/ML injection Inject 0-9 Units into the skin 3 (three) times daily with meals. 10 mL 11  . insulin glargine (LANTUS) 100 UNIT/ML injection Inject 0.3 mLs (30 Units total) into the skin 2 (two) times daily. 10 mL 11  . lamoTRIgine (LAMICTAL) 25 MG tablet Take 50 mg by mouth 2 (two) times daily.    Marland Kitchen levothyroxine (SYNTHROID, LEVOTHROID) 25 MCG tablet Take 25 mcg by mouth daily.    . magnesium hydroxide (MILK OF  MAGNESIA) 400 MG/5ML suspension Take 30 mLs by mouth daily as needed for mild constipation.    . meclizine (ANTIVERT) 25 MG tablet Take 25 mg by mouth daily.    . Melatonin 10 MG TABS Take 10 mg by mouth at bedtime.    . methocarbamol (ROBAXIN) 500 MG tablet Take 1 tablet (500 mg total) by mouth every 6 (six) hours as needed for muscle spasms. 30 tablet 0  . omeprazole (PRILOSEC) 20 MG capsule Take 20 mg by mouth every morning.     . phenylephrine-shark liver oil-mineral oil-petrolatum (PREPARATION H) 0.25-3-14-71.9 % rectal ointment Place 1 application rectally 2 (two) times daily as needed for hemorrhoids.    . polyethylene glycol (MIRALAX / GLYCOLAX) packet Take 17 g by mouth daily  as needed for mild constipation.     . simvastatin (ZOCOR) 40 MG tablet Take 40 mg by mouth at bedtime.     . Sodium Phosphates (RA SALINE ENEMA RE) Place 1 each rectally as needed (for constipation).    . tamsulosin (FLOMAX) 0.4 MG CAPS capsule Take 0.4 mg by mouth daily.     . traZODone (DESYREL) 50 MG tablet Take 75 mg by mouth at bedtime.     Marland Kitchen UNABLE TO FIND Take 1 each by mouth 2 (two) times daily. Med Name: magic cup    . Wheat Dextrin (BENEFIBER) POWD Take 2 mLs by mouth every morning. Mix in 4 oz of water and drink    . [DISCONTINUED] zolpidem (AMBIEN) 5 MG tablet Take 5 mg by mouth at bedtime as needed.     No current facility-administered medications on file prior to visit.     No orders of the defined types were placed in this encounter.    Immunization History  Administered Date(s) Administered  . Influenza-Unspecified 09/13/2011  . PPD Test 01/07/2014  . Pneumococcal-Unspecified 12/13/2009  . Tdap 12/13/2009    History  Substance Use Topics  . Smoking status: Former Smoker -- 1.00 packs/day    Types: Cigarettes  . Smokeless tobacco: Not on file  . Alcohol Use: No    Family history is noncontributory    Review of Systems  DATA OBTAINED: from patient, nurse GENERAL:  no fevers, fatigue, appetite changes SKIN: No itching, rash or wounds EYES: No eye pain, redness, discharge EARS: No earache, tinnitus, change in hearing NOSE: No congestion, drainage or bleeding  MOUTH/THROAT: No mouth or tooth pain, No sore throat RESPIRATORY: No cough, wheezing, SOB CARDIAC: No chest pain, palpitations, lower extremity edema  GI: No abdominal pain, No N/V/D or constipation, No heartburn or reflux  GU: No dysuria, frequency or urgency, or incontinence  MUSCULOSKELETAL: No unrelieved bone/joint pain NEUROLOGIC: No headache, dizziness or focal weakness PSYCHIATRIC: No overt anxiety or sadness, No behavior issue.   Filed Vitals:   03/30/15 2126  BP: 178/70  Pulse: 88   Temp: 98 F (36.7 C)  Resp: 23    Physical Exam  GENERAL APPEARANCE: Alert, conversant, WM, No acute distress, very sweet, nice person  SKIN: No diaphoresis rash;incision looks fine HEAD: Normocephalic, atraumatic  EYES: Conjunctiva/lids clear. Pupils round, reactive. EOMs intact.  EARS: External exam WNL, canals clear. Hearing grossly normal.  NOSE: No deformity or discharge.  MOUTH/THROAT: Lips w/o lesions  RESPIRATORY: Breathing is even, unlabored. Lung sounds are clear   CARDIOVASCULAR: Heart RRR no murmurs, rubs or gallops. No peripheral edema.   GASTROINTESTINAL: Abdomen is soft, non-tender, not distended w/ normal bowel sounds. GENITOURINARY: Bladder non tender, not distended  MUSCULOSKELETAL: No abnormal joints or musculature NEUROLOGIC:  Cranial nerves 2-12 grossly intact  PSYCHIATRIC: Mood and affect appropriate to situation, very pleasant MR, no behavioral issues  Patient Active Problem List   Diagnosis Date Noted  . Closed right hip fracture 03/24/2015  . Murmur, cardiac 03/24/2015  . Abnormal rhythmic movement of tongue 03/24/2015  . Liver cirrhosis 03/24/2015  . Closed intertrochanteric fracture of right hip 03/24/2015    Class: Acute  . Urinary retention 01/15/2015  . CKD (chronic kidney disease) stage 3, GFR 30-59 ml/min 01/15/2015  . Closed left hip fracture 01/07/2015  . Hip fracture, left 01/07/2015  . Hip fracture 01/07/2015  . Hypothyroidism 01/07/2015  . Nausea vomiting and diarrhea   . Generalized weakness 07/20/2013  . Other pancytopenia 02/23/2013  . Internal and external bleeding hemorrhoids 11/29/2012  . Pruritus ani 11/29/2012  . DM (diabetes mellitus), type 2 with complications 11/13/2012  . Type 1 diabetes mellitus with neurological manifestations, uncontrolled 01/24/2012  . Depression 01/24/2012  . Psoriasis 01/24/2012  . Macrocytic anemia   . Anxiety   . GERD (gastroesophageal reflux disease)   . Hyperlipidemia   . Hypertension      CBC    Component Value Date/Time   WBC 4.0 03/26/2015 0534   WBC 3.1* 01/23/2015 1313   RBC 2.79* 03/26/2015 0534   RBC 3.32* 01/23/2015 1313   RBC 4.13* 09/27/2009 0545   HGB 8.3* 03/26/2015 0534   HGB 9.4* 01/23/2015 1313   HCT 25.2* 03/26/2015 0534   HCT 29.9* 01/23/2015 1313   PLT 84* 03/26/2015 0534   PLT 148 01/23/2015 1313   MCV 90.3 03/26/2015 0534   MCV 90.0 01/23/2015 1313   LYMPHSABS 0.5* 03/26/2015 0534   LYMPHSABS 0.4* 01/23/2015 1313   MONOABS 0.5 03/26/2015 0534   MONOABS 0.2 01/23/2015 1313   EOSABS 0.1 03/26/2015 0534   EOSABS 0.1 01/23/2015 1313   BASOSABS 0.0 03/26/2015 0534   BASOSABS 0.0 01/23/2015 1313    CMP     Component Value Date/Time   NA 136 03/25/2015 0546   NA 138 01/23/2015 1313   K 4.5 03/25/2015 0546   K 4.6 01/23/2015 1313   CL 109 03/25/2015 0546   CO2 20 03/25/2015 0546   CO2 24 01/23/2015 1313   GLUCOSE 182* 03/25/2015 0546   GLUCOSE 224* 01/23/2015 1313   BUN 30* 03/25/2015 0546   BUN 31.2* 01/23/2015 1313   CREATININE 0.99 03/25/2015 0546   CREATININE 1.2 01/23/2015 1313   CALCIUM 7.9* 03/25/2015 0546   CALCIUM 9.1 01/23/2015 1313   PROT 7.7 01/23/2015 1313   PROT 8.8* 01/04/2014 1631   ALBUMIN 2.7* 01/23/2015 1313   ALBUMIN 3.3* 01/04/2014 1631   AST 57* 01/23/2015 1313   AST 52* 01/04/2014 1631   ALT 26 01/23/2015 1313   ALT 39 01/04/2014 1631   ALKPHOS 144 01/23/2015 1313   ALKPHOS 64 01/04/2014 1631   BILITOT 0.90 01/23/2015 1313   BILITOT 0.4 01/04/2014 1631   GFRNONAA 83* 03/25/2015 0546   GFRAA >90 03/25/2015 0546    Assessment and Plan  Closed intertrochanteric fracture of right hip Ortho consulted and patient is s/p: CLOSED REDUCTION INTERNAL FIXATION (Right) - treat supportively - Physical therapy at SNF   Hypertension due to soft BP's will hold ARB on discharge; continue lisinopril 5 mg   DM (diabetes mellitus), type 2 with complications Pt to continue Lantus at 30 units SQ daily; cont ACE  and statin     Margit HanksALEXANDER, Amjad Fikes D, MD

## 2015-03-27 NOTE — Discharge Summary (Signed)
Physician Discharge Summary  [name] UJW:[id number] DOB: May 04, 1946 DOA: 03/24/2015  PCP: Margit Hanks, MD  Admit date: 03/24/2015 Discharge date: 03/27/2015  Time spent: >35 minutes  Recommendations for Outpatient Follow-up:  Please be sure to follow up with your orthopaedic surgeon in 2 weeks Held ARB due to soft blood pressures  Discharge Diagnoses:  Principal Problem:   Closed intertrochanteric fracture of right hip Active Problems:   Anxiety   Hyperlipidemia   Hypertension   DM (diabetes mellitus), type 2 with complications   Internal and external bleeding hemorrhoids   Other pancytopenia   Hypothyroidism   CKD (chronic kidney disease) stage 3, GFR 30-59 ml/min   Closed right hip fracture   Murmur, cardiac   Abnormal rhythmic movement of tongue   Liver cirrhosis   Discharge Condition: stable  Diet recommendation: Carb modified diet  Filed Weights   03/24/15 0553  Weight: 75.297 kg (166 lb)    History of present illness:  From original HPI: 69 year old gentleman who came from his SNF after he fell from his bed and landed on his right hip, x-ray in the ED shows that has a hip fracture. Orthopedic surgery was consulted at the EDP and [facility] was asked for admission for hip fracture.   Hospital Course:  Hip fx - Ortho consulted and patient is s/p: CLOSED REDUCTION INTERNAL FIXATION (Right) - treat supportively - Physical therapy at SNF  HTN - due to soft BP's will hold ARB on discharge  DM - Pt to continue Lantus at 30 units SQ daily - diabetic diet  Procedures:  See above  Consultations:  Ortho: Dr. Otelia Sergeant  Discharge Exam: Filed Vitals:   03/27/15 0600  BP: 97/48  Pulse: 69  Temp: 98 F (36.7 C)  Resp: 16    General: pt in nad, alert and awake Cardiovascular: no cyanosis, pink extremities Respiratory: cta bl, no wheezes  Discharge Instructions   Discharge Instructions    Call MD for:  redness, tenderness, or signs of infection  (pain, swelling, redness, odor or green/yellow discharge around incision site)    Complete by:  As directed      Call MD for:  severe uncontrolled pain    Complete by:  As directed      Call MD for:  temperature >100.4    Complete by:  As directed      Diet - low sodium heart healthy    Complete by:  As directed      Discharge instructions    Complete by:  As directed   Patient to continue Physical therapy while at facility     Increase activity slowly    Complete by:  As directed      Weight bearing as tolerated    Complete by:  As directed           Current Discharge Medication List    CONTINUE these medications which have CHANGED   Details  HYDROcodone-acetaminophen (NORCO/VICODIN) 5-325 MG per tablet Take 1-2 tablets by mouth every 6 (six) hours as needed for moderate pain. Qty: 30 tablet, Refills: 0    insulin glargine (LANTUS) 100 UNIT/ML injection Inject 0.3 mLs (30 Units total) into the skin 2 (two) times daily. Qty: 10 mL, Refills: 11      CONTINUE these medications which have NOT CHANGED   Details  aspirin EC 325 MG EC tablet Take 1 tablet (325 mg total) by mouth daily with breakfast. Qty: 30 tablet, Refills: 0    bisacodyl (  DULCOLAX) 10 MG suppository Place 10 mg rectally as needed for moderate constipation.    Cholecalciferol (VITAMIN D3) 1000 UNITS CAPS Take 1,000 Units by mouth every morning.     doxycycline (VIBRA-TABS) 100 MG tablet Take 100 mg by mouth 2 (two) times daily.    ferrous sulfate 325 (65 FE) MG tablet Take 325 mg by mouth daily with breakfast.    FLUoxetine (PROZAC) 20 MG capsule Take 60 mg by mouth daily.    insulin aspart (NOVOLOG) 100 UNIT/ML injection Inject 0-9 Units into the skin 3 (three) times daily with meals. Qty: 10 mL, Refills: 11    lamoTRIgine (LAMICTAL) 25 MG tablet Take 50 mg by mouth 2 (two) times daily.    levothyroxine (SYNTHROID, LEVOTHROID) 25 MCG tablet Take 25 mcg by mouth daily.    magnesium hydroxide (MILK OF  MAGNESIA) 400 MG/5ML suspension Take 30 mLs by mouth daily as needed for mild constipation.    meclizine (ANTIVERT) 25 MG tablet Take 25 mg by mouth daily.    Melatonin 10 MG TABS Take 10 mg by mouth at bedtime.    methocarbamol (ROBAXIN) 500 MG tablet Take 1 tablet (500 mg total) by mouth every 6 (six) hours as needed for muscle spasms. Qty: 30 tablet, Refills: 0    omeprazole (PRILOSEC) 20 MG capsule Take 20 mg by mouth every morning.     phenylephrine-shark liver oil-mineral oil-petrolatum (PREPARATION H) 0.25-3-14-71.9 % rectal ointment Place 1 application rectally 2 (two) times daily as needed for hemorrhoids.    polyethylene glycol (MIRALAX / GLYCOLAX) packet Take 17 g by mouth daily as needed for mild constipation.     simvastatin (ZOCOR) 40 MG tablet Take 40 mg by mouth at bedtime.     Sodium Phosphates (RA SALINE ENEMA RE) Place 1 each rectally as needed (for constipation).    tamsulosin (FLOMAX) 0.4 MG CAPS capsule Take 0.4 mg by mouth daily.    Associated Diagnoses: Other pancytopenia    traZODone (DESYREL) 50 MG tablet Take 75 mg by mouth at bedtime.     UNABLE TO FIND Take 1 each by mouth 2 (two) times daily. Med Name: magic cup    Wheat Dextrin (BENEFIBER) POWD Take 2 mLs by mouth every morning. Mix in 4 oz of water and drink    glucose blood test strip 1 each by Other route 4 (four) times daily -  before meals and at bedtime. Use as instructed      STOP taking these medications     acetaminophen (TYLENOL) 500 MG tablet      lisinopril (PRINIVIL,ZESTRIL) 5 MG tablet      losartan (COZAAR) 25 MG tablet        Allergies  Allergen Reactions  . Penicillins Hives   Follow-up Information    Follow up with NITKA,JAMES E, MD In 2 weeks.   Specialty:  Orthopedic Surgery   Contact information:   [address] [place] [address] [phone]        The results of significant diagnostics from this hospitalization (including imaging, microbiology,  ancillary and laboratory) are listed below for reference.    Significant Diagnostic Studies: Dg Chest 1 View  03/24/2015   CLINICAL DATA:  Status post fall 03/23/2015.  Right hip fracture.  EXAM: CHEST  1 VIEW  COMPARISON:  Single view of the chest 01/06/2015. PA and lateral chest 01/04/2014.  FINDINGS: Heart size is upper normal. The lungs are clear. No pneumothorax or pleural effusion.  IMPRESSION: No acute disease.  Electronically Signed   By: Drusilla Kannerhomas  Dalessio M.D.   On: 03/24/2015 07:32   Dg Hip Operative Unilat With Pelvis Right  03/24/2015   CLINICAL DATA:  Right hip fixation  EXAM: OPERATIVE right HIP (WITH PELVIS IF PERFORMED)  VIEWS  TECHNIQUE: Fluoroscopic spot image(s) were submitted for interpretation post-operatively.  FLUOROSCOPY TIME:  Fluoroscopy time: 1 min 5 seconds  : COMPARISON:  Same date.  FINDINGS: Evidence of dynamic right femoral nail fixation of previously seen intertrochanteric hip fracture. Fracture fragments are in near anatomic alignment. No evidence for hardware failure.  IMPRESSION: Expected intraoperative appearance after right femoral ORIF.   Electronically Signed   By: Christiana PellantGretchen  Green M.D.   On: 03/24/2015 21:43   Dg Hip Unilat  With Pelvis 2-3 Views Right  03/24/2015   CLINICAL DATA:  Fall, right hip pain  EXAM: RIGHT HIP (WITH PELVIS) 2-3 VIEWS  COMPARISON:  Radiograph 01/07/2015  FINDINGS: Acute intertrochanteric fracture of the proximal right femur. This avulsion of the lesser trochanter. Mild varus angulation. No dislocation. Internal fixation of left hip fracture noted  IMPRESSION: Acute right intertrochanteric femur fracture.   Electronically Signed   By: Genevive BiStewart  Edmunds M.D.   On: 03/24/2015 07:34    Microbiology: Recent Results (from the past 240 hour(s))  MRSA PCR Screening     Status: None   Collection Time: 03/24/15  1:30 PM  Result Value Ref Range Status   MRSA by PCR NEGATIVE NEGATIVE Final    Comment:        The GeneXpert MRSA Assay  (FDA approved for NASAL specimens only), is one component of a comprehensive MRSA colonization surveillance program. It is not intended to diagnose MRSA infection nor to guide or monitor treatment for MRSA infections.      Labs: Basic Metabolic Panel:  Recent Labs Lab 03/24/15 0640 03/24/15 1719 03/25/15 0546  NA 138 142 136  K 4.7 5.0 4.5  CL 110  --  109  CO2 25  --  20  GLUCOSE 134* 119* 182*  BUN 30*  --  30*  CREATININE 1.23  --  0.99  CALCIUM 8.5  --  7.9*   Liver Function Tests: No results for input(s): AST, ALT, ALKPHOS, BILITOT, PROT, ALBUMIN in the last 168 hours. No results for input(s): LIPASE, AMYLASE in the last 168 hours. No results for input(s): AMMONIA in the last 168 hours. CBC:  Recent Labs Lab 03/24/15 0640 03/24/15 1719 03/24/15 2144 03/25/15 0546 03/26/15 0534  WBC 2.3*  --  7.3 8.2 4.0  NEUTROABS 1.8  --   --   --  2.9  HGB 7.3* 8.2* 10.9* 10.2* 8.3*  HCT 23.2* 24.0* 33.8* 30.7* 25.2*  MCV 92.1  --  89.9 88.5 90.3  PLT 107*  --  116* 135* 84*   Cardiac Enzymes: No results for input(s): CKTOTAL, CKMB, CKMBINDEX, TROPONINI in the last 168 hours. BNP: BNP (last 3 results) No results for input(s): BNP in the last 8760 hours.  ProBNP (last 3 results) No results for input(s): PROBNP in the last 8760 hours.  CBG:  Recent Labs Lab 03/26/15 2210 03/26/15 2357 03/27/15 0404 03/27/15 0815 03/27/15 0855  GLUCAP 147* 120* 77 60* 85       Signed:  Penny PiaVEGA, Lezette Kitts  [facility] 03/27/2015, 11:02 AM

## 2015-03-30 ENCOUNTER — Encounter: Payer: Self-pay | Admitting: Internal Medicine

## 2015-04-10 ENCOUNTER — Ambulatory Visit (HOSPITAL_COMMUNITY)
Admission: RE | Admit: 2015-04-10 | Discharge: 2015-04-10 | Disposition: A | Discharge status: Home / Self Care | Payer: PRIVATE HEALTH INSURANCE | Source: Ambulatory Visit | Attending: Orthopaedic Surgery | Admitting: Orthopaedic Surgery

## 2015-04-10 ENCOUNTER — Other Ambulatory Visit (HOSPITAL_COMMUNITY): Payer: Self-pay | Admitting: Orthopaedic Surgery

## 2015-04-10 DIAGNOSIS — M7989 Other specified soft tissue disorders: Secondary | ICD-10-CM

## 2015-04-10 DIAGNOSIS — M25561 Pain in right knee: Secondary | ICD-10-CM | POA: Diagnosis not present

## 2015-04-10 NOTE — Progress Notes (Signed)
*  Preliminary Results* Right lower extremity venous duplex completed. Right lower extremity is negative for deep vein thrombosis. There is no evidence of right Baker's cyst.  04/10/2015 6:11 PM  Gertie FeyMichelle Markeesha Char, RVT, RDCS, RDMS

## 2015-04-14 ENCOUNTER — Other Ambulatory Visit: Payer: Self-pay | Admitting: *Deleted

## 2015-04-14 MED ORDER — HYDROCODONE-ACETAMINOPHEN 5-325 MG PO TABS: 1.0000 | ORAL_TABLET | Freq: Four times a day (QID) | ORAL | Status: DC | PRN

## 2015-04-14 NOTE — Telephone Encounter (Signed)
[facility] 

## 2015-04-17 ENCOUNTER — Non-Acute Institutional Stay (SKILLED_NURSING_FACILITY): Payer: Medicare Other | Admitting: Internal Medicine

## 2015-04-17 ENCOUNTER — Encounter: Payer: Self-pay | Admitting: Internal Medicine

## 2015-04-17 DIAGNOSIS — K7460 Unspecified cirrhosis of liver: Secondary | ICD-10-CM

## 2015-04-17 NOTE — Progress Notes (Signed)
MRN: [id number] Name: [name]  Sex: male Age: 69 y.o. DOB: 01-Jul-1946  [email or web address] Facility/Room:220 Level Of Care: SNF Provider: Merrilee SeashoreALEXANDER, Meena Barrantes D Emergency Contacts: Extended Emergency Contact Information Primary Emergency Contact: [name],[name] Address: [address]          [place], [place] Home Phone: [phone] Mobile Phone: [phone] Relation: Sister  Code Status: DNR  Allergies: Penicillins  Chief Complaint  Patient presents with  . Acute Visit    HPI: Patient is 69 y.o. male who is at [facility] fractures who went to see his GI doctor today.   Past Medical History  Diagnosis Date  . Hyperglycemia   . Hyponatremia   . DMII (diabetes mellitus, type 2)   . Depression   . IBS (irritable bowel syndrome)   . Chest pain   . Unspecified gastritis and gastroduodenitis without mention of hemorrhage   . Arthropathy, unspecified, site unspecified   . History of colon polyps   . Anemia, unspecified   . Anxiety   . GERD (gastroesophageal reflux disease)   . Hyperlipidemia   . Hypertension     Past Surgical History  Procedure Laterality Date  . Hernia repair  1964  . Intramedullary (im) nail intertrochanteric Left 01/07/2015    Procedure: INTRAMEDULLARY (IM) NAIL INTERTROCHANTRIC;  Surgeon: Kathryne Hitchhristopher Y Blackman, MD;  Location: [facility];  Service: Orthopedics;  Laterality: Left;  . Femur im nail Right 03/24/2015    Procedure: CLOSED REDUCTION INTERNAL FIXATION;  Surgeon: Kerrin ChampagneJames E Nitka, MD;  Location: [facility] OR;  Service: Orthopedics;  Laterality: Right;      Medication List       This list is accurate as of: 04/17/15  9:06 PM.  Always use your most recent med list.               aspirin 325 MG EC tablet  Take 1 tablet (325 mg total) by mouth daily with breakfast.     BENEFIBER Powd  Take 2 mLs by mouth every morning. Mix in 4 oz of water and drink     bisacodyl 10 MG suppository  Commonly known as:   DULCOLAX  Place 10 mg rectally as needed for moderate constipation.     doxycycline 100 MG tablet  Commonly known as:  VIBRA-TABS  Take 100 mg by mouth 2 (two) times daily.     ferrous sulfate 325 (65 FE) MG tablet  Take 325 mg by mouth daily with breakfast.     FLUoxetine 20 MG capsule  Commonly known as:  PROZAC  Take 60 mg by mouth daily.     glucose blood test strip  1 each by Other route 4 (four) times daily -  before meals and at bedtime. Use as instructed     HYDROcodone-acetaminophen 5-325 MG per tablet  Commonly known as:  NORCO/VICODIN  Take 1-2 tablets by mouth every 6 (six) hours as needed for moderate pain.     insulin aspart 100 UNIT/ML injection  Commonly known as:  novoLOG  Inject 0-9 Units into the skin 3 (three) times daily with meals.     insulin glargine 100 UNIT/ML injection  Commonly known as:  LANTUS  Inject 0.3 mLs (30 Units total) into the skin 2 (two) times daily.     lamoTRIgine 25 MG tablet  Commonly known as:  LAMICTAL  Take 50 mg by mouth 2 (two) times daily.     levothyroxine 25 MCG tablet  Commonly known  as:  SYNTHROID, LEVOTHROID  Take 25 mcg by mouth daily.     magnesium hydroxide 400 MG/5ML suspension  Commonly known as:  MILK OF MAGNESIA  Take 30 mLs by mouth daily as needed for mild constipation.     meclizine 25 MG tablet  Commonly known as:  ANTIVERT  Take 25 mg by mouth daily.     Melatonin 10 MG Tabs  Take 10 mg by mouth at bedtime.     methocarbamol 500 MG tablet  Commonly known as:  ROBAXIN  Take 1 tablet (500 mg total) by mouth every 6 (six) hours as needed for muscle spasms.     omeprazole 20 MG capsule  Commonly known as:  PRILOSEC  Take 20 mg by mouth every morning.     phenylephrine-shark liver oil-mineral oil-petrolatum 0.25-3-14-71.9 % rectal ointment  Commonly known as:  PREPARATION H  Place 1 application rectally 2 (two) times daily as needed for hemorrhoids.     polyethylene glycol packet  Commonly known  as:  MIRALAX / GLYCOLAX  Take 17 g by mouth daily as needed for mild constipation.     RA SALINE ENEMA RE  Place 1 each rectally as needed (for constipation).     simvastatin 40 MG tablet  Commonly known as:  ZOCOR  Take 40 mg by mouth at bedtime.     tamsulosin 0.4 MG Caps capsule  Commonly known as:  FLOMAX  Take 0.4 mg by mouth daily.     traZODone 50 MG tablet  Commonly known as:  DESYREL  Take 75 mg by mouth at bedtime.     UNABLE TO FIND  Take 1 each by mouth 2 (two) times daily. Med Name: magic cup     Vitamin D3 1000 UNITS Caps  Take 1,000 Units by mouth every morning.        No orders of the defined types were placed in this encounter.    Immunization History  Administered Date(s) Administered  . Influenza-Unspecified 09/13/2011  . PPD Test 01/07/2014  . Pneumococcal-Unspecified 12/13/2009  . Tdap 12/13/2009    History  Substance Use Topics  . Smoking status: Former Smoker -- 1.00 packs/day    Types: Cigarettes  . Smokeless tobacco: Not on file  . Alcohol Use: No    Review of Systems  DATA OBTAINED: from patient, nurse GENERAL:  no fevers, fatigue, appetite changes SKIN: No itching, rash HEENT: No complaint RESPIRATORY: No cough, wheezing, SOB CARDIAC: No chest pain, palpitations, lower extremity edema  GI: No abdominal pain, No N/V/D or constipation, No heartburn or reflux  GU: No dysuria, frequency or urgency, or incontinence  MUSCULOSKELETAL: No unrelieved bone/joint pain NEUROLOGIC: No headache, dizziness  PSYCHIATRIC: No overt anxiety or sadness  Filed Vitals:   04/17/15 2011  BP: 126/61  Pulse: 78  Temp: 97 F (36.1 C)  Resp: 20    Physical Exam  GENERAL APPEARANCE: Alert, conversant, No acute distress, very pleasant WM  SKIN: No diaphoresis rash, or wounds HEENT: Unremarkable RESPIRATORY: Breathing is even, unlabored. Lung sounds are clear   CARDIOVASCULAR: Heart RRR no murmurs, rubs or gallops. + peripheral edema   GASTROINTESTINAL: Abdomen is soft, non-tender, not distended w/ normal bowel sounds.  GENITOURINARY: Bladder non tender, not distended  MUSCULOSKELETAL: No abnormal joints or musculature NEUROLOGIC: Cranial nerves 2-12 grossly intact PSYCHIATRIC: Mood and affect appropriate to situation, no behavioral issues  Patient Active Problem List   Diagnosis Date Noted  . Closed right hip fracture 03/24/2015  . Murmur,  cardiac 03/24/2015  . Abnormal rhythmic movement of tongue 03/24/2015  . Liver cirrhosis 03/24/2015  . Closed intertrochanteric fracture of right hip 03/24/2015    Class: Acute  . Urinary retention 01/15/2015  . CKD (chronic kidney disease) stage 3, GFR 30-59 ml/min 01/15/2015  . Closed left hip fracture 01/07/2015  . Hip fracture, left 01/07/2015  . Hip fracture 01/07/2015  . Hypothyroidism 01/07/2015  . Nausea vomiting and diarrhea   . Generalized weakness 07/20/2013  . Other pancytopenia 02/23/2013  . Internal and external bleeding hemorrhoids 11/29/2012  . Pruritus ani 11/29/2012  . DM (diabetes mellitus), type 2 with complications 11/13/2012  . Type 1 diabetes mellitus with neurological manifestations, uncontrolled 01/24/2012  . Depression 01/24/2012  . Psoriasis 01/24/2012  . Macrocytic anemia   . Anxiety   . GERD (gastroesophageal reflux disease)   . Hyperlipidemia   . Hypertension     CBC    Component Value Date/Time   WBC 4.0 03/26/2015 0534   WBC 3.1* 01/23/2015 1313   RBC 2.79* 03/26/2015 0534   RBC 3.32* 01/23/2015 1313   RBC 4.13* 09/27/2009 0545   HGB 8.3* 03/26/2015 0534   HGB 9.4* 01/23/2015 1313   HCT 25.2* 03/26/2015 0534   HCT 29.9* 01/23/2015 1313   PLT 84* 03/26/2015 0534   PLT 148 01/23/2015 1313   MCV 90.3 03/26/2015 0534   MCV 90.0 01/23/2015 1313   LYMPHSABS 0.5* 03/26/2015 0534   LYMPHSABS 0.4* 01/23/2015 1313   MONOABS 0.5 03/26/2015 0534   MONOABS 0.2 01/23/2015 1313   EOSABS 0.1 03/26/2015 0534   EOSABS 0.1 01/23/2015 1313    BASOSABS 0.0 03/26/2015 0534   BASOSABS 0.0 01/23/2015 1313    CMP     Component Value Date/Time   NA 136 03/25/2015 0546   NA 138 01/23/2015 1313   K 4.5 03/25/2015 0546   K 4.6 01/23/2015 1313   CL 109 03/25/2015 0546   CO2 20 03/25/2015 0546   CO2 24 01/23/2015 1313   GLUCOSE 182* 03/25/2015 0546   GLUCOSE 224* 01/23/2015 1313   BUN 30* 03/25/2015 0546   BUN 31.2* 01/23/2015 1313   CREATININE 0.99 03/25/2015 0546   CREATININE 1.2 01/23/2015 1313   CALCIUM 7.9* 03/25/2015 0546   CALCIUM 9.1 01/23/2015 1313   PROT 7.7 01/23/2015 1313   PROT 8.8* 01/04/2014 1631   ALBUMIN 2.7* 01/23/2015 1313   ALBUMIN 3.3* 01/04/2014 1631   AST 57* 01/23/2015 1313   AST 52* 01/04/2014 1631   ALT 26 01/23/2015 1313   ALT 39 01/04/2014 1631   ALKPHOS 144 01/23/2015 1313   ALKPHOS 64 01/04/2014 1631   BILITOT 0.90 01/23/2015 1313   BILITOT 0.4 01/04/2014 1631   GFRNONAA 83* 03/25/2015 0546   GFRAA >90 03/25/2015 0546    Assessment and Plan  Liver cirrhosis Per Dr Matthias Hughs, GI, pt needs 1 abdominal US q 6 months to screen to screen for hepatocellular cancer - use code K 74.60                                               2  start lasix and spironolactone  - I have written for lasix 20 mg and spironolactone 25 mg , both daily, and will titrate up as needed,  for edema, BMP to follow                                               3  Stool hemocult X3 and f/u with him if +, CBC with iron sat q 3 months     Margit HanksALEXANDER, Shiva Karis D, MD

## 2015-04-17 NOTE — Assessment & Plan Note (Signed)
Per Dr Matthias HughsBuccini, GI, pt needs 1 abdominal US q 6 months to screen to screen for hepatocellular cancer - use code K 74.60                                               2  start lasix and spironolactone  - I have written for lasix 20 mg and spironolactone 25 mg , both daily, and will titrate up as needed,                                                   for edema, BMP to follow                                               3  Stool hemocult X3 and f/u with him if +, CBC with iron sat q 3 months

## 2015-04-19 ENCOUNTER — Inpatient Hospital Stay (HOSPITAL_COMMUNITY)
Admission: EM | Admit: 2015-04-19 | Discharge: 2015-04-22 | DRG: 378 | Disposition: A | Discharge status: Skilled Nursing Facility | Payer: Medicare Other | Attending: Internal Medicine | Admitting: Internal Medicine

## 2015-04-19 ENCOUNTER — Encounter (HOSPITAL_COMMUNITY): Payer: Self-pay | Admitting: Emergency Medicine

## 2015-04-19 DIAGNOSIS — K148 Other diseases of tongue: Secondary | ICD-10-CM

## 2015-04-19 DIAGNOSIS — K219 Gastro-esophageal reflux disease without esophagitis: Secondary | ICD-10-CM | POA: Diagnosis not present

## 2015-04-19 DIAGNOSIS — K7460 Unspecified cirrhosis of liver: Secondary | ICD-10-CM

## 2015-04-19 DIAGNOSIS — IMO0002 Reserved for concepts with insufficient information to code with codable children: Secondary | ICD-10-CM

## 2015-04-19 DIAGNOSIS — F17210 Nicotine dependence, cigarettes, uncomplicated: Secondary | ICD-10-CM | POA: Diagnosis not present

## 2015-04-19 DIAGNOSIS — E785 Hyperlipidemia, unspecified: Secondary | ICD-10-CM | POA: Diagnosis not present

## 2015-04-19 DIAGNOSIS — F32A Depression, unspecified: Secondary | ICD-10-CM

## 2015-04-19 DIAGNOSIS — F419 Anxiety disorder, unspecified: Secondary | ICD-10-CM

## 2015-04-19 DIAGNOSIS — R531 Weakness: Secondary | ICD-10-CM

## 2015-04-19 DIAGNOSIS — D62 Acute posthemorrhagic anemia: Secondary | ICD-10-CM

## 2015-04-19 DIAGNOSIS — K921 Melena: Secondary | ICD-10-CM | POA: Diagnosis not present

## 2015-04-19 DIAGNOSIS — R112 Nausea with vomiting, unspecified: Secondary | ICD-10-CM

## 2015-04-19 DIAGNOSIS — R188 Other ascites: Secondary | ICD-10-CM | POA: Diagnosis present

## 2015-04-19 DIAGNOSIS — K922 Gastrointestinal hemorrhage, unspecified: Secondary | ICD-10-CM | POA: Diagnosis present

## 2015-04-19 DIAGNOSIS — S72141S Displaced intertrochanteric fracture of right femur, sequela: Secondary | ICD-10-CM

## 2015-04-19 DIAGNOSIS — N183 Chronic kidney disease, stage 3 unspecified: Secondary | ICD-10-CM

## 2015-04-19 DIAGNOSIS — K2960 Other gastritis without bleeding: Secondary | ICD-10-CM | POA: Diagnosis not present

## 2015-04-19 DIAGNOSIS — K589 Irritable bowel syndrome without diarrhea: Secondary | ICD-10-CM | POA: Diagnosis not present

## 2015-04-19 DIAGNOSIS — S72002S Fracture of unspecified part of neck of left femur, sequela: Secondary | ICD-10-CM

## 2015-04-19 DIAGNOSIS — R339 Retention of urine, unspecified: Secondary | ICD-10-CM

## 2015-04-19 DIAGNOSIS — Z66 Do not resuscitate: Secondary | ICD-10-CM | POA: Diagnosis present

## 2015-04-19 DIAGNOSIS — E118 Type 2 diabetes mellitus with unspecified complications: Secondary | ICD-10-CM | POA: Diagnosis not present

## 2015-04-19 DIAGNOSIS — E039 Hypothyroidism, unspecified: Secondary | ICD-10-CM

## 2015-04-19 DIAGNOSIS — D539 Nutritional anemia, unspecified: Secondary | ICD-10-CM

## 2015-04-19 DIAGNOSIS — M129 Arthropathy, unspecified: Secondary | ICD-10-CM | POA: Diagnosis present

## 2015-04-19 DIAGNOSIS — Z87891 Personal history of nicotine dependence: Secondary | ICD-10-CM | POA: Diagnosis not present

## 2015-04-19 DIAGNOSIS — K648 Other hemorrhoids: Secondary | ICD-10-CM

## 2015-04-19 DIAGNOSIS — D61818 Other pancytopenia: Secondary | ICD-10-CM | POA: Diagnosis present

## 2015-04-19 DIAGNOSIS — K7581 Nonalcoholic steatohepatitis (NASH): Secondary | ICD-10-CM | POA: Diagnosis present

## 2015-04-19 DIAGNOSIS — R197 Diarrhea, unspecified: Secondary | ICD-10-CM

## 2015-04-19 DIAGNOSIS — E1065 Type 1 diabetes mellitus with hyperglycemia: Secondary | ICD-10-CM

## 2015-04-19 DIAGNOSIS — I10 Essential (primary) hypertension: Secondary | ICD-10-CM | POA: Diagnosis not present

## 2015-04-19 DIAGNOSIS — R195 Other fecal abnormalities: Secondary | ICD-10-CM | POA: Diagnosis present

## 2015-04-19 DIAGNOSIS — R161 Splenomegaly, not elsewhere classified: Secondary | ICD-10-CM | POA: Diagnosis not present

## 2015-04-19 DIAGNOSIS — E11649 Type 2 diabetes mellitus with hypoglycemia without coma: Secondary | ICD-10-CM | POA: Diagnosis present

## 2015-04-19 DIAGNOSIS — Z880 Allergy status to penicillin: Secondary | ICD-10-CM

## 2015-04-19 DIAGNOSIS — Z7982 Long term (current) use of aspirin: Secondary | ICD-10-CM | POA: Diagnosis not present

## 2015-04-19 DIAGNOSIS — R011 Cardiac murmur, unspecified: Secondary | ICD-10-CM

## 2015-04-19 DIAGNOSIS — Z86010 Personal history of colonic polyps: Secondary | ICD-10-CM

## 2015-04-19 DIAGNOSIS — L290 Pruritus ani: Secondary | ICD-10-CM

## 2015-04-19 DIAGNOSIS — E1049 Type 1 diabetes mellitus with other diabetic neurological complication: Secondary | ICD-10-CM

## 2015-04-19 DIAGNOSIS — F329 Major depressive disorder, single episode, unspecified: Secondary | ICD-10-CM | POA: Diagnosis present

## 2015-04-19 DIAGNOSIS — L409 Psoriasis, unspecified: Secondary | ICD-10-CM

## 2015-04-19 DIAGNOSIS — K644 Residual hemorrhoidal skin tags: Secondary | ICD-10-CM

## 2015-04-19 LAB — COMPREHENSIVE METABOLIC PANEL
ALT: 30 U/L (ref 17–63)
ANION GAP: 8 (ref 5–15)
AST: 71 U/L — AB (ref 15–41)
Albumin: 1.8 g/dL — ABNORMAL LOW (ref 3.5–5.0)
Alkaline Phosphatase: 297 U/L — ABNORMAL HIGH (ref 38–126)
BILIRUBIN TOTAL: 0.9 mg/dL (ref 0.3–1.2)
BUN: 27 mg/dL — AB (ref 6–20)
CO2: 23 mmol/L (ref 22–32)
CREATININE: 1.07 mg/dL (ref 0.61–1.24)
Calcium: 8.2 mg/dL — ABNORMAL LOW (ref 8.9–10.3)
Chloride: 108 mmol/L (ref 101–111)
Glucose, Bld: 69 mg/dL — ABNORMAL LOW (ref 70–99)
Potassium: 4.2 mmol/L (ref 3.5–5.1)
Sodium: 139 mmol/L (ref 135–145)
Total Protein: 6.9 g/dL (ref 6.5–8.1)

## 2015-04-19 LAB — GLUCOSE, CAPILLARY
GLUCOSE-CAPILLARY: 45 mg/dL — AB (ref 70–99)
Glucose-Capillary: 74 mg/dL (ref 70–99)
Glucose-Capillary: 107 mg/dL — ABNORMAL HIGH (ref 70–99)
Glucose-Capillary: 64 mg/dL — ABNORMAL LOW (ref 70–99)
Glucose-Capillary: 136 mg/dL — ABNORMAL HIGH (ref 70–99)
Glucose-Capillary: 87 mg/dL (ref 70–99)

## 2015-04-19 LAB — CBC
HCT: 20.6 % — ABNORMAL LOW (ref 39.0–52.0)
Hemoglobin: 6.5 g/dL — CL (ref 13.0–17.0)
MCH: 28.5 pg (ref 26.0–34.0)
MCHC: 31.6 g/dL (ref 30.0–36.0)
MCV: 90.4 fL (ref 78.0–100.0)
PLATELETS: 141 10*3/uL — AB (ref 150–400)
RBC: 2.28 MIL/uL — AB (ref 4.22–5.81)
RDW: 16.6 % — AB (ref 11.5–15.5)
WBC: 2.1 10*3/uL — ABNORMAL LOW (ref 4.0–10.5)

## 2015-04-19 LAB — MRSA PCR SCREENING: MRSA by PCR: NEGATIVE

## 2015-04-19 LAB — HEMOGLOBIN AND HEMATOCRIT, BLOOD
HCT: 19.6 % — ABNORMAL LOW (ref 39.0–52.0)
Hemoglobin: 6 g/dL — CL (ref 13.0–17.0)

## 2015-04-19 LAB — SAMPLE TO BLOOD BANK

## 2015-04-19 LAB — PROTIME-INR
INR: 1.5 — ABNORMAL HIGH (ref 0.00–1.49)
PROTHROMBIN TIME: 18.3 s — AB (ref 11.6–15.2)

## 2015-04-19 LAB — PREPARE RBC (CROSSMATCH)

## 2015-04-19 LAB — CBG MONITORING, ED: Glucose-Capillary: 53 mg/dL — ABNORMAL LOW (ref 70–99)

## 2015-04-19 LAB — POC OCCULT BLOOD, ED: Fecal Occult Bld: POSITIVE — AB

## 2015-04-19 MED ORDER — INSULIN ASPART 100 UNIT/ML ~~LOC~~ SOLN
0.0000 [IU] | Freq: Three times a day (TID) | SUBCUTANEOUS | Status: DC
  Administered 2015-04-20 – 2015-04-22 (×4): 1 [IU] via SUBCUTANEOUS

## 2015-04-19 MED ORDER — INSULIN GLARGINE 100 UNIT/ML ~~LOC~~ SOLN
20.0000 [IU] | Freq: Two times a day (BID) | SUBCUTANEOUS | Status: DC
  Filled 2015-04-19 (×2): qty 0.2

## 2015-04-19 MED ORDER — SALINE SPRAY 0.65 % NA SOLN
1.0000 | Freq: Three times a day (TID) | NASAL | Status: DC | PRN
Start: 2015-04-19 — End: 2015-04-22
  Filled 2015-04-19: qty 44

## 2015-04-19 MED ORDER — PRO-STAT SUGAR FREE PO LIQD
30.0000 mL | Freq: Every day | ORAL | Status: DC
  Administered 2015-04-20 – 2015-04-22 (×2): 30 mL via ORAL
  Filled 2015-04-19 (×3): qty 30

## 2015-04-19 MED ORDER — SODIUM CHLORIDE 0.9 % IV SOLN
80.0000 mg | Freq: Once | INTRAVENOUS | Status: AC
  Administered 2015-04-19: 80 mg via INTRAVENOUS
  Filled 2015-04-19: qty 80

## 2015-04-19 MED ORDER — MECLIZINE HCL 25 MG PO TABS
25.0000 mg | ORAL_TABLET | Freq: Every day | ORAL | Status: DC
  Filled 2015-04-19: qty 1

## 2015-04-19 MED ORDER — SODIUM CHLORIDE 0.9 % IJ SOLN
3.0000 mL | Freq: Two times a day (BID) | INTRAMUSCULAR | Status: DC
Start: 2015-04-19 — End: 2015-04-22
  Administered 2015-04-19 – 2015-04-22 (×5): 3 mL via INTRAVENOUS

## 2015-04-19 MED ORDER — PRO-STAT SUGAR FREE PO LIQD: 30.0000 mL | Freq: Every day | ORAL | Status: DC

## 2015-04-19 MED ORDER — METHOCARBAMOL 500 MG PO TABS
500.0000 mg | ORAL_TABLET | Freq: Four times a day (QID) | ORAL | Status: DC | PRN
  Filled 2015-04-19: qty 1

## 2015-04-19 MED ORDER — LEVOTHYROXINE SODIUM 25 MCG PO TABS
25.0000 ug | ORAL_TABLET | Freq: Every day | ORAL | Status: DC
  Filled 2015-04-19 (×2): qty 1

## 2015-04-19 MED ORDER — POLYETHYLENE GLYCOL 3350 17 G PO PACK
17.0000 g | PACK | Freq: Every day | ORAL | Status: DC | PRN
  Filled 2015-04-19: qty 1

## 2015-04-19 MED ORDER — BISACODYL 10 MG RE SUPP: 10.0000 mg | RECTAL | Status: DC | PRN

## 2015-04-19 MED ORDER — MAGNESIUM HYDROXIDE 400 MG/5ML PO SUSP: 30.0000 mL | Freq: Every day | ORAL | Status: DC | PRN

## 2015-04-19 MED ORDER — HYDROCODONE-ACETAMINOPHEN 5-325 MG PO TABS
1.0000 | ORAL_TABLET | Freq: Four times a day (QID) | ORAL | Status: DC | PRN
  Administered 2015-04-19 – 2015-04-22 (×5): 2 via ORAL
  Filled 2015-04-19 (×5): qty 2

## 2015-04-19 MED ORDER — LAMOTRIGINE 25 MG PO TABS
50.0000 mg | ORAL_TABLET | Freq: Two times a day (BID) | ORAL | Status: DC
Start: 2015-04-19 — End: 2015-04-22
  Administered 2015-04-19 – 2015-04-22 (×7): 50 mg via ORAL
  Filled 2015-04-19 (×8): qty 2

## 2015-04-19 MED ORDER — INSULIN ASPART 100 UNIT/ML ~~LOC~~ SOLN: 0.0000 [IU] | SUBCUTANEOUS | Status: DC

## 2015-04-19 MED ORDER — HYDROCODONE-ACETAMINOPHEN 5-325 MG PO TABS: 1.0000 | ORAL_TABLET | Freq: Four times a day (QID) | ORAL | Status: DC | PRN

## 2015-04-19 MED ORDER — PANTOPRAZOLE SODIUM 40 MG IV SOLR
40.0000 mg | Freq: Two times a day (BID) | INTRAVENOUS | Status: DC
  Filled 2015-04-19: qty 40

## 2015-04-19 MED ORDER — LEVOTHYROXINE SODIUM 25 MCG PO TABS
25.0000 ug | ORAL_TABLET | Freq: Every day | ORAL | Status: DC
  Administered 2015-04-19 – 2015-04-22 (×4): 25 ug via ORAL
  Filled 2015-04-19 (×5): qty 1

## 2015-04-19 MED ORDER — DEXTROSE 50 % IV SOLN
25.0000 mL | Freq: Once | INTRAVENOUS | Status: AC
  Administered 2015-04-19: 25 mL via INTRAVENOUS
  Filled 2015-04-19: qty 50

## 2015-04-19 MED ORDER — TAMSULOSIN HCL 0.4 MG PO CAPS
0.4000 mg | ORAL_CAPSULE | Freq: Every day | ORAL | Status: DC
Start: 2015-04-19 — End: 2015-04-22
  Administered 2015-04-19 – 2015-04-22 (×4): 0.4 mg via ORAL
  Filled 2015-04-19 (×4): qty 1

## 2015-04-19 MED ORDER — HYDROCORTISONE 2.5 % RE CREA: TOPICAL_CREAM | Freq: Two times a day (BID) | RECTAL | Status: DC | PRN

## 2015-04-19 MED ORDER — SODIUM CHLORIDE 0.9 % IV SOLN: Freq: Once | INTRAVENOUS | Status: DC

## 2015-04-19 MED ORDER — SODIUM CHLORIDE 0.9 % IV SOLN
8.0000 mg/h | INTRAVENOUS | Status: AC
  Administered 2015-04-19 – 2015-04-22 (×6): 8 mg/h via INTRAVENOUS
  Filled 2015-04-19 (×14): qty 80

## 2015-04-19 MED ORDER — FUROSEMIDE 20 MG PO TABS
20.0000 mg | ORAL_TABLET | Freq: Every day | ORAL | Status: DC
  Filled 2015-04-19: qty 1

## 2015-04-19 MED ORDER — VITAMIN D 1000 UNITS PO TABS
1000.0000 [IU] | ORAL_TABLET | Freq: Every morning | ORAL | Status: DC
  Filled 2015-04-19: qty 1

## 2015-04-19 MED ORDER — SIMVASTATIN 40 MG PO TABS
40.0000 mg | ORAL_TABLET | Freq: Every day | ORAL | Status: DC
  Filled 2015-04-19: qty 1

## 2015-04-19 MED ORDER — PHENYLEPH-SHARK LIV OIL-MO-PET 0.25-3-14-71.9 % RE OINT
1.0000 "application " | TOPICAL_OINTMENT | Freq: Two times a day (BID) | RECTAL | Status: DC | PRN
  Filled 2015-04-19: qty 28.4

## 2015-04-19 MED ORDER — TRAZODONE HCL 50 MG PO TABS
75.0000 mg | ORAL_TABLET | Freq: Every day | ORAL | Status: DC
  Filled 2015-04-19: qty 1

## 2015-04-19 MED ORDER — FLUOXETINE HCL 20 MG PO CAPS
60.0000 mg | ORAL_CAPSULE | Freq: Every day | ORAL | Status: DC
  Administered 2015-04-19 – 2015-04-22 (×4): 60 mg via ORAL
  Filled 2015-04-19 (×4): qty 3

## 2015-04-19 MED ORDER — TRAZODONE 25 MG HALF TABLET
75.0000 mg | ORAL_TABLET | Freq: Every day | ORAL | Status: DC
  Administered 2015-04-20 – 2015-04-21 (×3): 75 mg via ORAL
  Filled 2015-04-19 (×4): qty 1

## 2015-04-19 MED ORDER — SPIRONOLACTONE 25 MG PO TABS
25.0000 mg | ORAL_TABLET | Freq: Every day | ORAL | Status: DC
  Filled 2015-04-19: qty 1

## 2015-04-19 NOTE — ED Provider Notes (Signed)
CSN: [id number]     Arrival date & time 04/19/15  0218 History   First MD Initiated Contact with Patient 04/19/15 0245     This chart was scribed for [name] Booze, MD by Arlan Organ, ED Scribe. This patient was seen in room A12C/A12C and the patient's care was started 2:59 AM.   Chief Complaint  Patient presents with  . Anemia   The history is provided by the patient and the EMS personnel. No language interpreter was used.    HPI Comments: [name] [name] is a 69 y.o. male with a PMHx of DMII, GERD, hyperlipidemia, and HTN who presents to the Emergency Department here for anemia this evening. Per nursing note, pts Hg reports low at 6.2. Mr. Dube admits to constant, ongoing periumbilical abdominal pain along with dark colored stools. No recent fever, chills, or SOB. Pt with known allergy to Penicillins.  Past Medical History  Diagnosis Date  . Hyperglycemia   . Hyponatremia   . DMII (diabetes mellitus, type 2)   . Depression   . IBS (irritable bowel syndrome)   . Chest pain   . Unspecified gastritis and gastroduodenitis without mention of hemorrhage   . Arthropathy, unspecified, site unspecified   . History of colon polyps   . Anemia, unspecified   . Anxiety   . GERD (gastroesophageal reflux disease)   . Hyperlipidemia   . Hypertension    Past Surgical History  Procedure Laterality Date  . Hernia repair  1964  . Intramedullary (im) nail intertrochanteric Left 01/07/2015    Procedure: INTRAMEDULLARY (IM) NAIL INTERTROCHANTRIC;  Surgeon: Kathryne Hitch, MD;  Location: [facility];  Service: Orthopedics;  Laterality: Left;  . Femur im nail Right 03/24/2015    Procedure: CLOSED REDUCTION INTERNAL FIXATION;  Surgeon: Kerrin Champagne, MD;  Location: [facility] OR;  Service: Orthopedics;  Laterality: Right;   Family History  Problem Relation Age of Onset  . Stroke Mother   . Heart attack Father   . Heart disease Father   . Cancer Other     FH of Breast  Cancer-other Relative  . Heart disease Other     Parent  . Hypertension Other     parent  . Cancer Maternal Uncle     colon   History  Substance Use Topics  . Smoking status: Former Smoker -- 1.00 packs/day    Types: Cigarettes  . Smokeless tobacco: Not on file  . Alcohol Use: No    Review of Systems  Constitutional: Negative for fever and chills.  Respiratory: Negative for cough and shortness of breath.   Cardiovascular: Negative for chest pain.  Gastrointestinal: Positive for abdominal pain. Negative for nausea, vomiting and diarrhea.  Skin: Negative for rash.      Allergies  Penicillins  Home Medications   Prior to Admission medications   Medication Sig Start Date End Date Taking? Authorizing Provider  acetaminophen (TYLENOL) 500 MG tablet Take 1,000 mg by mouth every 4 (four) hours as needed.   Yes Historical Provider, MD  Amino Acids-Protein Hydrolys (FEEDING SUPPLEMENT, PRO-STAT SUGAR FREE 64,) LIQD Take 30 mLs by mouth daily.   Yes Historical Provider, MD  aspirin EC 325 MG EC tablet Take 1 tablet (325 mg total) by mouth daily with breakfast. 01/09/15  Yes Kathryne Hitch, MD  bisacodyl (DULCOLAX) 10 MG suppository Place 10 mg rectally as needed for moderate constipation.   Yes Historical Provider, MD  Cholecalciferol (VITAMIN D3) 1000 UNITS  CAPS Take 1,000 Units by mouth every morning.    Yes Historical Provider, MD  ferrous sulfate 325 (65 FE) MG tablet Take 325 mg by mouth daily with breakfast.   Yes Historical Provider, MD  FLUoxetine (PROZAC) 20 MG capsule Take 60 mg by mouth daily.   Yes Historical Provider, MD  furosemide (LASIX) 20 MG tablet Take 20 mg by mouth daily.   Yes Historical Provider, MD  HYDROcodone-acetaminophen (NORCO/VICODIN) 5-325 MG per tablet Take 1-2 tablets by mouth every 6 (six) hours as needed for moderate pain. 04/14/15  Yes Tiffany L Reed, DO  insulin glargine (LANTUS) 100 UNIT/ML injection Inject 0.3 mLs (30 Units total) into the  skin 2 (two) times daily. 03/27/15  Yes Penny Piarlando Vega, MD  lamoTRIgine (LAMICTAL) 25 MG tablet Take 50 mg by mouth 2 (two) times daily.   Yes Historical Provider, MD  levothyroxine (SYNTHROID, LEVOTHROID) 25 MCG tablet Take 25 mcg by mouth daily. 10/10/13  Yes Historical Provider, MD  magnesium hydroxide (MILK OF MAGNESIA) 400 MG/5ML suspension Take 30 mLs by mouth daily as needed for mild constipation.   Yes Historical Provider, MD  meclizine (ANTIVERT) 25 MG tablet Take 25 mg by mouth daily.   Yes Historical Provider, MD  Melatonin 10 MG TABS Take 10 mg by mouth at bedtime.   Yes Historical Provider, MD  methocarbamol (ROBAXIN) 500 MG tablet Take 1 tablet (500 mg total) by mouth every 6 (six) hours as needed for muscle spasms. 01/09/15  Yes Kathryne Hitchhristopher Y Blackman, MD  Multiple Vitamin (MULTIVITAMIN WITH MINERALS) TABS tablet Take 1 tablet by mouth daily.   Yes Historical Provider, MD  omeprazole (PRILOSEC) 20 MG capsule Take 20 mg by mouth every morning.    Yes Historical Provider, MD  phenylephrine-shark liver oil-mineral oil-petrolatum (PREPARATION H) 0.25-3-14-71.9 % rectal ointment Place 1 application rectally 2 (two) times daily as needed for hemorrhoids.   Yes Historical Provider, MD  polyethylene glycol (MIRALAX / GLYCOLAX) packet Take 17 g by mouth daily as needed for mild constipation.    Yes Historical Provider, MD  promethazine (PHENERGAN) 25 MG tablet Take 25 mg by mouth every 6 (six) hours as needed for nausea or vomiting.   Yes Historical Provider, MD  simvastatin (ZOCOR) 40 MG tablet Take 40 mg by mouth at bedtime.  11/12/13  Yes Historical Provider, MD  sodium chloride (OCEAN) 0.65 % SOLN nasal spray Place 1 spray into both nostrils 3 (three) times daily as needed (for nosebleed).   Yes Historical Provider, MD  Sodium Phosphates (RA SALINE ENEMA RE) Place 1 each rectally as needed (for constipation).   Yes Historical Provider, MD  spironolactone (ALDACTONE) 25 MG tablet Take 25 mg by  mouth daily.   Yes Historical Provider, MD  tamsulosin (FLOMAX) 0.4 MG CAPS capsule Take 0.4 mg by mouth daily.    Yes Historical Provider, MD  traZODone (DESYREL) 50 MG tablet Take 75 mg by mouth at bedtime.    Yes Historical Provider, MD  UNABLE TO FIND Take 1 each by mouth 2 (two) times daily. Med Name: magic cup   Yes Historical Provider, MD  glucose blood test strip 1 each by Other route 4 (four) times daily -  before meals and at bedtime. Use as instructed    Historical Provider, MD  insulin aspart (NOVOLOG) 100 UNIT/ML injection Inject 0-9 Units into the skin 3 (three) times daily with meals. 01/10/15   Calvert CantorSaima Rizwan, MD   Triage Vitals: BP 144/61 mmHg  Pulse 81  Temp(Src)  97.7 F (36.5 C) (Oral)  Resp 12  Ht  (1.676 m)  Wt 166 lb (75.297 kg)  BMI 26.81 kg/m2  SpO2 100%   Physical Exam  Constitutional: He is oriented to person, place, and time. He appears well-developed and well-nourished.  HENT:  Head: Normocephalic and atraumatic.  Eyes: EOM are normal. Pupils are equal, round, and reactive to light.  Neck: Normal range of motion. Neck supple. No JVD present.  Cardiovascular: Normal rate, regular rhythm and intact distal pulses.   Murmur heard. holosystolic systolic murmur to L sternal border  Pulmonary/Chest: Effort normal and breath sounds normal. No respiratory distress.  Abdominal: Soft. Bowel sounds are normal. He exhibits no distension and no mass. There is no tenderness.  Genitourinary: Guaiac positive stool.  Normal sphincter tone. Small amount of formed dark brown stool present. No masses.  Musculoskeletal: He exhibits edema. He exhibits no tenderness.  2+ pitting edema to lower extremities Pain with range of motion of the right hip.  Lymphadenopathy:    He has no cervical adenopathy.  Neurological: He is alert and oriented to person, place, and time. No cranial nerve deficit. He exhibits normal muscle tone. Coordination normal.  Skin: Skin is warm and dry. No  rash noted.  Dressings are present over sacral decubiti and are not removed at this time.  Psychiatric: He has a normal mood and affect. His behavior is normal. Judgment and thought content normal.  Nursing note and vitals reviewed.   ED Course  Procedures (including critical care time)  DIAGNOSTIC STUDIES: Oxygen Saturation is 100% on RA, Normal by my interpretation.    COORDINATION OF CARE: 3:06 AM- Will order CBC, CMP, EKG, and sample from blood bank. Discussed treatment plan with pt at bedside and pt agreed to plan.     Labs Review Results for orders placed or performed during the hospital encounter of 04/19/15  CBC  Result Value Ref Range   WBC 2.1 (L) 4.0 - 10.5 K/uL   RBC 2.28 (L) 4.22 - 5.81 MIL/uL   Hemoglobin 6.5 (LL) 13.0 - 17.0 g/dL   HCT 57.8 (L) 46.9 - 62.9 %   MCV 90.4 78.0 - 100.0 fL   MCH 28.5 26.0 - 34.0 pg   MCHC 31.6 30.0 - 36.0 g/dL   RDW 52.8 (H) 41.3 - 24.4 %   Platelets 141 (L) 150 - 400 K/uL  Comprehensive metabolic panel  Result Value Ref Range   Sodium 139 135 - 145 mmol/L   Potassium 4.2 3.5 - 5.1 mmol/L   Chloride 108 101 - 111 mmol/L   CO2 23 22 - 32 mmol/L   Glucose, Bld 69 (L) 70 - 99 mg/dL   BUN 27 (H) 6 - 20 mg/dL   Creatinine, Ser 0.10 0.61 - 1.24 mg/dL   Calcium 8.2 (L) 8.9 - 10.3 mg/dL   Total Protein 6.9 6.5 - 8.1 g/dL   Albumin 1.8 (L) 3.5 - 5.0 g/dL   AST 71 (H) 15 - 41 U/L   ALT 30 17 - 63 U/L   Alkaline Phosphatase 297 (H) 38 - 126 U/L   Total Bilirubin 0.9 0.3 - 1.2 mg/dL   GFR calc non Af Amer >60 >60 mL/min   GFR calc Af Amer >60 >60 mL/min   Anion gap 8 5 - 15  POC occult blood, ED Provider will collect  Result Value Ref Range   Fecal Occult Bld POSITIVE (A) NEGATIVE  Sample to Blood Bank  Result Value Ref Range  Blood Bank Specimen SAMPLE AVAILABLE FOR TESTING    Sample Expiration 04/22/2015   Type and screen  Result Value Ref Range   ABO/RH(D) O POS    Antibody Screen NEG    Sample Expiration 04/22/2015       EKG Interpretation   Date/Time:  Saturday Apr 19 2015 02:28:39 EDT Ventricular Rate:  82 PR Interval:  158 QRS Duration: 91 QT Interval:  411 QTC Calculation: 480 R Axis:   -17 Text Interpretation:  Sinus rhythm Borderline left axis deviation Probable  anteroseptal infarct, old When compared with ECG of 03/24/2015, No  significant change was found Confirmed by [facility]  MD, Joahan Swatzell ([id number]) on  04/19/2015 2:32:53 AM      MDM   Final diagnoses:  Pancytopenia  Hepatic cirrhosis, unspecified hepatic cirrhosis type  Guaiac positive stools    Progressive anemia with Hemoccult-positive stool. He will need to be admitted for blood transfusion and evaluation for source of blood loss. Old records are reviewed and he was hospitalized last month following a hip fracture. Discharge hemoglobin was 8.3. Case is discussed with Dr. Julian ReilGardner of [facility] who agrees to admit the patient.  I personally performed the services described in this documentation, which was scribed in my presence. The recorded information has been reviewed and is accurate.      [name] Boozeavid Kaylynne Andres, MD 04/19/15 [phone]

## 2015-04-19 NOTE — ED Notes (Signed)
CRITICAL VALUE ALERT  Critical value received: Hg 6.5  Date of notification:  04/19/2015

## 2015-04-19 NOTE — ED Notes (Signed)
[facility] for [facility] Hg. 6.2, pt denies pain or SOB at this time. VSS HR 72 BP 142/68, R-20, 98% RA.

## 2015-04-19 NOTE — H&P (Signed)
[facility] History and Physical  [name] ZOX:[id number] DOB: 07/22/1946 DOA: 04/19/2015  Referring physician: EDP PCP: Margit Hanks, MD   Chief Complaint: Anemia   HPI: [name] is a 69 y.o. male with h/o cirrhosis of the liver, has broken both hips this year, just discharged from hospital after admission for hip fracture and repair.  Patient presents to ED after his HGB was checked at the NH due to several days of black tarry stools.  His HGB at the NH read low at 6.2.  Patient admits to constant, ongoing, periumbilical abdominal pain as well as dark colored stools for the past "couple of days".  States that "Dr. Clelia Croft" is his gastroenterologist (i think he ment to say Dr. Matthias Hughs who has seen him previously per [facility] records, Dr. 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  Review of Systems: denies vomiting, fever, chills, SOB, BRBPR, Systems reviewed.  As above, otherwise negative  Past Medical History  Diagnosis Date  . Hyperglycemia   . Hyponatremia   . DMII (diabetes mellitus, type 2)   . Depression   . IBS (irritable bowel syndrome)   . Chest pain   . Unspecified gastritis and gastroduodenitis without mention of hemorrhage   . Arthropathy, unspecified, site unspecified   . History of colon polyps   . Anemia, unspecified   . Anxiety   . GERD (gastroesophageal reflux disease)   . Hyperlipidemia   . Hypertension    Past Surgical History  Procedure Laterality Date  . Hernia repair  1964  . Intramedullary (im) nail intertrochanteric Left 01/07/2015    Procedure: INTRAMEDULLARY (IM) NAIL INTERTROCHANTRIC;  Surgeon: Kathryne Hitch, MD;  Location: [facility];  Service: Orthopedics;  Laterality: Left;  . Femur im nail Right 03/24/2015    Procedure: CLOSED REDUCTION INTERNAL FIXATION;  Surgeon: Kerrin Champagne, MD;  Location: [facility];  Service: Orthopedics;  Laterality: Right;    Social History:  reports that he has quit smoking. His smoking use included Cigarettes. He smoked 1.00 pack per day. He does not have any smokeless tobacco history on file. He reports that he does not drink alcohol or use illicit drugs.  Allergies  Allergen Reactions  . Penicillins Hives    Family History  Problem Relation Age of Onset  . Stroke Mother   . Heart attack Father   . Heart disease Father   . Cancer Other     FH of Breast Cancer-other Relative  . Heart disease Other     Parent  . Hypertension Other     parent  . Cancer Maternal Uncle     colon     Prior to Admission medications   Medication Sig Start Date End Date Taking? Authorizing Provider  acetaminophen (TYLENOL) 500 MG tablet Take 1,000 mg by mouth every 4 (four) hours as needed.   Yes Historical Provider, MD  Amino Acids-Protein Hydrolys (FEEDING SUPPLEMENT, PRO-STAT SUGAR FREE 64,) LIQD Take 30 mLs by mouth daily.   Yes Historical Provider, MD  aspirin EC 325 MG EC tablet Take 1 tablet (325 mg total) by mouth daily with breakfast. 01/09/15  Yes Kathryne Hitch, MD  bisacodyl (DULCOLAX) 10 MG suppository Place 10 mg rectally as needed for moderate constipation.   Yes Historical Provider, MD  Cholecalciferol (VITAMIN D3) 1000 UNITS CAPS Take 1,000 Units by mouth every morning.    Yes Historical Provider, MD  ferrous sulfate  325 (65 FE) MG tablet Take 325 mg by mouth daily with breakfast.   Yes Historical Provider, MD  FLUoxetine (PROZAC) 20 MG capsule Take 60 mg by mouth daily.   Yes Historical Provider, MD  furosemide (LASIX) 20 MG tablet Take 20 mg by mouth daily.   Yes Historical Provider, MD  HYDROcodone-acetaminophen (NORCO/VICODIN) 5-325 MG per tablet Take 1-2 tablets by mouth every 6 (six) hours as needed for moderate pain. 04/14/15  Yes Tiffany L Reed, DO  insulin glargine (LANTUS) 100 UNIT/ML injection Inject 0.3 mLs (30 Units total) into the skin 2 (two) times daily. 03/27/15  Yes Penny Piarlando Vega, MD   lamoTRIgine (LAMICTAL) 25 MG tablet Take 50 mg by mouth 2 (two) times daily.   Yes Historical Provider, MD  levothyroxine (SYNTHROID, LEVOTHROID) 25 MCG tablet Take 25 mcg by mouth daily. 10/10/13  Yes Historical Provider, MD  magnesium hydroxide (MILK OF MAGNESIA) 400 MG/5ML suspension Take 30 mLs by mouth daily as needed for mild constipation.   Yes Historical Provider, MD  meclizine (ANTIVERT) 25 MG tablet Take 25 mg by mouth daily.   Yes Historical Provider, MD  Melatonin 10 MG TABS Take 10 mg by mouth at bedtime.   Yes Historical Provider, MD  methocarbamol (ROBAXIN) 500 MG tablet Take 1 tablet (500 mg total) by mouth every 6 (six) hours as needed for muscle spasms. 01/09/15  Yes Kathryne Hitchhristopher Y Blackman, MD  Multiple Vitamin (MULTIVITAMIN WITH MINERALS) TABS tablet Take 1 tablet by mouth daily.   Yes Historical Provider, MD  omeprazole (PRILOSEC) 20 MG capsule Take 20 mg by mouth every morning.    Yes Historical Provider, MD  phenylephrine-shark liver oil-mineral oil-petrolatum (PREPARATION H) 0.25-3-14-71.9 % rectal ointment Place 1 application rectally 2 (two) times daily as needed for hemorrhoids.   Yes Historical Provider, MD  polyethylene glycol (MIRALAX / GLYCOLAX) packet Take 17 g by mouth daily as needed for mild constipation.    Yes Historical Provider, MD  promethazine (PHENERGAN) 25 MG tablet Take 25 mg by mouth every 6 (six) hours as needed for nausea or vomiting.   Yes Historical Provider, MD  simvastatin (ZOCOR) 40 MG tablet Take 40 mg by mouth at bedtime.  11/12/13  Yes Historical Provider, MD  sodium chloride (OCEAN) 0.65 % SOLN nasal spray Place 1 spray into both nostrils 3 (three) times daily as needed (for nosebleed).   Yes Historical Provider, MD  Sodium Phosphates (RA SALINE ENEMA RE) Place 1 each rectally as needed (for constipation).   Yes Historical Provider, MD  spironolactone (ALDACTONE) 25 MG tablet Take 25 mg by mouth daily.   Yes Historical Provider, MD  tamsulosin  (FLOMAX) 0.4 MG CAPS capsule Take 0.4 mg by mouth daily.    Yes Historical Provider, MD  traZODone (DESYREL) 50 MG tablet Take 75 mg by mouth at bedtime.    Yes Historical Provider, MD  UNABLE TO FIND Take 1 each by mouth 2 (two) times daily. Med Name: magic cup   Yes Historical Provider, MD  glucose blood test strip 1 each by Other route 4 (four) times daily -  before meals and at bedtime. Use as instructed    Historical Provider, MD  insulin aspart (NOVOLOG) 100 UNIT/ML injection Inject 0-9 Units into the skin 3 (three) times daily with meals. 01/10/15   Calvert CantorSaima Rizwan, MD   Physical Exam: Filed Vitals:   04/19/15 0500  BP: 131/56  Pulse: 75  Temp:   Resp: 14    BP 131/56 mmHg  Pulse  75  Temp(Src) 97.7 F (36.5 C) (Oral)  Resp 14  Ht 5\' 6"  (1.676 m)  Wt 75.297 kg (166 lb)  BMI 26.81 kg/m2  SpO2 99%  General Appearance:    Alert, oriented, no distress, appears stated age, pale complexion.  Head:    Normocephalic, atraumatic  Eyes:    PERRL, EOMI, sclera non-icteric        Nose:   Nares without drainage or epistaxis. Mucosa, turbinates normal  Throat:   Moist mucous membranes. Oropharynx without erythema or exudate.  Neck:   Supple. No carotid bruits.  No thyromegaly.  No lymphadenopathy.   Back:     No CVA tenderness, no spinal tenderness  Lungs:     Clear to auscultation bilaterally, without wheezes, rhonchi or rales  Chest wall:    No tenderness to palpitation  Heart:    Regular rate and rhythm, murmur present  Abdomen:     Soft, non-tender, nondistended, normal bowel sounds, no organomegaly  Genitalia:    deferred  Rectal:    deferred  Extremities:   No clubbing, cyanosis, 2+ edema  Pulses:   2+ and symmetric all extremities  Skin:   Skin color, texture, turgor normal, Dressings present over sacral decubiti.  Lymph nodes:   Cervical, supraclavicular, and axillary nodes normal  Neurologic:   CNII-XII intact. Normal strength, sensation and reflexes      throughout     Labs on Admission:  Basic Metabolic Panel:  Recent Labs Lab 04/19/15 0235  NA 139  K 4.2  CL 108  CO2 23  GLUCOSE 69*  BUN 27*  CREATININE 1.07  CALCIUM 8.2*   Liver Function Tests:  Recent Labs Lab 04/19/15 0235  AST 71*  ALT 30  ALKPHOS 297*  BILITOT 0.9  PROT 6.9  ALBUMIN 1.8*   No results for input(s): LIPASE, AMYLASE in the last 168 hours. No results for input(s): AMMONIA in the last 168 hours. CBC:  Recent Labs Lab 04/19/15 0235  WBC 2.1*  HGB 6.5*  HCT 20.6*  MCV 90.4  PLT 141*   Cardiac Enzymes: No results for input(s): CKTOTAL, CKMB, CKMBINDEX, TROPONINI in the last 168 hours.  BNP (last 3 results) No results for input(s): PROBNP in the last 8760 hours. CBG: No results for input(s): GLUCAP in the last 168 hours.  Radiological Exams on Admission: No results found.  EKG: Independently reviewed.  Assessment/Plan Principal Problem:   Acute blood loss anemia Active Problems:   DM (diabetes mellitus), type 2 with complications   Liver cirrhosis   GI bleed   1. 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 Wynonia MustyShaddad's office note for more info on the chronic component). 1. Empiric PPI GTT and bolus ordered 2. Has h/o cirrhosis of the liver, but does NOT have history of known esophageal varicies and this is not a very fast bleed at this time given the symptoms (and lack of PO or PR output since arrival in the ED), so will hold off on ordering octreotide gtt for now. 3. Holding all anticoagulants including home ASA (SCDs only for DVT ppx) 4. NPO 5. Call GI in AM re: EGD 6. Transfusing 2 units PRBC 7. Checking PT-INR 8. Platelets are 140 at this time. 2. DM2 - slightly hypoglycemic in ED 1. Reduce home lantus dose to 20 BID from 30 BID currently 2. Add low dose SSI q4h 3. Hip pain - due to  recent hip fracture s/p ORIF 1. Pain control with continued  norco    Code Status: DNR Family Communication: No family in room Disposition Plan: Admit to inpatient   Time spent: 70 min  GARDNER, JARED M. [facility] Pager [phone]  If 7AM-7PM, please contact the day team taking care of the patient [email or web address] Password [email or web address] 04/19/2015, 5:29 AM

## 2015-04-19 NOTE — Consult Note (Signed)
Referring Provider: Dr. Crista Curborinna Sullivan ([facility]) Primary Care Physician:  Margit HanksALEXANDER, [name], MD Primary Gastroenterologist:  Dr. Matthias HughsBuccini  Reason for Consultation:  Anemia, heme positive stool  HPI: [name] is a 69 y.o. male known to me for the past several years from outpatient care, whom, ironically, I just saw in the office for routine follow-up 2 days ago.  He has a history of presumed cirrhosis (never biopsied) which is probably multifactorial in origin, including agent orange exposure in [place], previous heavy ethanol use, and NASH.  Although he has never had a frank GI bleed, he does have a history of recurrent heme positivity with anemia, and had endoscopy and colonoscopy in both 2010 and 2013, and upper endoscopy again in September 2014 (which showed grade 1 esophageal varices). No discrete source for heme positivity or anemia has been identified on any of these exams.  With that background, the patient became a resident at [facility] skilled nursing facility following a hip fracture last January. Then, about a month ago, he fell and broke his other leg.  An ultrasound yesterday showed a pleural effusion, ascites, and splenomegaly.  The patient's hemoglobin on April 13, prior to discharge following his second leg fracture operation, was 8.3 with an MCV of 90. His platelet count at that time was 84,000, a bit lower than normal. Yesterday, he was noted to have dark stools (although he is on iron) and he was sent to the emergency room, where his hemoglobin was 6.5, MCV 90, platelets 141,000.  Throughout this time, his BUN has remained slightly elevated, around 30 (it was 27 at time of admission last night). He has never had clinically overt GI bleeding. His stool in the emergency room was brown but Hemoccult-positive.  The patient feels well despite all this, and really does not have any localizing GI tract symptoms.  He was not on ulcerogenic  medications prior to admission, but he was also not on any PPI therapy.    Past Medical History  Diagnosis Date  . Hyperglycemia   . Hyponatremia   . DMII (diabetes mellitus, type 2)   . Depression   . IBS (irritable bowel syndrome)   . Chest pain   . Unspecified gastritis and gastroduodenitis without mention of hemorrhage   . Arthropathy, unspecified, site unspecified   . History of colon polyps   . Anemia, unspecified   . Anxiety   . GERD (gastroesophageal reflux disease)   . Hyperlipidemia   . Hypertension     Past Surgical History  Procedure Laterality Date  . Hernia repair  1964  . Intramedullary (im) nail intertrochanteric Left 01/07/2015    Procedure: INTRAMEDULLARY (IM) NAIL INTERTROCHANTRIC;  Surgeon: Kathryne Hitchhristopher Y Blackman, MD;  Location: [facility];  Service: Orthopedics;  Laterality: Left;  . Femur im nail Right 03/24/2015    Procedure: CLOSED REDUCTION INTERNAL FIXATION;  Surgeon: Kerrin ChampagneJames E Nitka, MD;  Location: [facility];  Service: Orthopedics;  Laterality: Right;    Prior to Admission medications   Medication Sig Start Date End Date Taking? Authorizing Provider  acetaminophen (TYLENOL) 500 MG tablet Take 1,000 mg by mouth every 4 (four) hours as needed.   Yes Historical Provider, MD  Amino Acids-Protein Hydrolys (FEEDING SUPPLEMENT, PRO-STAT SUGAR FREE 64,) LIQD Take 30 mLs by mouth daily.   Yes Historical Provider, MD  aspirin EC 325 MG EC tablet Take 1 tablet (325 mg total) by mouth daily with breakfast. 01/09/15  Yes Kathryne Hitchhristopher Y Blackman, MD  bisacodyl (DULCOLAX) 10 MG suppository Place 10 mg rectally as needed for moderate constipation.   Yes Historical Provider, MD  Cholecalciferol (VITAMIN D3) 1000 UNITS CAPS Take 1,000 Units by mouth every morning.    Yes Historical Provider, MD  ferrous sulfate 325 (65 FE) MG tablet Take 325 mg by mouth daily with breakfast.   Yes Historical Provider, MD  FLUoxetine (PROZAC) 20 MG capsule Take 60 mg by mouth daily.   Yes Historical  Provider, MD  furosemide (LASIX) 20 MG tablet Take 20 mg by mouth daily.   Yes Historical Provider, MD  HYDROcodone-acetaminophen (NORCO/VICODIN) 5-325 MG per tablet Take 1-2 tablets by mouth every 6 (six) hours as needed for moderate pain. 04/14/15  Yes Tiffany L Reed, DO  insulin glargine (LANTUS) 100 UNIT/ML injection Inject 0.3 mLs (30 Units total) into the skin 2 (two) times daily. 03/27/15  Yes Penny Piarlando Vega, MD  lamoTRIgine (LAMICTAL) 25 MG tablet Take 50 mg by mouth 2 (two) times daily.   Yes Historical Provider, MD  levothyroxine (SYNTHROID, LEVOTHROID) 25 MCG tablet Take 25 mcg by mouth daily. 10/10/13  Yes Historical Provider, MD  magnesium hydroxide (MILK OF MAGNESIA) 400 MG/5ML suspension Take 30 mLs by mouth daily as needed for mild constipation.   Yes Historical Provider, MD  meclizine (ANTIVERT) 25 MG tablet Take 25 mg by mouth daily.   Yes Historical Provider, MD  Melatonin 10 MG TABS Take 10 mg by mouth at bedtime.   Yes Historical Provider, MD  methocarbamol (ROBAXIN) 500 MG tablet Take 1 tablet (500 mg total) by mouth every 6 (six) hours as needed for muscle spasms. 01/09/15  Yes Kathryne Hitchhristopher Y Blackman, MD  Multiple Vitamin (MULTIVITAMIN WITH MINERALS) TABS tablet Take 1 tablet by mouth daily.   Yes Historical Provider, MD  omeprazole (PRILOSEC) 20 MG capsule Take 20 mg by mouth every morning.    Yes Historical Provider, MD  phenylephrine-shark liver oil-mineral oil-petrolatum (PREPARATION H) 0.25-3-14-71.9 % rectal ointment Place 1 application rectally 2 (two) times daily as needed for hemorrhoids.   Yes Historical Provider, MD  polyethylene glycol (MIRALAX / GLYCOLAX) packet Take 17 g by mouth daily as needed for mild constipation.    Yes Historical Provider, MD  promethazine (PHENERGAN) 25 MG tablet Take 25 mg by mouth every 6 (six) hours as needed for nausea or vomiting.   Yes Historical Provider, MD  simvastatin (ZOCOR) 40 MG tablet Take 40 mg by mouth at bedtime.  11/12/13  Yes  Historical Provider, MD  sodium chloride (OCEAN) 0.65 % SOLN nasal spray Place 1 spray into both nostrils 3 (three) times daily as needed (for nosebleed).   Yes Historical Provider, MD  Sodium Phosphates (RA SALINE ENEMA RE) Place 1 each rectally as needed (for constipation).   Yes Historical Provider, MD  spironolactone (ALDACTONE) 25 MG tablet Take 25 mg by mouth daily.   Yes Historical Provider, MD  tamsulosin (FLOMAX) 0.4 MG CAPS capsule Take 0.4 mg by mouth daily.    Yes Historical Provider, MD  traZODone (DESYREL) 50 MG tablet Take 75 mg by mouth at bedtime.    Yes Historical Provider, MD  UNABLE TO FIND Take 1 each by mouth 2 (two) times daily. Med Name: magic cup   Yes Historical Provider, MD  glucose blood test strip 1 each by Other route 4 (four) times daily -  before meals and at bedtime. Use as instructed    Historical Provider, MD  insulin aspart (NOVOLOG) 100 UNIT/ML injection Inject 0-9 Units into  the skin 3 (three) times daily with meals. 01/10/15   Calvert Cantor, MD    Current Facility-Administered Medications  Medication Dose Route Frequency Provider Last Rate Last Dose  . 0.9 %  sodium chloride infusion   Intravenous Once Hillary Bow, DO      . bisacodyl (DULCOLAX) suppository 10 mg  10 mg Rectal PRN Hillary Bow, DO      . feeding supplement (PRO-STAT SUGAR FREE 64) liquid 30 mL  30 mL Oral Daily Christiane Ha, MD   30 mL at 04/19/15 0928  . FLUoxetine (PROZAC) capsule 60 mg  60 mg Oral Daily Hillary Bow, DO   60 mg at 04/19/15 1610  . hydrocortisone (ANUSOL-HC) 2.5 % rectal cream   Rectal BID PRN Hillary Bow, DO      . insulin aspart (novoLOG) injection 0-9 Units  0-9 Units Subcutaneous TID WC Christiane Ha, MD   0 Units at 04/19/15 1253  . lamoTRIgine (LAMICTAL) tablet 50 mg  50 mg Oral BID Hillary Bow, DO   50 mg at 04/19/15 9604  . levothyroxine (SYNTHROID, LEVOTHROID) tablet 25 mcg  25 mcg Oral QAC breakfast Christiane Ha, MD   25 mcg at  04/19/15 0926  . magnesium hydroxide (MILK OF MAGNESIA) suspension 30 mL  30 mL Oral Daily PRN Hillary Bow, DO      . pantoprazole (PROTONIX) 80 mg in sodium chloride 0.9 % 250 mL (0.32 mg/mL) infusion  8 mg/hr Intravenous Continuous Hillary Bow, DO 25 mL/hr at 04/19/15 0536 8 mg/hr at 04/19/15 0536  . [START ON 04/22/2015] pantoprazole (PROTONIX) injection 40 mg  40 mg Intravenous Q12H Hillary Bow, DO      . polyethylene glycol (MIRALAX / GLYCOLAX) packet 17 g  17 g Oral Daily PRN Hillary Bow, DO      . sodium chloride (OCEAN) 0.65 % nasal spray 1 spray  1 spray Each Nare TID PRN Hillary Bow, DO      . sodium chloride 0.9 % injection 3 mL  3 mL Intravenous Q12H Hillary Bow, DO   3 mL at 04/19/15 1000  . tamsulosin (FLOMAX) capsule 0.4 mg  0.4 mg Oral Daily Hillary Bow, DO   0.4 mg at 04/19/15 5409  . traZODone (DESYREL) tablet 75 mg  75 mg Oral QHS Christiane Ha, MD        Allergies as of 04/19/2015 - Review Complete 04/19/2015  Allergen Reaction Noted  . Penicillins Hives 01/18/2012    Family History  Problem Relation Age of Onset  . Stroke Mother   . Heart attack Father   . Heart disease Father   . Cancer Other     FH of Breast Cancer-other Relative  . Heart disease Other     Parent  . Hypertension Other     parent  . Cancer Maternal Uncle     colon    History   Social History  . Marital Status: Divorced    Spouse Name: N/A  . Number of Children: N/A  . Years of Education: N/A   Occupational History  . Not on file.   Social History Main Topics  . Smoking status: Former Smoker -- 1.00 packs/day    Types: Cigarettes  . Smokeless tobacco: Not on file  . Alcohol Use: No  . Drug Use: No  . Sexual Activity: Yes   Other Topics Concern  . Not on file   Social  History Narrative    Review of Systems: Negative for cardiopulmonary symptoms, malaise, poor appetite, breathing difficulties  Physical Exam: Vital signs in last 24  hours: Temp:  [97.7 F (36.5 C)-98 F (36.7 C)] 98 F (36.7 C) (05/07 0619) Pulse Rate:  [72-84] 73 (05/07 0619) Resp:  [8-19] 16 (05/07 0619) BP: (122-146)/(54-86) 132/64 mmHg (05/07 0619) SpO2:  [99 %-100 %] 100 % (05/07 0619) Weight:  [75.297 kg (166 lb)] 75.297 kg (166 lb) (05/07 1610)   General:   Alert,  Well-developed, well-nourished, pleasant and cooperative in NAD Head:  Normocephalic and atraumatic. Eyes:  Sclera clear, no icterus.   Conjunctiva pink. Mouth:   No ulcerations or lesions.  Oropharynx pink but dry. Neck:   No masses or thyromegaly. Lungs:  Clear throughout to auscultation.   No wheezes, crackles, or rhonchi. No evident respiratory distress. Heart:   Regular rate and rhythm; 3/6 murmur, clicks, rubs,  or gallops. Abdomen:  Very protuberant and somewhat firm, nontender, nontympaniti. No masses, hepatosplenomegaly or ventral hernias noted. Small umbilical hernia present, without bruits, guarding, or rebound.   Rectal:  Not performed by me, but by description, stool is brown and Hemoccult positive   Msk:   Symmetrical without gross deformities. Extremities:   Without clubbing or cyanosis, mild to moderate lower extremity pitting edema  . Neurologic:  Alert and coherent;  No asterixis. Does serial three's with approximately 50% accuracy. No focal deficits. Skin:  Intact without significant lesions or rashes. Cervical Nodes:  No significant cervical adenopathy. Psych:   Alert and cooperative. Normal mood and affect.  Intake/Output from previous day:   Intake/Output this shift: Total I/O In: 760 [P.O.:760] Out: -   Lab Results:  Recent Labs  04/19/15 0235 04/19/15 1151  WBC 2.1*  --   HGB 6.5* 6.0*  HCT 20.6* 19.6*  PLT 141*  --    BMET  Recent Labs  04/19/15 0235  NA 139  K 4.2  CL 108  CO2 23  GLUCOSE 69*  BUN 27*  CREATININE 1.07  CALCIUM 8.2*   LFT  Recent Labs  04/19/15 0235  PROT 6.9  ALBUMIN 1.8*  AST 71*  ALT 30  ALKPHOS  297*  BILITOT 0.9   PT/INR  Recent Labs  04/19/15 0235  LABPROT 18.3*  INR 1.50*    Studies/Results: No results found.  Impression:  1. Heme positive stool, without overt GI bleeding 2. Severe anemia, normocytic, superimposed on chronic moderate anemia 3. Cirrhosis 4. Ascites and edema 5. Previous history of minimal esophageal varices This patient appears to have chronic low-grade blood loss contributing to his anemia. Interestingly, he is not microcytic although iron studies have not been performed in the recent past.  Plan: 1. Consider updated endoscopic evaluation under propofol sedation in a couple of days 2. The patient has never had a capsule endoscopy of the small bowel. This would be reasonable to consider while he is an inpatient   LOS: 0 days   Shatisha Falter V  04/19/2015, 1:05 PM   Pager [phone] If no answer or after 5 PM call [phone]

## 2015-04-19 NOTE — Progress Notes (Addendum)
Hypoglycemic Event  CBG: 64  Treatment: 15 GM carbohydrate snack  Symptoms: None  Follow-up CBG: Time: 0913 CBG Result: 107  Possible Reasons for Event: Patient was NPO, now changed to CL diet.  Comments/MD notified: MD Lendell CapriceSullivan paged and aware and current blood sugar, will continue to monitor patient.    Leane PlattScanlon, Elzora Cullins  Remember to initiate Hypoglycemia Order Set & complete

## 2015-04-19 NOTE — ED Notes (Signed)
Report attempted, RN not available. 

## 2015-04-19 NOTE — Progress Notes (Signed)
Patient admitted through the ED with Anemia and Gi bleed. On arrival, alert and oriented. Looked pale with dry skin. Oriented to the room and placed on cardiac monitoring. Call bell placed within reach as well.

## 2015-04-19 NOTE — Progress Notes (Addendum)
Patient admitted after midnight. Chart reviewed. Patient examined. Currently, IV nurse is trying to get an IV. He has not received his 2 units of packed red blood cells. He has dry mucous membranes and appears pale. Vital signs are stable. Have called Dr. Matthias HughsBuccini to consult.  In reviewing chart, it appears he had a flexible sigmoidoscopy and a colonoscopy in 2013 by Dr. Matthias HughsBuccini which showed hemorrhoids and diverticula. I don't see an upper endoscopy in our records. Denies ongoing melanoma but will need EGD to evaluate. GI consulted. Will start clears, no EGD today.  CBGs running low. Will d/c lantus and give SSI.  Crista Curborinna Cataleah Stites, MD [facility] [email or web address] password [email or web address] [phone]

## 2015-04-19 NOTE — Progress Notes (Signed)
Hypoglycemic Event  CBG: 45  Treatment: 15 GM carbohydrate snack  Symptoms: None  Follow-up CBG: Time:0853 CBG Result: 64  Possible Reasons for Event: Other: NPO  Comments/MD notified: MD Lendell CapriceSullivan notified, and aware we are giving him PO carb supplementation. Will continue PO carb supplementation, and notify MD Lendell CapriceSullivan with result.    Leane PlattScanlon, [name]  Remember to initiate Hypoglycemia Order Set & complete

## 2015-04-20 DIAGNOSIS — E039 Hypothyroidism, unspecified: Secondary | ICD-10-CM

## 2015-04-20 LAB — CBC
HEMATOCRIT: 25.9 % — AB (ref 39.0–52.0)
HEMOGLOBIN: 8.3 g/dL — AB (ref 13.0–17.0)
MCH: 28.2 pg (ref 26.0–34.0)
MCHC: 32 g/dL (ref 30.0–36.0)
MCV: 88.1 fL (ref 78.0–100.0)
Platelets: 129 10*3/uL — ABNORMAL LOW (ref 150–400)
RBC: 2.94 MIL/uL — ABNORMAL LOW (ref 4.22–5.81)
RDW: 17 % — ABNORMAL HIGH (ref 11.5–15.5)
WBC: 2.4 10*3/uL — ABNORMAL LOW (ref 4.0–10.5)

## 2015-04-20 LAB — TYPE AND SCREEN
ABO/RH(D): O POS
Antibody Screen: NEGATIVE
UNIT DIVISION: 0
Unit division: 0

## 2015-04-20 LAB — GLUCOSE, CAPILLARY
GLUCOSE-CAPILLARY: 145 mg/dL — AB (ref 70–99)
GLUCOSE-CAPILLARY: 147 mg/dL — AB (ref 70–99)
GLUCOSE-CAPILLARY: 82 mg/dL (ref 70–99)
GLUCOSE-CAPILLARY: 83 mg/dL (ref 70–99)
Glucose-Capillary: 148 mg/dL — ABNORMAL HIGH (ref 70–99)

## 2015-04-20 NOTE — Progress Notes (Signed)
GASTROENTEROLOGY PROGRESS NOTE  Problem:   Heme positive stool. Drop in hemoglobin. Chronic liver disease with ascites.  Subjective: Feels great.  Objective: Appropriate posttransfusion rise in hemoglobin.  Assessment: Anemia is probably multifactorial, but the 1.5 g drop in the past 6 weeks, associated also with heme positivity, suggests the possibility of a transient or low-grade GI bleed as being at least partly responsible.  Plan: Endoscopy tomorrow morning. The patient is familiar with the procedure from prior exams, most recently a year and a half ago when he had small varices. We will see if they have enlarged, or if he has developed significant portal gastropathy, in the interim. This could account for the recent drop in hemoglobin.  Depending what is found at endoscopy, I would strongly consider doing a video capsule small bowel endoscopy the next day, while the patient is in-house, since he has a history of recurring anemia and heme positivity and endoscopy and colonoscopy have been unrevealing for a source in the past. Given that this patient resides at a skilled nursing facility, it would be much easier to accomplish this as an inpatient, and it would complete his GI workup.  Karenann Mcgrory V. Chayla Shands, M.D. 04/20/2015 4:01 PM  Pager [phone] If no answer or after 5 PM call [phone] 

## 2015-04-20 NOTE — Progress Notes (Signed)
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 Wynonia MustyShaddad's office note for more info on the chronic component). GI plans for EGD/camera endoscopy s/p 2 units PRBC PT/INR elevated  DM2 - Reduce home lantus dose to 20 BID from 30 BID currently SSI  Hypothyroidism - stable on synthroid  Anxiety  Recent hip fx  Cirrhosis -not on any lasix/spironolactone -defer to GI  Code Status: DNR Family Communication: patient Disposition Plan:    Consultants:  GI  Procedures:      HPI/Subjective: No CP no SOB  Objective: Filed Vitals:   04/20/15 0555  BP: 133/61  Pulse: 79  Temp: 97.8 F (36.6 C)  Resp: 18    Intake/Output Summary (Last 24 hours) at 04/20/15 0732 Last data filed at 04/20/15 0140  Gross per 24 hour  Intake   2390 ml  Output   1000 ml  Net   1390 ml   Filed Weights   04/19/15 0225 04/19/15 0619  Weight: 75.297 kg (166 lb) 75.297 kg (166 lb)    Exam:   General:  Pleasant/cooeprative  Cardiovascular: rrr  Respiratory: clear  Abdomen: +BS, distended but soft  Musculoskeletal: + LE edema   Data Reviewed: Basic Metabolic Panel:  Recent Labs Lab 04/19/15 0235  NA 139  K 4.2  CL 108  CO2 23  GLUCOSE 69*  BUN 27*  CREATININE 1.07  CALCIUM 8.2*   Liver Function Tests:  Recent Labs Lab 04/19/15 0235  AST 71*  ALT 30  ALKPHOS 297*  BILITOT 0.9  PROT 6.9  ALBUMIN 1.8*   No results for input(s): LIPASE, AMYLASE in the last 168 hours. No results for input(s): AMMONIA in the last 168 hours. CBC:  Recent Labs Lab 04/19/15 0235 04/19/15 1151 04/20/15 0345  WBC 2.1*  --  2.4*  HGB 6.5* 6.0* 8.3*  HCT 20.6* 19.6* 25.9*  MCV 90.4  --  88.1  PLT 141*  --  129*   Cardiac Enzymes: No results for input(s): CKTOTAL, CKMB, CKMBINDEX,  TROPONINI in the last 168 hours. BNP (last 3 results) No results for input(s): BNP in the last 8760 hours.  ProBNP (last 3 results) No results for input(s): PROBNP in the last 8760 hours.  CBG:  Recent Labs Lab 04/19/15 0853 04/19/15 0913 04/19/15 1241 04/19/15 1720 04/20/15 0036  GLUCAP 64* 107* 136* 87 83    Recent Results (from the past 240 hour(s))  MRSA PCR Screening     Status: None   Collection Time: 04/19/15  6:46 AM  Result Value Ref Range Status   MRSA by PCR NEGATIVE NEGATIVE Final    Comment:        The GeneXpert MRSA Assay (FDA approved for NASAL specimens only), is one component of a comprehensive MRSA colonization surveillance program. It is not intended to diagnose MRSA infection nor to guide or monitor treatment for MRSA infections.      Studies: No results found.  Scheduled Meds: . sodium chloride   Intravenous Once  . feeding supplement (PRO-STAT SUGAR FREE 64)  30 mL Oral Daily  . FLUoxetine  60 mg Oral Daily  . insulin aspart  0-9 Units Subcutaneous TID WC  . lamoTRIgine  50 mg Oral BID  . levothyroxine  25 mcg Oral QAC breakfast  . [START ON 04/22/2015] pantoprazole (PROTONIX) IV  40 mg Intravenous Q12H  . sodium chloride  3 mL Intravenous Q12H  . tamsulosin  0.4 mg Oral Daily  . traZODone  75 mg Oral QHS   Continuous Infusions: . pantoprozole (PROTONIX) infusion 8 mg/hr (04/19/15 1630)   Antibiotics Given (last 72 hours)    None      Principal Problem:   Acute blood loss anemia Active Problems:   DM (diabetes mellitus), type 2 with complications   Liver cirrhosis   GI bleed    Time spent: 25 min    VANN, JESSICA  [facility] Pager [phone]. If 7PM-7AM, please contact night-coverage at [email or web address], password [email or web address] 04/20/2015, 7:32 AM  LOS: 1 day

## 2015-04-20 NOTE — Evaluation (Signed)
Physical Therapy Evaluation Patient Details Name: [name] NakaiMichael L Vink MRN: [id number] DOB: 09/20/1946 Today's Date: 04/20/2015   History of Present Illness  69 y.o. male [facility] from SNF due to anemia possible GIB.  Recent admission 03/24/15 for right hip fx and ORIF.  Pt with significant PMHx of DM2, depression, anemia, anxiety, HTN, and IM Nail left femur 12/2014.  Also with subjective report of vertigo.   Clinical Impression  Patient presents with decreased mobility due to deficits listed in PT problem list below.  He will benefit from skilled PT in the acute setting to allow decreased burden of care in next venue.  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  May contribute to recent falls in addition to increased fear of falling.  Need maximal mobilization to allow habituation and improved tolerance/confidence to upright.    Follow Up Recommendations SNF    Equipment Recommendations  None recommended by PT    Recommendations for Other Services       Precautions / Restrictions Precautions Precautions: Fall Restrictions Weight Bearing Restrictions: Yes RLE Weight Bearing: Weight bearing as tolerated      Mobility  Bed Mobility Overal bed mobility: Needs Assistance Bed Mobility: Supine to Sit;Sit to Supine     Supine to sit: Mod assist Sit to supine: Mod assist   General bed mobility comments: assist to lift trunk with rail, then assist for legs into bed to supine  Transfers Overall transfer level: Needs assistance Equipment used: Rolling walker (2 wheeled) Transfers: Sit to/from Stand Sit to Stand: Mod assist         General transfer comment: lifting assist x 2 trials   Ambulation/Gait Ambulation/Gait assistance: Min assist;Mod assist Ambulation Distance (Feet): 3 Feet (and 3' backward) Assistive device: Rolling walker (2 wheeled) Gait Pattern/deviations: Step-to pattern;Shuffle     General Gait Details: c/o dizziness and  noted stiffness and posterior bias with mild increase in anxiety (fearful of falling)  Stairs            Wheelchair Mobility    Modified Rankin (Stroke Patients Only)       Balance Overall balance assessment: History of Falls;Needs assistance       Postural control: Posterior lean Standing balance support: Bilateral upper extremity supported Standing balance-Leahy Scale: Poor Standing balance comment: needs assist for balance with bilateral UE support                             Pertinent Vitals/Pain Pain Assessment: 0-10 Pain Score: 4  Pain Location: right hip (states had pain meds recently) Pain Intervention(s): Monitored during session    Home Living Family/patient expects to be discharged to:: Skilled nursing facility                      Prior Function Level of Independence: Needs assistance   Gait / Transfers Assistance Needed: ambulated with RW and assist at SNF for rehab           Hand Dominance        Extremity/Trunk Assessment               Lower Extremity Assessment: RLE deficits/detail RLE Deficits / Details: AAROM grossly WFL, strength limited due to pain       Communication   Communication: No difficulties  Cognition Arousal/Alertness: Awake/alert Behavior During Therapy: WFL for tasks assessed/performed Overall Cognitive Status: History of cognitive impairments - at baseline  General Comments      Exercises        Assessment/Plan    PT Assessment Patient needs continued PT services  PT Diagnosis Abnormality of gait;Generalized weakness   PT Problem List Decreased strength;Decreased mobility;Decreased balance;Decreased knowledge of use of DME;Decreased activity tolerance;Decreased range of motion  PT Treatment Interventions DME instruction;Therapeutic exercise;Gait training;Balance training;Functional mobility training;Therapeutic activities;Patient/family education   PT  Goals (Current goals can be found in the Care Plan section) Acute Rehab PT Goals Patient Stated Goal: To return to rehab PT Goal Formulation: With patient Time For Goal Achievement: 05/04/15 Potential to Achieve Goals: Good    Frequency Min 2X/week   Barriers to discharge        Co-evaluation               End of Session Equipment Utilized During Treatment: Gait belt Activity Tolerance: Patient limited by fatigue Patient left: in bed;with bed alarm set           Time: [id number] PT Time Calculation (min) (ACUTE ONLY): 27 min   Charges:   PT Evaluation $Initial PT Evaluation Tier I: 1 Procedure PT Treatments $Gait Training: 8-22 mins   PT G Codes:        Sagrario Lineberry,CYNDI 04/20/2015, 2:36 PM  Sheran Lawlessyndi Keyunna Coco, PT [phone] 04/20/2015

## 2015-04-20 NOTE — Progress Notes (Signed)
MD Benjamine MolaVann paged, patient heart rhythm on ECG strip has changed from NSR yesterday to 1st degree heart block (PR interval .22) today. Will continue to monitor patient.

## 2015-04-20 NOTE — Progress Notes (Signed)
Second unit PRBCs completed.  Patient tolerated well. VSS.  Will continue to monitor.

## 2015-04-20 NOTE — Progress Notes (Signed)
CBC showing in computer for 2345. No other orders placed. Will await results.

## 2015-04-20 NOTE — Progress Notes (Signed)
No results showing for post transfusion CBC. Lab called. Will redraw. Will continue to monitor.

## 2015-04-21 ENCOUNTER — Encounter (HOSPITAL_COMMUNITY)
Admission: EM | Disposition: A | Discharge status: Skilled Nursing Facility | Payer: Self-pay | Source: Home / Self Care | Attending: Internal Medicine

## 2015-04-21 ENCOUNTER — Encounter (HOSPITAL_COMMUNITY): Payer: Self-pay | Admitting: Certified Registered Nurse Anesthetist

## 2015-04-21 ENCOUNTER — Inpatient Hospital Stay (HOSPITAL_COMMUNITY): Payer: Medicare Other | Admitting: Anesthesiology

## 2015-04-21 HISTORY — PX: GIVENS CAPSULE STUDY: SHX5432

## 2015-04-21 HISTORY — PX: ESOPHAGOGASTRODUODENOSCOPY: SHX5428

## 2015-04-21 LAB — CBC
HCT: 26.8 % — ABNORMAL LOW (ref 39.0–52.0)
Hemoglobin: 8.4 g/dL — ABNORMAL LOW (ref 13.0–17.0)
MCH: 27.7 pg (ref 26.0–34.0)
MCHC: 31.3 g/dL (ref 30.0–36.0)
MCV: 88.4 fL (ref 78.0–100.0)
PLATELETS: 125 10*3/uL — AB (ref 150–400)
RBC: 3.03 MIL/uL — AB (ref 4.22–5.81)
RDW: 17.1 % — ABNORMAL HIGH (ref 11.5–15.5)
WBC: 2.4 10*3/uL — ABNORMAL LOW (ref 4.0–10.5)

## 2015-04-21 LAB — BASIC METABOLIC PANEL
Anion gap: 6 (ref 5–15)
BUN: 16 mg/dL (ref 6–20)
CHLORIDE: 106 mmol/L (ref 101–111)
CO2: 24 mmol/L (ref 22–32)
Calcium: 8.1 mg/dL — ABNORMAL LOW (ref 8.9–10.3)
Creatinine, Ser: 0.97 mg/dL (ref 0.61–1.24)
GFR calc Af Amer: >60 mL/min (ref >60)
GFR calc non Af Amer: >60 mL/min (ref >60)
Glucose, Bld: 100 mg/dL — ABNORMAL HIGH (ref 70–99)
POTASSIUM: 3.8 mmol/L (ref 3.5–5.1)
Sodium: 136 mmol/L (ref 135–145)

## 2015-04-21 LAB — GLUCOSE, CAPILLARY
Glucose-Capillary: 44 mg/dL — CL (ref 70–99)
Glucose-Capillary: 103 mg/dL — ABNORMAL HIGH (ref 70–99)
Glucose-Capillary: 102 mg/dL — ABNORMAL HIGH (ref 70–99)
Glucose-Capillary: 97 mg/dL (ref 70–99)
Glucose-Capillary: 150 mg/dL — ABNORMAL HIGH (ref 70–99)

## 2015-04-21 SURGERY — IMAGING PROCEDURE, GI TRACT, INTRALUMINAL, VIA CAPSULE
Anesthesia: LOCAL

## 2015-04-21 SURGERY — EGD (ESOPHAGOGASTRODUODENOSCOPY)
Anesthesia: Monitor Anesthesia Care

## 2015-04-21 MED ORDER — PNEUMOCOCCAL VAC POLYVALENT 25 MCG/0.5ML IJ INJ
0.5000 mL | INJECTION | INTRAMUSCULAR | Status: AC
  Administered 2015-04-22: 0.5 mL via INTRAMUSCULAR
  Filled 2015-04-21: qty 0.5

## 2015-04-21 MED ORDER — PROPOFOL INFUSION 10 MG/ML OPTIME
INTRAVENOUS | Status: DC | PRN
  Administered 2015-04-21: 50 ug/kg/min via INTRAVENOUS

## 2015-04-21 MED ORDER — SODIUM CHLORIDE 0.9 % IV SOLN
10.0000 mg | INTRAVENOUS | Status: DC | PRN
  Administered 2015-04-21: 10 ug/min via INTRAVENOUS

## 2015-04-21 MED ORDER — MIDAZOLAM HCL 5 MG/5ML IJ SOLN: INTRAMUSCULAR | Status: DC | PRN

## 2015-04-21 MED ORDER — HYDROMORPHONE HCL 1 MG/ML IJ SOLN: 0.2500 mg | INTRAMUSCULAR | Status: DC | PRN

## 2015-04-21 MED ORDER — LACTATED RINGERS IR SOLN: 1000.0000 mL | Freq: Once | Status: DC

## 2015-04-21 MED ORDER — BUTAMBEN-TETRACAINE-BENZOCAINE 2-2-14 % EX AERO
INHALATION_SPRAY | CUTANEOUS | Status: DC | PRN
  Administered 2015-04-21: 2 via TOPICAL

## 2015-04-21 MED ORDER — SODIUM CHLORIDE 0.9 % IV SOLN: INTRAVENOUS | Status: DC

## 2015-04-21 MED ORDER — PROMETHAZINE HCL 25 MG/ML IJ SOLN: 6.2500 mg | INTRAMUSCULAR | Status: DC | PRN

## 2015-04-21 MED ORDER — LACTATED RINGERS IV SOLN
INTRAVENOUS | Status: DC
Start: 2015-04-21 — End: 2015-04-21
  Administered 2015-04-21: 500 mL via INTRAVENOUS

## 2015-04-21 SURGICAL SUPPLY — 1 items: TOWEL COTTON PACK 4EA (MISCELLANEOUS) ×4 IMPLANT

## 2015-04-21 NOTE — H&P (View-Only) (Signed)
GASTROENTEROLOGY PROGRESS NOTE  Problem:   Heme positive stool. Drop in hemoglobin. Chronic liver disease with ascites.  Subjective: Feels great.  Objective: Appropriate posttransfusion rise in hemoglobin.  Assessment: Anemia is probably multifactorial, but the 1.5 g drop in the past 6 weeks, associated also with heme positivity, suggests the possibility of a transient or low-grade GI bleed as being at least partly responsible.  Plan: Endoscopy tomorrow morning. The patient is familiar with the procedure from prior exams, most recently a year and a half ago when he had small varices. We will see if they have enlarged, or if he has developed significant portal gastropathy, in the interim. This could account for the recent drop in hemoglobin.  Depending what is found at endoscopy, I would strongly consider doing a video capsule small bowel endoscopy the next day, while the patient is in-house, since he has a history of recurring anemia and heme positivity and endoscopy and colonoscopy have been unrevealing for a source in the past. Given that this patient resides at a skilled nursing facility, it would be much easier to accomplish this as an inpatient, and it would complete his GI workup.  Florencia Reasonsobert V. Jaciel Diem, M.D. 04/20/2015 4:01 PM  Pager [phone] If no answer or after 5 PM call [phone]

## 2015-04-21 NOTE — Progress Notes (Signed)
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 GI plans for EGD/camera endoscopy s/p 2 units PRBC PT/INR elevated  DM2 - Reduced home lantus dose to 20 BID from 30 BID currently SSI  Hypothyroidism - stable on synthroid  Anxiety  Recent hip fx  Cirrhosis -not on any lasix/spironolactone -defer to GI to begin treatment?  Code Status: DNR Family Communication: patient/[name] (sister) phone on 5/9 Disposition Plan:    Consultants:  GI  Procedures:      HPI/Subjective: No CP no SOB Ready for EGD  Objective: Filed Vitals:   04/21/15 1111  BP: 143/55  Pulse:   Temp: 98.6 F (37 C)  Resp: 15    Intake/Output Summary (Last 24 hours) at 04/21/15 1119 Last data filed at 04/21/15 1027  Gross per 24 hour  Intake    420 ml  Output   1850 ml  Net  -1430 ml   Filed Weights   04/19/15 0225 04/19/15 0619  Weight: 75.297 kg (166 lb) 75.297 kg (166 lb)    Exam:   General:  Pleasant/cooeprative  Cardiovascular: rrr  Respiratory: clear  Abdomen: +BS, distended but soft  Musculoskeletal: + LE edema   Data Reviewed: Basic Metabolic Panel:  Recent Labs Lab 04/19/15 0235 04/21/15 0516  NA 139 136  K 4.2 3.8  CL 108 106  CO2 23 24  GLUCOSE 69* 100*  BUN 27* 16  CREATININE 1.07 0.97  CALCIUM 8.2* 8.1*   Liver Function Tests:  Recent Labs Lab 04/19/15 0235  AST 71*  ALT 30  ALKPHOS 297*  BILITOT 0.9  PROT 6.9  ALBUMIN 1.8*   No results for input(s): LIPASE, AMYLASE in the last 168 hours. No results for input(s): AMMONIA in the last 168 hours. CBC:  Recent Labs Lab 04/19/15 0235 04/19/15 1151 04/20/15 0345 04/21/15 0516  WBC 2.1*  --  2.4* 2.4*  HGB 6.5* 6.0* 8.3* 8.4*    HCT 20.6* 19.6* 25.9* 26.8*  MCV 90.4  --  88.1 88.4  PLT 141*  --  129* 125*   Cardiac Enzymes: No results for input(s): CKTOTAL, CKMB, CKMBINDEX, TROPONINI in the last 168 hours. BNP (last 3 results) No results for input(s): BNP in the last 8760 hours.  ProBNP (last 3 results) No results for input(s): PROBNP in the last 8760 hours.  CBG:  Recent Labs Lab 04/20/15 0834 04/20/15 1232 04/20/15 1730 04/20/15 2151 04/21/15 0742  GLUCAP 82 148* 145* 147* 103*    Recent Results (from the past 240 hour(s))  MRSA PCR Screening     Status: None   Collection Time: 04/19/15  6:46 AM  Result Value Ref Range Status   MRSA by PCR NEGATIVE NEGATIVE Final    Comment:        The GeneXpert MRSA Assay (FDA approved for NASAL specimens only), is one component of a comprehensive MRSA colonization surveillance program. It is not intended to diagnose MRSA infection nor to guide or monitor treatment for MRSA infections.      Studies: No results found.  Scheduled Meds: . sodium chloride   Intravenous Once  . feeding supplement (PRO-STAT SUGAR FREE 64)  30 mL Oral Daily  . FLUoxetine  60 mg Oral Daily  . insulin aspart  0-9 Units Subcutaneous  TID WC  . lamoTRIgine  50 mg Oral BID  . levothyroxine  25 mcg Oral QAC breakfast  . [START ON 04/22/2015] pantoprazole (PROTONIX) IV  40 mg Intravenous Q12H  . sodium chloride  3 mL Intravenous Q12H  . tamsulosin  0.4 mg Oral Daily  . traZODone  75 mg Oral QHS   Continuous Infusions: . sodium chloride    . pantoprozole (PROTONIX) infusion 8 mg/hr (04/21/15 0429)   Antibiotics Given (last 72 hours)    None      Principal Problem:   Acute blood loss anemia Active Problems:   DM (diabetes mellitus), type 2 with complications   Liver cirrhosis   GI bleed    Time spent: 25 min    [name]  [facility] Pager [phone]. If 7PM-7AM, please contact night-coverage at [email or web address], password [email or web address] 04/21/2015, 11:19 AM   LOS: 2 days

## 2015-04-21 NOTE — Interval H&P Note (Signed)
History and Physical Interval Note:  04/21/2015 11:54 AM  [name] [name]  has presented today for surgery, with the diagnosis of heme positive; anemia  The various methods of treatment have been discussed with the patient and family. After consideration of risks, benefits and other options for treatment, the patient has consented to  Procedure(s): ESOPHAGOGASTRODUODENOSCOPY (EGD) (N/A) as a surgical intervention .  The patient's history has been reviewed, patient examined, no change in status, stable for surgery.  I have reviewed the patient's chart and labs.  Questions were answered to the patient's satisfaction.     [name] M  Assessment:  1.  Anemia. 2.  Hemoccult positive stool  Plan:  1.  Endoscopy. 2.  If endoscopy is unrevealing, consider capsule endoscopy. 3.  Risks (bleeding, infection, bowel perforation that could require surgery, sedation-related changes in cardiopulmonary systems), benefits (identification and possible treatment of source of symptoms, exclusion of certain causes of symptoms), and alternatives (watchful waiting, radiographic imaging studies, empiric medical treatment) of upper endoscopy (EGD) were explained to patient/family in detail and patient wishes to proceed.

## 2015-04-21 NOTE — Transfer of Care (Signed)
Immediate Anesthesia Transfer of Care Note  Patient: [name] NakaiMichael L [name]  Procedure(s) Performed: Procedure(s): ESOPHAGOGASTRODUODENOSCOPY (EGD) (N/A)  Patient Location: Endoscopy Unit  Anesthesia Type:MAC  Level of Consciousness: awake, alert , oriented and patient cooperative  Airway & Oxygen Therapy: Patient Spontanous Breathing and Patient connected to nasal cannula oxygen  Post-op Assessment: Report given to RN and Post -op Vital signs reviewed and stable  Post vital signs: Reviewed and stable  Last Vitals:  Filed Vitals:   04/21/15 1230  BP: 116/45  Pulse: 73  Temp:   Resp: 13    Complications: No apparent anesthesia complications

## 2015-04-21 NOTE — Anesthesia Postprocedure Evaluation (Signed)
Anesthesia Post Note  Patient: [name]  Procedure(s) Performed: Procedure(s) (LRB): ESOPHAGOGASTRODUODENOSCOPY (EGD) (N/A)  Anesthesia type: MAC  Patient location: PACU  Post pain: Pain level controlled  Post assessment: Patient's Cardiovascular Status Stable  Last Vitals:  Filed Vitals:   04/21/15 1230  BP: 116/45  Pulse: 73  Temp:   Resp: 13    Post vital signs: Reviewed and stable  Level of consciousness: sedated  Complications: No apparent anesthesia complications

## 2015-04-21 NOTE — Anesthesia Preprocedure Evaluation (Addendum)
Anesthesia Evaluation  Patient identified by MRN, date of birth, ID band Patient awake    Reviewed: Allergy & Precautions, NPO status , Patient's Chart, lab work & pertinent test results  History of Anesthesia Complications Negative for: history of anesthetic complications  Airway Mallampati: II  TM Distance: >3 FB Neck ROM: Full    Dental  (+) Edentulous Upper, Poor Dentition, Dental Advisory Given   Pulmonary former smoker,    Pulmonary exam normal       Cardiovascular hypertension, Normal cardiovascular exam    Neuro/Psych PSYCHIATRIC DISORDERS Anxiety Depression negative neurological ROS     GI/Hepatic Neg liver ROS, GERD-  Medicated,  Endo/Other  diabetesHypothyroidism   Renal/GU Renal InsufficiencyRenal disease     Musculoskeletal   Abdominal   Peds  Hematology   Anesthesia Other Findings   Reproductive/Obstetrics                            Anesthesia Physical Anesthesia Plan  ASA: III  Anesthesia Plan: MAC   Post-op Pain Management:    Induction: Intravenous  Airway Management Planned: Simple Face Mask  Additional Equipment:   Intra-op Plan:   Post-operative Plan:   Informed Consent: I have reviewed the patients History and Physical, chart, labs and discussed the procedure including the risks, benefits and alternatives for the proposed anesthesia with the patient or authorized representative who has indicated his/her understanding and acceptance.   Dental advisory given  Plan Discussed with: CRNA, Anesthesiologist and Surgeon  Anesthesia Plan Comments:        Anesthesia Quick Evaluation

## 2015-04-21 NOTE — Op Note (Signed)
[name], [name], [name]  MR#: [id number] BIRTHDATE: May 12, 1946 , 68  yrs. old GENDER: male ENDOSCOPIST: Willis ModenaWilliam Buffi Ewton, MD REFERRED BY:  [facility] PROCEDURE DATE:  04/21/2015 PROCEDURE:  EGD, diagnostic ASA CLASS:     Class III INDICATIONS:  anemia, hemoccult-positive stool. MEDICATIONS: Per Anesthesia and Monitored anesthesia care TOPICAL ANESTHETIC: Cetacaine Spray  DESCRIPTION OF PROCEDURE: After the risks benefits and alternatives of the procedure were thoroughly explained, informed consent was obtained.  The PENTAX GASTROSCOPE [id number] endoscope was introduced through the mouth and advanced to the second portion of the duodenum. The instrument was slowly withdrawn as the mucosa was fully examined. Estimated blood loss is zero unless otherwise noted in this procedure report.    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  Nodular antral gastritis.  Erosion in duodenal bulb without overt bleeding.  Couple punctate red regions in proximal duodenum, scope trauma versus AVMs.  No active bleeding noted. Otherwise normal exam to second portion of the duodenum. Retroflexion into cardia showed no evidence of gastric varices.          The scope was then withdrawn from the patient and the procedure completed.  COMPLICATIONS: There were no immediate complications.  ENDOSCOPIC IMPRESSION:     As above.  Several small findings, but no clear-cut source of hemoccult-positive anemia was identified.   RECOMMENDATIONS:     1.  Watch for potential complications of procedure. 2.  Capsule endoscopy for further evaluation. 3.  [facility] will follow.  eSigned:  Willis ModenaWilliam Coulton Schlink, MD 04/21/2015 12:23 PM   CC:  CPT CODES: ICD CODES:  The ICD and CPT codes recommended by this software are interpretations from the data that the clinical  staff has captured with the software.  The verification of the translation of this report to the ICD and CPT codes and modifiers is the sole responsibility of the health care institution and practicing physician where this report was generated.  [employer] will not be held responsible for the validity of the ICD and CPT codes included on this report.  AMA assumes no liability for data contained or not contained herein. CPT is a Publishing rights managerregistered trademark of the [employer].

## 2015-04-21 NOTE — Consult Note (Addendum)
WOC wound consult note Reason for Consult: Pt states he has problems to his buttocks/sacrum area several times in the past.  Wound type: 3 areas of full thickness wounds. There are NOT a pressure ulcers. pt states he had "some boils " prior to admission which spontaneously drained and are improving.  Measurement: 2X2X.1c, .5X.5X.1cm, .5X.5X.1cm Wound bed: moist yellow woundbeds Drainage (amount, consistency, odor) Small amt yellow drainage, no odor Periwound: Intact skin surrounding Dressing procedure/placement/frequency: Foam dressing to protect and promote healing. Please re-consult if further assistance is needed.  Thank-you,  Cammie Mcgeeawn Tayna Smethurst MSN, RN, CWOCN, [place]-AP, CNS [phone]

## 2015-04-22 ENCOUNTER — Encounter (HOSPITAL_COMMUNITY): Payer: Self-pay | Admitting: Gastroenterology

## 2015-04-22 DIAGNOSIS — K922 Gastrointestinal hemorrhage, unspecified: Secondary | ICD-10-CM | POA: Diagnosis not present

## 2015-04-22 LAB — COMPREHENSIVE METABOLIC PANEL
ALT: 24 U/L (ref 17–63)
ANION GAP: 7 (ref 5–15)
AST: 57 U/L — ABNORMAL HIGH (ref 15–41)
Albumin: 1.7 g/dL — ABNORMAL LOW (ref 3.5–5.0)
Alkaline Phosphatase: 262 U/L — ABNORMAL HIGH (ref 38–126)
BUN: 18 mg/dL (ref 6–20)
CALCIUM: 8.1 mg/dL — AB (ref 8.9–10.3)
CO2: 23 mmol/L (ref 22–32)
Chloride: 107 mmol/L (ref 101–111)
Creatinine, Ser: 1.07 mg/dL (ref 0.61–1.24)
GFR calc Af Amer: >60 mL/min (ref >60)
GFR calc non Af Amer: >60 mL/min (ref >60)
GLUCOSE: 147 mg/dL — AB (ref 70–99)
Potassium: 3.9 mmol/L (ref 3.5–5.1)
SODIUM: 137 mmol/L (ref 135–145)
TOTAL PROTEIN: 6.5 g/dL (ref 6.5–8.1)
Total Bilirubin: 1.4 mg/dL — ABNORMAL HIGH (ref 0.3–1.2)

## 2015-04-22 LAB — CBC
HEMATOCRIT: 25.1 % — AB (ref 39.0–52.0)
Hemoglobin: 8.1 g/dL — ABNORMAL LOW (ref 13.0–17.0)
MCH: 28.6 pg (ref 26.0–34.0)
MCHC: 32.3 g/dL (ref 30.0–36.0)
MCV: 88.7 fL (ref 78.0–100.0)
Platelets: 105 10*3/uL — ABNORMAL LOW (ref 150–400)
RBC: 2.83 MIL/uL — ABNORMAL LOW (ref 4.22–5.81)
RDW: 16.8 % — AB (ref 11.5–15.5)
WBC: 2.4 10*3/uL — ABNORMAL LOW (ref 4.0–10.5)

## 2015-04-22 LAB — GLUCOSE, CAPILLARY
GLUCOSE-CAPILLARY: 129 mg/dL — AB (ref 70–99)
GLUCOSE-CAPILLARY: 149 mg/dL — AB (ref 70–99)

## 2015-04-22 LAB — AMMONIA: AMMONIA: 71 umol/L — AB (ref 9–35)

## 2015-04-22 MED ORDER — INSULIN GLARGINE 100 UNIT/ML ~~LOC~~ SOLN: 20.0000 [IU] | Freq: Every day | SUBCUTANEOUS | Status: DC

## 2015-04-22 MED ORDER — HYDROCODONE-ACETAMINOPHEN 5-325 MG PO TABS: 1.0000 | ORAL_TABLET | Freq: Four times a day (QID) | ORAL | Status: DC | PRN

## 2015-04-22 MED ORDER — PANTOPRAZOLE SODIUM 40 MG PO TBEC: 40.0000 mg | DELAYED_RELEASE_TABLET | Freq: Two times a day (BID) | ORAL | Status: DC

## 2015-04-22 MED ORDER — HYDROCORTISONE 2.5 % RE CREA: TOPICAL_CREAM | Freq: Two times a day (BID) | RECTAL | Status: DC | PRN

## 2015-04-22 NOTE — Progress Notes (Signed)
Medicare Important Message given?  YES (If response is "NO", the following Medicare IM given date fields will be blank) Date Medicare IM given:  04/22/15 Medicare IM given by:  Raylyn Carton 

## 2015-04-22 NOTE — Progress Notes (Signed)
Updated sister [name] via telephone. All questions answered.

## 2015-04-22 NOTE — Progress Notes (Signed)
Physical Therapy Treatment Patient Details Name: [name] NakaiMichael L Szafran MRN: [id number] DOB: 01-Jan-1946 Today's Date: 04/22/2015    History of Present Illness 69 y.o. male [facility]/15 from SNF due to anemia possible GIB.  Recent admission 03/24/15 for right hip fx and ORIF.  Pt with significant PMHx of DM2, depression, anemia, anxiety, HTN, and IM Nail left femur 12/2014.  Also with subjective report of vertigo.     PT Comments    Patient progressing with ability to walk in hallway with chair following.  Limited by weakness and fear of falling.  Will benefit from SNF rehab at d/c.  Follow Up Recommendations  SNF     Equipment Recommendations  None recommended by PT    Recommendations for Other Services       Precautions / Restrictions Precautions Precautions: Fall Restrictions Weight Bearing Restrictions: No RLE Weight Bearing: Weight bearing as tolerated    Mobility  Bed Mobility Overal bed mobility: Needs Assistance Bed Mobility: Supine to Sit     Supine to sit: Mod assist     General bed mobility comments: lifting trunk upright with heavy assist  Transfers Overall transfer level: Needs assistance Equipment used: Standard walker Transfers: Sit to/from Stand Sit to Stand: Mod assist         General transfer comment: lifting assist from bed, then from chair  Ambulation/Gait Ambulation/Gait assistance: Min assist Ambulation Distance (Feet): 20 Feet (15) Assistive device: Rolling walker (2 wheeled) Gait Pattern/deviations: Step-through pattern;Trunk flexed;Decreased stride length;Antalgic     General Gait Details: able to walk distance with chair following, on seated rest due to pt c/o weakness; mildly antalgic on right   Stairs            Wheelchair Mobility    Modified Rankin (Stroke Patients Only)       Balance                                    Cognition Arousal/Alertness: Awake/alert Behavior During Therapy: WFL  for tasks assessed/performed Overall Cognitive Status: History of cognitive impairments - at baseline                      Exercises      General Comments        Pertinent Vitals/Pain Pain Score: 2  Pain Location: right hip Pain Intervention(s): Monitored during session    Home Living                      Prior Function            PT Goals (current goals can now be found in the care plan section) Progress towards PT goals: Progressing toward goals    Frequency  Min 2X/week    PT Plan Current plan remains appropriate    Co-evaluation             End of Session Equipment Utilized During Treatment: Gait belt Activity Tolerance: Patient tolerated treatment well Patient left: with call bell/phone within reach;with chair alarm set;in chair     Time: 1041-1105 PT Time Calculation (min) (ACUTE ONLY): 24 min  Charges:  $Gait Training: 23-37 mins                    G Codes:      WYNN,CYNDI 04/22/2015, 12:52 PM Sheran Lawlessyndi Wynn, PT [phone] 04/22/2015

## 2015-04-22 NOTE — Clinical Social Work Note (Signed)
Clinical Social Worker facilitated patient discharge including contacting patient family and facility to confirm patient discharge plans.  Clinical information faxed to facility and family agreeable with plan.  CSW arranged ambulance transport via [facility] to [facility].  RN to call report prior to discharge.  Clinical Social Worker will sign off for now as social work intervention is no longer needed. Please consult us again if new need arises.  Jesse Khrystyne Arpin, LCSW [phone] 

## 2015-04-22 NOTE — Care Management Note (Signed)
Case Management Note  Patient Details  Name: [name] MRN: [id number] Date of Birth: 30-Nov-1946  Subjective/Objective:                 Patient is for dc to snf  Today, CSW following.    Action/Plan:   Expected Discharge Date:                  Expected Discharge Plan:  Skilled Nursing Facility  In-House Referral:  Clinical Social Work  Discharge planning Services  CM Consult  Post Acute Care Choice:    Choice offered to:     DME Arranged:    DME Agency:     HH Arranged:    HH Agency:     Status of Service:     Medicare Important Message Given:  Yes Date Medicare IM Given:  04/22/15 Medicare IM give by:  Letha Capeeborah Kinisha Soper RN Date Additional Medicare IM Given:    Additional Medicare Important Message give by:     If discussed at Long Length of Stay Meetings, dates discussed:    Additional Comments:  Leone Havenaylor, Miara Emminger Clinton, RN 04/22/2015, 11:17 AM

## 2015-04-22 NOTE — Clinical Social Work Note (Signed)
Clinical Social Work Assessment  Patient Details  Name: [name] MRN: [id number] Date of Birth: 10-Apr-1946  Date of referral:  04/22/15               Reason for consult:  Facility Placement                Permission sought to share information with:  Facility Medical sales representativeContact Representative, Family Supports Permission granted to share information::     Name::     [name]  Relationship::  Sister  Contact Information:  [phone]  Housing/Transportation Living arrangements for the past 2 months:  Skilled Nursing Facility Source of Information:  Patient, Other (Comment Required) (Sibling) Patient Interpreter Needed:  None Criminal Activity/Legal Involvement Pertinent to Current Situation/Hospitalization:  No - Comment as needed Significant Relationships:  Siblings Lives with:  Facility Resident Do you feel safe going back to the place where you live?  Yes Need for family participation in patient care:  No (Coment) (Pt request)  Care giving concerns:  [facility]   Social Worker assessment / plan:  CSW spoke with pt at bedside who confirmed he was admitted from [facility] and would like to return. Pt aware of plan for discharge today and asked that CSW update his sister. CSW called [name] and notified of plan.  Pt will require non-emergent ambulance transport back to facility.   Insurance information:  [place] Recommendations:  Skilled Nursing Facility Information / Referral to community resources:   (None needed)    Patient/Family's Response to care:  Pt and pt family both expressed they are happy pt is discharging back to facility today.   Patient/Family's Understanding of and Emotional Response to Diagnosis, Current Treatment, and Prognosis:  Pt sister asked appropriate questions and demonstrated good understanding of pt condition.    Emotional Assessment Appearance:  Well-Groomed, Appears stated age Attitude/Demeanor/Rapport:  Other  (Cooperative) Affect (typically observed):  Appropriate, Happy Orientation:  Oriented to Self, Oriented to Place, Oriented to  Time, Oriented to Situation Alcohol / Substance use:  Not Applicable Psych involvement (Current and /or in the community):  No (Comment)  Discharge Needs  Concerns to be addressed:  Discharge Planning Concerns Readmission within the last 30 days:    Current discharge risk:  Dependent with Mobility Barriers to Discharge:  No Barriers Identified   [name], LCSWA [phone]

## 2015-04-22 NOTE — Discharge Summary (Signed)
Physician Discharge Summary  [name] NakaiMichael L Berryhill UJW:[id number] DOB: 08-19-1946 DOA: 04/19/2015  PCP: Margit HanksALEXANDER, [name], MD  Admit date: 04/19/2015 Discharge date: 04/22/2015  Time spent: 35 minutes  Recommendations for Outpatient Follow-up:  1. Cbc 2 weeks re Hgb 2. GI to follow up capsule endosocopy results  Discharge Diagnoses:  Principal Problem:   Acute blood loss anemia Active Problems:   DM (diabetes mellitus), type 2 with complications   Liver cirrhosis   GI bleed   Discharge Condition: improved  Diet recommendation: cardiac  Filed Weights   04/19/15 0225 04/19/15 0619  Weight: 75.297 kg (166 lb) 75.297 kg (166 lb)    History of present illness:  [name] is a 69 y.o. male with h/o cirrhosis of the liver, has broken both hips this year, just discharged from hospital after admission for hip fracture and repair. Patient presents to ED after his HGB was checked at the NH due to several days of black tarry stools. His HGB at the NH read low at 6.2.  Patient admits to constant, ongoing, periumbilical abdominal pain as well as dark colored stools for the past "couple of days". States that "Dr. Clelia CroftShadad" is his gastroenterologist (i think he ment to say Dr. Matthias HughsBuccini who has seen him previously per [facility], Dr. 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 Wynonia MustyShaddad's office note for more info on the chronic component). s/p 2 units PRBC EGD: Solitary Gr-II distal esophageal varix without stigmata of hemorrhage. Nodular antral gastritis. Erosion in duodenal bulb without overt bleeding. Couple punctate red regions in proximal duodenum, scope trauma versus AVMs. No active bleeding noted  DM2 - SSI lantus started back at a reduced dose  Hypothyroidism -  stable on synthroid  Anxiety  Recent hip fx  Cirrhosis - lasix/spironolactone   Procedures:  EGD  Capsule endoscopy  Consultations:  GI  Discharge Exam: Filed Vitals:   04/22/15 0616  BP: 127/55  Pulse: 73  Temp: 98.2 F (36.8 C)  Resp: 18    General: A+Ox3, NAD, feeling well  Discharge Instructions   Discharge Instructions    Diet - low sodium heart healthy    Complete by:  As directed      Diet Carb Modified    Complete by:  As directed      Discharge instructions    Complete by:  As directed   Cbc 2 week     Increase activity slowly    Complete by:  As directed           Current Discharge Medication List    START taking these medications   Details  hydrocortisone (ANUSOL-HC) 2.5 % rectal cream Place rectally 2 (two) times daily as needed for hemorrhoids. Qty: 30 g, Refills: 0    pantoprazole (PROTONIX) 40 MG tablet Take 1 tablet (40 mg total) by mouth 2 (two) times daily.      CONTINUE these medications which have CHANGED   Details  HYDROcodone-acetaminophen (NORCO/VICODIN) 5-325 MG per tablet Take 1-2 tablets by mouth every 6 (six) hours as needed for moderate pain. Qty: 10 tablet, Refills: 0    insulin glargine (LANTUS) 100 UNIT/ML injection Inject 0.2 mLs (20 Units total) into the skin at bedtime. Qty: 10 mL, Refills: 11      CONTINUE  these medications which have NOT CHANGED   Details  acetaminophen (TYLENOL) 500 MG tablet Take 1,000 mg by mouth every 4 (four) hours as needed.    Amino Acids-Protein Hydrolys (FEEDING SUPPLEMENT, PRO-STAT SUGAR FREE 64,) LIQD Take 30 mLs by mouth daily.    aspirin EC 325 MG EC tablet Take 1 tablet (325 mg total) by mouth daily with breakfast. Qty: 30 tablet, Refills: 0    bisacodyl (DULCOLAX) 10 MG suppository Place 10 mg rectally as needed for moderate constipation.    Cholecalciferol (VITAMIN D3) 1000 UNITS CAPS Take 1,000 Units by mouth every morning.     ferrous sulfate 325 (65 FE) MG tablet  Take 325 mg by mouth daily with breakfast.    FLUoxetine (PROZAC) 20 MG capsule Take 60 mg by mouth daily.    furosemide (LASIX) 20 MG tablet Take 20 mg by mouth daily.    lamoTRIgine (LAMICTAL) 25 MG tablet Take 50 mg by mouth 2 (two) times daily.    levothyroxine (SYNTHROID, LEVOTHROID) 25 MCG tablet Take 25 mcg by mouth daily.    magnesium hydroxide (MILK OF MAGNESIA) 400 MG/5ML suspension Take 30 mLs by mouth daily as needed for mild constipation.    meclizine (ANTIVERT) 25 MG tablet Take 25 mg by mouth daily.    Melatonin 10 MG TABS Take 10 mg by mouth at bedtime.    Multiple Vitamin (MULTIVITAMIN WITH MINERALS) TABS tablet Take 1 tablet by mouth daily.    phenylephrine-shark liver oil-mineral oil-petrolatum (PREPARATION H) 0.25-3-14-71.9 % rectal ointment Place 1 application rectally 2 (two) times daily as needed for hemorrhoids.    polyethylene glycol (MIRALAX / GLYCOLAX) packet Take 17 g by mouth daily as needed for mild constipation.     promethazine (PHENERGAN) 25 MG tablet Take 25 mg by mouth every 6 (six) hours as needed for nausea or vomiting.    simvastatin (ZOCOR) 40 MG tablet Take 40 mg by mouth at bedtime.     sodium chloride (OCEAN) 0.65 % SOLN nasal spray Place 1 spray into both nostrils 3 (three) times daily as needed (for nosebleed).    Sodium Phosphates (RA SALINE ENEMA RE) Place 1 each rectally as needed (for constipation).    spironolactone (ALDACTONE) 25 MG tablet Take 25 mg by mouth daily.    tamsulosin (FLOMAX) 0.4 MG CAPS capsule Take 0.4 mg by mouth daily.    Associated Diagnoses: Other pancytopenia    traZODone (DESYREL) 50 MG tablet Take 75 mg by mouth at bedtime.     UNABLE TO FIND Take 1 each by mouth 2 (two) times daily. Med Name: magic cup    glucose blood test strip 1 each by Other route 4 (four) times daily -  before meals and at bedtime. Use as instructed    insulin aspart (NOVOLOG) 100 UNIT/ML injection Inject 0-9 Units into the skin 3  (three) times daily with meals. Qty: 10 mL, Refills: 11      STOP taking these medications     methocarbamol (ROBAXIN) 500 MG tablet      omeprazole (PRILOSEC) 20 MG capsule        Allergies  Allergen Reactions  . Penicillins Hives   Follow-up Information    Follow up with Margit HanksALEXANDER, [name], MD In 1 week.   Specialty:  Internal Medicine   Contact information:   [address] [place] [address] [phone]        The results of significant diagnostics from this hospitalization (including imaging, microbiology, ancillary and  laboratory) are listed below for reference.    Significant Diagnostic Studies: Dg Chest 1 View  03/24/2015   CLINICAL DATA:  Status post fall 03/23/2015.  Right hip fracture.  EXAM: CHEST  1 VIEW  COMPARISON:  Single view of the chest 01/06/2015. PA and lateral chest 01/04/2014.  FINDINGS: Heart size is upper normal. The lungs are clear. No pneumothorax or pleural effusion.  IMPRESSION: No acute disease.   Electronically Signed   By: Drusilla Kanner M.D.   On: 03/24/2015 07:32   Dg Hip Operative Unilat With Pelvis Right  03/24/2015   CLINICAL DATA:  Right hip fixation  EXAM: OPERATIVE right HIP (WITH PELVIS IF PERFORMED)  VIEWS  TECHNIQUE: Fluoroscopic spot image(s) were submitted for interpretation post-operatively.  FLUOROSCOPY TIME:  Fluoroscopy time: 1 min 5 seconds  : COMPARISON:  Same date.  FINDINGS: Evidence of dynamic right femoral nail fixation of previously seen intertrochanteric hip fracture. Fracture fragments are in near anatomic alignment. No evidence for hardware failure.  IMPRESSION: Expected intraoperative appearance after right femoral ORIF.   Electronically Signed   By: Christiana Pellant M.D.   On: 03/24/2015 21:43   Dg Hip Unilat  With Pelvis 2-3 Views Right  03/24/2015   CLINICAL DATA:  Fall, right hip pain  EXAM: RIGHT HIP (WITH PELVIS) 2-3 VIEWS  COMPARISON:  Radiograph 01/07/2015  FINDINGS: Acute intertrochanteric fracture of the  proximal right femur. This avulsion of the lesser trochanter. Mild varus angulation. No dislocation. Internal fixation of left hip fracture noted  IMPRESSION: Acute right intertrochanteric femur fracture.   Electronically Signed   By: Genevive Bi M.D.   On: 03/24/2015 07:34    Microbiology: Recent Results (from the past 240 hour(s))  MRSA PCR Screening     Status: None   Collection Time: 04/19/15  6:46 AM  Result Value Ref Range Status   MRSA by PCR NEGATIVE NEGATIVE Final    Comment:        The GeneXpert MRSA Assay (FDA approved for NASAL specimens only), is one component of a comprehensive MRSA colonization surveillance program. It is not intended to diagnose MRSA infection nor to guide or monitor treatment for MRSA infections.      Labs: Basic Metabolic Panel:  Recent Labs Lab 04/19/15 0235 04/21/15 0516 04/22/15 0510  NA 139 136 137  K 4.2 3.8 3.9  CL 108 106 107  CO2 GLUCOSE 69* 100* 147*  BUN 27* 16 18  CREATININE 1.07 0.97 1.07  CALCIUM 8.2* 8.1* 8.1*   Liver Function Tests:  Recent Labs Lab 04/19/15 0235 04/22/15 0510  AST 71* 57*  ALT 30 24  ALKPHOS 297* 262*  BILITOT 0.9 1.4*  PROT 6.9 6.5  ALBUMIN 1.8* 1.7*   No results for input(s): LIPASE, AMYLASE in the last 168 hours.  Recent Labs Lab 04/22/15 0546  AMMONIA 71*   CBC:  Recent Labs Lab 04/19/15 0235 04/19/15 1151 04/20/15 0345 04/21/15 0516 04/22/15 0510  WBC 2.1*  --  2.4* 2.4* 2.4*  HGB 6.5* 6.0* 8.3* 8.4* 8.1*  HCT 20.6* 19.6* 25.9* 26.8* 25.1*  MCV 90.4  --  88.1 88.4 88.7  PLT 141*  --  129* 125* 105*   Cardiac Enzymes: No results for input(s): CKTOTAL, CKMB, CKMBINDEX, TROPONINI in the last 168 hours. BNP: BNP (last 3 results) No results for input(s): BNP in the last 8760 hours.  ProBNP (last 3 results) No results for input(s): PROBNP in the last 8760 hours.  CBG:  Recent Labs Lab 04/21/15 1441 04/21/15 1642 04/21/15 2108 04/22/15 0801  04/22/15 1157  GLUCAP 102* 97 150* 129* 149*       Signed:  Ellee Wawrzyniak  [facility] 04/22/2015, 1:48 PM

## 2015-04-22 NOTE — Progress Notes (Signed)
Awaiting capsule results, to be read this afternoon (images weren't downloaded during his morning rounds) or tomorrow morning by Dr. Bosie ClosSchooler.

## 2015-04-24 ENCOUNTER — Encounter: Payer: Self-pay | Admitting: Internal Medicine

## 2015-04-24 ENCOUNTER — Non-Acute Institutional Stay (SKILLED_NURSING_FACILITY): Payer: Medicare Other | Admitting: Internal Medicine

## 2015-04-24 DIAGNOSIS — E038 Other specified hypothyroidism: Secondary | ICD-10-CM

## 2015-04-24 DIAGNOSIS — E118 Type 2 diabetes mellitus with unspecified complications: Secondary | ICD-10-CM | POA: Diagnosis not present

## 2015-04-24 DIAGNOSIS — K7460 Unspecified cirrhosis of liver: Secondary | ICD-10-CM | POA: Diagnosis not present

## 2015-04-24 DIAGNOSIS — K921 Melena: Secondary | ICD-10-CM

## 2015-04-24 DIAGNOSIS — E034 Atrophy of thyroid (acquired): Secondary | ICD-10-CM

## 2015-04-24 DIAGNOSIS — R188 Other ascites: Secondary | ICD-10-CM

## 2015-04-24 NOTE — Progress Notes (Signed)
MRN: [id number] Name: CASHEL BELLINA  Sex: male Age: 69 y.o. DOB: 1946-06-19  [id number] Facility/Room:220 Level Of Care: SNF Provider: Merrilee Seashore D Emergency Contacts: Extended Emergency Contact Information Primary Emergency Contact: [name],[name] Address: [address]          [place], [place] Home Phone: [phone] Mobile Phone: [phone] Relation: Sister  Code Status: DNR  Allergies: Penicillins  Chief Complaint  Patient presents with  . New Admit To SNF    HPI: Patient is 69 y.o. male who was admitted to hospital for GI bleed and pancytopenia from liver cirrhosis who is now being re-admitted to SNF.  Past Medical History  Diagnosis Date  . Hyperglycemia   . Hyponatremia   . DMII (diabetes mellitus, type 2)   . Depression   . IBS (irritable bowel syndrome)   . Chest pain   . Unspecified gastritis and gastroduodenitis without mention of hemorrhage   . Arthropathy, unspecified, site unspecified   . History of colon polyps   . Anemia, unspecified   . Anxiety   . GERD (gastroesophageal reflux disease)   . Hyperlipidemia   . Hypertension     Past Surgical History  Procedure Laterality Date  . Hernia repair  1964  . Intramedullary (im) nail intertrochanteric Left 01/07/2015    Procedure: INTRAMEDULLARY (IM) NAIL INTERTROCHANTRIC;  Surgeon: Kathryne Hitch, MD;  Location: [facility] ORS;  Service: Orthopedics;  Laterality: Left;  . Femur im nail Right 03/24/2015    Procedure: CLOSED REDUCTION INTERNAL FIXATION;  Surgeon: Kerrin Champagne, MD;  Location: [facility] OR;  Service: Orthopedics;  Laterality: Right;  . Esophagogastroduodenoscopy N/A 04/21/2015    Procedure: ESOPHAGOGASTRODUODENOSCOPY (EGD);  Surgeon: Willis Modena, MD;  Location: [facility] ENDOSCOPY;  Service: Endoscopy;  Laterality: N/A;  . Givens capsule study N/A 04/21/2015    Procedure: GIVENS CAPSULE STUDY;  Surgeon: Willis Modena, MD;  Location: [facility] ENDOSCOPY;  Service:  Endoscopy;  Laterality: N/A;      Medication List       This list is accurate as of: 04/24/15 11:59 PM.  Always use your most recent med list.               acetaminophen 500 MG tablet  Commonly known as:  TYLENOL  Take 1,000 mg by mouth every 4 (four) hours as needed.     aspirin 325 MG EC tablet  Take 1 tablet (325 mg total) by mouth daily with breakfast.     bisacodyl 10 MG suppository  Commonly known as:  DULCOLAX  Place 10 mg rectally as needed for moderate constipation.     feeding supplement (PRO-STAT SUGAR FREE 64) Liqd  Take 30 mLs by mouth daily.     ferrous sulfate 325 (65 FE) MG tablet  Take 325 mg by mouth daily with breakfast.     FLUoxetine 20 MG capsule  Commonly known as:  PROZAC  Take 60 mg by mouth daily.     furosemide 20 MG tablet  Commonly known as:  LASIX  Take 20 mg by mouth daily.     glucose blood test strip  1 each by Other route 4 (four) times daily -  before meals and at bedtime. Use as instructed     HYDROcodone-acetaminophen 5-325 MG per tablet  Commonly known as:  NORCO/VICODIN  Take 1-2 tablets by mouth every 6 (six) hours as needed for moderate pain.     hydrocortisone 2.5 % rectal cream  Commonly known as:  ANUSOL-HC  Place rectally 2 (two) times daily as needed for hemorrhoids.     insulin aspart 100 UNIT/ML injection  Commonly known as:  novoLOG  Inject 0-9 Units into the skin 3 (three) times daily with meals.     insulin glargine 100 UNIT/ML injection  Commonly known as:  LANTUS  Inject 0.2 mLs (20 Units total) into the skin at bedtime.     lamoTRIgine 25 MG tablet  Commonly known as:  LAMICTAL  Take 50 mg by mouth 2 (two) times daily.     levothyroxine 25 MCG tablet  Commonly known as:  SYNTHROID, LEVOTHROID  Take 25 mcg by mouth daily.     magnesium hydroxide 400 MG/5ML suspension  Commonly known as:  MILK OF MAGNESIA  Take 30 mLs by mouth daily as needed for mild constipation.     meclizine 25 MG tablet   Commonly known as:  ANTIVERT  Take 25 mg by mouth daily.     Melatonin 10 MG Tabs  Take 10 mg by mouth at bedtime.     multivitamin with minerals Tabs tablet  Take 1 tablet by mouth daily.     pantoprazole 40 MG tablet  Commonly known as:  PROTONIX  Take 1 tablet (40 mg total) by mouth 2 (two) times daily.     phenylephrine-shark liver oil-mineral oil-petrolatum 0.25-3-14-71.9 % rectal ointment  Commonly known as:  PREPARATION H  Place 1 application rectally 2 (two) times daily as needed for hemorrhoids.     polyethylene glycol packet  Commonly known as:  MIRALAX / GLYCOLAX  Take 17 g by mouth daily as needed for mild constipation.     promethazine 25 MG tablet  Commonly known as:  PHENERGAN  Take 25 mg by mouth every 6 (six) hours as needed for nausea or vomiting.     RA SALINE ENEMA RE  Place 1 each rectally as needed (for constipation).     simvastatin 40 MG tablet  Commonly known as:  ZOCOR  Take 40 mg by mouth at bedtime.     sodium chloride 0.65 % Soln nasal spray  Commonly known as:  OCEAN  Place 1 spray into both nostrils 3 (three) times daily as needed (for nosebleed).     spironolactone 25 MG tablet  Commonly known as:  ALDACTONE  Take 25 mg by mouth daily.     tamsulosin 0.4 MG Caps capsule  Commonly known as:  FLOMAX  Take 0.4 mg by mouth daily.     traZODone 50 MG tablet  Commonly known as:  DESYREL  Take 75 mg by mouth at bedtime.     UNABLE TO FIND  Take 1 each by mouth 2 (two) times daily. Med Name: magic cup     Vitamin D3 1000 UNITS Caps  Take 1,000 Units by mouth every morning.        No orders of the defined types were placed in this encounter.    Immunization History  Administered Date(s) Administered  . Influenza-Unspecified 09/13/2011  . PPD Test 01/07/2014  . Pneumococcal Polysaccharide-23 04/22/2015  . Pneumococcal-Unspecified 12/13/2009  . Tdap 12/13/2009    History  Substance Use Topics  . Smoking status: Former  Smoker -- 1.00 packs/day    Types: Cigarettes  . Smokeless tobacco: Not on file  . Alcohol Use: No    Family history is noncontributory    Review of Systems  DATA OBTAINED: from patient, nurse GENERAL:  no fevers, fatigue, appetite changes SKIN: No itching, rash  or wounds EYES: No eye pain, redness, discharge EARS: No earache, tinnitus, change in hearing NOSE: No congestion, drainage or bleeding  MOUTH/THROAT: No mouth or tooth pain, No sore throat RESPIRATORY: No cough, wheezing, SOB CARDIAC: No chest pain, palpitations, lower extremity edema  GI: No abdominal pain, No N/V/D or constipation, No heartburn or reflux  GU: No dysuria, frequency or urgency, or incontinence  MUSCULOSKELETAL: No unrelieved bone/joint pain NEUROLOGIC: No headache, dizziness or focal weakness PSYCHIATRIC: No overt anxiety or sadness, No behavior issue.   Filed Vitals:   04/27/15 1136  BP: 128/62  Pulse: 73  Temp: 97.1 F (36.2 C)  Resp: 20    Physical Exam  GENERAL APPEARANCE: Alert, conversant,  No acute distress.  SKIN: No diaphoresis rash HEAD: Normocephalic, atraumatic  EYES: Conjunctiva/lids clear. Pupils round, reactive. EOMs intact.  EARS: External exam WNL, canals clear. Hearing grossly normal.  NOSE: No deformity or discharge.  MOUTH/THROAT: Lips w/o lesions  RESPIRATORY: Breathing is even, unlabored. Lung sounds are clear   CARDIOVASCULAR: Heart RRR 4/6 systolic murmur, no rubs or gallops.1+ peripheral edema.   GASTROINTESTINAL: Abdomen is soft, non-tender, not distended w/ normal bowel sounds. GENITOURINARY: Bladder non tender, not distended  MUSCULOSKELETAL: No abnormal joints or musculature NEUROLOGIC:  Cranial nerves 2-12 grossly intact. Moves all extremities  PSYCHIATRIC: Mood and affect appropriate to situation, no behavioral issues  Patient Active Problem List   Diagnosis Date Noted  . Acute blood loss anemia 04/19/2015  . GI bleed 04/19/2015  . Closed right hip  fracture 03/24/2015  . Murmur, cardiac 03/24/2015  . Abnormal rhythmic movement of tongue 03/24/2015  . Liver cirrhosis 03/24/2015  . Closed intertrochanteric fracture of right hip 03/24/2015    Class: Acute  . Urinary retention 01/15/2015  . CKD (chronic kidney disease) stage 3, GFR 30-59 ml/min 01/15/2015  . Closed left hip fracture 01/07/2015  . Hip fracture, left 01/07/2015  . Hip fracture 01/07/2015  . Hypothyroidism 01/07/2015  . Nausea vomiting and diarrhea   . Generalized weakness 07/20/2013  . Other pancytopenia 02/23/2013  . Internal and external bleeding hemorrhoids 11/29/2012  . Pruritus ani 11/29/2012  . DM (diabetes mellitus), type 2 with complications 11/13/2012  . Type 1 diabetes mellitus with neurological manifestations, uncontrolled 01/24/2012  . Depression 01/24/2012  . Psoriasis 01/24/2012  . Macrocytic anemia   . Anxiety   . GERD (gastroesophageal reflux disease)   . Hyperlipidemia   . Hypertension     CBC    Component Value Date/Time   WBC 2.4* 04/22/2015 0510   WBC 3.1* 01/23/2015 1313   RBC 2.83* 04/22/2015 0510   RBC 3.32* 01/23/2015 1313   RBC 4.13* 09/27/2009 0545   HGB 8.1* 04/22/2015 0510   HGB 9.4* 01/23/2015 1313   HCT 25.1* 04/22/2015 0510   HCT 29.9* 01/23/2015 1313   PLT 105* 04/22/2015 0510   PLT 148 01/23/2015 1313   MCV 88.7 04/22/2015 0510   MCV 90.0 01/23/2015 1313   LYMPHSABS 0.5* 03/26/2015 0534   LYMPHSABS 0.4* 01/23/2015 1313   MONOABS 0.5 03/26/2015 0534   MONOABS 0.2 01/23/2015 1313   EOSABS 0.1 03/26/2015 0534   EOSABS 0.1 01/23/2015 1313   BASOSABS 0.0 03/26/2015 0534   BASOSABS 0.0 01/23/2015 1313    CMP     Component Value Date/Time   NA 137 04/22/2015 0510   NA 138 01/23/2015 1313   K 3.9 04/22/2015 0510   K 4.6 01/23/2015 1313   CL 107 04/22/2015 0510   CO2 23 04/22/2015  0510   CO2 24 01/23/2015 1313   GLUCOSE 147* 04/22/2015 0510   GLUCOSE 224* 01/23/2015 1313   BUN 18 04/22/2015 0510   BUN 31.2*  01/23/2015 1313   CREATININE 1.07 04/22/2015 0510   CREATININE 1.2 01/23/2015 1313   CALCIUM 8.1* 04/22/2015 0510   CALCIUM 9.1 01/23/2015 1313   PROT 6.5 04/22/2015 0510   PROT 7.7 01/23/2015 1313   ALBUMIN 1.7* 04/22/2015 0510   ALBUMIN 2.7* 01/23/2015 1313   AST 57* 04/22/2015 0510   AST 57* 01/23/2015 1313   ALT 24 04/22/2015 0510   ALT 26 01/23/2015 1313   ALKPHOS 262* 04/22/2015 0510   ALKPHOS 144 01/23/2015 1313   BILITOT 1.4* 04/22/2015 0510   BILITOT 0.90 01/23/2015 1313   GFRNONAA >60 04/22/2015 0510   GFRAA >60 04/22/2015 0510    Assessment and Plan  GI bleed HGB was checked at the NH due to several days of black tarry stools. His HGB at the NH read low at 6.2.Acute blood loss anemia due to GI bleed - this on top of chronic pancytopenia due to cirrhosis and splenomegally (See Dr. Wynonia MustyShaddad's office note for more info on the chronic component). s/p 2 units PRBC EGD: Solitary Gr-II distal esophageal varix without stigmata of hemorrhage. Nodular antral gastritis. Erosion in duodenal bulb without overt bleeding. Couple punctate red regions in proximal duodenum, scope trauma versus AVMs. No active bleeding noted     Liver cirrhosis Lasix/spironolactone; will start Na+ 2gm restriction and weekly weights   DM (diabetes mellitus), type 2 with complications Pt been dec to lantus 20 u with SSI;pt on ace and station   Hypothyroidism Continue synthroid     Margit HanksALEXANDER, Stacey Maura D, MD

## 2015-04-24 NOTE — Assessment & Plan Note (Signed)
Pt been dec to lantus 20 u with SSI;pt on ace and station

## 2015-04-24 NOTE — Assessment & Plan Note (Signed)
Lasix/spironolactone; will start Na+ 2gm restriction and weekly weights

## 2015-04-24 NOTE — Assessment & Plan Note (Signed)
HGB was checked at the NH due to several days of black tarry stools. His HGB at the NH read low at 6.2.Acute blood loss anemia due to GI bleed - this on top of chronic pancytopenia due to cirrhosis and splenomegally (See Dr. Wynonia MustyShaddad's office note for more info on the chronic component). s/p 2 units PRBC EGD: Solitary Gr-II distal esophageal varix without stigmata of hemorrhage. Nodular antral gastritis. Erosion in duodenal bulb without overt bleeding. Couple punctate red regions in proximal duodenum, scope trauma versus AVMs. No active bleeding noted

## 2015-04-24 NOTE — Assessment & Plan Note (Signed)
Continue synthroid.

## 2015-04-25 ENCOUNTER — Telehealth: Payer: Self-pay | Admitting: *Deleted

## 2015-04-25 NOTE — Telephone Encounter (Signed)
Sister celeste calling to request an earlier appt to see dr Clelia Croftshadad. Patient has had 2 broken legs and endoscopy and received multiple units of blood. Per dr Clelia Croftshadad, he feels patient is receiving excellent care and he agrees with everything they are doing. [name] verbalized understanding. Encouraged to keep regularly scheduled appt on 06/24/15.

## 2015-04-27 ENCOUNTER — Encounter: Payer: Self-pay | Admitting: Internal Medicine

## 2015-04-28 ENCOUNTER — Non-Acute Institutional Stay (SKILLED_NURSING_FACILITY): Payer: Medicare Other | Admitting: Internal Medicine

## 2015-04-28 ENCOUNTER — Encounter: Payer: Self-pay | Admitting: Internal Medicine

## 2015-04-28 DIAGNOSIS — Z710 Person encountering health services to consult on behalf of another person: Secondary | ICD-10-CM | POA: Diagnosis not present

## 2015-04-28 DIAGNOSIS — K7031 Alcoholic cirrhosis of liver with ascites: Secondary | ICD-10-CM

## 2015-04-28 DIAGNOSIS — R600 Localized edema: Secondary | ICD-10-CM | POA: Diagnosis not present

## 2015-04-28 NOTE — Assessment & Plan Note (Signed)
Underlying cause of pancytopenia and peripheral edema and ascites.

## 2015-04-28 NOTE — Assessment & Plan Note (Addendum)
2/2 cirrhosis and ascites. Increased lasix to 40 mg daily and spironolactone to 50 mg BID with a bmp weekly on Mondays to follow K+ and BUN/Cr; placing foley for I's and O's

## 2015-04-28 NOTE — Assessment & Plan Note (Signed)
I spoke with pt's sister at length. She felt like he was doing just fine 2 months ago and now he was falling apart. Nursing had told me that she had wanted to rush him to the hospital because he had swelling in extremities and abdomen. In reality pt had gained only  2 1/2 pounds since his visit with Dr Matthias HughsBuccini 7 days ago and had only been on diuretics for 5 days per Dr Buccini's rec.I assurred her that Dr Warrick ParisianBucinni and I had spoken and were aware of his fluid problem 2/2 to cirrhosis. I explained this to her in some detail as well as his continued need probably for transfusions 2/2 pancytopenia and possible need in future for paracentesis, but not quite yet, and need to protect his kidneys. She perceived that this had all happened suddenly but in fact pt had had his problems for quite a while but had  been fairly asymptomatic until recently.

## 2015-04-28 NOTE — Progress Notes (Signed)
MRN: [id number] Name: [name]  Sex: male Age: 69 y.o. DOB: 16-Aug-1946  [id number] Facility/Room:220 Level Of Care: SNF Provider: Merrilee SeashoreALEXANDER, Eddith Mentor D Emergency Contacts: Extended Emergency Contact Information Primary Emergency Contact: [name],[name] Address: [address]          [place], [place] Home Phone: [phone] Mobile Phone: [phone] Relation: Sister  Code Status: DNR  Allergies: Penicillins  Chief Complaint  Patient presents with  . encounter with family without pt present    HPI: Patient is 69 y.o. male whose sister needs to be caught up on recent developments from long time cirrhosis.  Past Medical History  Diagnosis Date  . Hyperglycemia   . Hyponatremia   . DMII (diabetes mellitus, type 2)   . Depression   . IBS (irritable bowel syndrome)   . Chest pain   . Unspecified gastritis and gastroduodenitis without mention of hemorrhage   . Arthropathy, unspecified, site unspecified   . History of colon polyps   . Anemia, unspecified   . Anxiety   . GERD (gastroesophageal reflux disease)   . Hyperlipidemia   . Hypertension     Past Surgical History  Procedure Laterality Date  . Hernia repair  1964  . Intramedullary (im) nail intertrochanteric Left 01/07/2015    Procedure: INTRAMEDULLARY (IM) NAIL INTERTROCHANTRIC;  Surgeon: Kathryne Hitchhristopher Y Blackman, MD;  Location: [facility];  Service: Orthopedics;  Laterality: Left;  . Femur im nail Right 03/24/2015    Procedure: CLOSED REDUCTION INTERNAL FIXATION;  Surgeon: Kerrin ChampagneJames E Nitka, MD;  Location: [facility] OR;  Service: Orthopedics;  Laterality: Right;  . Esophagogastroduodenoscopy N/A 04/21/2015    Procedure: ESOPHAGOGASTRODUODENOSCOPY (EGD);  Surgeon: Willis ModenaWilliam Outlaw, MD;  [facility];  Service: Endoscopy;  Laterality: N/A;  . Givens capsule study N/A 04/21/2015    Procedure: GIVENS CAPSULE STUDY;  Surgeon: Willis ModenaWilliam Outlaw, MD;  [facility];  Service: Endoscopy;   Laterality: N/A;      Medication List       This list is accurate as of: 04/28/15  7:47 PM.  Always use your most recent med list.               acetaminophen 500 MG tablet  Commonly known as:  TYLENOL  Take 1,000 mg by mouth every 4 (four) hours as needed.     aspirin 325 MG EC tablet  Take 1 tablet (325 mg total) by mouth daily with breakfast.     bisacodyl 10 MG suppository  Commonly known as:  DULCOLAX  Place 10 mg rectally as needed for moderate constipation.     feeding supplement (PRO-STAT SUGAR FREE 64) Liqd  Take 30 mLs by mouth daily.     ferrous sulfate 325 (65 FE) MG tablet  Take 325 mg by mouth daily with breakfast.     FLUoxetine 20 MG capsule  Commonly known as:  PROZAC  Take 60 mg by mouth daily.     furosemide 20 MG tablet  Commonly known as:  LASIX  Take 40 mg by mouth daily.     glucose blood test strip  1 each by Other route 4 (four) times daily -  before meals and at bedtime. Use as instructed     HYDROcodone-acetaminophen 5-325 MG per tablet  Commonly known as:  NORCO/VICODIN  Take 1-2 tablets by mouth every 6 (six) hours as needed for moderate pain.     hydrocortisone 2.5 % rectal cream  Commonly known as:  ANUSOL-HC  Place  rectally 2 (two) times daily as needed for hemorrhoids.     insulin aspart 100 UNIT/ML injection  Commonly known as:  novoLOG  Inject 0-9 Units into the skin 3 (three) times daily with meals.     insulin glargine 100 UNIT/ML injection  Commonly known as:  LANTUS  Inject 0.2 mLs (20 Units total) into the skin at bedtime.     lamoTRIgine 25 MG tablet  Commonly known as:  LAMICTAL  Take 50 mg by mouth 2 (two) times daily.     levothyroxine 25 MCG tablet  Commonly known as:  SYNTHROID, LEVOTHROID  Take 25 mcg by mouth daily.     magnesium hydroxide 400 MG/5ML suspension  Commonly known as:  MILK OF MAGNESIA  Take 30 mLs by mouth daily as needed for mild constipation.     meclizine 25 MG tablet  Commonly known  as:  ANTIVERT  Take 25 mg by mouth daily.     Melatonin 10 MG Tabs  Take 10 mg by mouth at bedtime.     multivitamin with minerals Tabs tablet  Take 1 tablet by mouth daily.     pantoprazole 40 MG tablet  Commonly known as:  PROTONIX  Take 1 tablet (40 mg total) by mouth 2 (two) times daily.     phenylephrine-shark liver oil-mineral oil-petrolatum 0.25-3-14-71.9 % rectal ointment  Commonly known as:  PREPARATION H  Place 1 application rectally 2 (two) times daily as needed for hemorrhoids.     polyethylene glycol packet  Commonly known as:  MIRALAX / GLYCOLAX  Take 17 g by mouth daily as needed for mild constipation.     promethazine 25 MG tablet  Commonly known as:  PHENERGAN  Take 25 mg by mouth every 6 (six) hours as needed for nausea or vomiting.     RA SALINE ENEMA RE  Place 1 each rectally as needed (for constipation).     simvastatin 40 MG tablet  Commonly known as:  ZOCOR  Take 40 mg by mouth at bedtime.     sodium chloride 0.65 % Soln nasal spray  Commonly known as:  OCEAN  Place 1 spray into both nostrils 3 (three) times daily as needed (for nosebleed).     spironolactone 25 MG tablet  Commonly known as:  ALDACTONE  Take 50 mg by mouth 2 (two) times daily.     tamsulosin 0.4 MG Caps capsule  Commonly known as:  FLOMAX  Take 0.4 mg by mouth daily.     traZODone 50 MG tablet  Commonly known as:  DESYREL  Take 75 mg by mouth at bedtime.     UNABLE TO FIND  Take 1 each by mouth 2 (two) times daily. Med Name: magic cup     Vitamin D3 1000 UNITS Caps  Take 1,000 Units by mouth every morning.        No orders of the defined types were placed in this encounter.    Immunization History  Administered Date(s) Administered  . Influenza-Unspecified 09/13/2011  . PPD Test 01/07/2014  . Pneumococcal Polysaccharide-23 04/22/2015  . Pneumococcal-Unspecified 12/13/2009  . Tdap 12/13/2009    History  Substance Use Topics  . Smoking status: Former Smoker  -- 1.00 packs/day    Types: Cigarettes  . Smokeless tobacco: Not on file  . Alcohol Use: No    Review of Systems  DATA OBTAINED: from nurse, medical record, family member GENERAL:  no fevers, fatigue, appetite changes SKIN: No itching, rash HEENT: No complaint RESPIRATORY:  No cough, wheezing, SOB CARDIAC: No chest pain, palpitations,+ lower extremity edema  GI: No abdominal pain, No N/V/D or constipation, No heartburn or reflux  GU: No dysuria, frequency or urgency, or incontinence  MUSCULOSKELETAL: No unrelieved bone/joint pain NEUROLOGIC: No headache, dizziness  PSYCHIATRIC: No overt anxiety or sadness  Filed Vitals:   04/28/15 1833  BP: 128/62  Pulse: 73  Temp: 97.1 F (36.2 C)  Resp: 20    Physical Exam  GENERAL APPEARANCE: Alert, conversant, No acute distress  SKIN: No diaphoresis rash HEENT: Unremarkable RESPIRATORY: Breathing is even, unlabored. Lung sounds are clear   CARDIOVASCULAR: Heart RRR no murmurs, rubs or gallops. 2+ peripheral edema  GASTROINTESTINAL: Abdomen is soft, non-tender, mild distended w/ normal bowel sounds.  GENITOURINARY: Bladder non tender, not distended  MUSCULOSKELETAL: No abnormal joints or musculature NEUROLOGIC: Cranial nerves 2-12 grossly intact. Moves all extremities PSYCHIATRIC: Mood and affect appropriate to situation, no behavioral issues  Patient Active Problem List   Diagnosis Date Noted  . Encounter for family conference without patient present 04/28/2015  . Edema of both legs 04/28/2015  . Acute blood loss anemia 04/19/2015  . GI bleed 04/19/2015  . Closed right hip fracture 03/24/2015  . Murmur, cardiac 03/24/2015  . Abnormal rhythmic movement of tongue 03/24/2015  . Liver cirrhosis 03/24/2015  . Closed intertrochanteric fracture of right hip 03/24/2015    Class: Acute  . Urinary retention 01/15/2015  . CKD (chronic kidney disease) stage 3, GFR 30-59 ml/min 01/15/2015  . Closed left hip fracture 01/07/2015  . Hip  fracture, left 01/07/2015  . Hip fracture 01/07/2015  . Hypothyroidism 01/07/2015  . Nausea vomiting and diarrhea   . Generalized weakness 07/20/2013  . Other pancytopenia 02/23/2013  . Internal and external bleeding hemorrhoids 11/29/2012  . Pruritus ani 11/29/2012  . DM (diabetes mellitus), type 2 with complications 11/13/2012  . Type 1 diabetes mellitus with neurological manifestations, uncontrolled 01/24/2012  . Depression 01/24/2012  . Psoriasis 01/24/2012  . Macrocytic anemia   . Anxiety   . GERD (gastroesophageal reflux disease)   . Hyperlipidemia   . Hypertension     CBC    Component Value Date/Time   WBC 2.4* 04/22/2015 0510   WBC 3.1* 01/23/2015 1313   RBC 2.83* 04/22/2015 0510   RBC 3.32* 01/23/2015 1313   RBC 4.13* 09/27/2009 0545   HGB 8.1* 04/22/2015 0510   HGB 9.4* 01/23/2015 1313   HCT 25.1* 04/22/2015 0510   HCT 29.9* 01/23/2015 1313   PLT 105* 04/22/2015 0510   PLT 148 01/23/2015 1313   MCV 88.7 04/22/2015 0510   MCV 90.0 01/23/2015 1313   LYMPHSABS 0.5* 03/26/2015 0534   LYMPHSABS 0.4* 01/23/2015 1313   MONOABS 0.5 03/26/2015 0534   MONOABS 0.2 01/23/2015 1313   EOSABS 0.1 03/26/2015 0534   EOSABS 0.1 01/23/2015 1313   BASOSABS 0.0 03/26/2015 0534   BASOSABS 0.0 01/23/2015 1313    CMP     Component Value Date/Time   NA 137 04/22/2015 0510   NA 138 01/23/2015 1313   K 3.9 04/22/2015 0510   K 4.6 01/23/2015 1313   CL 107 04/22/2015 0510   CO2 23 04/22/2015 0510   CO2 24 01/23/2015 1313   GLUCOSE 147* 04/22/2015 0510   GLUCOSE 224* 01/23/2015 1313   BUN 18 04/22/2015 0510   BUN 31.2* 01/23/2015 1313   CREATININE 1.07 04/22/2015 0510   CREATININE 1.2 01/23/2015 1313   CALCIUM 8.1* 04/22/2015 0510   CALCIUM 9.1 01/23/2015 1313  PROT 6.5 04/22/2015 0510   PROT 7.7 01/23/2015 1313   ALBUMIN 1.7* 04/22/2015 0510   ALBUMIN 2.7* 01/23/2015 1313   AST 57* 04/22/2015 0510   AST 57* 01/23/2015 1313   ALT 24 04/22/2015 0510   ALT 26  01/23/2015 1313   ALKPHOS 262* 04/22/2015 0510   ALKPHOS 144 01/23/2015 1313   BILITOT 1.4* 04/22/2015 0510   BILITOT 0.90 01/23/2015 1313   GFRNONAA >60 04/22/2015 0510   GFRAA >60 04/22/2015 0510    Assessment and Plan  Encounter for family conference without patient present I spoke with pt's sister at length. She felt like he was doing just fine 2 months ago and now he was falling apart. Nursing had told me that she had wanted to rush him to the hospital because he had swelling in extremities and abdomen. In reality pt had gained only  2 1/2 pounds since his visit with Dr Matthias Hughs 7 days ago and had only been on diuretics for 5 days per Dr Buccini's rec.I assurred her that Dr Warrick Parisian and I had spoken and were aware of his fluid problem 2/2 to cirrhosis. I explained this to her in some detail as well as his continued need probably for transfusions 2/2 pancytopenia and possible need in future for paracentesis, but not quite yet, and need to protect his kidneys. She perceived that this had all happened suddenly but in fact pt had had his problems for quite a while but had  been fairly asymptomatic until recently.   Edema of both legs 2/2 cirrhosis and ascites. Increased lasix to 40 mg daily and spironolactone to 50 mg BID with a bmp weekly on Mondays to follow K+ and BUN/Cr; placing foley for I's and O's   Liver cirrhosis Underlying cause of pancytopenia and peripheral edema and ascites.    Time spent > 35 Margit Hanks, MD

## 2015-04-30 ENCOUNTER — Telehealth: Payer: Self-pay | Admitting: Oncology

## 2015-04-30 ENCOUNTER — Inpatient Hospital Stay (HOSPITAL_COMMUNITY)
Admission: EM | Admit: 2015-04-30 | Discharge: 2015-05-02 | DRG: 432 | Disposition: A | Discharge status: Skilled Nursing Facility | Payer: Medicare Other | Attending: Internal Medicine | Admitting: Internal Medicine

## 2015-04-30 ENCOUNTER — Inpatient Hospital Stay (HOSPITAL_COMMUNITY): Payer: Medicare Other

## 2015-04-30 ENCOUNTER — Encounter (HOSPITAL_COMMUNITY): Payer: Self-pay | Admitting: Emergency Medicine

## 2015-04-30 ENCOUNTER — Emergency Department (HOSPITAL_COMMUNITY): Payer: Medicare Other

## 2015-04-30 DIAGNOSIS — Z87891 Personal history of nicotine dependence: Secondary | ICD-10-CM | POA: Diagnosis not present

## 2015-04-30 DIAGNOSIS — Z7982 Long term (current) use of aspirin: Secondary | ICD-10-CM

## 2015-04-30 DIAGNOSIS — K589 Irritable bowel syndrome without diarrhea: Secondary | ICD-10-CM | POA: Diagnosis present

## 2015-04-30 DIAGNOSIS — I8500 Esophageal varices without bleeding: Secondary | ICD-10-CM | POA: Diagnosis present

## 2015-04-30 DIAGNOSIS — Z794 Long term (current) use of insulin: Secondary | ICD-10-CM

## 2015-04-30 DIAGNOSIS — K7290 Hepatic failure, unspecified without coma: Secondary | ICD-10-CM | POA: Diagnosis present

## 2015-04-30 DIAGNOSIS — Q2733 Arteriovenous malformation of digestive system vessel: Secondary | ICD-10-CM

## 2015-04-30 DIAGNOSIS — E1149 Type 2 diabetes mellitus with other diabetic neurological complication: Secondary | ICD-10-CM | POA: Diagnosis present

## 2015-04-30 DIAGNOSIS — F329 Major depressive disorder, single episode, unspecified: Secondary | ICD-10-CM

## 2015-04-30 DIAGNOSIS — R140 Abdominal distension (gaseous): Secondary | ICD-10-CM

## 2015-04-30 DIAGNOSIS — F32A Depression, unspecified: Secondary | ICD-10-CM

## 2015-04-30 DIAGNOSIS — K7460 Unspecified cirrhosis of liver: Secondary | ICD-10-CM | POA: Diagnosis present

## 2015-04-30 DIAGNOSIS — N183 Chronic kidney disease, stage 3 unspecified: Secondary | ICD-10-CM

## 2015-04-30 DIAGNOSIS — K2970 Gastritis, unspecified, without bleeding: Secondary | ICD-10-CM | POA: Diagnosis present

## 2015-04-30 DIAGNOSIS — K148 Other diseases of tongue: Secondary | ICD-10-CM

## 2015-04-30 DIAGNOSIS — S72001S Fracture of unspecified part of neck of right femur, sequela: Secondary | ICD-10-CM

## 2015-04-30 DIAGNOSIS — E1049 Type 1 diabetes mellitus with other diabetic neurological complication: Secondary | ICD-10-CM

## 2015-04-30 DIAGNOSIS — K648 Other hemorrhoids: Secondary | ICD-10-CM

## 2015-04-30 DIAGNOSIS — Z86010 Personal history of colonic polyps: Secondary | ICD-10-CM

## 2015-04-30 DIAGNOSIS — G9341 Metabolic encephalopathy: Secondary | ICD-10-CM | POA: Diagnosis not present

## 2015-04-30 DIAGNOSIS — D539 Nutritional anemia, unspecified: Secondary | ICD-10-CM

## 2015-04-30 DIAGNOSIS — K219 Gastro-esophageal reflux disease without esophagitis: Secondary | ICD-10-CM | POA: Diagnosis present

## 2015-04-30 DIAGNOSIS — G9340 Encephalopathy, unspecified: Secondary | ICD-10-CM | POA: Diagnosis not present

## 2015-04-30 DIAGNOSIS — R531 Weakness: Secondary | ICD-10-CM

## 2015-04-30 DIAGNOSIS — E034 Atrophy of thyroid (acquired): Secondary | ICD-10-CM

## 2015-04-30 DIAGNOSIS — R600 Localized edema: Secondary | ICD-10-CM

## 2015-04-30 DIAGNOSIS — Z880 Allergy status to penicillin: Secondary | ICD-10-CM | POA: Diagnosis not present

## 2015-04-30 DIAGNOSIS — Z66 Do not resuscitate: Secondary | ICD-10-CM | POA: Diagnosis present

## 2015-04-30 DIAGNOSIS — R112 Nausea with vomiting, unspecified: Secondary | ICD-10-CM

## 2015-04-30 DIAGNOSIS — IMO0002 Reserved for concepts with insufficient information to code with codable children: Secondary | ICD-10-CM | POA: Diagnosis present

## 2015-04-30 DIAGNOSIS — F419 Anxiety disorder, unspecified: Secondary | ICD-10-CM | POA: Diagnosis present

## 2015-04-30 DIAGNOSIS — I10 Essential (primary) hypertension: Secondary | ICD-10-CM | POA: Diagnosis present

## 2015-04-30 DIAGNOSIS — M129 Arthropathy, unspecified: Secondary | ICD-10-CM | POA: Diagnosis present

## 2015-04-30 DIAGNOSIS — D61818 Other pancytopenia: Secondary | ICD-10-CM | POA: Diagnosis not present

## 2015-04-30 DIAGNOSIS — E039 Hypothyroidism, unspecified: Secondary | ICD-10-CM | POA: Diagnosis present

## 2015-04-30 DIAGNOSIS — E1165 Type 2 diabetes mellitus with hyperglycemia: Secondary | ICD-10-CM | POA: Diagnosis present

## 2015-04-30 DIAGNOSIS — E118 Type 2 diabetes mellitus with unspecified complications: Secondary | ICD-10-CM

## 2015-04-30 DIAGNOSIS — K644 Residual hemorrhoidal skin tags: Secondary | ICD-10-CM

## 2015-04-30 DIAGNOSIS — R339 Retention of urine, unspecified: Secondary | ICD-10-CM | POA: Diagnosis present

## 2015-04-30 DIAGNOSIS — E785 Hyperlipidemia, unspecified: Secondary | ICD-10-CM | POA: Diagnosis present

## 2015-04-30 DIAGNOSIS — L409 Psoriasis, unspecified: Secondary | ICD-10-CM

## 2015-04-30 DIAGNOSIS — E1041 Type 1 diabetes mellitus with diabetic mononeuropathy: Secondary | ICD-10-CM | POA: Diagnosis not present

## 2015-04-30 DIAGNOSIS — R011 Cardiac murmur, unspecified: Secondary | ICD-10-CM

## 2015-04-30 DIAGNOSIS — L290 Pruritus ani: Secondary | ICD-10-CM

## 2015-04-30 DIAGNOSIS — R4182 Altered mental status, unspecified: Secondary | ICD-10-CM | POA: Diagnosis present

## 2015-04-30 DIAGNOSIS — S72002S Fracture of unspecified part of neck of left femur, sequela: Secondary | ICD-10-CM

## 2015-04-30 DIAGNOSIS — S72141S Displaced intertrochanteric fracture of right femur, sequela: Secondary | ICD-10-CM

## 2015-04-30 DIAGNOSIS — K7031 Alcoholic cirrhosis of liver with ascites: Secondary | ICD-10-CM | POA: Diagnosis present

## 2015-04-30 DIAGNOSIS — E1065 Type 1 diabetes mellitus with hyperglycemia: Secondary | ICD-10-CM

## 2015-04-30 DIAGNOSIS — Z710 Person encountering health services to consult on behalf of another person: Secondary | ICD-10-CM

## 2015-04-30 DIAGNOSIS — R197 Diarrhea, unspecified: Secondary | ICD-10-CM

## 2015-04-30 DIAGNOSIS — D62 Acute posthemorrhagic anemia: Secondary | ICD-10-CM

## 2015-04-30 DIAGNOSIS — K921 Melena: Secondary | ICD-10-CM

## 2015-04-30 HISTORY — DX: Liver disease, unspecified: K76.9

## 2015-04-30 LAB — PROTEIN, BODY FLUID

## 2015-04-30 LAB — COMPREHENSIVE METABOLIC PANEL
ALK PHOS: 225 U/L — AB (ref 38–126)
ALT: 25 U/L (ref 17–63)
AST: 57 U/L — ABNORMAL HIGH (ref 15–41)
Albumin: 1.9 g/dL — ABNORMAL LOW (ref 3.5–5.0)
Anion gap: 9 (ref 5–15)
BUN: 22 mg/dL — AB (ref 6–20)
CALCIUM: 8 mg/dL — AB (ref 8.9–10.3)
CO2: 21 mmol/L — ABNORMAL LOW (ref 22–32)
Chloride: 108 mmol/L (ref 101–111)
Creatinine, Ser: 0.97 mg/dL (ref 0.61–1.24)
GLUCOSE: 148 mg/dL — AB (ref 65–99)
POTASSIUM: 4 mmol/L (ref 3.5–5.1)
Sodium: 138 mmol/L (ref 135–145)
Total Bilirubin: 1.2 mg/dL (ref 0.3–1.2)
Total Protein: 7.1 g/dL (ref 6.5–8.1)

## 2015-04-30 LAB — I-STAT TROPONIN, ED: Troponin i, poc: 0 ng/mL (ref 0.00–0.08)

## 2015-04-30 LAB — URINALYSIS, ROUTINE W REFLEX MICROSCOPIC
Bilirubin Urine: NEGATIVE
GLUCOSE, UA: NEGATIVE mg/dL
KETONES UR: NEGATIVE mg/dL
Nitrite: NEGATIVE
PH: 7 (ref 5.0–8.0)
PROTEIN: NEGATIVE mg/dL
Specific Gravity, Urine: 1.016 (ref 1.005–1.030)
Urobilinogen, UA: 2 mg/dL — ABNORMAL HIGH (ref 0.0–1.0)

## 2015-04-30 LAB — CBC WITH DIFFERENTIAL/PLATELET
BASOS ABS: 0 10*3/uL (ref 0.0–0.1)
BASOS PCT: 0 % (ref 0–1)
EOS ABS: 0 10*3/uL (ref 0.0–0.7)
Eosinophils Relative: 1 % (ref 0–5)
HCT: 27.5 % — ABNORMAL LOW (ref 39.0–52.0)
HEMOGLOBIN: 8.9 g/dL — AB (ref 13.0–17.0)
Lymphocytes Relative: 13 % (ref 12–46)
Lymphs Abs: 0.4 10*3/uL — ABNORMAL LOW (ref 0.7–4.0)
MCH: 28.3 pg (ref 26.0–34.0)
MCHC: 32.4 g/dL (ref 30.0–36.0)
MCV: 87.3 fL (ref 78.0–100.0)
Monocytes Absolute: 0.2 10*3/uL (ref 0.1–1.0)
Monocytes Relative: 8 % (ref 3–12)
NEUTROS ABS: 2.5 10*3/uL (ref 1.7–7.7)
Neutrophils Relative %: 78 % — ABNORMAL HIGH (ref 43–77)
PLATELETS: 120 10*3/uL — AB (ref 150–400)
RBC: 3.15 MIL/uL — ABNORMAL LOW (ref 4.22–5.81)
RDW: 16.8 % — ABNORMAL HIGH (ref 11.5–15.5)
WBC: 3.2 10*3/uL — ABNORMAL LOW (ref 4.0–10.5)

## 2015-04-30 LAB — BODY FLUID CELL COUNT WITH DIFFERENTIAL
Eos, Fluid: 2 %
LYMPHS FL: 30 %
Monocyte-Macrophage-Serous Fluid: 63 % (ref 50–90)
NEUTROPHIL FLUID: 5 % (ref 0–25)
WBC FLUID: 118 uL (ref 0–1000)

## 2015-04-30 LAB — RAPID URINE DRUG SCREEN, HOSP PERFORMED
AMPHETAMINES: NOT DETECTED
Barbiturates: NOT DETECTED
Benzodiazepines: NOT DETECTED
COCAINE: NOT DETECTED
OPIATES: NOT DETECTED
Tetrahydrocannabinol: NOT DETECTED

## 2015-04-30 LAB — LACTATE DEHYDROGENASE, PLEURAL OR PERITONEAL FLUID: LD FL: 53 U/L — AB (ref 3–23)

## 2015-04-30 LAB — TSH: TSH: 2.728 u[IU]/mL (ref 0.350–4.500)

## 2015-04-30 LAB — URINE MICROSCOPIC-ADD ON

## 2015-04-30 LAB — I-STAT CG4 LACTIC ACID, ED: Lactic Acid, Venous: 1.98 mmol/L (ref 0.5–2.0)

## 2015-04-30 LAB — PROTIME-INR
INR: 1.63 — AB (ref 0.00–1.49)
Prothrombin Time: 19.5 seconds — ABNORMAL HIGH (ref 11.6–15.2)

## 2015-04-30 LAB — AMMONIA: Ammonia: 103 umol/L — ABNORMAL HIGH (ref 9–35)

## 2015-04-30 LAB — CBG MONITORING, ED: Glucose-Capillary: 139 mg/dL — ABNORMAL HIGH (ref 65–99)

## 2015-04-30 LAB — GLUCOSE, SEROUS FLUID: Glucose, Fluid: 142 mg/dL

## 2015-04-30 LAB — GLUCOSE, CAPILLARY
GLUCOSE-CAPILLARY: 90 mg/dL (ref 65–99)
Glucose-Capillary: 123 mg/dL — ABNORMAL HIGH (ref 65–99)

## 2015-04-30 LAB — POC OCCULT BLOOD, ED: Fecal Occult Bld: POSITIVE — AB

## 2015-04-30 LAB — BRAIN NATRIURETIC PEPTIDE: B NATRIURETIC PEPTIDE 5: 224.4 pg/mL — AB (ref 0.0–100.0)

## 2015-04-30 MED ORDER — LACTULOSE 10 GM/15ML PO SOLN
10.0000 g | Freq: Once | ORAL | Status: AC
  Administered 2015-04-30: 10 g via ORAL
  Filled 2015-04-30: qty 15

## 2015-04-30 MED ORDER — PANTOPRAZOLE SODIUM 40 MG IV SOLR
40.0000 mg | Freq: Two times a day (BID) | INTRAVENOUS | Status: DC
  Administered 2015-04-30 – 2015-05-01 (×2): 40 mg via INTRAVENOUS
  Filled 2015-04-30 (×3): qty 40

## 2015-04-30 MED ORDER — LACTULOSE ENEMA
300.0000 mL | ORAL | Status: DC
  Administered 2015-04-30 – 2015-05-01 (×5): 300 mL via RECTAL
  Filled 2015-04-30 (×20): qty 300

## 2015-04-30 MED ORDER — INSULIN ASPART 100 UNIT/ML ~~LOC~~ SOLN
0.0000 [IU] | Freq: Four times a day (QID) | SUBCUTANEOUS | Status: DC
  Administered 2015-05-01 – 2015-05-02 (×2): 2 [IU] via SUBCUTANEOUS
  Administered 2015-05-02 (×2): 1 [IU] via SUBCUTANEOUS

## 2015-04-30 MED ORDER — LACTULOSE ENEMA
300.0000 mL | Freq: Four times a day (QID) | ORAL | Status: DC
  Administered 2015-04-30: 300 mL via RECTAL
  Filled 2015-04-30 (×3): qty 300

## 2015-04-30 MED ORDER — HYDROCORTISONE 2.5 % RE CREA
TOPICAL_CREAM | Freq: Two times a day (BID) | RECTAL | Status: DC
  Administered 2015-04-30 – 2015-05-02 (×4): via RECTAL
  Filled 2015-04-30: qty 28.35

## 2015-04-30 MED ORDER — SALINE SPRAY 0.65 % NA SOLN
1.0000 | Freq: Three times a day (TID) | NASAL | Status: DC | PRN
  Filled 2015-04-30: qty 44

## 2015-04-30 MED ORDER — INSULIN GLARGINE 100 UNIT/ML ~~LOC~~ SOLN
5.0000 [IU] | Freq: Every day | SUBCUTANEOUS | Status: DC
  Administered 2015-04-30 – 2015-05-01 (×2): 5 [IU] via SUBCUTANEOUS
  Filled 2015-04-30 (×3): qty 0.05

## 2015-04-30 MED ORDER — PHENYLEPH-SHARK LIV OIL-MO-PET 0.25-3-14-71.9 % RE OINT
1.0000 "application " | TOPICAL_OINTMENT | Freq: Two times a day (BID) | RECTAL | Status: DC | PRN
  Filled 2015-04-30: qty 28.4

## 2015-04-30 MED ORDER — PIPERACILLIN-TAZOBACTAM 3.375 G IVPB
3.3750 g | Freq: Once | INTRAVENOUS | Status: AC
  Administered 2015-04-30: 3.375 g via INTRAVENOUS
  Filled 2015-04-30: qty 50

## 2015-04-30 MED ORDER — VANCOMYCIN HCL IN DEXTROSE 1-5 GM/200ML-% IV SOLN
1000.0000 mg | Freq: Once | INTRAVENOUS | Status: AC
  Administered 2015-04-30: 1000 mg via INTRAVENOUS
  Filled 2015-04-30: qty 200

## 2015-04-30 MED ORDER — LIDOCAINE-EPINEPHRINE (PF) 2 %-1:200000 IJ SOLN
10.0000 mL | Freq: Once | INTRAMUSCULAR | Status: AC
  Administered 2015-04-30: 10 mL via INTRADERMAL
  Filled 2015-04-30: qty 20

## 2015-04-30 MED ORDER — ENSURE ENLIVE PO LIQD
237.0000 mL | Freq: Two times a day (BID) | ORAL | Status: DC
  Administered 2015-05-01 – 2015-05-02 (×4): 237 mL via ORAL

## 2015-04-30 MED ORDER — MELATONIN 10 MG PO TABS: 10.0000 mg | ORAL_TABLET | Freq: Every day | ORAL | Status: DC

## 2015-04-30 MED ORDER — LACTULOSE ENEMA
300.0000 mL | Freq: Two times a day (BID) | ORAL | Status: DC
  Filled 2015-04-30 (×2): qty 300

## 2015-04-30 NOTE — H&P (Addendum)
[facility] History and Physical  [name] RUE:[id number] DOB: Apr 09, 1946 DOA: 04/30/2015  Referring physician:  Fayrene Helper PCP:  Margit Hanks, MD   Chief Complaint:  AMS  HPI:  The patient is a 69 y.o. year-old male with history of DM type 2, HTN, HLD, cirrhosis, GERD, rectal bleeding, hip fracture who presents with confusion.  He has had 3 hospitalizations in the last 5 months.  He underwent LEFT hip fracture s/p repair in January 2016 followed by RIGHT hip fracture in April 2016, also surgically repaired.  He was hospitalized again from 5/7-5/10 with rectal bleeding and hemoglobin of 6 mg/dl.  He underwent EGD with demonstrated a solitary gade 2 distal esophageal varix, some gastritis, an erosion in the duodenal bulb, and a couple of possible small AVMs in the proximal duodenum.  Capsule endoscopy was performed and the results are pending.  He was discharged to SNF.  Due to swelling, he had his diuretics increased to lasix  daily and spironolaactone  BID.  He developed urinary retention and had a foley catheter placed two days ago.  Around the same time, he developed slow speech, confusion, and lethargy.  He has not had fevers.  Patient is unable to contribute to history due to confusion and lethargy.    In the ER, VS stable.  Labs were notable for WBC 3.2, hgb 8.9, platelets 120, CO2 21, BUN 22, INR 1.63.  CT head no acute findings, stable compared to January.  Chest x-ray with low lung volumes and probable edema, difficult to exclude pneumonia.  Urinalysis with 7-10 day VPCs, small leukocyte esterase and negative nitrites.  Ammonia level 103.  He had a diagnostic paracentesis with pending cell count, however glucose is 142, LDH 53, total protein less than 3.  He was given vancomycin, Zosyn, and lactulose in the emergency department.   Review of Systems:  Unable to obtain due to patient confusion   Past Medical History  Diagnosis Date  . Hyperglycemia   .  Hyponatremia   . DMII (diabetes mellitus, type 2)   . Depression   . IBS (irritable bowel syndrome)   . Chest pain   . Unspecified gastritis and gastroduodenitis without mention of hemorrhage   . Arthropathy, unspecified, site unspecified   . History of colon polyps   . Anemia, unspecified   . Anxiety   . GERD (gastroesophageal reflux disease)   . Hyperlipidemia   . Hypertension   . Liver problem    Past Surgical History  Procedure Laterality Date  . Hernia repair  1964  . Intramedullary (im) nail intertrochanteric Left 01/07/2015    Procedure: INTRAMEDULLARY (IM) NAIL INTERTROCHANTRIC;  Surgeon: Kathryne Hitch, MD;  Location: [facility] ORS;  Service: Orthopedics;  Laterality: Left;  . Femur im nail Right 03/24/2015    Procedure: CLOSED REDUCTION INTERNAL FIXATION;  Surgeon: Kerrin Champagne, MD;  Location: [facility] OR;  Service: Orthopedics;  Laterality: Right;  . Esophagogastroduodenoscopy N/A 04/21/2015    Procedure: ESOPHAGOGASTRODUODENOSCOPY (EGD);  Surgeon: Willis Modena, MD;  Location: [facility] ENDOSCOPY;  Service: Endoscopy;  Laterality: N/A;  . Givens capsule study N/A 04/21/2015    Procedure: GIVENS CAPSULE STUDY;  Surgeon: Willis Modena, MD;  Location: [facility] ENDOSCOPY;  Service: Endoscopy;  Laterality: N/A;   Social History:  reports that he has quit smoking. His smoking use included Cigarettes. He smoked 1.00 pack per day. He does not have any smokeless tobacco history on file. He reports that he does not drink alcohol or use  illicit drugs. From SNF due to recent hip fracture and deconditioning from hemorrhage.  Allergies  Allergen Reactions  . Penicillins Hives    Family History  Problem Relation Age of Onset  . Stroke Mother   . Heart attack Father   . Heart disease Father   . Cancer Other     FH of Breast Cancer-other Relative  . Heart disease Other     Parent  . Hypertension Other     parent  . Cancer Maternal Uncle     colon     Prior to Admission medications    Medication Sig Start Date End Date Taking? Authorizing Provider  Amino Acids-Protein Hydrolys (FEEDING SUPPLEMENT, PRO-STAT SUGAR FREE 64,) LIQD Take 30 mLs by mouth daily.   Yes Historical Provider, MD  aspirin EC 325 MG EC tablet Take 1 tablet (325 mg total) by mouth daily with breakfast. 01/09/15  Yes Kathryne Hitch, MD  Cholecalciferol (VITAMIN D3) 1000 UNITS CAPS Take 1,000 Units by mouth every morning.    Yes Historical Provider, MD  ferrous sulfate 325 (65 FE) MG tablet Take 325 mg by mouth daily with breakfast.   Yes Historical Provider, MD  FLUoxetine (PROZAC) 20 MG capsule Take 60 mg by mouth daily.   Yes Historical Provider, MD  furosemide (LASIX) 20 MG tablet Take 40 mg by mouth daily.    Yes Historical Provider, MD  insulin aspart (NOVOLOG) 100 UNIT/ML injection Inject 0-9 Units into the skin 3 (three) times daily with meals. Patient taking differently: Inject 0-10 Units into the skin 3 (three) times daily with meals. <150=0u, 151-200=2u, 201-250=4u, 251-300=6u, 301-350=8u, 351-400=10u 01/10/15  Yes Calvert Cantor, MD  insulin glargine (LANTUS) 100 UNIT/ML injection Inject 0.2 mLs (20 Units total) into the skin at bedtime. 04/22/15  Yes Joseph Art, DO  lamoTRIgine (LAMICTAL) 25 MG tablet Take 50 mg by mouth 2 (two) times daily.   Yes Historical Provider, MD  meclizine (ANTIVERT) 25 MG tablet Take 25 mg by mouth daily.   Yes Historical Provider, MD  MELATONIN PO Take 1 tablet by mouth at bedtime.   Yes Historical Provider, MD  Multiple Vitamin (MULTIVITAMIN WITH MINERALS) TABS tablet Take 1 tablet by mouth daily.   Yes Historical Provider, MD  pantoprazole (PROTONIX) 40 MG tablet Take 1 tablet (40 mg total) by mouth 2 (two) times daily. 04/22/15  Yes Joseph Art, DO  simvastatin (ZOCOR) 40 MG tablet Take 40 mg by mouth at bedtime.  11/12/13  Yes Historical Provider, MD  Sodium Phosphates (RA SALINE ENEMA RE) Place 1 each rectally as needed (for constipation).   Yes Historical  Provider, MD  spironolactone (ALDACTONE) 25 MG tablet Take 50 mg by mouth 2 (two) times daily.    Yes Historical Provider, MD  tamsulosin (FLOMAX) 0.4 MG CAPS capsule Take 0.4 mg by mouth daily.    Yes Historical Provider, MD  traZODone (DESYREL) 50 MG tablet Take 75 mg by mouth at bedtime.    Yes Historical Provider, MD  UNABLE TO FIND Take 1 each by mouth daily. Med Name: magic cup   Yes Historical Provider, MD  acetaminophen (TYLENOL) 500 MG tablet Take 1,000 mg by mouth every 4 (four) hours as needed.    Historical Provider, MD  bisacodyl (DULCOLAX) 10 MG suppository Place 10 mg rectally as needed for moderate constipation.    Historical Provider, MD  glucose blood test strip 1 each by Other route 4 (four) times daily -  before meals and at bedtime.  Use as instructed    Historical Provider, MD  HYDROcodone-acetaminophen (NORCO/VICODIN) 5-325 MG per tablet Take 1-2 tablets by mouth every 6 (six) hours as needed for moderate pain. 04/22/15   Joseph Art, DO  hydrocortisone (ANUSOL-HC) 2.5 % rectal cream Place rectally 2 (two) times daily as needed for hemorrhoids. Patient not taking: Reported on 04/30/2015 04/22/15   Joseph Art, DO  levothyroxine (SYNTHROID, LEVOTHROID) 25 MCG tablet Take 25 mcg by mouth daily. 10/10/13   Historical Provider, MD  magnesium hydroxide (MILK OF MAGNESIA) 400 MG/5ML suspension Take 30 mLs by mouth daily as needed for mild constipation.    Historical Provider, MD  Melatonin 10 MG TABS Take 10 mg by mouth at bedtime.    Historical Provider, MD  phenylephrine-shark liver oil-mineral oil-petrolatum (PREPARATION H) 0.25-3-14-71.9 % rectal ointment Place 1 application rectally 2 (two) times daily as needed for hemorrhoids.    Historical Provider, MD  polyethylene glycol (MIRALAX / GLYCOLAX) packet Take 17 g by mouth daily as needed for mild constipation.     Historical Provider, MD  promethazine (PHENERGAN) 25 MG tablet Take 25 mg by mouth every 6 (six) hours as needed  for nausea or vomiting.    Historical Provider, MD  sodium chloride (OCEAN) 0.65 % SOLN nasal spray Place 1 spray into both nostrils 3 (three) times daily as needed (for nosebleed).    Historical Provider, MD   Physical Exam: Filed Vitals:   04/30/15 1215 04/30/15 1230 04/30/15 1245 04/30/15 1345  BP: 139/63 126/53 123/47 130/41  Pulse: 81 84 84 84  Temp:      TempSrc:      Resp: 12 21 14 13   SpO2: 96% 97% 99% 94%     General:  Adult male, breathing heavily, moving his arms and legs constantly but eyes are closed and he only opens them briefly to loud voice.  Grunts when asked questions and then falls back asleep.  Per RN he was able to drink some lactulose earlier.    Eyes:  PERRL, anicteric, non-injected.  ENT:  Nares clear.  OP clear, non-erythematous without plaques or exudates.  MMM.  Neck:  Supple without TM or JVD.    Lymph:  No cervical, supraclavicular, or submandibular LAD.  Cardiovascular:  RRR, normal S1, S2, 3/6 systolic murmur at the LSB.  2+ pulses, warm extremities  Respiratory:  CTA anteriorly, without increased WOB.  Abdomen:  NABS.  Soft, moderately distended but not tense, NT   Skin:  No rashes.  Some bruising on left foot  Musculoskeletal:  Normal bulk and tone.  Trace bilateral pitting LE edema.  Psychiatric:  Asleep but briefly arouseable.  Not able to assess orientation  Neurologic: no obvious facial droop.  Spontaneously moving all extremities.  Labs on Admission:  Basic Metabolic Panel:  Recent Labs Lab 04/30/15 0920  NA 138  K 4.0  CL 108  CO2 21*  GLUCOSE 148*  BUN 22*  CREATININE 0.97  CALCIUM 8.0*   Liver Function Tests:  Recent Labs Lab 04/30/15 0920  AST 57*  ALT 25  ALKPHOS 225*  BILITOT 1.2  PROT 7.1  ALBUMIN 1.9*   No results for input(s): LIPASE, AMYLASE in the last 168 hours.  Recent Labs Lab 04/30/15 1018  AMMONIA 103*   CBC:  Recent Labs Lab 04/30/15 0920  WBC 3.2*  NEUTROABS 2.5  HGB 8.9*  HCT  27.5*  MCV 87.3  PLT 120*   Cardiac Enzymes: No results for input(s): CKTOTAL, CKMB, CKMBINDEX,  TROPONINI in the last 168 hours.  BNP (last 3 results)  Recent Labs  04/30/15 0920  BNP 224.4*    ProBNP (last 3 results) No results for input(s): PROBNP in the last 8760 hours.  CBG:  Recent Labs Lab 04/30/15 0919  GLUCAP 139*    Radiological Exams on Admission: Ct Head Wo Contrast  04/30/2015   CLINICAL DATA:  Altered mental status. Patient lives at assisted living facility. No reported episodes of trauma.  EXAM: CT HEAD WITHOUT CONTRAST  TECHNIQUE: Contiguous axial images were obtained from the base of the skull through the vertex without intravenous contrast.  COMPARISON:  CT head 01/07/2015.  FINDINGS: No evidence for acute infarction, hemorrhage, mass lesion, hydrocephalus, or extra-axial fluid. Mild cerebral and cerebellar atrophy. Hypoattenuation of white matter, likely small vessel disease. No CT signs of large vessel occlusion. Calvarium intact. No acute sinus or mastoid disease.  IMPRESSION: No acute intracranial findings. Stable appearance compared with January.   Electronically Signed   By: Davonna BellingJohn  Curnes M.D.   On: 04/30/2015 11:52   Dg Chest Port 1 View  04/30/2015   CLINICAL DATA:  Altered mental status, elevated ammonia levels.  EXAM: PORTABLE CHEST - 1 VIEW  COMPARISON:  03/24/2015.  FINDINGS: Trachea is midline. Heart size stable. Lungs are low in volume with diffuse mixed interstitial and airspace opacification. No pleural fluid.  IMPRESSION: Low lung volumes with probable edema. Difficult to exclude pneumonia.   Electronically Signed   By: Leanna BattlesMelinda  Blietz M.D.   On: 04/30/2015 10:18    EKG: Independently reviewed. NSR, Q-waves in anteroseptal distribution.  Mildly prolonged QTc at 503  Assessment/Plan Active Problems:   Type 1 diabetes mellitus with neurological manifestations, uncontrolled   Hypertension   Other pancytopenia   Generalized weakness    Hypothyroidism   Urinary retention   Liver cirrhosis   Metabolic encephalopathy  ---  AMS, likely hepatic encephalopathy due to EtOH cirrhosis.  No clear evidence of underlying infection. -  CXR with low lung volumes, "cannot exclude pneumonia" however he is not hypoxic or in respiratory distress, coughing, febrile.  No leukocytosis. -  UA with only a few bacteria, 7-10 WBC, small LE, neg nitrite -  Check EEG -  Hold lamictal -  F/u cell count from paracentesis, but no obvious tenderness on exam -  Start lactulose therapy, rectal for now since I am not sure he is able to tolerate PO safely and I would like to avoid NG given known grade 2 varix -  Okay to transition to PO as soon as he is more alert -  Holding lasix and spironolactone while NPO -  No need for therapeutic paracentesis at this time  Chronic normocytic anemia due to chronic disease and acute blood loss recent GIB, hemoglobin at baseline.   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  HTN/HLD, blood pressure stable.  Hold oral medications until more alert.  Diabetes mellitus type 2, last A1c 6.3 12/2014 -  Decrease lantus to 5 units -  Start SSI q6h   GERD, stable, transition to IV protonix   Hypothyroidism,   -  TSH -  Resume synthroid when tolerating PO  Acute urinary retention -  F/u urine culture -  Continue foley since holding flomax due to  AMS  Diet:  NPO with oral care Access:  PIV IVF:  off Proph:  SCDs given recent unexplained GIB  Code Status: Full Family Communication: patient alone Disposition Plan: Admit to telemetry.  Anticipate discharge back to SNF in a few days  Time spent: 60 min Alabi, Ambermarie Honeyman [facility] Pager [phone]  If 7PM-7AM, please contact  night-coverage [email or web address] Password [email or web address] 04/30/2015, 2:54 PM

## 2015-04-30 NOTE — Telephone Encounter (Signed)
Spoke with patients sister and she is aware os his new appointment

## 2015-04-30 NOTE — Progress Notes (Signed)
EEG completed, results pending. 

## 2015-04-30 NOTE — ED Notes (Signed)
CBG is 139. Notified nurse Cicero DuckErika.

## 2015-04-30 NOTE — ED Provider Notes (Signed)
CSN: [id number]     Arrival date & time 04/30/15  0912 History   First MD Initiated Contact with Patient 04/30/15 0912     No chief complaint on file.    (Consider location/radiation/quality/duration/timing/severity/associated sxs/prior Treatment) HPI  69 year old male with hx of insulin dependent diabetes, depression, Liver cirrhosis, recent hospital admission for GI bleed causing anemia, hyponatremia brought here by EMS from [facility] for evaluation of AMS.  Pt release from hospital 2 days ago from GI bleed requiring blood product.  He normally is conversant but sister notice yesterday and today pt has not been acting normaly.  He only responsive to verbal stimuli.  He had a foley cath placed 2 days ago.  His initial CBG is 170.  Pt is in rehab facility for bilateral left fx.  Level V caveat applies due to AMS.      Past Medical History  Diagnosis Date  . Hyperglycemia   . Hyponatremia   . DMII (diabetes mellitus, type 2)   . Depression   . IBS (irritable bowel syndrome)   . Chest pain   . Unspecified gastritis and gastroduodenitis without mention of hemorrhage   . Arthropathy, unspecified, site unspecified   . History of colon polyps   . Anemia, unspecified   . Anxiety   . GERD (gastroesophageal reflux disease)   . Hyperlipidemia   . Hypertension    Past Surgical History  Procedure Laterality Date  . Hernia repair  1964  . Intramedullary (im) nail intertrochanteric Left 01/07/2015    Procedure: INTRAMEDULLARY (IM) NAIL INTERTROCHANTRIC;  Surgeon: Kathryne Hitchhristopher Y Blackman, MD;  Location: [facility] ORS;  Service: Orthopedics;  Laterality: Left;  . Femur im nail Right 03/24/2015    Procedure: CLOSED REDUCTION INTERNAL FIXATION;  Surgeon: Kerrin ChampagneJames E Nitka, MD;  Location: [facility] OR;  Service: Orthopedics;  Laterality: Right;  . Esophagogastroduodenoscopy N/A 04/21/2015    Procedure: ESOPHAGOGASTRODUODENOSCOPY (EGD);  Surgeon: Willis ModenaWilliam Outlaw, MD;  [facility];  Service: Endoscopy;   Laterality: N/A;  . Givens capsule study N/A 04/21/2015    Procedure: GIVENS CAPSULE STUDY;  Surgeon: Willis ModenaWilliam Outlaw, MD;  [facility];  Service: Endoscopy;  Laterality: N/A;   Family History  Problem Relation Age of Onset  . Stroke Mother   . Heart attack Father   . Heart disease Father   . Cancer Other     FH of Breast Cancer-other Relative  . Heart disease Other     Parent  . Hypertension Other     parent  . Cancer Maternal Uncle     colon   History  Substance Use Topics  . Smoking status: Former Smoker -- 1.00 packs/day    Types: Cigarettes  . Smokeless tobacco: Not on file  . Alcohol Use: No    Review of Systems  Unable to perform ROS: Mental status change      Allergies  Penicillins  Home Medications   Prior to Admission medications   Medication Sig Start Date End Date Taking? Authorizing Provider  acetaminophen (TYLENOL) 500 MG tablet Take 1,000 mg by mouth every 4 (four) hours as needed.    Historical Provider, MD  Amino Acids-Protein Hydrolys (FEEDING SUPPLEMENT, PRO-STAT SUGAR FREE 64,) LIQD Take 30 mLs by mouth daily.    Historical Provider, MD  aspirin EC 325 MG EC tablet Take 1 tablet (325 mg total) by mouth daily with breakfast. 01/09/15   Kathryne Hitchhristopher Y Blackman, MD  bisacodyl (DULCOLAX) 10 MG suppository Place 10 mg rectally as needed for  moderate constipation.    Historical Provider, MD  Cholecalciferol (VITAMIN D3) 1000 UNITS CAPS Take 1,000 Units by mouth every morning.     Historical Provider, MD  ferrous sulfate 325 (65 FE) MG tablet Take 325 mg by mouth daily with breakfast.    Historical Provider, MD  FLUoxetine (PROZAC) 20 MG capsule Take 60 mg by mouth daily.    Historical Provider, MD  furosemide (LASIX) 20 MG tablet Take 40 mg by mouth daily.     Historical Provider, MD  glucose blood test strip 1 each by Other route 4 (four) times daily -  before meals and at bedtime. Use as instructed    Historical Provider, MD   HYDROcodone-acetaminophen (NORCO/VICODIN) 5-325 MG per tablet Take 1-2 tablets by mouth every 6 (six) hours as needed for moderate pain. 04/22/15   Joseph Art, DO  hydrocortisone (ANUSOL-HC) 2.5 % rectal cream Place rectally 2 (two) times daily as needed for hemorrhoids. 04/22/15   Joseph Art, DO  insulin aspart (NOVOLOG) 100 UNIT/ML injection Inject 0-9 Units into the skin 3 (three) times daily with meals. 01/10/15   Calvert Cantor, MD  insulin glargine (LANTUS) 100 UNIT/ML injection Inject 0.2 mLs (20 Units total) into the skin at bedtime. 04/22/15   Joseph Art, DO  lamoTRIgine (LAMICTAL) 25 MG tablet Take 50 mg by mouth 2 (two) times daily.    Historical Provider, MD  levothyroxine (SYNTHROID, LEVOTHROID) 25 MCG tablet Take 25 mcg by mouth daily. 10/10/13   Historical Provider, MD  magnesium hydroxide (MILK OF MAGNESIA) 400 MG/5ML suspension Take 30 mLs by mouth daily as needed for mild constipation.    Historical Provider, MD  meclizine (ANTIVERT) 25 MG tablet Take 25 mg by mouth daily.    Historical Provider, MD  Melatonin 10 MG TABS Take 10 mg by mouth at bedtime.    Historical Provider, MD  Multiple Vitamin (MULTIVITAMIN WITH MINERALS) TABS tablet Take 1 tablet by mouth daily.    Historical Provider, MD  pantoprazole (PROTONIX) 40 MG tablet Take 1 tablet (40 mg total) by mouth 2 (two) times daily. 04/22/15   Joseph Art, DO  phenylephrine-shark liver oil-mineral oil-petrolatum (PREPARATION H) 0.25-3-14-71.9 % rectal ointment Place 1 application rectally 2 (two) times daily as needed for hemorrhoids.    Historical Provider, MD  polyethylene glycol (MIRALAX / GLYCOLAX) packet Take 17 g by mouth daily as needed for mild constipation.     Historical Provider, MD  promethazine (PHENERGAN) 25 MG tablet Take 25 mg by mouth every 6 (six) hours as needed for nausea or vomiting.    Historical Provider, MD  simvastatin (ZOCOR) 40 MG tablet Take 40 mg by mouth at bedtime.  11/12/13   Historical  Provider, MD  sodium chloride (OCEAN) 0.65 % SOLN nasal spray Place 1 spray into both nostrils 3 (three) times daily as needed (for nosebleed).    Historical Provider, MD  Sodium Phosphates (RA SALINE ENEMA RE) Place 1 each rectally as needed (for constipation).    Historical Provider, MD  spironolactone (ALDACTONE) 25 MG tablet Take 50 mg by mouth 2 (two) times daily.     Historical Provider, MD  tamsulosin (FLOMAX) 0.4 MG CAPS capsule Take 0.4 mg by mouth daily.     Historical Provider, MD  traZODone (DESYREL) 50 MG tablet Take 75 mg by mouth at bedtime.     Historical Provider, MD  UNABLE TO FIND Take 1 each by mouth 2 (two) times daily. Med Name: magic cup  Historical Provider, MD   There were no vitals taken for this visit. Physical Exam  Constitutional:  Frail-appearing Caucasian male, somnolence but arousable by voice.  HENT:  Head: Normocephalic and atraumatic.  Eyes: Conjunctivae are normal. Pupils are equal, round, and reactive to light.  Neck: No tracheal deviation present.  Cardiovascular: Normal rate.   Murmur (Holosystolic murmur) heard. Pulmonary/Chest: No respiratory distress. He has no wheezes.  Abdominal: Soft. He exhibits distension (Distended abdomen, nontender to palpation.).  Genitourinary:  Chaperone present: intact rectal tone, light brown stool, no mass, hemoccult positive  Musculoskeletal: He exhibits no edema.  Neurological: GCS eye subscore is 3. GCS verbal subscore is 1. GCS motor subscore is 5.  Nursing note and vitals reviewed.   ED Course  PARACENTESIS Date/Time: 04/30/2015 1:01 PM Performed by: Fayrene Helper Authorized by: Fayrene Helper Consent: Verbal consent obtained. Risks and benefits: risks, benefits and alternatives were discussed Consent given by: guardian Required items: required blood products, implants, devices, and special equipment available Patient identity confirmed: verbally with patient and arm band Time out: Immediately prior to  procedure a "time out" was called to verify the correct patient, procedure, equipment, support staff and site/side marked as required. Initial or subsequent exam: initial Procedure purpose: diagnostic Indications: suspected peritonitis Anesthesia: local infiltration Local anesthetic: lidocaine 2% with epinephrine Anesthetic total (ml): 8. Patient sedated: no Preparation: 10. Fluid removed: 10(ml) Fluid appearance: cloudy Dressing: pressure dressing Patient tolerance: Patient tolerated the procedure well with no immediate complications   (including critical care time)  Patient recently discharged from hospital due to GI bleed requiring blood transfusion. He is here for evaluation on command status. Patient is somnolence but responsive to   voice and painful stimuli. He has a distended abdomen, history of liver cirrhosis. Workup initiated.  Elevated level of ammonia, suspect metabolic encephalopathy causing his mental confusion. Hemoglobin is improved. He is warm to the touch however temperature is normal. He does have evidence of ascites therefore must rule out SBP. Consent for paracentesis obtain through sister who is his legal guardian. US guidance Paracentesis was performed by me under direct supervision of Dr. Micheline Maze. Will give lactulose and will consult for admission.  Appreciate consultation from [facility] Dr. [name] who agrees to admit pt for further management of metabolic encephalopathy.     Labs Review Labs Reviewed  CBC WITH DIFFERENTIAL/PLATELET - Abnormal; Notable for the following:    WBC 3.2 (*)    RBC 3.15 (*)    Hemoglobin 8.9 (*)    HCT 27.5 (*)    RDW 16.8 (*)    Platelets 120 (*)    Neutrophils Relative % 78 (*)    Lymphs Abs 0.4 (*)    All other components within normal limits  COMPREHENSIVE METABOLIC PANEL - Abnormal; Notable for the following:    CO2 21 (*)    Glucose, Bld 148 (*)    BUN 22 (*)    Calcium 8.0 (*)    Albumin 1.9 (*)    AST 57  (*)    Alkaline Phosphatase 225 (*)    All other components within normal limits  PROTIME-INR - Abnormal; Notable for the following:    Prothrombin Time 19.5 (*)    INR 1.63 (*)    All other components within normal limits  URINALYSIS, ROUTINE W REFLEX MICROSCOPIC - Abnormal; Notable for the following:    Hgb urine dipstick MODERATE (*)    Urobilinogen, UA 2.0 (*)    Leukocytes, UA SMALL (*)  All other components within normal limits  AMMONIA - Abnormal; Notable for the following:    Ammonia 103 (*)    All other components within normal limits  BRAIN NATRIURETIC PEPTIDE - Abnormal; Notable for the following:    B Natriuretic Peptide 224.4 (*)    All other components within normal limits  BODY FLUID CELL COUNT WITH DIFFERENTIAL - Abnormal; Notable for the following:    Appearance, Fluid CLOUDY (*)    All other components within normal limits  LACTATE DEHYDROGENASE, BODY FLUID - Abnormal; Notable for the following:    LD, Fluid 53 (*)    All other components within normal limits  URINE MICROSCOPIC-ADD ON - Abnormal; Notable for the following:    Squamous Epithelial / LPF FEW (*)    Bacteria, UA FEW (*)    All other components within normal limits  CBC - Abnormal; Notable for the following:    WBC 3.4 (*)    RBC 3.23 (*)    Hemoglobin 9.1 (*)    HCT 28.5 (*)    RDW 17.2 (*)    Platelets 121 (*)    All other components within normal limits  COMPREHENSIVE METABOLIC PANEL - Abnormal; Notable for the following:    Chloride 112 (*)    CO2 21 (*)    Glucose, Bld 106 (*)    Calcium 8.5 (*)    Albumin 1.9 (*)    AST 59 (*)    Alkaline Phosphatase 226 (*)    Total Bilirubin 1.5 (*)    All other components within normal limits  GLUCOSE, CAPILLARY - Abnormal; Notable for the following:    Glucose-Capillary 123 (*)    All other components within normal limits  CBG MONITORING, ED - Abnormal; Notable for the following:    Glucose-Capillary 139 (*)    All other components within  normal limits  POC OCCULT BLOOD, ED - Abnormal; Notable for the following:    Fecal Occult Bld POSITIVE (*)    All other components within normal limits  CULTURE, BLOOD (ROUTINE X 2)  CULTURE, BLOOD (ROUTINE X 2)  BODY FLUID CULTURE  GLUCOSE, SEROUS FLUID  PROTEIN, BODY FLUID  URINE RAPID DRUG SCREEN (HOSP PERFORMED)  TSH  AMMONIA  GLUCOSE, CAPILLARY  GLUCOSE, CAPILLARY  GLUCOSE, CAPILLARY  I-STAT TROPOININ, ED  I-STAT CG4 LACTIC ACID, ED    Imaging Review Ct Head Wo Contrast  04/30/2015   CLINICAL DATA:  Altered mental status. Patient lives at assisted living facility. No reported episodes of trauma.  EXAM: CT HEAD WITHOUT CONTRAST  TECHNIQUE: Contiguous axial images were obtained from the base of the skull through the vertex without intravenous contrast.  COMPARISON:  CT head 01/07/2015.  FINDINGS: No evidence for acute infarction, hemorrhage, mass lesion, hydrocephalus, or extra-axial fluid. Mild cerebral and cerebellar atrophy. Hypoattenuation of white matter, likely small vessel disease. No CT signs of large vessel occlusion. Calvarium intact. No acute sinus or mastoid disease.  IMPRESSION: No acute intracranial findings. Stable appearance compared with January.   Electronically Signed   By: Davonna Belling M.D.   On: 04/30/2015 11:52   Dg Chest Port 1 View  04/30/2015   CLINICAL DATA:  Altered mental status, elevated ammonia levels.  EXAM: PORTABLE CHEST - 1 VIEW  COMPARISON:  03/24/2015.  FINDINGS: Trachea is midline. Heart size stable. Lungs are low in volume with diffuse mixed interstitial and airspace opacification. No pleural fluid.  IMPRESSION: Low lung volumes with probable edema. Difficult to exclude pneumonia.  Electronically Signed   By: Leanna BattlesMelinda  Blietz M.D.   On: 04/30/2015 10:18   Dg Abd Portable 1v  05/01/2015   CLINICAL DATA:  Abdominal distention.  EXAM: PORTABLE ABDOMEN - 1 VIEW  COMPARISON:  CT 01/07/2015  FINDINGS: There is air throughout normal caliber small bowel  loops in the central abdomen. No bowel dilatation. Small amount of air in the distal colon, no findings of constipation. No radiopaque calculi. There are vascular calcifications. Pelvic phleboliths are noted. Postsurgical change of both proximal femurs. No evidence of free air on this single portable view.  IMPRESSION: Air throughout normal caliber small bowel loops, may be normal for this patient versus mild ileus. No obstruction.   Electronically Signed   By: Rubye OaksMelanie  Ehinger M.D.   On: 05/01/2015 03:05     EKG Interpretation   Date/Time:  Wednesday Apr 30 2015 09:12:35 EDT Ventricular Rate:  87 PR Interval:  158 QRS Duration: 91 QT Interval:  421 QTC Calculation: 506 R Axis:   -24 Text Interpretation:  Sinus rhythm Borderline left axis deviation  Anteroseptal infarct, old Prolonged QT interval which is new otherwise  unchanged Confirmed by DOCHERTY  MD, MEGAN [phone]([id number]) on 04/30/2015 9:24:14 AM      MDM   Final diagnoses:  Altered mental status    BP 150/64 mmHg  Pulse 81  Temp(Src) 97.8 F (36.6 C) (Oral)  Resp 18  Wt 174 lb 9.7 oz (79.2 kg)  SpO2 100%  I have reviewed nursing notes and vital signs. I personally reviewed the imaging tests through PACS system  I reviewed available ER/hospitalization records thought the EMR     Fayrene HelperBowie Martita Brumm, PA-C 05/01/15 1019  Toy CookeyMegan Docherty, MD 05/01/15 2059

## 2015-04-30 NOTE — ED Notes (Signed)
Pt Sister Leonides GrillsCeleste [name]: [phone]

## 2015-04-30 NOTE — Procedures (Signed)
ELECTROENCEPHALOGRAM REPORT   Patient: [name]       Room #: [id number] EEG No. ID: [id number] Age: 69 y.o.        Sex: male Referring Physician: Pichardo Report Date:  04/30/2015        Interpreting Physician: Thana FarrEYNOLDS, Verlena Marlette  History: [name] NakaiMichael L [name] is an 69 y.o. male with a confusion  Medications:  Scheduled: . [START ON 05/01/2015] feeding supplement (ENSURE ENLIVE)  237 mL Oral BID BM  . hydrocortisone   Rectal BID  . insulin aspart  0-9 Units Subcutaneous 4 times per day  . insulin glargine  5 Units Subcutaneous QHS  . lactulose  300 mL Rectal Q2H  . pantoprazole (PROTONIX) IV  40 mg Intravenous BID    Conditions of Recording:  This is a 16 channel EEG carried out with the patient in the poorly responsive state.  Description:  The background is poorly organized and consists of a low voltage, polymorphic delta activity that is diffusely distributed. This activity is continuous. There is some superimposed beta activity that is seen bilaterally, centrally and intermittently during the recording. At times some poorly formed vertex central sharp transients are noted as well suggesting stage II sleep.   No epileptiform activity is noted.  Hyperventilation and intermittent photic stimulation were not performed.   IMPRESSION: This EEG is characterized by slowing which at time appears to be consistent with normal sleep.  Can not rule out the possibility of slowing related to general cerebral disturbance such as a metabolic encephalopathy.  Clinical correlation recommended.  No epileptiform activity is noted.       Thana FarrLeslie Antwaine Boomhower, MD [facility] [phone] 04/30/2015, 7:39 PM

## 2015-04-30 NOTE — ED Notes (Signed)
[facility] Pt sister was at bedside at facility at stated that he was not acting right. Pt had foley catheter placed 2 days ago. Normally talkative, having conversations about sports but today pt is only responsive to verbal stimuli. 12 lead unremarkable, CBG-170 HR-72 BP-130/60 Pt is in rehab facility because of bilateral leg fx.

## 2015-05-01 ENCOUNTER — Inpatient Hospital Stay (HOSPITAL_COMMUNITY): Payer: Medicare Other

## 2015-05-01 DIAGNOSIS — E034 Atrophy of thyroid (acquired): Secondary | ICD-10-CM

## 2015-05-01 DIAGNOSIS — K7031 Alcoholic cirrhosis of liver with ascites: Principal | ICD-10-CM

## 2015-05-01 DIAGNOSIS — E1041 Type 1 diabetes mellitus with diabetic mononeuropathy: Secondary | ICD-10-CM

## 2015-05-01 DIAGNOSIS — E038 Other specified hypothyroidism: Secondary | ICD-10-CM

## 2015-05-01 DIAGNOSIS — G9340 Encephalopathy, unspecified: Secondary | ICD-10-CM

## 2015-05-01 DIAGNOSIS — D61818 Other pancytopenia: Secondary | ICD-10-CM

## 2015-05-01 DIAGNOSIS — R531 Weakness: Secondary | ICD-10-CM

## 2015-05-01 DIAGNOSIS — I10 Essential (primary) hypertension: Secondary | ICD-10-CM

## 2015-05-01 LAB — GLUCOSE, CAPILLARY
GLUCOSE-CAPILLARY: 92 mg/dL (ref 65–99)
GLUCOSE-CAPILLARY: 99 mg/dL (ref 65–99)
Glucose-Capillary: 111 mg/dL — ABNORMAL HIGH (ref 65–99)
Glucose-Capillary: 158 mg/dL — ABNORMAL HIGH (ref 65–99)

## 2015-05-01 LAB — COMPREHENSIVE METABOLIC PANEL
ALBUMIN: 1.9 g/dL — AB (ref 3.5–5.0)
ALK PHOS: 226 U/L — AB (ref 38–126)
ALT: 23 U/L (ref 17–63)
ANION GAP: 9 (ref 5–15)
AST: 59 U/L — AB (ref 15–41)
BILIRUBIN TOTAL: 1.5 mg/dL — AB (ref 0.3–1.2)
BUN: 16 mg/dL (ref 6–20)
CHLORIDE: 112 mmol/L — AB (ref 101–111)
CO2: 21 mmol/L — ABNORMAL LOW (ref 22–32)
Calcium: 8.5 mg/dL — ABNORMAL LOW (ref 8.9–10.3)
Creatinine, Ser: 0.95 mg/dL (ref 0.61–1.24)
GFR calc Af Amer: >60 mL/min (ref >60)
GFR calc non Af Amer: >60 mL/min (ref >60)
Glucose, Bld: 106 mg/dL — ABNORMAL HIGH (ref 65–99)
POTASSIUM: 3.6 mmol/L (ref 3.5–5.1)
SODIUM: 142 mmol/L (ref 135–145)
TOTAL PROTEIN: 7.4 g/dL (ref 6.5–8.1)

## 2015-05-01 LAB — CBC
HEMATOCRIT: 28.5 % — AB (ref 39.0–52.0)
HEMOGLOBIN: 9.1 g/dL — AB (ref 13.0–17.0)
MCH: 28.2 pg (ref 26.0–34.0)
MCHC: 31.9 g/dL (ref 30.0–36.0)
MCV: 88.2 fL (ref 78.0–100.0)
Platelets: 121 10*3/uL — ABNORMAL LOW (ref 150–400)
RBC: 3.23 MIL/uL — AB (ref 4.22–5.81)
RDW: 17.2 % — ABNORMAL HIGH (ref 11.5–15.5)
WBC: 3.4 10*3/uL — ABNORMAL LOW (ref 4.0–10.5)

## 2015-05-01 LAB — AMMONIA: Ammonia: 28 umol/L (ref 9–35)

## 2015-05-01 MED ORDER — PRO-STAT SUGAR FREE PO LIQD: 30.0000 mL | Freq: Three times a day (TID) | ORAL | Status: DC

## 2015-05-01 MED ORDER — LACTULOSE 10 GM/15ML PO SOLN
10.0000 g | Freq: Three times a day (TID) | ORAL | Status: DC
  Administered 2015-05-01 (×3): 10 g via ORAL
  Filled 2015-05-01 (×6): qty 15

## 2015-05-01 MED ORDER — PANTOPRAZOLE SODIUM 40 MG PO TBEC
40.0000 mg | DELAYED_RELEASE_TABLET | Freq: Two times a day (BID) | ORAL | Status: DC
  Administered 2015-05-01 – 2015-05-02 (×2): 40 mg via ORAL
  Filled 2015-05-01 (×2): qty 1

## 2015-05-01 MED ORDER — FUROSEMIDE 40 MG PO TABS
40.0000 mg | ORAL_TABLET | Freq: Every day | ORAL | Status: DC
  Administered 2015-05-01 – 2015-05-02 (×2): 40 mg via ORAL
  Filled 2015-05-01 (×2): qty 1

## 2015-05-01 MED ORDER — PRO-STAT SUGAR FREE PO LIQD: 30.0000 mL | Freq: Two times a day (BID) | ORAL | Status: DC

## 2015-05-01 MED ORDER — SPIRONOLACTONE 50 MG PO TABS
50.0000 mg | ORAL_TABLET | Freq: Two times a day (BID) | ORAL | Status: DC
  Administered 2015-05-01 – 2015-05-02 (×2): 50 mg via ORAL
  Filled 2015-05-01 (×4): qty 1

## 2015-05-01 MED ORDER — PANTOPRAZOLE SODIUM 40 MG PO TBEC: 40.0000 mg | DELAYED_RELEASE_TABLET | Freq: Two times a day (BID) | ORAL | Status: DC

## 2015-05-01 MED ORDER — LEVOTHYROXINE SODIUM 25 MCG PO TABS
25.0000 ug | ORAL_TABLET | Freq: Every day | ORAL | Status: DC
  Administered 2015-05-02: 25 ug via ORAL
  Filled 2015-05-01 (×2): qty 1

## 2015-05-01 MED ORDER — PRO-STAT SUGAR FREE PO LIQD
30.0000 mL | Freq: Two times a day (BID) | ORAL | Status: DC
  Administered 2015-05-01 – 2015-05-02 (×3): 30 mL via ORAL
  Filled 2015-05-01 (×3): qty 30

## 2015-05-01 MED ORDER — TAMSULOSIN HCL 0.4 MG PO CAPS
0.4000 mg | ORAL_CAPSULE | Freq: Every day | ORAL | Status: DC
  Administered 2015-05-01 – 2015-05-02 (×2): 0.4 mg via ORAL
  Filled 2015-05-01 (×2): qty 1

## 2015-05-01 NOTE — Progress Notes (Signed)
Utilization review completed.  

## 2015-05-01 NOTE — Progress Notes (Signed)
[facility]                                                                              Patient Demographics  [name], is a 69 y.o. male, DOB - [birth year], ZOX:[id number]  Admit date - 04/30/2015   Admitting Physician Renae Fickle, MD  Outpatient Primary MD for the patient is Margit Hanks, MD  LOS - 1   Chief Complaint  Patient presents with  . Altered Mental Status       Brief HPI   The patient is a 69 y.o. year-old male with history of DM type 2, HTN, HLD, cirrhosis, GERD, rectal bleeding, hip fracture who presents with confusion. He has had 3 hospitalizations in the last 5 months. He underwent LEFT hip fracture s/p repair in January 2016 followed by RIGHT hip fracture in April 2016, also surgically repaired. He was hospitalized again from 5/7-5/10 with rectal bleeding and hemoglobin of 6 mg/dl. He underwent EGD with demonstrated a solitary gade 2 distal esophageal varix, some gastritis, an erosion in the duodenal bulb, and a couple of possible small AVMs in the proximal duodenum. Capsule endoscopy was performed and the results are pending. He was discharged to SNF. Due to swelling, he had his diuretics increased to lasix  daily and spironolaactone  BID. He developed urinary retention and had a foley catheter placed two days prior to admission. Around the same time, he developed slow speech, confusion, and lethargy. He has not had fevers. Patient is unable to contribute to history due to confusion and lethargy.  In the ER, VS stable. Labs were notable for WBC 3.2, hgb 8.9, platelets 120, CO2 21, BUN 22, INR 1.63. CT head no acute findings, stable compared to January. Chest x-ray with low lung volumes and probable edema, difficult to exclude pneumonia. Urinalysis with 7-10 day VPCs, small leukocyte esterase and negative nitrites. Ammonia level 103. He had a diagnostic paracentesis with pending cell count, however glucose is 142, LDH 53,  total protein less than 3. He was given vancomycin, Zosyn, and lactulose in the emergency department.   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 I called patient's sister on the phone, left a detailed message, unable to make contact    Disposition Plan:  SNF when stable  Time Spent in minutes  25minutes  Procedures  Diagnostic paracentesis  Consults   None  DVT Prophylaxis  SCDs  Medications  Scheduled Meds: . feeding supplement (ENSURE ENLIVE)  237 mL Oral BID BM  . feeding supplement (PRO-STAT SUGAR FREE 64)  30 mL Oral BID BM  . hydrocortisone   Rectal BID  . insulin aspart  0-9 Units Subcutaneous 4 times per day  . insulin glargine  5 Units Subcutaneous QHS  .  lactulose  10 g Oral TID  . pantoprazole  40 mg Oral BID   Continuous Infusions:  PRN Meds:.phenylephrine-shark liver oil-mineral oil-petrolatum, sodium chloride   Antibiotics   Anti-infectives    Start     Dose/Rate Route Frequency Ordered Stop   04/30/15 1245  vancomycin (VANCOCIN) IVPB 1000 mg/200 mL premix     1,000 mg 200 mL/hr over 60 Minutes Intravenous  Once 04/30/15 1244 04/30/15 1540   04/30/15 1245  piperacillin-tazobactam (ZOSYN) IVPB 3.375 g     3.375 g 12.5 mL/hr over 240 Minutes Intravenous  Once 04/30/15 1244 04/30/15 1446        Subjective:   [name] was seen and examined today.  Patient is alert to self and place, and feels much better today. Patient denies dizziness, chest pain, shortness of breath, abdominal pain, N/V/D/C, new weakness, numbess, tingling. No acute events overnight.    Objective:   Blood pressure 150/64, pulse 81, temperature 97.8 F (36.6 C), temperature source Oral, resp. rate 18, weight 79.2 kg (174 lb 9.7 oz), SpO2 100 %.  Wt Readings from Last 3 Encounters:  04/30/15 79.2 kg (174 lb 9.7 oz)  04/19/15 75.297 kg (166 lb)  03/24/15 75.297 kg (166 lb)     Intake/Output Summary (Last 24 hours) at 05/01/15 1405 Last data filed at 05/01/15 1402  Gross per 24 hour  Intake    120 ml  Output   1900 ml  Net  -1780 ml    Exam  General: Alert and oriented x 2, NAD  HEENT:  PERRLA, EOMI, Anicteic Sclera, mucous membranes moist.   Neck: Supple, no JVD, no masses  CVS: S1 S2 auscultated, 3/6 SM at LSB  Respiratory: Clear to auscultation bilaterally, no wheezing, rales or rhonchi  Abdomen: Soft, nontender, +distended, + bowel sounds  Ext: no cyanosis clubbing or edema  Neuro: AAOx2, Cr N's II- XII. Strength 5/5 upper and lower extremities bilaterally  Skin: No rashes  Psych: Normal affect and demeanor, alert and oriented x2   Data Review   Micro Results Recent Results (from the past 240 hour(s))  Culture, blood  (routine x 2)     Status: None (Preliminary result)   Collection Time: 04/30/15 10:15 AM  Result Value Ref Range Status   Specimen Description BLOOD RIGHT ANTECUBITAL  Final   Special Requests BOTTLES DRAWN AEROBIC AND ANAEROBIC 10CC  Final   Culture   Final           BLOOD CULTURE RECEIVED NO GROWTH TO DATE CULTURE WILL BE HELD FOR 5 DAYS BEFORE ISSUING A FINAL NEGATIVE REPORT Performed at [facility]    Report Status PENDING  Incomplete  Culture, blood (routine x 2)     Status: None (Preliminary result)  Collection Time: 04/30/15 10:25 AM  Result Value Ref Range Status   Specimen Description BLOOD RIGHT HAND  Final   Special Requests BOTTLES DRAWN AEROBIC AND ANAEROBIC 10CC  Final   Culture   Final           BLOOD CULTURE RECEIVED NO GROWTH TO DATE CULTURE WILL BE HELD FOR 5 DAYS BEFORE ISSUING A FINAL NEGATIVE REPORT Performed at [facility]    Report Status PENDING  Incomplete  Body fluid culture ( includes gram stain)     Status: None (Preliminary result)   Collection Time: 04/30/15 12:45 PM  Result Value Ref Range Status   Specimen Description FLUID PERITONEAL CAVITY  Final   Special Requests Immunocompromised  Final   Gram Stain   Final    RARE WBC PRESENT, PREDOMINANTLY MONONUCLEAR NO ORGANISMS SEEN Performed at [facility]    Culture   Final    NO GROWTH 1 DAY Performed at [facility]    Report Status PENDING  Incomplete    Radiology Reports Ct Head Wo Contrast  04/30/2015   CLINICAL DATA:  Altered mental status. Patient lives at assisted living facility. No reported episodes of trauma.  EXAM: CT HEAD WITHOUT CONTRAST  TECHNIQUE: Contiguous axial images were obtained from the base of the skull through the vertex without intravenous contrast.  COMPARISON:  CT head 01/07/2015.  FINDINGS: No evidence for acute infarction, hemorrhage, mass lesion, hydrocephalus, or extra-axial fluid. Mild cerebral and cerebellar atrophy. Hypoattenuation  of white matter, likely small vessel disease. No CT signs of large vessel occlusion. Calvarium intact. No acute sinus or mastoid disease.  IMPRESSION: No acute intracranial findings. Stable appearance compared with January.   Electronically Signed   By: Davonna Belling M.D.   On: 04/30/2015 11:52   Dg Chest Port 1 View  04/30/2015   CLINICAL DATA:  Altered mental status, elevated ammonia levels.  EXAM: PORTABLE CHEST - 1 VIEW  COMPARISON:  03/24/2015.  FINDINGS: Trachea is midline. Heart size stable. Lungs are low in volume with diffuse mixed interstitial and airspace opacification. No pleural fluid.  IMPRESSION: Low lung volumes with probable edema. Difficult to exclude pneumonia.   Electronically Signed   By: Leanna Battles M.D.   On: 04/30/2015 10:18   Dg Abd Portable 1v  05/01/2015   CLINICAL DATA:  Abdominal distention.  EXAM: PORTABLE ABDOMEN - 1 VIEW  COMPARISON:  CT 01/07/2015  FINDINGS: There is air throughout normal caliber small bowel loops in the central abdomen. No bowel dilatation. Small amount of air in the distal colon, no findings of constipation. No radiopaque calculi. There are vascular calcifications. Pelvic phleboliths are noted. Postsurgical change of both proximal femurs. No evidence of free air on this single portable view.  IMPRESSION: Air throughout normal caliber small bowel loops, may be normal for this patient versus mild ileus. No obstruction.   Electronically Signed   By: Rubye Oaks M.D.   On: 05/01/2015 03:05    CBC  Recent Labs Lab 04/30/15 0920 05/01/15 0745  WBC 3.2* 3.4*  HGB 8.9* 9.1*  HCT 27.5* 28.5*  PLT 120* 121*  MCV 87.3 88.2  MCH 28.3 28.2  MCHC 32.4 31.9  RDW 16.8* 17.2*  LYMPHSABS 0.4*  --   MONOABS 0.2  --   EOSABS 0.0  --   BASOSABS 0.0  --     Chemistries   Recent Labs Lab 04/30/15 0920 05/01/15 0745  NA 138 142  K 4.0 3.6  CL  108 112*  CO2 21* 21*  GLUCOSE 148* 106*  BUN 22* 16  CREATININE 0.97 0.95  CALCIUM 8.0* 8.5*    AST 57* 59*  ALT 25 23  ALKPHOS 225* 226*  BILITOT 1.2 1.5*   ------------------------------------------------------------------------------------------------------------------ estimated creatinine clearance is 73.7 mL/min (by C-G formula based on Cr of 0.95). ------------------------------------------------------------------------------------------------------------------ No results for input(s): HGBA1C in the last 72 hours. ------------------------------------------------------------------------------------------------------------------ No results for input(s): CHOL, HDL, LDLCALC, TRIG, CHOLHDL, LDLDIRECT in the last 72 hours. ------------------------------------------------------------------------------------------------------------------  Recent Labs  04/30/15 1720  TSH 2.728   ------------------------------------------------------------------------------------------------------------------ No results for input(s): VITAMINB12, FOLATE, FERRITIN, TIBC, IRON, RETICCTPCT in the last 72 hours.  Coagulation profile  Recent Labs Lab 04/30/15 0920  INR 1.63*    No results for input(s): DDIMER in the last 72 hours.  Cardiac Enzymes No results for input(s): CKMB, TROPONINI, MYOGLOBIN in the last 168 hours.  Invalid input(s): CK ------------------------------------------------------------------------------------------------------------------ Invalid input(s): POCBNP   Recent Labs  04/30/15 0919 04/30/15 1715 04/30/15 2320 05/01/15 0022 05/01/15 0606 05/01/15 1208  GLUCAP 139* 123* 90 92 99 111*     Shizuo Biskup M.D. [facility] 05/01/2015, 2:05 PM  Pager: [phone]   Between 7am to 7pm - call Pager - [phone]  After 7pm go to [email or web address] - password [email or web address]  Call night coverage person covering after 7pm

## 2015-05-01 NOTE — Progress Notes (Signed)
Abd noted to be more distended that shift assessment, MD paged, returned call. Bladder scanned for 240 ml urine. MD returned paged. RRP paged.

## 2015-05-01 NOTE — Evaluation (Signed)
Physical Therapy Evaluation Patient Details Name: [name] NakaiMichael L [name] MRN: [id number] DOB: 1946-05-13 Today's Date: 05/01/2015   History of Present Illness  69 y.o. year-old male with history of DM type 2, HTN, HLD, cirrhosis, GERD, rectal bleeding, hip fracture who presents with confusion. He has had 3 hospitalizations in the last 5 months. He underwent LEFT hip fracture s/p repair in January 2016 followed by RIGHT hip fracture in April 2016, also surgically repaired. He was hospitalized again from 5/7-5/10 with rectal bleeding and hemoglobin of 6 mg/dl. He underwent EGD with demonstrated a solitary gade 2 distal esophageal varix, some gastritis, an erosion in the duodenal bulb, and a couple of possible small AVMs in the proximal duodenum. Capsule endoscopy was performed and the results are pending. He was discharged to SNF. Due to swelling, he had his diuretics increased to lasix 40mg  daily and spironolaactone 50mg  BID. He developed urinary retention and had a foley catheter placed two days prior to admission. Around the same time, he developed slow speech, confusion, and lethargy.69 y.o. year-old male with history of DM type 2, HTN, HLD, cirrhosis, GERD, rectal bleeding, hip fracture who presents with confusion. He has had 3 hospitalizations in the last 5 months. He underwent LEFT hip fracture s/p repair in January 2016 followed by RIGHT hip fracture in April 2016, also surgically repaired. He was hospitalized again from 5/7-5/10 with rectal bleeding and hemoglobin of 6 mg/dl. He underwent EGD with demonstrated a solitary gade 2 distal esophageal varix, some gastritis, an erosion in the duodenal bulb, and a couple of possible small AVMs in the proximal duodenum. Capsule endoscopy was performed and the results are pending. He was discharged to SNF. Due to swelling, he had his diuretics increased to lasix 40mg  daily and spironolaactone 50mg  BID. He developed urinary retention and had a foley  catheter placed two days prior to admission. Around the same time, he developed slow speech, confusion, and lethargy.  Clinical Impression  Pt able to tolerate sit<>stand and standing for approx 1 min with use of RW at min/mod A level.  Also able to pivot to bedside commode and back to bed at mod A.  Pt continues to have confusion, but seems to be close to baseline per niece that arrived at end of session.  Recommend continued therapy to address deficits.  Also [facility] continued therapy at D/C for improved safety and decreased burden of care.     Follow Up Recommendations SNF    Equipment Recommendations  None recommended by PT    Recommendations for Other Services       Precautions / Restrictions Precautions Precautions: Fall Restrictions Weight Bearing Restrictions: No RLE Weight Bearing: Weight bearing as tolerated      Mobility  Bed Mobility Overal bed mobility: Needs Assistance Bed Mobility: Supine to Sit     Supine to sit: Max assist Sit to supine: Max assist   General bed mobility comments: Pt requires heavy max A due to tendency to grab bed rails and prevent weight shift of trunk into sitting.   Transfers Overall transfer level: Needs assistance Equipment used: Rolling walker (2 wheeled) Transfers: Sit to/from [employer];Stand Pivot Transfers Sit to Stand: Mod assist Stand pivot transfers: Mod assist       General transfer comment: Requires assist for elevating from bed with tactile and verbal cues on sequencing during transfer.  Pt tends to take Pinkard shuffled steps throughout session.   Ambulation/Gait Ambulation/Gait assistance:  (not assessed for safety)  Stairs            Wheelchair Mobility    Modified Rankin (Stroke Patients Only)       Balance Overall balance assessment: History of Falls;Needs assistance Sitting-balance support: Bilateral upper extremity supported Sitting balance-Leahy Scale:  Poor Sitting balance - Comments: Relies heavily on UEs to support balance otherwise would lose balance to the L.  Postural control: Left lateral lean;Posterior lean Standing balance support: Bilateral upper extremity supported Standing balance-Leahy Scale: Poor Standing balance comment: Relies on UE support on RW but still still moderate L lateral lean                             Pertinent Vitals/Pain Pain Assessment: Faces Pain Score: 6  Pain Location: buttocks Pain Intervention(s): Repositioned    Home Living Family/patient expects to be discharged to:: Skilled nursing facility                      Prior Function Level of Independence: Needs assistance   Gait / Transfers Assistance Needed: ambulated with RW and assist at SNF for rehab     Comments: Per [facility], he was able to stand and pivot with RW and was working with therapy on gait with assist     Hand Dominance        Extremity/Trunk Assessment               Lower Extremity Assessment: Generalized weakness;LLE deficits/detail RLE Deficits / Details: limited due to pain, however was able to stand without s/s of pain LLE Deficits / Details: Pt with moderate amount of internal rotation of L LE in sitting with very narrow BOS, unable to correct without pain.   Cervical / Trunk Assessment: Other exceptions  Communication   Communication: Expressive difficulties  Cognition Arousal/Alertness: Awake/alert Behavior During Therapy: WFL for tasks assessed/performed Overall Cognitive Status: History of cognitive impairments - at baseline                      General Comments General comments (skin integrity, edema, etc.): Pt with several areas of skin break down on buttocks, nurse tech applied barrier cream during session.     Exercises        Assessment/Plan    PT Assessment Patient needs continued PT services  PT Diagnosis Abnormality of gait;Generalized weakness;Acute pain    PT Problem List Decreased strength;Decreased mobility;Decreased balance;Decreased knowledge of use of DME;Decreased activity tolerance;Decreased range of motion;Decreased skin integrity  PT Treatment Interventions DME instruction;Therapeutic exercise;Gait training;Balance training;Functional mobility training;Therapeutic activities;Patient/family education   PT Goals (Current goals can be found in the Care Plan section) Acute Rehab PT Goals Patient Stated Goal: n/a PT Goal Formulation: Patient unable to participate in goal setting Time For Goal Achievement: 05/08/15 Potential to Achieve Goals: Fair    Frequency Min 2X/week   Barriers to discharge        Co-evaluation               End of Session   Activity Tolerance: Patient tolerated treatment well Patient left: in bed;with call bell/phone within reach;with bed alarm set;with family/visitor present Nurse Communication: Mobility status         Time: [id number] PT Time Calculation (min) (ACUTE ONLY): 27 min   Charges:   PT Evaluation $Initial PT Evaluation Tier I: 1 Procedure PT Treatments $Therapeutic Activity: 23-37 mins   PT G Codes:  Vista Deck 05/01/2015, 3:08 PM

## 2015-05-01 NOTE — Clinical Social Work Note (Addendum)
Clinical Social Work Assessment  Patient Details  Name: [name] NakaiMichael L Huy MRN: [id number] Date of Birth: 08-Jun-1946  Date of referral:  05/01/15               Reason for consult:  Discharge Planning                Permission sought to share information with:    Permission granted to share information::     Name::        Agency::     Relationship::     Contact Information:     Housing/Transportation Living arrangements for the past 2 months:  Skilled Nursing Facility Source of Information:  Facility Patient Interpreter Needed:  None Criminal Activity/Legal Involvement Pertinent to Current Situation/Hospitalization:  No - Comment as needed Significant Relationships:  Siblings Lives with:  Facility Resident Do you feel safe going back to the place where you live?  Yes Need for family participation in patient care:  No (Coment)  Care giving concerns:  CSW has made several attempts to reach patient's sister by phone. Messages have been left. The facility the patient was at PTA does not report any care giving concerns.   Social Worker assessment / plan:  CSW attempted to meet with the patient at bedside to complete assessment. Patient is very confused at this time. His sister Park [name] is not at bedside. CSW has left messages with [name]. CSW was able to gather collateral information from [facility]. Per [facility], the patient has been at their facility since his hip surgery for continued rehab. The facility states they will be happy to take the patient back once he is ready for DC. CSW will relay this information to [facility] once she returns CSW's call.  UPDATE: CSW received call from [name]. [name] voices concerns about patient being discharged [facility] to [facility] as she is unsure whether or not the facility can provide total care to the patient as he needs assistance with everything. CSW has offered to make referrals to other SNFs but [name] does not seem interested in  this option. CSW will make facility aware that patient needs assistance all ADLs. [name] requests to speak with MD. CSW has sent MD a page regarding this.   Employment status:  Disabled (Comment on whether or not currently receiving Disability) Insurance information:  Medicare PT Recommendations:  Not assessed at this time Information / Referral to community resources:  Skilled Nursing Facility  Patient/Family's Response to care:  Unable to assess  Patient/Family's Understanding of and Emotional Response to Diagnosis, Current Treatment, and Prognosis:  Unable to assess.  Emotional Assessment Appearance:  Appears stated age Attitude/Demeanor/Rapport:  Other (Patient is very confused) Affect (typically observed):  Other (Patient is very confused) Orientation:  Oriented to Self Alcohol / Substance use:  Tobacco Use (Hx of) Psych involvement (Current and /or in the community):  No (Comment)  Discharge Needs  Concerns to be addressed:  Discharge Planning Concerns Readmission within the last 30 days:  Yes Current discharge risk:  Cognitively Impaired, Physical Impairment Barriers to Discharge:  Continued Medical Work up   Venita Lickampbell, [name] B, LCSW 05/01/2015, 11:57 AM

## 2015-05-01 NOTE — Progress Notes (Signed)
Initial Nutrition Assessment  DOCUMENTATION CODES:  Severe malnutrition in context of acute illness/injury  INTERVENTION:  Ensure Enlive (each supplement provides 350kcal and 20 grams of protein), Prostat BID  NUTRITION DIAGNOSIS:  Malnutrition related to acute illness as evidenced by moderate depletion of body fat, moderate depletions of muscle mass, percent weight loss.  GOAL:  Patient will meet greater than or equal to 90% of their needs  MONITOR:  PO intake, Labs, Weight trends, I & O's, Skin  REASON FOR ASSESSMENT:  Malnutrition Screening Tool    ASSESSMENT: The patient is a 69 y.o. year-old male with history of DM type 2, HTN, HLD, cirrhosis, GERD, rectal bleeding, hip fracture who presents with confusion. He has had 3 hospitalizations in the last 5 months. He underwent LEFT hip fracture s/p repair in January 2016 followed by RIGHT hip fracture in April 2016, also surgically repaired. He was hospitalized again from 5/7-5/10 with rectal bleeding and hemoglobin of 6 mg/dl.  Pt rolling around in bed at time of visit, stating his legs are hurting. Pt answered some of simple questions, but was unable to obtain a detailed history. Per pt he lost weight recently (chart confirms 26 Lb loss in just over 3 months, 8%, significant for time frame). Pt's tray at bedside, untouched. Asked pt if he was hungry, pt replied "I am not sure". Ensure at bedside with about a third gone, pt states he likes them, order have been placed BID per [facility] protocol. Will add Prostat BID to help meet protein needs for healing. Will continue to monitor. Labs: Ca 8.5, Alb 1.9, Glu 106    Height:  Ht Readings from Last 1 Encounters:  04/19/15 5\' 6"  (1.676 m)    Weight:  Wt Readings from Last 1 Encounters:  04/30/15 174 lb 9.7 oz (79.2 kg)    Ideal Body Weight:  64.5 kg  Wt Readings from Last 10 Encounters:  04/30/15 174 lb 9.7 oz (79.2 kg)  04/19/15 166 lb (75.297 kg)  03/24/15 166 lb (75.297  kg)  02/11/15 160 lb (72.576 kg)  01/23/15 171 lb 6.4 oz (77.747 kg)  01/10/15 187 lb 2.7 oz (84.9 kg)  09/26/14 186 lb 1.6 oz (84.414 kg)  05/29/14 181 lb 11.2 oz (82.419 kg)  02/26/14 184 lb (83.462 kg)  01/22/14 184 lb (83.462 kg)    BMI:  Body mass index is 28.2 kg/(m^2).  Estimated Nutritional Needs:  Kcal:  1900 - 2100  Protein:  100 - 110 g  Fluid:  2.0 L  Skin:   Stage II pressure ulcer on buttocks, incision (boils)  Diet Order:  Diet Carb Modified Fluid consistency:: Thin; Room service appropriate?: Yes  EDUCATION NEEDS:  Education needs no appropriate at this time   Intake/Output Summary (Last 24 hours) at 05/01/15 1118 Last data filed at 05/01/15 0844  Gross per 24 hour  Intake      0 ml  Output   1900 ml  Net  -1900 ml    Last BM:  5/18 (watery)  Darris Carachure A. [facility] Diet[facility] Intern Pager: [phone] 05/01/2015 11:29 AM

## 2015-05-02 DIAGNOSIS — R339 Retention of urine, unspecified: Secondary | ICD-10-CM

## 2015-05-02 LAB — COMPREHENSIVE METABOLIC PANEL
ALBUMIN: 1.7 g/dL — AB (ref 3.5–5.0)
ALT: 22 U/L (ref 17–63)
AST: 51 U/L — ABNORMAL HIGH (ref 15–41)
Alkaline Phosphatase: 202 U/L — ABNORMAL HIGH (ref 38–126)
Anion gap: 4 — ABNORMAL LOW (ref 5–15)
BUN: 20 mg/dL (ref 6–20)
CALCIUM: 8 mg/dL — AB (ref 8.9–10.3)
CHLORIDE: 111 mmol/L (ref 101–111)
CO2: 23 mmol/L (ref 22–32)
Creatinine, Ser: 0.89 mg/dL (ref 0.61–1.24)
GFR calc non Af Amer: >60 mL/min (ref >60)
Glucose, Bld: 107 mg/dL — ABNORMAL HIGH (ref 65–99)
Potassium: 3.2 mmol/L — ABNORMAL LOW (ref 3.5–5.1)
SODIUM: 138 mmol/L (ref 135–145)
TOTAL PROTEIN: 6.7 g/dL (ref 6.5–8.1)
Total Bilirubin: 1.1 mg/dL (ref 0.3–1.2)

## 2015-05-02 LAB — GLUCOSE, CAPILLARY
GLUCOSE-CAPILLARY: 155 mg/dL — AB (ref 65–99)
Glucose-Capillary: 122 mg/dL — ABNORMAL HIGH (ref 65–99)
Glucose-Capillary: 146 mg/dL — ABNORMAL HIGH (ref 65–99)

## 2015-05-02 LAB — CBC
HEMATOCRIT: 25.7 % — AB (ref 39.0–52.0)
Hemoglobin: 8.3 g/dL — ABNORMAL LOW (ref 13.0–17.0)
MCH: 28.1 pg (ref 26.0–34.0)
MCHC: 32.3 g/dL (ref 30.0–36.0)
MCV: 87.1 fL (ref 78.0–100.0)
Platelets: 112 10*3/uL — ABNORMAL LOW (ref 150–400)
RBC: 2.95 MIL/uL — ABNORMAL LOW (ref 4.22–5.81)
RDW: 16.8 % — AB (ref 11.5–15.5)
WBC: 2.9 10*3/uL — AB (ref 4.0–10.5)

## 2015-05-02 LAB — AMMONIA: AMMONIA: 53 umol/L — AB (ref 9–35)

## 2015-05-02 LAB — PATHOLOGIST SMEAR REVIEW

## 2015-05-02 MED ORDER — LACTULOSE 10 GM/15ML PO SOLN: 10.0000 g | Freq: Two times a day (BID) | ORAL | Status: DC

## 2015-05-02 MED ORDER — INSULIN ASPART 100 UNIT/ML ~~LOC~~ SOLN: 0.0000 [IU] | Freq: Three times a day (TID) | SUBCUTANEOUS | Status: DC

## 2015-05-02 MED ORDER — LACTULOSE 10 GM/15ML PO SOLN
10.0000 g | Freq: Two times a day (BID) | ORAL | Status: DC
  Administered 2015-05-02: 10 g via ORAL
  Filled 2015-05-02 (×2): qty 15

## 2015-05-02 MED ORDER — FLUOXETINE HCL 20 MG PO CAPS: 60.0000 mg | ORAL_CAPSULE | Freq: Every day | ORAL | Status: DC

## 2015-05-02 MED ORDER — INSULIN GLARGINE 100 UNIT/ML ~~LOC~~ SOLN: 5.0000 [IU] | Freq: Every day | SUBCUTANEOUS | Status: DC

## 2015-05-02 MED ORDER — BARRIER CREAM NON-SPECIFIED
1.0000 "application " | TOPICAL_CREAM | Freq: Two times a day (BID) | TOPICAL | Status: DC
  Administered 2015-05-02: 1 via TOPICAL
  Filled 2015-05-02: qty 1

## 2015-05-02 MED ORDER — TRAZODONE HCL 50 MG PO TABS: 50.0000 mg | ORAL_TABLET | Freq: Every evening | ORAL | Status: DC | PRN

## 2015-05-02 MED ORDER — ENSURE ENLIVE PO LIQD: 237.0000 mL | Freq: Two times a day (BID) | ORAL | Status: DC

## 2015-05-02 MED ORDER — BARRIER CREAM NON-SPECIFIED
1.0000 "application " | TOPICAL_CREAM | Freq: Two times a day (BID) | TOPICAL | Status: DC | PRN
  Filled 2015-05-02: qty 1

## 2015-05-02 MED ORDER — PRO-STAT SUGAR FREE PO LIQD: 30.0000 mL | Freq: Two times a day (BID) | ORAL | Status: DC

## 2015-05-02 MED ORDER — DOXYCYCLINE HYCLATE 100 MG PO TABS: 100.0000 mg | ORAL_TABLET | Freq: Two times a day (BID) | ORAL | Status: DC

## 2015-05-02 MED ORDER — DOXYCYCLINE HYCLATE 100 MG PO TABS
100.0000 mg | ORAL_TABLET | Freq: Two times a day (BID) | ORAL | Status: DC
Start: 2015-05-02 — End: 2015-05-02
  Administered 2015-05-02: 100 mg via ORAL
  Filled 2015-05-02 (×2): qty 1

## 2015-05-02 MED ORDER — BARRIER CREAM NON-SPECIFIED: 1.0000 "application " | TOPICAL_CREAM | Freq: Two times a day (BID) | TOPICAL | Status: DC

## 2015-05-02 NOTE — Progress Notes (Signed)
MD Rai paged, patient has new skin rash (red and swollen) between legs, asked for her to come to bedside to evaluate prior to patient discharge.

## 2015-05-02 NOTE — Progress Notes (Signed)
NURSING PROGRESS NOTE  [name] NakaiMichael L [name] [id number] Discharge Data: 05/02/2015 4:36 PM Attending Provider: Cathren Harshipudeep K Rai, MD WUJ:[id number]:[name], Randon GoldsmithANNE D, MD  2 [name] NakaiMichael L [name] to be D/C'd Skilled nursing facility per MD order via EMS all paperwork given to EMS staff.    All IV's will be discontinued and monitored for bleeding.  All belongings will be returned to patient for patient to take home.  Last Documented Vital Signs:  Blood pressure 119/58, pulse 80, temperature 97.9 F (36.6 C), temperature source Oral, resp. rate 16, height 5\' 6"  (1.676 m), weight 79.2 kg (174 lb 9.7 oz), SpO2 100 %.  [name] PlattSpencer Malcolm Quast RN, BS, BSN

## 2015-05-02 NOTE — Progress Notes (Signed)
Spoke with [name] PaceBryant LCSW whom stated he needs to touch base with the patient's sister, and then [facility].

## 2015-05-02 NOTE — Clinical Social Work Note (Signed)
CSW received call from RN. RN concerned that facility might not change patient as much as needed which could worsen the patient's rash and skin breakdown. CSW has spoken with [facility] admission coordinator who states the facility is capable of caring for the patient. The admission coordinator has informed that the [facility] has been made aware of the patient's specific care needs.   Per MD patient ready [facility]. RN, patient/family [name] Meo([name]), and facility notified of patient's DC. RN given number for report. DC packet on patient's chart. Ambulance transport requested for patient for next available transport ([facility] states there will be a wait). CSW signing off at this time.    [name] MSW, [place]WA, [place], [phone]

## 2015-05-02 NOTE — Discharge Summary (Addendum)
Physician Discharge Summary   Patient ID: CHI GARLOW MRN: [id number] DOB/AGE: [birth year] 69 y.o.  Admit date: 04/30/2015 Discharge date: 05/02/2015  Primary Care Physician:  Margit Hanks, MD  Discharge Diagnoses:   . Liver cirrhosis . Hypertension . Acute encephalopathy  . Other pancytopenia . Hypothyroidism . Urinary retention . Type 1 diabetes mellitus with neurological manifestations, uncontrolled  Consults: none    Recommendations for Outpatient Follow-up:  Please titrate lactulose for goal 3-4 bowel movements in a day  TESTS THAT NEED FOLLOW-UP Cbc, CMET   DIET: heart healthy    Allergies:   Allergies  Allergen Reactions  . Penicillins Hives     Discharge Medications:   Medication List    STOP taking these medications        acetaminophen 500 MG tablet  Commonly known as:  TYLENOL     aspirin 325 MG EC tablet     HYDROcodone-acetaminophen 5-325 MG per tablet  Commonly known as:  NORCO/VICODIN     magnesium hydroxide 400 MG/5ML suspension  Commonly known as:  MILK OF MAGNESIA     Melatonin 10 MG Tabs     MELATONIN PO     RA SALINE ENEMA RE     UNABLE TO FIND      TAKE these medications        barrier cream Crea  Commonly known as:  non-specified  Apply 1 application topically 2 (two) times daily. For the rash in perineal area until resolved, then BID as needed     bisacodyl 10 MG suppository  Commonly known as:  DULCOLAX  Place 10 mg rectally as needed for moderate constipation.     doxycycline 100 MG tablet  Commonly known as:  VIBRA-TABS  Take 1 tablet (100 mg total) by mouth 2 (two) times daily. X 7days     feeding supplement (ENSURE ENLIVE) Liqd  Take 237 mLs by mouth 2 (two) times daily between meals.     feeding supplement (PRO-STAT SUGAR FREE 64) Liqd  Take 30 mLs by mouth daily.     feeding supplement (PRO-STAT SUGAR FREE 64) Liqd  Take 30 mLs by mouth 2 (two) times daily between meals.     ferrous sulfate  325 (65 FE) MG tablet  Take 325 mg by mouth daily with breakfast.     FLUoxetine 20 MG capsule  Commonly known as:  PROZAC  Take 3 capsules (60 mg total) by mouth daily.     furosemide 20 MG tablet  Commonly known as:  LASIX  Take 40 mg by mouth daily.     glucose blood test strip  1 each by Other route 4 (four) times daily -  before meals and at bedtime. Use as instructed     hydrocortisone 2.5 % rectal cream  Commonly known as:  ANUSOL-HC  Place rectally 2 (two) times daily as needed for hemorrhoids.     insulin aspart 100 UNIT/ML injection  Commonly known as:  novoLOG  Inject 0-9 Units into the skin 3 (three) times daily with meals. Sliding scale CBG 70 - 120: 0 units CBG 121 - 150: 1 unit,  CBG 151 - 200: 2 units,  CBG 201 - 250: 3 units,  CBG 251 - 300: 5 units,  CBG 301 - 350: 7 units,  CBG 351 - 400: 9 units   CBG > 400: 9 units and notify your MD     insulin glargine 100 UNIT/ML injection  Commonly known as:  LANTUS  Inject  0.05 mLs (5 Units total) into the skin at bedtime.     lactulose 10 GM/15ML solution  Commonly known as:  CHRONULAC  Take 15 mLs (10 g total) by mouth 2 (two) times daily. Titrate for goal 3-4 BM's a day     lamoTRIgine 25 MG tablet  Commonly known as:  LAMICTAL  Take 50 mg by mouth 2 (two) times daily.     levothyroxine 25 MCG tablet  Commonly known as:  SYNTHROID, LEVOTHROID  Take 25 mcg by mouth daily.     meclizine 25 MG tablet  Commonly known as:  ANTIVERT  Take 25 mg by mouth daily.     multivitamin with minerals Tabs tablet  Take 1 tablet by mouth daily.     pantoprazole 40 MG tablet  Commonly known as:  PROTONIX  Take 1 tablet (40 mg total) by mouth 2 (two) times daily.     phenylephrine-shark liver oil-mineral oil-petrolatum 0.25-3-14-71.9 % rectal ointment  Commonly known as:  PREPARATION H  Place 1 application rectally 2 (two) times daily as needed for hemorrhoids.     polyethylene glycol packet  Commonly known as:  MIRALAX  / GLYCOLAX  Take 17 g by mouth daily as needed for mild constipation.     promethazine 25 MG tablet  Commonly known as:  PHENERGAN  Take 25 mg by mouth every 6 (six) hours as needed for nausea or vomiting.     simvastatin 40 MG tablet  Commonly known as:  ZOCOR  Take 40 mg by mouth at bedtime.     sodium chloride 0.65 % Soln nasal spray  Commonly known as:  OCEAN  Place 1 spray into both nostrils 3 (three) times daily as needed (for nosebleed).     spironolactone 25 MG tablet  Commonly known as:  ALDACTONE  Take 50 mg by mouth 2 (two) times daily.     tamsulosin 0.4 MG Caps capsule  Commonly known as:  FLOMAX  Take 0.4 mg by mouth daily.     traZODone 50 MG tablet  Commonly known as:  DESYREL  Take 1 tablet (50 mg total) by mouth at bedtime as needed for sleep.     Vitamin D3 1000 UNITS Caps  Take 1,000 Units by mouth every morning.         Brief H and P: For complete details please refer to admission H and P, but in brief The patient is a 69 y.o. year-old male with history of DM type 2, HTN, HLD, cirrhosis, GERD, rectal bleeding, hip fracture who presents with confusion. He has had 3 hospitalizations in the last 5 months. He underwent LEFT hip fracture s/p repair in January 2016 followed by RIGHT hip fracture in April 2016, also surgically repaired. He was hospitalized again from 5/7-5/10 with rectal bleeding and hemoglobin of 6 mg/dl. He underwent EGD with demonstrated a solitary gade 2 distal esophageal varix, some gastritis, an erosion in the duodenal bulb, and a couple of possible small AVMs in the proximal duodenum. Capsule endoscopy was performed and the results are pending. He was discharged to SNF. Due to swelling, he had his diuretics increased to lasix 40mg  daily and spironolaactone 50mg  BID. He developed urinary retention and had a foley catheter placed two days prior to admission. Around the same time, he developed slow speech, confusion, and lethargy. He has not had  fevers. Patient is unable to contribute to history due to confusion and lethargy.  In the ER, VS stable. Labs were notable  for WBC 3.2, hgb 8.9, platelets 120, CO2 21, BUN 22, INR 1.63. CT head no acute findings, stable compared to January. Chest x-ray with low lung volumes and probable edema, difficult to exclude pneumonia. Urinalysis with 7-10 day VPCs, small leukocyte esterase and negative nitrites. Ammonia level 103. He had a diagnostic paracentesis, glucose is 142, LDH 53, total protein less than 3. He was given vancomycin, Zosyn, and lactulose in the emergency department.   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 He was noticed to have ammonia level of 103 at the time of admission. Initially due to acute encephalopathy, patient required rectal lactulose enemas and has been transitioned to oral lactulose. He underwent diagnostic paracentesis which was negative for SBP. Blood cultures and ascitic fluid cultures has remained negative. He also underwent EEG which showed slowing appeared to be consistent with normal sleep cannot rule out possibility of slowing due to general cerebral disturbance such as metabolic encephalopathy. No epileptiform activity was noted. Please titrate lactulose with goal 3-4 bowel movements in a day.   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 Please keep the area clean.    Day of Discharge BP 119/58 mmHg  Pulse 80  Temp(Src) 97.9 F (36.6 C) (Oral)  Resp 16  Ht  (1.676 m)  Wt 79.2 kg (174 lb 9.7 oz)  SpO2 100%  Physical Exam: General: Alert and awake oriented x3 not in any acute distress. HEENT: anicteric sclera, pupils reactive to light and accommodation CVS: S1-S2 clear no murmur rubs or gallops Chest: clear to auscultation bilaterally, no wheezing rales or rhonchi Abdomen: soft nontender, +distended, normal bowel sounds Extremities: no cyanosis, clubbing or edema noted bilaterally Neuro: Cranial nerves II-XII intact, no focal neurological deficits   The results of significant diagnostics from this hospitalization (including imaging, microbiology, ancillary and laboratory) are listed below for reference.    LAB RESULTS: Basic Metabolic Panel:  Recent Labs Lab 05/01/15 0745 05/02/15 0359  NA 142 138  K 3.6 3.2*  CL 112* 111  CO2 21* 23  GLUCOSE 106* 107*  BUN 16 20  CREATININE 0.95 0.89  CALCIUM 8.5* 8.0*   Liver Function Tests:  Recent Labs Lab 05/01/15 0745 05/02/15 0359  AST 59* 51*  ALT 23 22  ALKPHOS 226* 202*  BILITOT 1.5* 1.1  PROT 7.4 6.7  ALBUMIN 1.9* 1.7*   No results for input(s): LIPASE, AMYLASE in the last 168 hours.  Recent Labs Lab 05/01/15 0745 05/02/15 0359  AMMONIA 28 53*   CBC:  Recent Labs Lab 04/30/15 0920 05/01/15 0745 05/02/15 0359  WBC 3.2* 3.4* 2.9*  NEUTROABS 2.5  --   --   HGB 8.9* 9.1* 8.3*  HCT 27.5* 28.5* 25.7*  MCV 87.3 88.2 87.1  PLT 120* 121* 112*   Cardiac Enzymes: No results for input(s): CKTOTAL, CKMB, CKMBINDEX, TROPONINI in the last  168 hours. BNP: Invalid input(s): POCBNP CBG:  Recent Labs Lab 05/02/15 0454 05/02/15 1159  GLUCAP 122* 155*    Significant Diagnostic Studies:  Ct Head Wo  Contrast  04/30/2015   CLINICAL DATA:  Altered mental status. Patient lives at assisted living facility. No reported episodes of trauma.  EXAM: CT HEAD WITHOUT CONTRAST  TECHNIQUE: Contiguous axial images were obtained from the base of the skull through the vertex without intravenous contrast.  COMPARISON:  CT head 01/07/2015.  FINDINGS: No evidence for acute infarction, hemorrhage, mass lesion, hydrocephalus, or extra-axial fluid. Mild cerebral and cerebellar atrophy. Hypoattenuation of white matter, likely small vessel disease. No CT signs of large vessel occlusion. Calvarium intact. No acute sinus or mastoid disease.  IMPRESSION: No acute intracranial findings. Stable appearance compared with January.   Electronically Signed   By: Davonna BellingJohn  Curnes M.D.   On: 04/30/2015 11:52   Dg Chest Port 1 View  04/30/2015   CLINICAL DATA:  Altered mental status, elevated ammonia levels.  EXAM: PORTABLE CHEST - 1 VIEW  COMPARISON:  03/24/2015.  FINDINGS: Trachea is midline. Heart size stable. Lungs are low in volume with diffuse mixed interstitial and airspace opacification. No pleural fluid.  IMPRESSION: Low lung volumes with probable edema. Difficult to exclude pneumonia.   Electronically Signed   By: Leanna BattlesMelinda  Blietz M.D.   On: 04/30/2015 10:18   Dg Abd Portable 1v  05/01/2015   CLINICAL DATA:  Abdominal distention.  EXAM: PORTABLE ABDOMEN - 1 VIEW  COMPARISON:  CT 01/07/2015  FINDINGS: There is air throughout normal caliber small bowel loops in the central abdomen. No bowel dilatation. Small amount of air in the distal colon, no findings of constipation. No radiopaque calculi. There are vascular calcifications. Pelvic phleboliths are noted. Postsurgical change of both proximal femurs. No evidence of free air on this single portable view.  IMPRESSION: Air throughout normal caliber small bowel loops, may be normal for this patient versus mild ileus. No obstruction.   Electronically Signed   By: Rubye OaksMelanie  Ehinger M.D.   On:  05/01/2015 03:05    2D ECHO:   Disposition and Follow-up: Discharge Instructions    Diet Carb Modified    Complete by:  As directed      Discharge instructions    Complete by:  As directed   Discharge diet: carb modified, thin liquids     Increase activity slowly    Complete by:  As directed             DISPOSITION: SNF   DISCHARGE FOLLOW-UP Follow-up Information    Follow up with Margit HanksALEXANDER, [name], MD. Schedule an appointment as soon as possible for a visit on 05/12/2015.   Specialty:  Internal Medicine   Why:  for hospital follow-up. Office stated that once patient is discharged back to SNF, patient will be placed on a rotation list to be seen by the doctors.   Contact information:   [address] [place] [id number] [phone]        Time spent on Discharge:  35 minutes  Signed:   Rolly Magri M.D. [facility] 05/02/2015, 2:25 PM Pager: [phone]

## 2015-05-03 LAB — BODY FLUID CULTURE: Culture: NO GROWTH

## 2015-05-05 ENCOUNTER — Encounter: Payer: Self-pay | Admitting: Internal Medicine

## 2015-05-05 ENCOUNTER — Non-Acute Institutional Stay (SKILLED_NURSING_FACILITY): Payer: Medicare Other | Admitting: Internal Medicine

## 2015-05-05 DIAGNOSIS — E1141 Type 2 diabetes mellitus with diabetic mononeuropathy: Secondary | ICD-10-CM | POA: Diagnosis not present

## 2015-05-05 DIAGNOSIS — IMO0002 Reserved for concepts with insufficient information to code with codable children: Secondary | ICD-10-CM

## 2015-05-05 DIAGNOSIS — G9340 Encephalopathy, unspecified: Secondary | ICD-10-CM | POA: Diagnosis not present

## 2015-05-05 DIAGNOSIS — D61818 Other pancytopenia: Secondary | ICD-10-CM

## 2015-05-05 DIAGNOSIS — E1165 Type 2 diabetes mellitus with hyperglycemia: Secondary | ICD-10-CM

## 2015-05-05 DIAGNOSIS — D638 Anemia in other chronic diseases classified elsewhere: Secondary | ICD-10-CM | POA: Diagnosis not present

## 2015-05-05 DIAGNOSIS — K219 Gastro-esophageal reflux disease without esophagitis: Secondary | ICD-10-CM | POA: Diagnosis not present

## 2015-05-05 DIAGNOSIS — E1149 Type 2 diabetes mellitus with other diabetic neurological complication: Secondary | ICD-10-CM

## 2015-05-05 DIAGNOSIS — I10 Essential (primary) hypertension: Secondary | ICD-10-CM | POA: Diagnosis not present

## 2015-05-05 DIAGNOSIS — K7031 Alcoholic cirrhosis of liver with ascites: Secondary | ICD-10-CM | POA: Diagnosis not present

## 2015-05-05 NOTE — Assessment & Plan Note (Signed)
Continue protonix  

## 2015-05-05 NOTE — Assessment & Plan Note (Signed)
mellitus with neurological manifestations, uncontrolled - Placed on carb modified diet, follow sliding scale insulin

## 2015-05-05 NOTE — Assessment & Plan Note (Signed)
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 He was noticed to have ammonia level of 103 at the time of admission. Initially due to acute encephalopathy, patient required rectal lactulose enemas and has been transitioned to oral lactulose. He underwent diagnostic paracentesis which was negative for SBP. Blood cultures and ascitic fluid cultures has remained negative. He also underwent EEG which showed slowing appeared to be consistent with normal sleep cannot rule out possibility of slowing due to general cerebral disturbance such as metabolic encephalopathy. No epileptiform activity was noted. Please titrate lactulose with goal 3-4 bowel movements in a day.

## 2015-05-05 NOTE — Assessment & Plan Note (Signed)
Causing elevated ammonia, bone marrow suppression and edema with ascites - very symptomatic now

## 2015-05-05 NOTE — Assessment & Plan Note (Signed)
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

## 2015-05-05 NOTE — Assessment & Plan Note (Signed)
Continue lasix and spironolactone 

## 2015-05-05 NOTE — Assessment & Plan Note (Signed)
Chronic likely due to alcohol cirrhosis, follow counts closely

## 2015-05-05 NOTE — Progress Notes (Addendum)
MRN: [id number] Name: [name]  Sex: male Age: 69 y.o. DOB: February 01, 1946  [facility] Facility/Room:306 Level Of Care: SNF Provider: Merrilee Seashore D Emergency Contacts: Extended Emergency Contact Information Primary Emergency Contact: [name],[name] Address: [address]          [place], [place] Work Phone: [phone] Mobile Phone: [phone] Relation: Sister  Code Status:DNR   Allergies: Penicillins  Chief Complaint  Patient presents with  . New Admit To SNF    HPI: Patient is 69 y.o. male who was hospitalized for MS change dx as hepatic encephalopathy readmitted to SNF.  Past Medical History  Diagnosis Date  . Hyperglycemia   . Hyponatremia   . DMII (diabetes mellitus, type 2)   . Depression   . IBS (irritable bowel syndrome)   . Chest pain   . Unspecified gastritis and gastroduodenitis without mention of hemorrhage   . Arthropathy, unspecified, site unspecified   . History of colon polyps   . Anemia, unspecified   . Anxiety   . GERD (gastroesophageal reflux disease)   . Hyperlipidemia   . Hypertension   . Liver problem     Past Surgical History  Procedure Laterality Date  . Hernia repair  1964  . Intramedullary (im) nail intertrochanteric Left 01/07/2015    Procedure: INTRAMEDULLARY (IM) NAIL INTERTROCHANTRIC;  Surgeon: Kathryne Hitch, MD;  Location: [facility];  Service: Orthopedics;  Laterality: Left;  . Femur im nail Right 03/24/2015    Procedure: CLOSED REDUCTION INTERNAL FIXATION;  Surgeon: Kerrin Champagne, MD;  Location: [facility] OR;  Service: Orthopedics;  Laterality: Right;  . Esophagogastroduodenoscopy N/A 04/21/2015    Procedure: ESOPHAGOGASTRODUODENOSCOPY (EGD);  Surgeon: Willis Modena, MD;  Location: [facility] ENDOSCOPY;  Service: Endoscopy;  Laterality: N/A;  . Givens capsule study N/A 04/21/2015    Procedure: GIVENS CAPSULE STUDY;  Surgeon: Willis Modena, MD;  Location: [facility] ENDOSCOPY;  Service: Endoscopy;   Laterality: N/A;  . Fracture surgery        Medication List       This list is accurate as of: 05/05/15  7:51 PM.  Always use your most recent med list.               barrier cream Crea  Commonly known as:  non-specified  Apply 1 application topically 2 (two) times daily. For the rash in perineal area until resolved, then BID as needed     bisacodyl 10 MG suppository  Commonly known as:  DULCOLAX  Place 10 mg rectally as needed for moderate constipation.     doxycycline 100 MG tablet  Commonly known as:  VIBRA-TABS  Take 1 tablet (100 mg total) by mouth 2 (two) times daily. X 7days     feeding supplement (ENSURE ENLIVE) Liqd  Take 237 mLs by mouth 2 (two) times daily between meals.     feeding supplement (PRO-STAT SUGAR FREE 64) Liqd  Take 30 mLs by mouth daily.     feeding supplement (PRO-STAT SUGAR FREE 64) Liqd  Take 30 mLs by mouth 2 (two) times daily between meals.     ferrous sulfate 325 (65 FE) MG tablet  Take 325 mg by mouth daily with breakfast.     FLUoxetine 20 MG capsule  Commonly known as:  PROZAC  Take 3 capsules (60 mg total) by mouth daily.     furosemide 20 MG tablet  Commonly known as:  LASIX  Take 40 mg by mouth daily.  glucose blood test strip  1 each by Other route 4 (four) times daily -  before meals and at bedtime. Use as instructed     hydrocortisone 2.5 % rectal cream  Commonly known as:  ANUSOL-HC  Place rectally 2 (two) times daily as needed for hemorrhoids.     insulin aspart 100 UNIT/ML injection  Commonly known as:  novoLOG  Inject 0-9 Units into the skin 3 (three) times daily with meals. Sliding scale CBG 70 - 120: 0 units CBG 121 - 150: 1 unit,  CBG 151 - 200: 2 units,  CBG 201 - 250: 3 units,  CBG 251 - 300: 5 units,  CBG 301 - 350: 7 units,  CBG 351 - 400: 9 units   CBG > 400: 9 units and notify your MD     insulin glargine 100 UNIT/ML injection  Commonly known as:  LANTUS  Inject 0.05 mLs (5 Units total) into the skin at  bedtime.     lactulose 10 GM/15ML solution  Commonly known as:  CHRONULAC  Take 15 mLs (10 g total) by mouth 2 (two) times daily. Titrate for goal 3-4 BM's a day     lamoTRIgine 25 MG tablet  Commonly known as:  LAMICTAL  Take 50 mg by mouth 2 (two) times daily.     levothyroxine 25 MCG tablet  Commonly known as:  SYNTHROID, LEVOTHROID  Take 25 mcg by mouth daily.     meclizine 25 MG tablet  Commonly known as:  ANTIVERT  Take 25 mg by mouth daily.     multivitamin with minerals Tabs tablet  Take 1 tablet by mouth daily.     pantoprazole 40 MG tablet  Commonly known as:  PROTONIX  Take 1 tablet (40 mg total) by mouth 2 (two) times daily.     phenylephrine-shark liver oil-mineral oil-petrolatum 0.25-3-14-71.9 % rectal ointment  Commonly known as:  PREPARATION H  Place 1 application rectally 2 (two) times daily as needed for hemorrhoids.     polyethylene glycol packet  Commonly known as:  MIRALAX / GLYCOLAX  Take 17 g by mouth daily as needed for mild constipation.     promethazine 25 MG tablet  Commonly known as:  PHENERGAN  Take 25 mg by mouth every 6 (six) hours as needed for nausea or vomiting.     simvastatin 40 MG tablet  Commonly known as:  ZOCOR  Take 40 mg by mouth at bedtime.     sodium chloride 0.65 % Soln nasal spray  Commonly known as:  OCEAN  Place 1 spray into both nostrils 3 (three) times daily as needed (for nosebleed).     spironolactone 25 MG tablet  Commonly known as:  ALDACTONE  Take 50 mg by mouth 2 (two) times daily.     tamsulosin 0.4 MG Caps capsule  Commonly known as:  FLOMAX  Take 0.4 mg by mouth daily.     traZODone 50 MG tablet  Commonly known as:  DESYREL  Take 1 tablet (50 mg total) by mouth at bedtime as needed for sleep.     Vitamin D3 1000 UNITS Caps  Take 1,000 Units by mouth every morning.        No orders of the defined types were placed in this encounter.    Immunization History  Administered Date(s) Administered   . Influenza-Unspecified 09/13/2011  . PPD Test 01/07/2014  . Pneumococcal Polysaccharide-23 04/22/2015  . Pneumococcal-Unspecified 12/13/2009  . Tdap 12/13/2009    History  Substance Use Topics  . Smoking status: Former Smoker -- 1.00 packs/day    Types: Cigarettes  . Smokeless tobacco: Not on file  . Alcohol Use: No    Family history is noncontributory    Review of Systems  DATA OBTAINED: from patient, nurse GENERAL:  no fevers, fatigue, appetite changes SKIN: No itching, rash or wounds EYES: No eye pain, redness, discharge EARS: No earache, tinnitus, change in hearing NOSE: No congestion, drainage or bleeding  MOUTH/THROAT: No mouth or tooth pain, No sore throat RESPIRATORY: No cough, wheezing, SOB CARDIAC: No chest pain, palpitations, lower extremity edema  GI: No abdominal pain, No N/V/D or constipation, No heartburn or reflux  GU: No dysuria, frequency or urgency, or incontinence  MUSCULOSKELETAL: No unrelieved bone/joint pain NEUROLOGIC: No headache, dizziness or focal weakness PSYCHIATRIC: No overt anxiety or sadness, No behavior issue.   Filed Vitals:   05/05/15 0951  BP: 120/77  Pulse: 68  Temp: 97.3 F (36.3 C)  Resp: 18    Physical Exam  GENERAL APPEARANCE: Alert, conversant,  No acute distress; sitting at nursing station in [facility], looks good SKIN: No diaphoresis rash HEAD: Normocephalic, atraumatic  EYES: Conjunctiva/lids clear. Pupils round, reactive. EOMs intact.  EARS: External exam WNL, canals clear. Hearing grossly normal.  NOSE: No deformity or discharge.  MOUTH/THROAT: Lips w/o lesions  RESPIRATORY: Breathing is even, unlabored. Lung sounds are clear   CARDIOVASCULAR: Heart RRR 3/6 systolic murmurs, no rubs or gallops. 1+ peripheral edema.   GASTROINTESTINAL: Abdomen is soft, non-tender,   w/ normal bowel sounds; abd not increased form prior GENITOURINARY: Bladder non tender, not distended  MUSCULOSKELETAL: No abnormal joints or  musculature NEUROLOGIC:  Cranial nerves 2-12 grossly intact. Moves all extremities  PSYCHIATRIC: pt always pleasant, more talkative today, no behavioral issues  Patient Active Problem List   Diagnosis Date Noted  . Anemia of chronic disease 05/05/2015  . Acute encephalopathy 05/01/2015  . Metabolic encephalopathy 04/30/2015  . Encounter for family conference without patient present 04/28/2015  . Edema of both legs 04/28/2015  . Acute blood loss anemia 04/19/2015  . GI bleed 04/19/2015  . Closed right hip fracture 03/24/2015  . Murmur, cardiac 03/24/2015  . Abnormal rhythmic movement of tongue 03/24/2015  . Liver cirrhosis 03/24/2015  . Closed intertrochanteric fracture of right hip 03/24/2015    Class: Acute  . Urinary retention 01/15/2015  . CKD (chronic kidney disease) stage 3, GFR 30-59 ml/min 01/15/2015  . Closed left hip fracture 01/07/2015  . Hip fracture, left 01/07/2015  . Hip fracture 01/07/2015  . Hypothyroidism 01/07/2015  . Nausea vomiting and diarrhea   . Generalized weakness 07/20/2013  . Other pancytopenia 02/23/2013  . Internal and external bleeding hemorrhoids 11/29/2012  . Pruritus ani 11/29/2012  . DM (diabetes mellitus), type 2 with complications 11/13/2012  . Type 2 diabetes mellitus with neurological manifestations, uncontrolled 01/24/2012  . Depression 01/24/2012  . Psoriasis 01/24/2012  . Macrocytic anemia   . Anxiety   . GERD (gastroesophageal reflux disease)   . Hyperlipidemia   . Hypertension     CBC    Component Value Date/Time   WBC 2.9* 05/02/2015 0359   WBC 3.1* 01/23/2015 1313   RBC 2.95* 05/02/2015 0359   RBC 3.32* 01/23/2015 1313   RBC 4.13* 09/27/2009 0545   HGB 8.3* 05/02/2015 0359   HGB 9.4* 01/23/2015 1313   HCT 25.7* 05/02/2015 0359   HCT 29.9* 01/23/2015 1313   PLT 112* 05/02/2015 0359   PLT 148 01/23/2015 1313  MCV 87.1 05/02/2015 0359   MCV 90.0 01/23/2015 1313   LYMPHSABS 0.4* 04/30/2015 0920   LYMPHSABS 0.4*  01/23/2015 1313   MONOABS 0.2 04/30/2015 0920   MONOABS 0.2 01/23/2015 1313   EOSABS 0.0 04/30/2015 0920   EOSABS 0.1 01/23/2015 1313   BASOSABS 0.0 04/30/2015 0920   BASOSABS 0.0 01/23/2015 1313    CMP     Component Value Date/Time   NA 138 05/02/2015 0359   NA 138 01/23/2015 1313   K 3.2* 05/02/2015 0359   K 4.6 01/23/2015 1313   CL 111 05/02/2015 0359   CO2 23 05/02/2015 0359   CO2 24 01/23/2015 1313   GLUCOSE 107* 05/02/2015 0359   GLUCOSE 224* 01/23/2015 1313   BUN 20 05/02/2015 0359   BUN 31.2* 01/23/2015 1313   CREATININE 0.89 05/02/2015 0359   CREATININE 1.2 01/23/2015 1313   CALCIUM 8.0* 05/02/2015 0359   CALCIUM 9.1 01/23/2015 1313   PROT 6.7 05/02/2015 0359   PROT 7.7 01/23/2015 1313   ALBUMIN 1.7* 05/02/2015 0359   ALBUMIN 2.7* 01/23/2015 1313   AST 51* 05/02/2015 0359   AST 57* 01/23/2015 1313   ALT 22 05/02/2015 0359   ALT 26 01/23/2015 1313   ALKPHOS 202* 05/02/2015 0359   ALKPHOS 144 01/23/2015 1313   BILITOT 1.1 05/02/2015 0359   BILITOT 0.90 01/23/2015 1313   GFRNONAA >60 05/02/2015 0359   GFRAA >60 05/02/2015 0359    Assessment and Plan  Acute encephalopathy likely due to 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 He was noticed to have ammonia level of 103 at the time of admission. Initially due to acute encephalopathy, patient required rectal lactulose enemas and has been transitioned to oral lactulose. He underwent diagnostic paracentesis which was negative for SBP. Blood cultures and ascitic fluid cultures has remained negative. He also underwent EEG which showed slowing appeared to be consistent with normal sleep cannot rule out possibility of slowing due to general cerebral disturbance such as metabolic encephalopathy. No epileptiform activity was noted. Please titrate lactulose with goal 3-4 bowel movements in a day.   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

## 2015-05-06 LAB — CULTURE, BLOOD (ROUTINE X 2)
CULTURE: NO GROWTH
Culture: NO GROWTH

## 2015-05-22 ENCOUNTER — Non-Acute Institutional Stay (SKILLED_NURSING_FACILITY): Payer: Medicare Other | Admitting: Internal Medicine

## 2015-05-22 DIAGNOSIS — Z710 Person encountering health services to consult on behalf of another person: Secondary | ICD-10-CM | POA: Diagnosis not present

## 2015-05-22 DIAGNOSIS — G9340 Encephalopathy, unspecified: Secondary | ICD-10-CM

## 2015-05-24 ENCOUNTER — Encounter: Payer: Self-pay | Admitting: Internal Medicine

## 2015-05-24 NOTE — Progress Notes (Signed)
MRN: [id number] Name: [name]  Sex: male Age: 69 y.o. DOB: 04/13/1946  [facility] #: [facility] Facility/Room:312 Level Of Care: SNF Provider: Inocencio Homes D Emergency Contacts: Extended Emergency Contact Information Primary Emergency Contact: [name],[name] Address: [place], [place] Work Phone: [phone] Mobile Phone: [phone] Relation: Sister  Code Status: DNR  Allergies: Penicillins  Chief Complaint  Patient presents with  . Acute Visit    HPI: Patient is 69 y.o. male who is being seen for mental status change with elevated ammonia level and with change to comfort care per pt's POA, his sister.  Past Medical History  Diagnosis Date  . Hyperglycemia   . Hyponatremia   . DMII (diabetes mellitus, type 2)   . Depression   . IBS (irritable bowel syndrome)   . Chest pain   . Unspecified gastritis and gastroduodenitis without mention of hemorrhage   . Arthropathy, unspecified, site unspecified   . History of colon polyps   . Anemia, unspecified   . Anxiety   . GERD (gastroesophageal reflux disease)   . Hyperlipidemia   . Hypertension   . Liver problem     Past Surgical History  Procedure Laterality Date  . Hernia repair  1964  . Intramedullary (im) nail intertrochanteric Left 01/07/2015    Procedure: INTRAMEDULLARY (IM) NAIL INTERTROCHANTRIC;  Surgeon: Mcarthur Rossetti, MD;  Location: [facility];  Service: Orthopedics;  Laterality: Left;  . Femur im nail Right 03/24/2015    Procedure: CLOSED REDUCTION INTERNAL FIXATION;  Surgeon: Jessy Oto, MD;  Location: [facility];  Service: Orthopedics;  Laterality: Right;  . Esophagogastroduodenoscopy N/A 04/21/2015    Procedure: ESOPHAGOGASTRODUODENOSCOPY (EGD);  Surgeon: Arta Silence, MD;  Location[facility] ENDOSCOPY;  Service: Endoscopy;  Laterality: N/A;  . Givens capsule study N/A 04/21/2015    Procedure: GIVENS CAPSULE STUDY;  Surgeon: Arta Silence, MD;  Location: [facility]  ENDOSCOPY;  Service: Endoscopy;  Laterality: N/A;  . Fracture surgery        Medication List       This list is accurate as of: 05/22/15 11:59 PM.  Always use your most recent med list.               barrier cream Crea  Commonly known as:  non-specified  Apply 1 application topically 2 (two) times daily. For the rash in perineal area until resolved, then BID as needed     bisacodyl 10 MG suppository  Commonly known as:  DULCOLAX  Place 10 mg rectally as needed for moderate constipation.     doxycycline 100 MG tablet  Commonly known as:  VIBRA-TABS  Take 1 tablet (100 mg total) by mouth 2 (two) times daily. X 7days     feeding supplement (ENSURE ENLIVE) Liqd  Take 237 mLs by mouth 2 (two) times daily between meals.     feeding supplement (PRO-STAT SUGAR FREE 64) Liqd  Take 30 mLs by mouth daily.     feeding supplement (PRO-STAT SUGAR FREE 64) Liqd  Take 30 mLs by mouth 2 (two) times daily between meals.     ferrous sulfate 325 (65 FE) MG tablet  Take 325 mg by mouth daily with breakfast.     FLUoxetine 20 MG capsule  Commonly known as:  PROZAC  Take 3 capsules (60 mg total) by mouth daily.     furosemide 20 MG tablet  Commonly known as:  LASIX  Take 40 mg  by mouth daily.     glucose blood test strip  1 each by Other route 4 (four) times daily -  before meals and at bedtime. Use as instructed     hydrocortisone 2.5 % rectal cream  Commonly known as:  ANUSOL-HC  Place rectally 2 (two) times daily as needed for hemorrhoids.     insulin aspart 100 UNIT/ML injection  Commonly known as:  novoLOG  Inject 0-9 Units into the skin 3 (three) times daily with meals. Sliding scale CBG 70 - 120: 0 units CBG 121 - 150: 1 unit,  CBG 151 - 200: 2 units,  CBG 201 - 250: 3 units,  CBG 251 - 300: 5 units,  CBG 301 - 350: 7 units,  CBG 351 - 400: 9 units   CBG > 400: 9 units and notify your MD     insulin glargine 100 UNIT/ML injection  Commonly known as:  LANTUS  Inject 0.05 mLs  (5 Units total) into the skin at bedtime.     lactulose 10 GM/15ML solution  Commonly known as:  CHRONULAC  Take 15 mLs (10 g total) by mouth 2 (two) times daily. Titrate for goal 3-4 BM's a day     lamoTRIgine 25 MG tablet  Commonly known as:  LAMICTAL  Take 50 mg by mouth 2 (two) times daily.     levothyroxine 25 MCG tablet  Commonly known as:  SYNTHROID, LEVOTHROID  Take 25 mcg by mouth daily.     meclizine 25 MG tablet  Commonly known as:  ANTIVERT  Take 25 mg by mouth daily.     multivitamin with minerals Tabs tablet  Take 1 tablet by mouth daily.     pantoprazole 40 MG tablet  Commonly known as:  PROTONIX  Take 1 tablet (40 mg total) by mouth 2 (two) times daily.     phenylephrine-shark liver oil-mineral oil-petrolatum 0.25-3-14-71.9 % rectal ointment  Commonly known as:  PREPARATION H  Place 1 application rectally 2 (two) times daily as needed for hemorrhoids.     polyethylene glycol packet  Commonly known as:  MIRALAX / GLYCOLAX  Take 17 g by mouth daily as needed for mild constipation.     promethazine 25 MG tablet  Commonly known as:  PHENERGAN  Take 25 mg by mouth every 6 (six) hours as needed for nausea or vomiting.     simvastatin 40 MG tablet  Commonly known as:  ZOCOR  Take 40 mg by mouth at bedtime.     sodium chloride 0.65 % Soln nasal spray  Commonly known as:  OCEAN  Place 1 spray into both nostrils 3 (three) times daily as needed (for nosebleed).     spironolactone 25 MG tablet  Commonly known as:  ALDACTONE  Take 50 mg by mouth 2 (two) times daily.     tamsulosin 0.4 MG Caps capsule  Commonly known as:  FLOMAX  Take 0.4 mg by mouth daily.     traZODone 50 MG tablet  Commonly known as:  DESYREL  Take 1 tablet (50 mg total) by mouth at bedtime as needed for sleep.     Vitamin D3 1000 UNITS Caps  Take 1,000 Units by mouth every morning.        No orders of the defined types were placed in this encounter.    Immunization History   Administered Date(s) Administered  . Influenza-Unspecified 09/13/2011  . PPD Test 01/07/2014  . Pneumococcal Polysaccharide-23 04/22/2015  . Pneumococcal-Unspecified 12/13/2009  .  Tdap 12/13/2009    History  Substance Use Topics  . Smoking status: Former Smoker -- 1.00 packs/day    Types: Cigarettes  . Smokeless tobacco: Not on file  . Alcohol Use: No    Review of Systems  DATA OBTAINED: from nurse, sister GENERAL:  no fevers, fatigue, appetite changes, awake and speaking SKIN: No itching, rash HEENT: No complaint RESPIRATORY: No cough, wheezing, SOB CARDIAC: No chest pain, palpitations, lower extremity edema  GI: No abdominal pain, No N/V/D or constipation, No heartburn or reflux  GU: No dysuria, frequency or urgency, or incontinence  MUSCULOSKELETAL: No unrelieved bone/joint pain NEUROLOGIC: No headache, dizziness  PSYCHIATRIC: No overt anxiety or sadness  Filed Vitals:   05/24/15 1923  BP: 140/70  Pulse: 85  Temp: 97 F (36.1 C)  Resp: 18    Physical Exam  GENERAL APPEARANCE: Alert, mod conversant, No acute distress  SKIN: No diaphoresis rash HEENT: Unremarkable RESPIRATORY: Breathing is even, unlabored. Lung sounds are clear   CARDIOVASCULAR: Heart RRR no murmurs, rubs or gallops. No peripheral edema  GASTROINTESTINAL: Abdomen is soft, non-tender, not distended w/ normal bowel sounds, large bowel movement in depends  GENITOURINARY: Bladder non tender, not distended  MUSCULOSKELETAL: No abnormal joints or musculature NEUROLOGIC: Cranial nerves 2-12 grossly intact PSYCHIATRIC:  no behavioral issues  Patient Active Problem List   Diagnosis Date Noted  . Anemia of chronic disease 05/05/2015  . Acute encephalopathy 05/01/2015  . Metabolic encephalopathy 82/50/0370  . Encounter for family conference without patient present 04/28/2015  . Edema of both legs 04/28/2015  . Acute blood loss anemia 04/19/2015  . GI bleed 04/19/2015  . Closed right hip fracture  03/24/2015  . Murmur, cardiac 03/24/2015  . Abnormal rhythmic movement of tongue 03/24/2015  . Liver cirrhosis 03/24/2015  . Closed intertrochanteric fracture of right hip 03/24/2015    Class: Acute  . Urinary retention 01/15/2015  . CKD (chronic kidney disease) stage 3, GFR 30-59 ml/min 01/15/2015  . Closed left hip fracture 01/07/2015  . Hip fracture, left 01/07/2015  . Hip fracture 01/07/2015  . Hypothyroidism 01/07/2015  . Nausea vomiting and diarrhea   . Generalized weakness 07/20/2013  . Other pancytopenia 02/23/2013  . Internal and external bleeding hemorrhoids 11/29/2012  . Pruritus ani 11/29/2012  . DM (diabetes mellitus), type 2 with complications 48/88/9169  . Type 2 diabetes mellitus with neurological manifestations, uncontrolled 01/24/2012  . Depression 01/24/2012  . Psoriasis 01/24/2012  . Macrocytic anemia   . Anxiety   . GERD (gastroesophageal reflux disease)   . Hyperlipidemia   . Hypertension     CBC    Component Value Date/Time   WBC 2.9* 05/02/2015 0359   WBC 3.1* 01/23/2015 1313   RBC 2.95* 05/02/2015 0359   RBC 3.32* 01/23/2015 1313   RBC 4.13* 09/27/2009 0545   HGB 8.3* 05/02/2015 0359   HGB 9.4* 01/23/2015 1313   HCT 25.7* 05/02/2015 0359   HCT 29.9* 01/23/2015 1313   PLT 112* 05/02/2015 0359   PLT 148 01/23/2015 1313   MCV 87.1 05/02/2015 0359   MCV 90.0 01/23/2015 1313   LYMPHSABS 0.4* 04/30/2015 0920   LYMPHSABS 0.4* 01/23/2015 1313   MONOABS 0.2 04/30/2015 0920   MONOABS 0.2 01/23/2015 1313   EOSABS 0.0 04/30/2015 0920   EOSABS 0.1 01/23/2015 1313   BASOSABS 0.0 04/30/2015 0920   BASOSABS 0.0 01/23/2015 1313    CMP     Component Value Date/Time   NA 138 05/02/2015 0359   NA 138 01/23/2015  1313   K 3.2* 05/02/2015 0359   K 4.6 01/23/2015 1313   CL 111 05/02/2015 0359   CO2 23 05/02/2015 0359   CO2 24 01/23/2015 1313   GLUCOSE 107* 05/02/2015 0359   GLUCOSE 224* 01/23/2015 1313   BUN 20 05/02/2015 0359   BUN 31.2* 01/23/2015  1313   CREATININE 0.89 05/02/2015 0359   CREATININE 1.2 01/23/2015 1313   CALCIUM 8.0* 05/02/2015 0359   CALCIUM 9.1 01/23/2015 1313   PROT 6.7 05/02/2015 0359   PROT 7.7 01/23/2015 1313   ALBUMIN 1.7* 05/02/2015 0359   ALBUMIN 2.7* 01/23/2015 1313   AST 51* 05/02/2015 0359   AST 57* 01/23/2015 1313   ALT 22 05/02/2015 0359   ALT 26 01/23/2015 1313   ALKPHOS 202* 05/02/2015 0359   ALKPHOS 144 01/23/2015 1313   BILITOT 1.1 05/02/2015 0359   BILITOT 0.90 01/23/2015 1313   GFRNONAA >60 05/02/2015 0359   GFRAA >60 05/02/2015 0359    Assessment and Plan  Acute encephalopathy Nursing called me with report that pt was having decreasing alertness, confused and having B tremors. I ordered lactulose to be increased to 30 ml TID and ammonia level to be drawn expecting sister to want pt sent to ED. I was surprised when nursing called me back to say she only wanted comfort care. Ammonia level came back at 206. Pt was awake and speaking on exam.Will follow but expect that pt's life expectancy is [place].  Encounter for family conference without patient present I met pt's sister at SNF today. She was upset about her brothers increasing illness and about her difficult decision to go to comfort care with her brother based on his wishes. We discussed consult with Pallative or Hospice to help her, as she is alone. She told me she would consider it. We discussed possible future events for Jovanne including increased ammonia level again, infections, death, in which comfort care is the goal. Nursing will call her with any changes in status and any decisions to be made.    Hennie Duos, MD

## 2015-05-24 NOTE — Assessment & Plan Note (Signed)
I met pt's sister at SNF today. She was upset about her brothers increasing illness and about her difficult decision to go to comfort care with her brother based on his wishes. We discussed consult with Pallative or Hospice to help her, as she is alone. She told me she would consider it. We discussed possible future events for [name] including increased ammonia level again, infections, death, in which comfort care is the goal. Nursing will call her with any changes in status and any decisions to be made.

## 2015-05-24 NOTE — Assessment & Plan Note (Addendum)
Nursing called me with report that pt was having decreasing alertness, confused and having B tremors. I ordered lactulose to be increased to 30 ml TID and ammonia level to be drawn expecting sister to want pt sent to ED. I was surprised when nursing called me back to say she only wanted comfort care. Ammonia level came back at 206. Pt was awake and speaking on exam but expect this will be temporary. If nothing else, ammonia level will rise again. Will follow but expect that pt's life expectancy is [place].

## 2015-06-02 ENCOUNTER — Encounter: Payer: Self-pay | Admitting: Internal Medicine

## 2015-06-02 ENCOUNTER — Non-Acute Institutional Stay (SKILLED_NURSING_FACILITY): Payer: Medicare Other | Admitting: Internal Medicine

## 2015-06-02 DIAGNOSIS — Z710 Person encountering health services to consult on behalf of another person: Secondary | ICD-10-CM | POA: Diagnosis not present

## 2015-06-02 LAB — BASIC METABOLIC PANEL
BUN: 34 mg/dL — AB (ref 4–21)
CREATININE: 1.4 mg/dL — AB (ref 0.6–1.3)
Glucose: 213 mg/dL
POTASSIUM: 4.2 mmol/L (ref 3.4–5.3)
Sodium: 136 mmol/L — AB (ref 137–147)

## 2015-06-02 LAB — CBC AND DIFFERENTIAL
HCT: 30 % — AB (ref 41–53)
HEMOGLOBIN: 10 g/dL — AB (ref 13.5–17.5)
PLATELETS: 63 10*3/uL — AB (ref 150–399)
WBC: 1.9 10*3/mL

## 2015-06-02 NOTE — Progress Notes (Signed)
MRN: [id number] Name: [name]  Sex: male Age: 68 y.o. DOB: 1946-06-01  [place] #: Hel[facility] Facility/Room: Level Of Care: SNF Provider: Inocencio Homes D Emergency Contacts: Extended Emergency Contact Information Primary Emergency Contact: [name],[name] Address: [place], [place] Work Phone: [phone] Mobile Phone: [phone] Relation: Sister  Code Status: DNR  Allergies: Penicillins  Chief Complaint  Patient presents with  . Acute Visit    HPI: Patient is 69 y.o. male whose sister I am meeting along with Palliative to outline goals and actions.  Past Medical History  Diagnosis Date  . Hyperglycemia   . Hyponatremia   . DMII (diabetes mellitus, type 2)   . Depression   . IBS (irritable bowel syndrome)   . Chest pain   . Unspecified gastritis and gastroduodenitis without mention of hemorrhage   . Arthropathy, unspecified, site unspecified   . History of colon polyps   . Anemia, unspecified   . Anxiety   . GERD (gastroesophageal reflux disease)   . Hyperlipidemia   . Hypertension   . Liver problem     Past Surgical History  Procedure Laterality Date  . Hernia repair  1964  . Intramedullary (im) nail intertrochanteric Left 01/07/2015    Procedure: INTRAMEDULLARY (IM) NAIL INTERTROCHANTRIC;  Surgeon: Mcarthur Rossetti, MD;  Location: [facility];  Service: Orthopedics;  Laterality: Left;  . Femur im nail Right 03/24/2015    Procedure: CLOSED REDUCTION INTERNAL FIXATION;  Surgeon: Jessy Oto, MD;  Location: [facility];  Service: Orthopedics;  Laterality: Right;  . Esophagogastroduodenoscopy N/A 04/21/2015    Procedure: ESOPHAGOGASTRODUODENOSCOPY (EGD);  Surgeon: Arta Silence, MD;  Location[facility] END[facility]COPY;  Service: Endoscopy;  Laterality: N/A;  . Givens capsule study N/A 04/21/2015    Procedure: GIVENS CAPSULE STUDY;  Surgeon: Arta Silence, MD;  Location: [facility] ENDOSCOPY;  Service: Endoscopy;  Laterality: N/A;  .  Fracture surgery        Medication List       This list is accurate as of: 06/02/15 11:59 PM.  Always use your most recent med list.               barrier cream Crea  Commonly known as:  non-specified  Apply 1 application topically 2 (two) times daily. For the rash in perineal area until resolved, then BID as needed     bisacodyl 10 MG suppository  Commonly known as:  DULCOLAX  Place 10 mg rectally as needed for moderate constipation.     doxycycline 100 MG tablet  Commonly known as:  VIBRA-TABS  Take 1 tablet (100 mg total) by mouth 2 (two) times daily. X 7days     feeding supplement (ENSURE ENLIVE) Liqd  Take 237 mLs by mouth 2 (two) times daily between meals.     feeding supplement (PRO-STAT SUGAR FREE 64) Liqd  Take 30 mLs by mouth daily.     feeding supplement (PRO-STAT SUGAR FREE 64) Liqd  Take 30 mLs by mouth 2 (two) times daily between meals.     ferrous sulfate 325 (65 FE) MG tablet  Take 325 mg by mouth daily with breakfast.     FLUoxetine 20 MG capsule  Commonly known as:  PROZAC  Take 3 capsules (60 mg total) by mouth daily.     furosemide 20 MG tablet  Commonly known as:  LASIX  Take 40 mg by mouth daily.     glucose blood test  strip  1 each by Other route 4 (four) times daily -  before meals and at bedtime. Use as instructed     hydrocortisone 2.5 % rectal cream  Commonly known as:  ANUSOL-HC  Place rectally 2 (two) times daily as needed for hemorrhoids.     insulin aspart 100 UNIT/ML injection  Commonly known as:  novoLOG  Inject 0-9 Units into the skin 3 (three) times daily with meals. Sliding scale CBG 70 - 120: 0 units CBG 121 - 150: 1 unit,  CBG 151 - 200: 2 units,  CBG 201 - 250: 3 units,  CBG 251 - 300: 5 units,  CBG 301 - 350: 7 units,  CBG 351 - 400: 9 units   CBG > 400: 9 units and notify your MD     insulin glargine 100 UNIT/ML injection  Commonly known as:  LANTUS  Inject 0.05 mLs (5 Units total) into the skin at bedtime.      lactulose 10 GM/15ML solution  Commonly known as:  CHRONULAC  Take 15 mLs (10 g total) by mouth 2 (two) times daily. Titrate for goal 3-4 BM's a day     lamoTRIgine 25 MG tablet  Commonly known as:  LAMICTAL  Take 50 mg by mouth 2 (two) times daily.     levothyroxine 25 MCG tablet  Commonly known as:  SYNTHROID, LEVOTHROID  Take 25 mcg by mouth daily.     meclizine 25 MG tablet  Commonly known as:  ANTIVERT  Take 25 mg by mouth daily.     multivitamin with minerals Tabs tablet  Take 1 tablet by mouth daily.     pantoprazole 40 MG tablet  Commonly known as:  PROTONIX  Take 1 tablet (40 mg total) by mouth 2 (two) times daily.     phenylephrine-shark liver oil-mineral oil-petrolatum 0.25-3-14-71.9 % rectal ointment  Commonly known as:  PREPARATION H  Place 1 application rectally 2 (two) times daily as needed for hemorrhoids.     polyethylene glycol packet  Commonly known as:  MIRALAX / GLYCOLAX  Take 17 g by mouth daily as needed for mild constipation.     promethazine 25 MG tablet  Commonly known as:  PHENERGAN  Take 25 mg by mouth every 6 (six) hours as needed for nausea or vomiting.     simvastatin 40 MG tablet  Commonly known as:  ZOCOR  Take 40 mg by mouth at bedtime.     sodium chloride 0.65 % Soln nasal spray  Commonly known as:  OCEAN  Place 1 spray into both nostrils 3 (three) times daily as needed (for nosebleed).     spironolactone 25 MG tablet  Commonly known as:  ALDACTONE  Take 50 mg by mouth 2 (two) times daily.     tamsulosin 0.4 MG Caps capsule  Commonly known as:  FLOMAX  Take 0.4 mg by mouth daily.     traZODone 50 MG tablet  Commonly known as:  DESYREL  Take 1 tablet (50 mg total) by mouth at bedtime as needed for sleep.     Vitamin D3 1000 UNITS Caps  Take 1,000 Units by mouth every morning.        No orders of the defined types were placed in this encounter.    Immunization History  Administered Date(s) Administered  .  Influenza-Unspecified 09/13/2011  . PPD Test 01/07/2014  . Pneumococcal Polysaccharide-23 04/22/2015  . Pneumococcal-Unspecified 12/13/2009  . Tdap 12/13/2009    History  Substance Use Topics  .  Smoking status: Former Smoker -- 1.00 packs/day    Types: Cigarettes  . Smokeless tobacco: Not on file  . Alcohol Use: No    Review of Systems  DATA OBTAINED: fromnurse, family member GENERAL:  no fevers, SKIN: No itching, rash HEENT: No complaint RESPIRATORY: No cough, wheezing, SOB CARDIAC: No chest pain, palpitations, lower extremity edema  GI: No abdominal pain, vomited X 3 today, No heartburn or reflux  GU: No dysuria, frequency or urgency, or incontinence  MUSCULOSKELETAL: No unrelieved bone/joint pain NEUROLOGIC:confused today and restless, has good and bad days  PSYCHIATRIC: No overt anxiety or sadness  Filed Vitals:   06/02/15 2204  BP: 132/88  Pulse: 72  Temp: 98.9 F (37.2 C)  Resp: 19    Physical Exam  GENERAL APPEARANCE: Alert, aggitated SKIN: No diaphoresis rash, or wounds HEENT: Unremarkable RESPIRATORY: Breathing is even, unlabored. Lung sounds are clear   CARDIOVASCULAR: Heart RRR no murmurs, rubs or gallops. No peripheral edema  GASTROINTESTINAL: Abdomen is soft, non-tender, not distended w/ normal bowel sounds.  GENITOURINARY: Bladder non tender, not distended  MUSCULOSKELETAL: No abnormal joints or musculature NEUROLOGIC: Cranial nerves 2-12 grossly intact. Moves all extremities PSYCHIATRIC: confused no behavioral issues  Patient Active Problem List   Diagnosis Date Noted  . Anemia of chronic disease 05/05/2015  . Acute encephalopathy 05/01/2015  . Metabolic encephalopathy 99/37/1696  . Encounter for family conference without patient present 04/28/2015  . Edema of both legs 04/28/2015  . Acute blood loss anemia 04/19/2015  . GI bleed 04/19/2015  . Closed right hip fracture 03/24/2015  . Murmur, cardiac 03/24/2015  . Abnormal rhythmic movement  of tongue 03/24/2015  . Liver cirrhosis 03/24/2015  . Closed intertrochanteric fracture of right hip 03/24/2015    Class: Acute  . Urinary retention 01/15/2015  . CKD (chronic kidney disease) stage 3, GFR 30-59 ml/min 01/15/2015  . Closed left hip fracture 01/07/2015  . Hip fracture, left 01/07/2015  . Hip fracture 01/07/2015  . Hypothyroidism 01/07/2015  . Nausea vomiting and diarrhea   . Generalized weakness 07/20/2013  . Other pancytopenia 02/23/2013  . Internal and external bleeding hemorrhoids 11/29/2012  . Pruritus ani 11/29/2012  . DM (diabetes mellitus), type 2 with complications 78/93/8101  . Type 2 diabetes mellitus with neurological manifestations, uncontrolled 01/24/2012  . Depression 01/24/2012  . Psoriasis 01/24/2012  . Macrocytic anemia   . Anxiety   . GERD (gastroesophageal reflux disease)   . Hyperlipidemia   . Hypertension     CBC    Component Value Date/Time   WBC 2.9* 05/02/2015 0359   WBC 3.1* 01/23/2015 1313   RBC 2.95* 05/02/2015 0359   RBC 3.32* 01/23/2015 1313   RBC 4.13* 09/27/2009 0545   HGB 8.3* 05/02/2015 0359   HGB 9.4* 01/23/2015 1313   HCT 25.7* 05/02/2015 0359   HCT 29.9* 01/23/2015 1313   PLT 112* 05/02/2015 0359   PLT 148 01/23/2015 1313   MCV 87.1 05/02/2015 0359   MCV 90.0 01/23/2015 1313   LYMPHSABS 0.4* 04/30/2015 0920   LYMPHSABS 0.4* 01/23/2015 1313   MONOABS 0.2 04/30/2015 0920   MONOABS 0.2 01/23/2015 1313   EOSABS 0.0 04/30/2015 0920   EOSABS 0.1 01/23/2015 1313   BASOSABS 0.0 04/30/2015 0920   BASOSABS 0.0 01/23/2015 1313    CMP     Component Value Date/Time   NA 138 05/02/2015 0359   NA 138 01/23/2015 1313   K 3.2* 05/02/2015 0359   K 4.6 01/23/2015 1313   CL 111 05/02/2015  0359   CO2 23 05/02/2015 0359   CO2 24 01/23/2015 1313   GLUCOSE 107* 05/02/2015 0359   GLUCOSE 224* 01/23/2015 1313   BUN 20 05/02/2015 0359   BUN 31.2* 01/23/2015 1313   CREATININE 0.89 05/02/2015 0359   CREATININE 1.2 01/23/2015  1313   CALCIUM 8.0* 05/02/2015 0359   CALCIUM 9.1 01/23/2015 1313   PROT 6.7 05/02/2015 0359   PROT 7.7 01/23/2015 1313   ALBUMIN 1.7* 05/02/2015 0359   ALBUMIN 2.7* 01/23/2015 1313   AST 51* 05/02/2015 0359   AST 57* 01/23/2015 1313   ALT 22 05/02/2015 0359   ALT 26 01/23/2015 1313   ALKPHOS 202* 05/02/2015 0359   ALKPHOS 144 01/23/2015 1313   BILITOT 1.1 05/02/2015 0359   BILITOT 0.90 01/23/2015 1313   GFRNONAA >60 05/02/2015 0359   GFRAA >60 05/02/2015 0359    Assessment and Plan  Encounter for family conference without patient present Met with pt's sister, and Tomi with Pallative who was just meeting sister and Josh with palliative. Sister was supported. Plan to stop several medicines that may be causing depressed MS. If confusion contiunes in 48 hrs will withdraw lactulose. Sister is ready to do this, she and Amauris had a chance to discuss this last week and Cliford was very clear what he wanted his sisiter to do. If he clears up some will play it by ear.Palliative is working on Manufacturing systems engineer for [employer].   Time spent with pt,family ,paliative 60 min Hennie Duos, MD

## 2015-06-04 LAB — TSH: TSH: 1.14 u[IU]/mL (ref <=5.90)

## 2015-06-04 LAB — HEMOGLOBIN A1C: HEMOGLOBIN A1C: 6.3 % — AB (ref 4.0–6.0)

## 2015-06-06 ENCOUNTER — Non-Acute Institutional Stay (SKILLED_NURSING_FACILITY): Payer: Medicare Other | Admitting: Internal Medicine

## 2015-06-06 ENCOUNTER — Encounter: Payer: Self-pay | Admitting: Internal Medicine

## 2015-06-06 DIAGNOSIS — E1165 Type 2 diabetes mellitus with hyperglycemia: Principal | ICD-10-CM

## 2015-06-06 DIAGNOSIS — E1141 Type 2 diabetes mellitus with diabetic mononeuropathy: Secondary | ICD-10-CM

## 2015-06-06 DIAGNOSIS — K7031 Alcoholic cirrhosis of liver with ascites: Secondary | ICD-10-CM | POA: Diagnosis not present

## 2015-06-06 DIAGNOSIS — IMO0002 Reserved for concepts with insufficient information to code with codable children: Secondary | ICD-10-CM

## 2015-06-06 DIAGNOSIS — Z515 Encounter for palliative care: Secondary | ICD-10-CM

## 2015-06-06 DIAGNOSIS — E1149 Type 2 diabetes mellitus with other diabetic neurological complication: Secondary | ICD-10-CM

## 2015-06-06 DIAGNOSIS — G9340 Encephalopathy, unspecified: Secondary | ICD-10-CM | POA: Diagnosis not present

## 2015-06-06 NOTE — Assessment & Plan Note (Signed)
Met with pt's sister, and Tomi with Pallative who was just meeting sister and Josh with palliative. Sister was supported. Plan to stop several medicines that may be causing depressed MS. If confusion contiunes in 48 hrs will withdraw lactulose. Sister is ready to do this, she and Yona had a chance to discuss this last week and Jahzion was very clear what he wanted his sisiter to do. If he clears up some will play it by ear.Palliative is working on Manufacturing systems engineer for [employer].

## 2015-06-06 NOTE — Progress Notes (Signed)
MRN: [id number] Name: [name]  Sex: male Age: 69 y.o. DOB: [birth year]  [id number] Facility/Room:306 Level Of Care: SNF Provider: Merrilee Seashore D Emergency Contacts: Extended Emergency Contact Information Primary Emergency Contact: [name],[name] Address: [address]          [place], [place] Work Phone: [phone] Mobile Phone: [phone] Relation: Sister  Code Status: DNR  Allergies: Penicillins  Chief Complaint  Patient presents with  . Medical Management of Chronic Issues    HPI: Patient is 69 y.o. male who is being seen for f/u of meeting Monday and for elevated BS.    Past Medical History  Diagnosis Date  . Hyperglycemia   . Hyponatremia   . DMII (diabetes mellitus, type 2)   . Depression   . IBS (irritable bowel syndrome)   . Chest pain   . Unspecified gastritis and gastroduodenitis without mention of hemorrhage   . Arthropathy, unspecified, site unspecified   . History of colon polyps   . Anemia, unspecified   . Anxiety   . GERD (gastroesophageal reflux disease)   . Hyperlipidemia   . Hypertension   . Liver problem     Past Surgical History  Procedure Laterality Date  . Hernia repair  1964  . Intramedullary (im) nail intertrochanteric Left 01/07/2015    Procedure: INTRAMEDULLARY (IM) NAIL INTERTROCHANTRIC;  Surgeon: Kathryne Hitch, MD;  Location: [facility];  Service: Orthopedics;  Laterality: Left;  . Femur im nail Right 03/24/2015    Procedure: CLOSED REDUCTION INTERNAL FIXATION;  Surgeon: Kerrin Champagne, MD;  Location: [facility];  Service: Orthopedics;  Laterality: Right;  . Esophagogastroduodenoscopy N/A 04/21/2015    Procedure: ESOPHAGOGASTRODUODENOSCOPY (EGD);  Surgeon: Willis Modena, MD;  Location: [facility];  Service: Endoscopy;  Laterality: N/A;  . Givens capsule study N/A 04/21/2015    Procedure: GIVENS CAPSULE STUDY;  Surgeon: Willis Modena, MD;  Location[facility];  Service: Endoscopy;   Laterality: N/A;  . Fracture surgery        Medication List       This list is accurate as of: 06/06/15 11:59 PM.  Always use your most recent med list.               barrier cream Crea  Commonly known as:  non-specified  Apply 1 application topically 2 (two) times daily. For the rash in perineal area until resolved, then BID as needed     bisacodyl 10 MG suppository  Commonly known as:  DULCOLAX  Place 10 mg rectally as needed for moderate constipation.     doxycycline 100 MG tablet  Commonly known as:  VIBRA-TABS  Take 1 tablet (100 mg total) by mouth 2 (two) times daily. X 7days     feeding supplement (ENSURE ENLIVE) Liqd  Take 237 mLs by mouth 2 (two) times daily between meals.     feeding supplement (PRO-STAT SUGAR FREE 64) Liqd  Take 30 mLs by mouth daily.     feeding supplement (PRO-STAT SUGAR FREE 64) Liqd  Take 30 mLs by mouth 2 (two) times daily between meals.     ferrous sulfate 325 (65 FE) MG tablet  Take 325 mg by mouth daily with breakfast.     FLUoxetine 20 MG capsule  Commonly known as:  PROZAC  Take 3 capsules (60 mg total) by mouth daily.     furosemide 20 MG tablet  Commonly known as:  LASIX  Take 40 mg by mouth daily.  glucose blood test strip  1 each by Other route 4 (four) times daily -  before meals and at bedtime. Use as instructed     hydrocortisone 2.5 % rectal cream  Commonly known as:  ANUSOL-HC  Place rectally 2 (two) times daily as needed for hemorrhoids.     insulin aspart 100 UNIT/ML injection  Commonly known as:  novoLOG  Inject 0-9 Units into the skin 3 (three) times daily with meals. Sliding scale CBG 70 - 120: 0 units CBG 121 - 150: 1 unit,  CBG 151 - 200: 2 units,  CBG 201 - 250: 3 units,  CBG 251 - 300: 5 units,  CBG 301 - 350: 7 units,  CBG 351 - 400: 9 units   CBG > 400: 9 units and notify your MD     insulin glargine 100 UNIT/ML injection  Commonly known as:  LANTUS  Inject 0.05 mLs (5 Units total) into the skin at  bedtime.     lactulose 10 GM/15ML solution  Commonly known as:  CHRONULAC  Take 15 mLs (10 g total) by mouth 2 (two) times daily. Titrate for goal 3-4 BM's a day     lamoTRIgine 25 MG tablet  Commonly known as:  LAMICTAL  Take 50 mg by mouth 2 (two) times daily.     levothyroxine 25 MCG tablet  Commonly known as:  SYNTHROID, LEVOTHROID  Take 25 mcg by mouth daily.     meclizine 25 MG tablet  Commonly known as:  ANTIVERT  Take 25 mg by mouth daily.     multivitamin with minerals Tabs tablet  Take 1 tablet by mouth daily.     pantoprazole 40 MG tablet  Commonly known as:  PROTONIX  Take 1 tablet (40 mg total) by mouth 2 (two) times daily.     phenylephrine-shark liver oil-mineral oil-petrolatum 0.25-3-14-71.9 % rectal ointment  Commonly known as:  PREPARATION H  Place 1 application rectally 2 (two) times daily as needed for hemorrhoids.     polyethylene glycol packet  Commonly known as:  MIRALAX / GLYCOLAX  Take 17 g by mouth daily as needed for mild constipation.     promethazine 25 MG tablet  Commonly known as:  PHENERGAN  Take 25 mg by mouth every 6 (six) hours as needed for nausea or vomiting.     simvastatin 40 MG tablet  Commonly known as:  ZOCOR  Take 40 mg by mouth at bedtime.     sodium chloride 0.65 % Soln nasal spray  Commonly known as:  OCEAN  Place 1 spray into both nostrils 3 (three) times daily as needed (for nosebleed).     spironolactone 25 MG tablet  Commonly known as:  ALDACTONE  Take 50 mg by mouth 2 (two) times daily.     tamsulosin 0.4 MG Caps capsule  Commonly known as:  FLOMAX  Take 0.4 mg by mouth daily.     traZODone 50 MG tablet  Commonly known as:  DESYREL  Take 1 tablet (50 mg total) by mouth at bedtime as needed for sleep.     Vitamin D3 1000 UNITS Caps  Take 1,000 Units by mouth every morning.        No orders of the defined types were placed in this encounter.    Immunization History  Administered Date(s) Administered   . Influenza-Unspecified 09/13/2011  . PPD Test 01/07/2014  . Pneumococcal Polysaccharide-23 04/22/2015  . Pneumococcal-Unspecified 12/13/2009  . Tdap 12/13/2009    History  Substance Use Topics  . Smoking status: Former Smoker -- 1.00 packs/day    Types: Cigarettes  . Smokeless tobacco: Not on file  . Alcohol Use: No    Review of Systems  DATA OBTAINED: from nurse GENERAL:  no fevers, SKIN: No rash but itching diffusely HEENT: No complaint RESPIRATORY: No cough, wheezing, SOB CARDIAC: No chest pain, palpitations, lower extremity edema  GI: No abdominal pain, No N/V/D or constipation, No heartburn or reflux  GU: No dysuria, frequency or urgency, or incontinence  MUSCULOSKELETAL: No unrelieved bone/joint pain NEUROLOGIC: No headache, dizziness  PSYCHIATRIC: No overt anxiety  Filed Vitals:   06/06/15 0900  BP: 127/96  Pulse: 65  Temp: 97.6 F (36.4 C)  Resp: 17    Physical Exam  GENERAL APPEARANCE:  No acute distress  SKIN: No diaphoresis rash HEENT: Unremarkable RESPIRATORY: Breathing is even, unlabored. Lung sounds are clear   CARDIOVASCULAR: Heart RRR no murmurs, rubs or gallops. No peripheral edema  GASTROINTESTINAL: Abdomen is soft, non-tender, not distended w/ normal bowel sounds.  GENITOURINARY: Bladder non tender, not distended  MUSCULOSKELETAL: No abnormal joints or musculature NEUROLOGIC: Cranial nerves 2-12 grossly intact PSYCHIATRIC: Mood and affect appropriate to situation, no behavioral issues  Patient Active Problem List   Diagnosis Date Noted  . Hospice care patient 06/08/2015  . Anemia of chronic disease 05/05/2015  . Acute encephalopathy 05/01/2015  . Metabolic encephalopathy 04/30/2015  . Encounter for family conference without patient present 04/28/2015  . Edema of both legs 04/28/2015  . Acute blood loss anemia 04/19/2015  . GI bleed 04/19/2015  . Closed right hip fracture 03/24/2015  . Murmur, cardiac 03/24/2015  . Abnormal rhythmic  movement of tongue 03/24/2015  . Liver cirrhosis 03/24/2015  . Closed intertrochanteric fracture of right hip 03/24/2015    Class: Acute  . Urinary retention 01/15/2015  . CKD (chronic kidney disease) stage 3, GFR 30-59 ml/min 01/15/2015  . Closed left hip fracture 01/07/2015  . Hip fracture, left 01/07/2015  . Hip fracture 01/07/2015  . Hypothyroidism 01/07/2015  . Nausea vomiting and diarrhea   . Generalized weakness 07/20/2013  . Other pancytopenia 02/23/2013  . Internal and external bleeding hemorrhoids 11/29/2012  . Pruritus ani 11/29/2012  . DM (diabetes mellitus), type 2 with complications 11/13/2012  . Type 2 diabetes mellitus with neurological manifestations, uncontrolled 01/24/2012  . Depression 01/24/2012  . Psoriasis 01/24/2012  . Macrocytic anemia   . Anxiety   . GERD (gastroesophageal reflux disease)   . Hyperlipidemia   . Hypertension     CBC    Component Value Date/Time   WBC 2.9* 05/02/2015 0359   WBC 3.1* 01/23/2015 1313   RBC 2.95* 05/02/2015 0359   RBC 3.32* 01/23/2015 1313   RBC 4.13* 09/27/2009 0545   HGB 8.3* 05/02/2015 0359   HGB 9.4* 01/23/2015 1313   HCT 25.7* 05/02/2015 0359   HCT 29.9* 01/23/2015 1313   PLT 112* 05/02/2015 0359   PLT 148 01/23/2015 1313   MCV 87.1 05/02/2015 0359   MCV 90.0 01/23/2015 1313   LYMPHSABS 0.4* 04/30/2015 0920   LYMPHSABS 0.4* 01/23/2015 1313   MONOABS 0.2 04/30/2015 0920   MONOABS 0.2 01/23/2015 1313   EOSABS 0.0 04/30/2015 0920   EOSABS 0.1 01/23/2015 1313   BASOSABS 0.0 04/30/2015 0920   BASOSABS 0.0 01/23/2015 1313    CMP     Component Value Date/Time   NA 138 05/02/2015 0359   NA 138 01/23/2015 1313   K 3.2* 05/02/2015 0359   K  4.6 01/23/2015 1313   CL 111 05/02/2015 0359   CO2 23 05/02/2015 0359   CO2 24 01/23/2015 1313   GLUCOSE 107* 05/02/2015 0359   GLUCOSE 224* 01/23/2015 1313   BUN 20 05/02/2015 0359   BUN 31.2* 01/23/2015 1313   CREATININE 0.89 05/02/2015 0359   CREATININE 1.2  01/23/2015 1313   CALCIUM 8.0* 05/02/2015 0359   CALCIUM 9.1 01/23/2015 1313   PROT 6.7 05/02/2015 0359   PROT 7.7 01/23/2015 1313   ALBUMIN 1.7* 05/02/2015 0359   ALBUMIN 2.7* 01/23/2015 1313   AST 51* 05/02/2015 0359   AST 57* 01/23/2015 1313   ALT 22 05/02/2015 0359   ALT 26 01/23/2015 1313   ALKPHOS 202* 05/02/2015 0359   ALKPHOS 144 01/23/2015 1313   BILITOT 1.1 05/02/2015 0359   BILITOT 0.90 01/23/2015 1313   GFRNONAA >60 05/02/2015 0359   GFRAA >60 05/02/2015 0359    Assessment and Plan  Type 2 diabetes mellitus with neurological manifestations, uncontrolled BS have been high with liberalized diet. Tight or even good control not really a goal but control of highs which may cause discomfort is important . Increase toujeo to 12 units nightlt.  Liver cirrhosis Pt amazingly has not had peripheral edema or significant ascites. Will dec lasix to 20 mg daily.  Acute encephalopathy With the change of some meds per palliative pt has improved MS, eating beter.  Hospice care patient Palliative worked to get pt into Hospice care. He is definitely appropriate for it, is having some good days currently.    Margit Hanks, MD

## 2015-06-08 ENCOUNTER — Encounter: Payer: Self-pay | Admitting: Internal Medicine

## 2015-06-08 DIAGNOSIS — Z515 Encounter for palliative care: Secondary | ICD-10-CM | POA: Insufficient documentation

## 2015-06-08 NOTE — Assessment & Plan Note (Signed)
Palliative worked to get pt into Hospice care. He is definitely appropriate for it, is having some good days currently.

## 2015-06-08 NOTE — Assessment & Plan Note (Signed)
BS have been high with liberalized diet. Tight or even good control not really a goal but control of highs which may cause discomfort is important . Increase toujeo to 12 units nightlt.

## 2015-06-08 NOTE — Assessment & Plan Note (Signed)
Pt amazingly has not had peripheral edema or significant ascites. Will dec lasix to 20 mg daily.

## 2015-06-08 NOTE — Assessment & Plan Note (Signed)
With the change of some meds per palliative pt has improved MS, eating beter.

## 2015-06-09 ENCOUNTER — Other Ambulatory Visit: Payer: Self-pay

## 2015-06-24 ENCOUNTER — Ambulatory Visit: Payer: Self-pay | Admitting: Oncology

## 2015-06-24 ENCOUNTER — Other Ambulatory Visit: Payer: Self-pay

## 2015-06-26 ENCOUNTER — Non-Acute Institutional Stay (SKILLED_NURSING_FACILITY): Payer: Medicare Other | Admitting: Internal Medicine

## 2015-06-26 DIAGNOSIS — R400 Somnolence: Secondary | ICD-10-CM

## 2015-07-03 ENCOUNTER — Ambulatory Visit: Payer: Self-pay | Admitting: Oncology

## 2015-07-03 ENCOUNTER — Other Ambulatory Visit: Payer: Self-pay

## 2015-07-03 ENCOUNTER — Telehealth: Payer: Self-pay | Admitting: Oncology

## 2015-07-03 NOTE — Telephone Encounter (Signed)
Pt's daughter called states pt is in his last days and that she wanted to cancel today's apt. She also wanted to know if Dr. Clelia Croft wanted pt to come back to see him or if there was even a need of it. Pt is being seen by Hospice..... Sent msg to MD..Marland KitchenMarland Kitchen

## 2015-07-05 ENCOUNTER — Encounter: Payer: Self-pay | Admitting: Internal Medicine

## 2015-07-05 DIAGNOSIS — R4182 Altered mental status, unspecified: Secondary | ICD-10-CM | POA: Insufficient documentation

## 2015-07-05 NOTE — Assessment & Plan Note (Signed)
This is an acute, but not unexpected problem.Nursing report decreased BM last few days and increased sleepiness. Reportedly sister called and asked about ammonia level which doesn't make sense based on our prior conversations. Could be hepatic, or narcotics 2/2 liver failure ,probably both. Hospice nurse and I discussed at length.  Will decrease morphine to  q 12 scheduled and continue prn morphine 5 mg q 4prn. Will change lactulose 30 mg TID to 45 mg BID and 30 mg in evening, trying to keep his diarrhea from being so bad. I called pt's sister who is OOT on a vacation planned before Zalan became so ill. I explained all in detail and reassured. I don't think Teri will die before she gets home.

## 2015-07-05 NOTE — Progress Notes (Signed)
MRN: [id number] Name: [name]  Sex: male Age: 69 y.o. DOB: 11-19-1946  PSC #: [name] Facility/Room:306 Level Of Care: SNF Provider: Merrilee Seashore D Emergency Contacts: Extended Emergency Contact Information Primary Emergency Contact: [name],[name] Address: [address]          [place], [place] Work Phone: [phone] Mobile Phone: [phone] Relation: Sister  Code Status: DNR  Allergies: Penicillins  Chief Complaint  Patient presents with  . Acute Visit    HPI: Patient is 69 y.o. 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  Past Medical History  Diagnosis Date  . Hyperglycemia   . Hyponatremia   . DMII (diabetes mellitus, type 2)   . Depression   . IBS (irritable bowel syndrome)   . Chest pain   . Unspecified gastritis and gastroduodenitis without mention of hemorrhage   . Arthropathy, unspecified, site unspecified   . History of colon polyps   . Anemia, unspecified   . Anxiety   . GERD (gastroesophageal reflux disease)   . Hyperlipidemia   . Hypertension   . Liver problem     Past Surgical History  Procedure Laterality Date  . Hernia repair  1964  . Intramedullary (im) nail intertrochanteric Left 01/07/2015    Procedure: INTRAMEDULLARY (IM) NAIL INTERTROCHANTRIC;  Surgeon: Kathryne Hitch, MD;  Location: [facility];  Service: Orthopedics;  Laterality: Left;  . Femur im nail Right 03/24/2015    Procedure: CLOSED REDUCTION INTERNAL FIXATION;  Surgeon: Kerrin Champagne, MD;  Location: [facility] OR;  Service: Orthopedics;  Laterality: Right;  . Esophagogastroduodenoscopy N/A 04/21/2015    Procedure: ESOPHAGOGASTRODUODENOSCOPY (EGD);  Surgeon: Willis Modena, MD;  Location: [facility] ENDOSCOPY;  Service: Endoscopy;  Laterality: N/A;  . Givens capsule study N/A 04/21/2015    Procedure: GIVENS CAPSULE STUDY;  Surgeon: Willis Modena, MD;  Location: [facility]  ENDOSCOPY;  Service: Endoscopy;  Laterality: N/A;  . Fracture surgery        Medication List       This list is accurate as of: 06/26/15 11:59 PM.  Always use your most recent med list.               barrier cream Crea  Commonly known as:  non-specified  Apply 1 application topically 2 (two) times daily. For the rash in perineal area until resolved, then BID as needed     bisacodyl 10 MG suppository  Commonly known as:  DULCOLAX  Place 10 mg rectally as needed for moderate constipation.     doxycycline 100 MG tablet  Commonly known as:  VIBRA-TABS  Take 1 tablet (100 mg total) by mouth 2 (two) times daily. X 7days     feeding supplement (ENSURE ENLIVE) Liqd  Take 237 mLs by mouth 2 (two) times daily between meals.     feeding supplement (PRO-STAT SUGAR FREE 64) Liqd  Take 30 mLs by mouth daily.     feeding supplement (PRO-STAT SUGAR FREE 64) Liqd  Take 30 mLs by mouth 2 (two) times daily between meals.     ferrous sulfate 325 (65 FE) MG tablet  Take 325 mg by mouth daily with breakfast.     FLUoxetine 20 MG capsule  Commonly known as:  PROZAC  Take 3 capsules (60 mg total) by mouth daily.     furosemide 20 MG tablet  Commonly known as:  LASIX  Take 40 mg by mouth daily.  glucose blood test strip  1 each by Other route 4 (four) times daily -  before meals and at bedtime. Use as instructed     hydrocortisone 2.5 % rectal cream  Commonly known as:  ANUSOL-HC  Place rectally 2 (two) times daily as needed for hemorrhoids.     insulin aspart 100 UNIT/ML injection  Commonly known as:  novoLOG  Inject 0-9 Units into the skin 3 (three) times daily with meals. Sliding scale CBG 70 - 120: 0 units CBG 121 - 150: 1 unit,  CBG 151 - 200: 2 units,  CBG 201 - 250: 3 units,  CBG 251 - 300: 5 units,  CBG 301 - 350: 7 units,  CBG 351 - 400: 9 units   CBG > 400: 9 units and notify your MD     insulin glargine 100 UNIT/ML injection  Commonly known as:  LANTUS  Inject 0.05 mLs  (5 Units total) into the skin at bedtime.     lactulose 10 GM/15ML solution  Commonly known as:  CHRONULAC  Take 15 mLs (10 g total) by mouth 2 (two) times daily. Titrate for goal 3-4 BM's a day     lamoTRIgine 25 MG tablet  Commonly known as:  LAMICTAL  Take 50 mg by mouth 2 (two) times daily.     levothyroxine 25 MCG tablet  Commonly known as:  SYNTHROID, LEVOTHROID  Take 25 mcg by mouth daily.     meclizine 25 MG tablet  Commonly known as:  ANTIVERT  Take 25 mg by mouth daily.     multivitamin with minerals Tabs tablet  Take 1 tablet by mouth daily.     pantoprazole 40 MG tablet  Commonly known as:  PROTONIX  Take 1 tablet (40 mg total) by mouth 2 (two) times daily.     phenylephrine-shark liver oil-mineral oil-petrolatum 0.25-3-14-71.9 % rectal ointment  Commonly known as:  PREPARATION H  Place 1 application rectally 2 (two) times daily as needed for hemorrhoids.     polyethylene glycol packet  Commonly known as:  MIRALAX / GLYCOLAX  Take 17 g by mouth daily as needed for mild constipation.     promethazine 25 MG tablet  Commonly known as:  PHENERGAN  Take 25 mg by mouth every 6 (six) hours as needed for nausea or vomiting.     simvastatin 40 MG tablet  Commonly known as:  ZOCOR  Take 40 mg by mouth at bedtime.     sodium chloride 0.65 % Soln nasal spray  Commonly known as:  OCEAN  Place 1 spray into both nostrils 3 (three) times daily as needed (for nosebleed).     spironolactone 25 MG tablet  Commonly known as:  ALDACTONE  Take 50 mg by mouth 2 (two) times daily.     tamsulosin 0.4 MG Caps capsule  Commonly known as:  FLOMAX  Take 0.4 mg by mouth daily.     traZODone 50 MG tablet  Commonly known as:  DESYREL  Take 1 tablet (50 mg total) by mouth at bedtime as needed for sleep.     Vitamin D3 1000 UNITS Caps  Take 1,000 Units by mouth every morning.        No orders of the defined types were placed in this encounter.    Immunization History   Administered Date(s) Administered  . Influenza-Unspecified 09/13/2011  . PPD Test 01/07/2014  . Pneumococcal Polysaccharide-23 04/22/2015  . Pneumococcal-Unspecified 12/13/2009  . Tdap 12/13/2009    History  Substance Use Topics  . Smoking status: Former Smoker -- 1.00 packs/day    Types: Cigarettes  . Smokeless tobacco: Not on file  . Alcohol Use: No    Review of Systems  DATA OBTAINED: from nurse GENERAL:  no fevers, +appetite changes SKIN: No itching, rash HEENT: No complaint RESPIRATORY: No cough, wheezing, SOB CARDIAC: No chest pain, palpitations, lower extremity edema  GI: No abdominal pain, No N/V/D , +constipation for him GU: No dysuria, frequency or urgency, or incontinence  MUSCULOSKELETAL: No unrelieved bone/joint pain NEUROLOGIC: No headache, dizziness  PSYCHIATRIC: No overt anxiety or sadness  Filed Vitals:   06/26/15 2142  BP: 132/76  Pulse: 72  Temp: 97.1 F (36.2 C)  Resp: 16    Physical Exam  GENERAL APPEARANCE: sleepy, No acute distress , changed from 3 days ago SKIN: No diaphoresis rash HEENT: Unremarkable RESPIRATORY: Breathing is even, unlabored. Lung sounds are clear   CARDIOVASCULAR: Heart RRR no murmurs, rubs or gallops. No peripheral edema  GASTROINTESTINAL: Abdomen is soft, non-tender, not distended w/ normal bowel sounds.  GENITOURINARY: Bladder non tender, not distended  MUSCULOSKELETAL: No abnormal joints or musculature NEUROLOGIC: Cranial nerves 2-12 grossly intact. Moves all extremities; pt recognized me and smiled nodded answers appropriately PSYCHIATRIC: Mood and affect appropriate to situation, no behavioral issues  Patient Active Problem List   Diagnosis Date Noted  . Mental status change 07/05/2015  . Hospice care patient 06/08/2015  . Anemia of chronic disease 05/05/2015  . Acute encephalopathy 05/01/2015  . Metabolic encephalopathy 04/30/2015  . Encounter for family conference without patient present 04/28/2015  .  Edema of both legs 04/28/2015  . Acute blood loss anemia 04/19/2015  . GI bleed 04/19/2015  . Closed right hip fracture 03/24/2015  . Murmur, cardiac 03/24/2015  . Abnormal rhythmic movement of tongue 03/24/2015  . Liver cirrhosis 03/24/2015  . Closed intertrochanteric fracture of right hip 03/24/2015    Class: Acute  . Urinary retention 01/15/2015  . CKD (chronic kidney disease) stage 3, GFR 30-59 ml/min 01/15/2015  . Closed left hip fracture 01/07/2015  . Hip fracture, left 01/07/2015  . Hip fracture 01/07/2015  . Hypothyroidism 01/07/2015  . Nausea vomiting and diarrhea   . Generalized weakness 07/20/2013  . Other pancytopenia 02/23/2013  . Internal and external bleeding hemorrhoids 11/29/2012  . Pruritus ani 11/29/2012  . DM (diabetes mellitus), type 2 with complications 11/13/2012  . Type 2 diabetes mellitus with neurological manifestations, uncontrolled 01/24/2012  . Depression 01/24/2012  . Psoriasis 01/24/2012  . Macrocytic anemia   . Anxiety   . GERD (gastroesophageal reflux disease)   . Hyperlipidemia   . Hypertension     CBC    Component Value Date/Time   WBC 2.9* 05/02/2015 0359   WBC 3.1* 01/23/2015 1313   RBC 2.95* 05/02/2015 0359   RBC 3.32* 01/23/2015 1313   RBC 4.13* 09/27/2009 0545   HGB 8.3* 05/02/2015 0359   HGB 9.4* 01/23/2015 1313   HCT 25.7* 05/02/2015 0359   HCT 29.9* 01/23/2015 1313   PLT 112* 05/02/2015 0359   PLT 148 01/23/2015 1313   MCV 87.1 05/02/2015 0359   MCV 90.0 01/23/2015 1313   LYMPHSABS 0.4* 04/30/2015 0920   LYMPHSABS 0.4* 01/23/2015 1313   MONOABS 0.2 04/30/2015 0920   MONOABS 0.2 01/23/2015 1313   EOSABS 0.0 04/30/2015 0920   EOSABS 0.1 01/23/2015 1313   BASOSABS 0.0 04/30/2015 0920   BASOSABS 0.0 01/23/2015 1313    CMP     Component Value Date/Time  NA 138 05/02/2015 0359   NA 138 01/23/2015 1313   K 3.2* 05/02/2015 0359   K 4.6 01/23/2015 1313   CL 111 05/02/2015 0359   CO2 23 05/02/2015 0359   CO2 24  01/23/2015 1313   GLUCOSE 107* 05/02/2015 0359   GLUCOSE 224* 01/23/2015 1313   BUN 20 05/02/2015 0359   BUN 31.2* 01/23/2015 1313   CREATININE 0.89 05/02/2015 0359   CREATININE 1.2 01/23/2015 1313   CALCIUM 8.0* 05/02/2015 0359   CALCIUM 9.1 01/23/2015 1313   PROT 6.7 05/02/2015 0359   PROT 7.7 01/23/2015 1313   ALBUMIN 1.7* 05/02/2015 0359   ALBUMIN 2.7* 01/23/2015 1313   AST 51* 05/02/2015 0359   AST 57* 01/23/2015 1313   ALT 22 05/02/2015 0359   ALT 26 01/23/2015 1313   ALKPHOS 202* 05/02/2015 0359   ALKPHOS 144 01/23/2015 1313   BILITOT 1.1 05/02/2015 0359   BILITOT 0.90 01/23/2015 1313   GFRNONAA >60 05/02/2015 0359   GFRAA >60 05/02/2015 0359    Assessment and Plan  Mental status change This is an acute, but not unexpected problem.Nursing report decreased BM last few days and increased sleepiness. Reportedly sister called and asked about ammonia level which doesn't make sense based on our prior conversations. Could be hepatic, or narcotics 2/2 liver failure ,probably both. Hospice nurse and I discussed at length.  Will decrease morphine to 2mg  q 12 scheduled and continue prn morphine 5 mg q 4prn. Will change lactulose 30 mg TID to 45 mg BID and 30 mg in evening, trying to keep his diarrhea from being so bad. I called pt's sister who is OOT on a vacation planned before [name] became so ill. I explained all in detail and reassured. I don't think [name] will die before she gets home.   Time spent > 35 min > 50% of time with patient was spent reviewing records, labs, tests and studies, counseling and developing plan of care.  Pt seen 06/26/2015. Margit Hanks, MD

## 2015-08-01 ENCOUNTER — Encounter: Payer: Self-pay | Admitting: Nurse Practitioner

## 2015-08-01 ENCOUNTER — Non-Acute Institutional Stay (SKILLED_NURSING_FACILITY): Payer: Medicare Other | Admitting: Nurse Practitioner

## 2015-08-01 DIAGNOSIS — E1149 Type 2 diabetes mellitus with other diabetic neurological complication: Secondary | ICD-10-CM

## 2015-08-01 DIAGNOSIS — E038 Other specified hypothyroidism: Secondary | ICD-10-CM | POA: Diagnosis not present

## 2015-08-01 DIAGNOSIS — F329 Major depressive disorder, single episode, unspecified: Secondary | ICD-10-CM | POA: Diagnosis not present

## 2015-08-01 DIAGNOSIS — E034 Atrophy of thyroid (acquired): Secondary | ICD-10-CM

## 2015-08-01 DIAGNOSIS — F32A Depression, unspecified: Secondary | ICD-10-CM

## 2015-08-01 DIAGNOSIS — E1165 Type 2 diabetes mellitus with hyperglycemia: Secondary | ICD-10-CM

## 2015-08-01 DIAGNOSIS — K219 Gastro-esophageal reflux disease without esophagitis: Secondary | ICD-10-CM | POA: Diagnosis not present

## 2015-08-01 DIAGNOSIS — K7030 Alcoholic cirrhosis of liver without ascites: Secondary | ICD-10-CM

## 2015-08-01 DIAGNOSIS — I10 Essential (primary) hypertension: Secondary | ICD-10-CM

## 2015-08-01 DIAGNOSIS — IMO0002 Reserved for concepts with insufficient information to code with codable children: Secondary | ICD-10-CM

## 2015-08-01 DIAGNOSIS — E1141 Type 2 diabetes mellitus with diabetic mononeuropathy: Secondary | ICD-10-CM

## 2015-08-01 NOTE — Progress Notes (Signed)
Patient ID: [name], male   DOB: 1946-01-06, 69 y.o.   MRN: [id number]    Nursing Home Location:  [facility]   Place of Service: SNF (31)  PCP: Margit Hanks, MD  Allergies  Allergen Reactions  . Penicillins Hives    Chief Complaint  Patient presents with  . Medical Management of Chronic Issues    HPI:  Patient is a 69 y.o. male seen today at [facility] for routine management of chronic conditions. Pt also wanted to see provider. 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  Pt was seen in July due to mental status changes which were thought to be due to narcotics and liver failure. Morphine and lactulose was adjusted.  Without any acute issues today. pts bowels are moving good. No reports of acute pain. pts appetite is good and acutally blood sugars are high.   Review of Systems:  Review of Systems  Constitutional: Negative for activity change, appetite change, fatigue and unexpected weight change.  HENT: Negative for congestion, hearing loss and sore throat.   Eyes: Negative.   Respiratory: Negative for cough and shortness of breath.   Cardiovascular: Negative for chest pain, palpitations and leg swelling.  Gastrointestinal: Negative for abdominal pain, diarrhea and constipation.  Genitourinary: Negative for dysuria, urgency, flank pain and difficulty urinating.  Musculoskeletal: Negative for myalgias and arthralgias.  Skin: Negative for color change, pallor, rash and wound.  Neurological: Negative for weakness, light-headedness and headaches.  Psychiatric/Behavioral: Negative for behavioral problems, confusion and agitation. The patient is not nervous/anxious.     Past Medical History  Diagnosis Date  . Hyperglycemia   . Hyponatremia   . DMII  (diabetes mellitus, type 2)   . Depression   . IBS (irritable bowel syndrome)   . Chest pain   . Unspecified gastritis and gastroduodenitis without mention of hemorrhage   . Arthropathy, unspecified, site unspecified   . History of colon polyps   . Anemia, unspecified   . Anxiety   . GERD (gastroesophageal reflux disease)   . Hyperlipidemia   . Hypertension   . Liver problem    Past Surgical History  Procedure Laterality Date  . Hernia repair  1964  . Intramedullary (im) nail intertrochanteric Left 01/07/2015    Procedure: INTRAMEDULLARY (IM) NAIL INTERTROCHANTRIC;  Surgeon: Kathryne Hitch, MD;  Location: [facility];  Service: Orthopedics;  Laterality: Left;  . Femur im nail Right 03/24/2015    Procedure: CLOSED REDUCTION INTERNAL FIXATION;  Surgeon: Kerrin Champagne, MD;  Location: [facility] OR;  Service: Orthopedics;  Laterality: Right;  . Esophagogastroduodenoscopy N/A 04/21/2015    Procedure: ESOPHAGOGASTRODUODENOSCOPY (EGD);  Surgeon: Willis Modena, MD;  Location: [facility] ENDOSCOPY;  Service: Endoscopy;  Laterality: N/A;  . Givens capsule study N/A 04/21/2015    Procedure: GIVENS CAPSULE STUDY;  Surgeon: Willis Modena, MD;  Location: [facility] ENDOSCOPY;  Service: Endoscopy;  Laterality: N/A;  . Fracture surgery     Social History:   reports that he has quit smoking. His smoking use included Cigarettes. He smoked 1.00 pack per day. He does not have any smokeless tobacco history on file. He reports that he does not drink alcohol or use illicit drugs.  Family History  Problem Relation Age of Onset  . Stroke Mother   . Heart attack Father   .  Heart disease Father   . Cancer Other     FH of Breast Cancer-other Relative  . Heart disease Other     Parent  . Hypertension Other     parent  . Cancer Maternal Uncle     colon    Medications: Patient's Medications  New Prescriptions   No medications on file  Previous Medications   AMINO ACIDS-PROTEIN HYDROLYS (FEEDING SUPPLEMENT, PRO-STAT SUGAR  FREE 64,) LIQD    Take 30 mLs by mouth 2 (two) times daily between meals.   BISACODYL (DULCOLAX) 10 MG SUPPOSITORY    Place 10 mg rectally as needed for moderate constipation.   FUROSEMIDE (LASIX) 20 MG TABLET    Take 20 mg by mouth daily.    HYDROCORTISONE (ANUSOL-HC) 2.5 % RECTAL CREAM    Place rectally 2 (two) times daily as needed for hemorrhoids.   HYDROXYZINE (ATARAX/VISTARIL) 25 MG TABLET    Take 25 mg by mouth every 6 (six) hours as needed for itching.   INSULIN ASPART (NOVOLOG) 100 UNIT/ML INJECTION    Inject 0-9 Units into the skin 3 (three) times daily with meals. Sliding scale CBG 70 - 120: 0 units CBG 121 - 150: 1 unit,  CBG 151 - 200: 2 units,  CBG 201 - 250: 3 units,  CBG 251 - 300: 5 units,  CBG 301 - 350: 7 units,  CBG 351 - 400: 9 units   CBG > 400: 9 units and notify your MD   INSULIN GLARGINE (TOUJEO SOLOSTAR) 300 UNIT/ML SOPN    Inject 12 Units into the skin at bedtime and may repeat dose one time if needed.   LEVOTHYROXINE (SYNTHROID, LEVOTHROID) 25 MCG TABLET    Take 25 mcg by mouth daily.   OXYCODONE (OXY IR/ROXICODONE) 5 MG IMMEDIATE RELEASE TABLET    Take 5 mg by mouth every 6 (six) hours as needed for severe pain.   PANTOPRAZOLE (PROTONIX) 40 MG TABLET    Take 1 tablet (40 mg total) by mouth 2 (two) times daily.   PHENYLEPHRINE-SHARK LIVER OIL-MINERAL OIL-PETROLATUM (PREPARATION H) 0.25-3-14-71.9 % RECTAL OINTMENT    Place 1 application rectally 2 (two) times daily as needed for hemorrhoids.   POLYETHYLENE GLYCOL (MIRALAX / GLYCOLAX) PACKET    Take 17 g by mouth daily as needed for mild constipation.    PROMETHAZINE (PHENERGAN) 12.5 MG TABLET    Take 12.5 mg by mouth every 4 (four) hours as needed for nausea or vomiting.   SODIUM CHLORIDE (OCEAN) 0.65 % SOLN NASAL SPRAY    Place 1 spray into both nostrils 3 (three) times daily as needed (for nosebleed).   SPIRONOLACTONE (ALDACTONE) 25 MG TABLET    Take 50 mg by mouth 2 (two) times daily.    TAMSULOSIN (FLOMAX) 0.4 MG CAPS  CAPSULE    Take 0.4 mg by mouth daily.   Modified Medications   Modified Medication Previous Medication   LACTULOSE (CHRONULAC) 10 GM/15ML SOLUTION lactulose (CHRONULAC) 10 GM/15ML solution      Take 15 mLs (10 g total) by mouth 3 (three) times daily. Titrate for goal 3-4 BM's a day    Take 15 mLs (10 g total) by mouth 2 (two) times daily. Titrate for goal 3-4 BM's a day  Discontinued Medications   AMINO ACIDS-PROTEIN HYDROLYS (FEEDING SUPPLEMENT, PRO-STAT SUGAR FREE 64,) LIQD    Take 30 mLs by mouth daily.   BARRIER CREAM (NON-SPECIFIED) CREA    Apply 1 application topically 2 (two) times daily. For the rash  in perineal area until resolved, then BID as needed   CHOLECALCIFEROL (VITAMIN D3) 1000 UNITS CAPS    Take 1,000 Units by mouth every morning.    DOXYCYCLINE (VIBRA-TABS) 100 MG TABLET    Take 1 tablet (100 mg total) by mouth 2 (two) times daily. X 7days   FEEDING SUPPLEMENT, ENSURE ENLIVE, (ENSURE ENLIVE) LIQD    Take 237 mLs by mouth 2 (two) times daily between meals.   FERROUS SULFATE 325 (65 FE) MG TABLET    Take 325 mg by mouth daily with breakfast.   FLUOXETINE (PROZAC) 20 MG CAPSULE    Take 3 capsules (60 mg total) by mouth daily.   GLUCOSE BLOOD TEST STRIP    1 each by Other route 4 (four) times daily -  before meals and at bedtime. Use as instructed   INSULIN GLARGINE (LANTUS) 100 UNIT/ML INJECTION    Inject 0.05 mLs (5 Units total) into the skin at bedtime.   LAMOTRIGINE (LAMICTAL) 25 MG TABLET    Take 50 mg by mouth 2 (two) times daily.   MECLIZINE (ANTIVERT) 25 MG TABLET    Take 25 mg by mouth daily.   MULTIPLE VITAMIN (MULTIVITAMIN WITH MINERALS) TABS TABLET    Take 1 tablet by mouth daily.   PROMETHAZINE (PHENERGAN) 25 MG TABLET    Take 25 mg by mouth every 6 (six) hours as needed for nausea or vomiting.   SIMVASTATIN (ZOCOR) 40 MG TABLET    Take 40 mg by mouth at bedtime.    TRAZODONE (DESYREL) 50 MG TABLET    Take 1 tablet (50 mg total) by mouth at bedtime as needed for  sleep.     Physical Exam: Filed Vitals:   08/01/15 1442  BP: 138/75  Pulse: 65  Temp: 98.7 F (37.1 C)  TempSrc: Oral  Resp: 20  Weight: 141 lb (63.957 kg)    Physical Exam  Constitutional: He appears well-developed. No distress.  HENT:  Head: Normocephalic and atraumatic. Head is without contusion and without laceration.  Right Ear: External ear normal.  Left Ear: External ear normal.  Nose: Nose normal.  Eyes: Conjunctivae and EOM are normal. Pupils are equal, round, and reactive to light.  Neck: Normal range of motion. Neck supple.  Cardiovascular: Normal rate, regular rhythm and normal heart sounds.   Pulmonary/Chest: Effort normal and breath sounds normal.  Abdominal: Soft. Bowel sounds are normal. He exhibits no distension. There is no tenderness.  Musculoskeletal: He exhibits no edema or tenderness.  Neurological: He is alert. He has normal strength.  Skin: Skin is warm and dry. He is not diaphoretic.  Psychiatric: He has a normal mood and affect.    Labs reviewed: Basic Metabolic Panel:  Recent Labs  16/10/96 0920 05/01/15 0745 05/02/15 0359 06/02/15  NA 138 142 138 136*  K 4.0 3.6 3.2* 4.2  CL 108 112* 111  --   CO2 21* 21* 23  --   GLUCOSE 148* 106* 107*  --   BUN 22* 16 20 34*  CREATININE 0.97 0.95 0.89 1.4*  CALCIUM 8.0* 8.5* 8.0*  --    Liver Function Tests:  Recent Labs  04/30/15 0920 05/01/15 0745 05/02/15 0359  AST 57* 59* 51*  ALT 25 23 22   ALKPHOS 225* 226* 202*  BILITOT 1.2 1.5* 1.1  PROT 7.1 7.4 6.7  ALBUMIN 1.9* 1.9* 1.7*    Recent Labs  01/07/15 0545  LIPASE 26    Recent Labs  04/30/15 1018 05/01/15 0745 05/02/15 0359  AMMONIA 103* 28 53*   CBC:  Recent Labs  03/24/15 0640  03/26/15 0534  04/30/15 0920 05/01/15 0745 05/02/15 0359 06/02/15  WBC 2.3*  < > 4.0  < > 3.2* 3.4* 2.9* 1.9  NEUTROABS 1.8  --  2.9  --  2.5  --   --   --   HGB 7.3*  < > 8.3*  < > 8.9* 9.1* 8.3* 10.0*  HCT 23.2*  < > 25.2*  < > 27.5*  28.5* 25.7* 30*  MCV 92.1  < > 90.3  < > 87.3 88.2 87.1  --   PLT 107*  < > 84*  < > 120* 121* 112* 63*  < > = values in this interval not displayed. TSH:  Recent Labs  01/07/15 0545 04/30/15 1720 06/04/15  TSH 1.728 2.728 1.14   A1C: Lab Results  Component Value Date   HGBA1C 6.3* 06/04/2015   Lipid Panel: No results for input(s): CHOL, HDL, LDLCALC, TRIG, CHOLHDL, LDLDIRECT in the last 8760 hours.    Assessment/Plan  1. Alcoholic cirrhosis of liver without ascites -stable, following with hospice services, conts on lactulose, will follow up ammonia level at this time.   2. Essential hypertension Stable, cont current regimen  3. Gastroesophageal reflux disease without esophagitis Stable at this time, conts on protonix daily   4. Type 2 diabetes mellitus with neurological manifestations, uncontrolled Blood sugars 120-388 fasting. Will increase toujeo to 15 units for better control, conts on novolog with meals No hypoglycemia noted. Will monitor   5. Depression Mood has been stable, without signs of worsening depression. Not currently on medications  6. Hypothyroidism due to acquired atrophy of thyroid TSH stable in June conts on synthroid 25 mcg  Treavon Castilleja K. Biagio Borg  [facility] [phone] 8 am - 5 pm) [phone] (after hours)

## 2015-08-12 ENCOUNTER — Other Ambulatory Visit: Payer: Self-pay

## 2015-08-12 MED ORDER — MORPHINE SULFATE 10 MG/5ML PO SOLN: ORAL | Status: DC

## 2015-08-18 ENCOUNTER — Non-Acute Institutional Stay (SKILLED_NURSING_FACILITY): Payer: Medicare Other | Admitting: Internal Medicine

## 2015-08-18 ENCOUNTER — Encounter: Payer: Self-pay | Admitting: Internal Medicine

## 2015-08-18 DIAGNOSIS — E1165 Type 2 diabetes mellitus with hyperglycemia: Principal | ICD-10-CM

## 2015-08-18 DIAGNOSIS — E1141 Type 2 diabetes mellitus with diabetic mononeuropathy: Secondary | ICD-10-CM

## 2015-08-18 DIAGNOSIS — G9340 Encephalopathy, unspecified: Secondary | ICD-10-CM | POA: Diagnosis not present

## 2015-08-18 DIAGNOSIS — E1149 Type 2 diabetes mellitus with other diabetic neurological complication: Secondary | ICD-10-CM

## 2015-08-18 DIAGNOSIS — IMO0002 Reserved for concepts with insufficient information to code with codable children: Secondary | ICD-10-CM

## 2015-08-18 NOTE — Progress Notes (Signed)
MRN: [id number] Name: CASWELL ALVILLAR  Sex: male Age: 69 y.o. DOB: 10/15/1946  PSC #: [name] Facility/Room: Level Of Care: SNF Provider: Merrilee Seashore D Emergency Contacts: Extended Emergency Contact Information Primary Emergency Contact: [name],[name] Address: [address]          [place], [place] Work Phone: [phone] Mobile Phone: [phone] Relation: Sister  Code Status: DNR  Allergies: Penicillins  Chief Complaint  Patient presents with  . Acute Visit    HPI: Patient is 69 y.o. male who has cirrhosis with chronically elevated ammonia levels, on lactulose, with hx HTN, DM2, GI bleed, hip fracture who is being seen for discussion of high blood sugars and for plans for pt to be out of SNF on Labor day for a field trip.  Past Medical History  Diagnosis Date  . Hyperglycemia   . Hyponatremia   . DMII (diabetes mellitus, type 2)   . Depression   . IBS (irritable bowel syndrome)   . Chest pain   . Unspecified gastritis and gastroduodenitis without mention of hemorrhage   . Arthropathy, unspecified, site unspecified   . History of colon polyps   . Anemia, unspecified   . Anxiety   . GERD (gastroesophageal reflux disease)   . Hyperlipidemia   . Hypertension   . Liver problem     Past Surgical History  Procedure Laterality Date  . Hernia repair  1964  . Intramedullary (im) nail intertrochanteric Left 01/07/2015    Procedure: INTRAMEDULLARY (IM) NAIL INTERTROCHANTRIC;  Surgeon: Kathryne Hitch, MD;  Location: [facility];  Service: Orthopedics;  Laterality: Left;  . Femur im nail Right 03/24/2015    Procedure: CLOSED REDUCTION INTERNAL FIXATION;  Surgeon: Kerrin Champagne, MD;  Location: [facility] OR;  Service: Orthopedics;  Laterality: Right;  . Esophagogastroduodenoscopy N/A 04/21/2015    Procedure: ESOPHAGOGASTRODUODENOSCOPY (EGD);  Surgeon: Willis Modena, MD;  Location: [facility] ENDOSCOPY;  Service: Endoscopy;  Laterality: N/A;  . Givens  capsule study N/A 04/21/2015    Procedure: GIVENS CAPSULE STUDY;  Surgeon: Willis Modena, MD;  Location: [facility] ENDOSCOPY;  Service: Endoscopy;  Laterality: N/A;  . Fracture surgery        Medication List       This list is accurate as of: 08/18/15 11:59 PM.  Always use your most recent med list.               bisacodyl 10 MG suppository  Commonly known as:  DULCOLAX  Place 10 mg rectally as needed for moderate constipation.     feeding supplement (PRO-STAT SUGAR FREE 64) Liqd  Take 30 mLs by mouth 2 (two) times daily between meals.     furosemide 20 MG tablet  Commonly known as:  LASIX  Take 20 mg by mouth daily.     hydrocortisone 2.5 % rectal cream  Commonly known as:  ANUSOL-HC  Place rectally 2 (two) times daily as needed for hemorrhoids.     hydrOXYzine 25 MG tablet  Commonly known as:  ATARAX/VISTARIL  Take 25 mg by mouth every 6 (six) hours as needed for itching.     insulin aspart 100 UNIT/ML injection  Commonly known as:  novoLOG  Inject 0-9 Units into the skin 3 (three) times daily with meals. Sliding scale CBG 70 - 120: 0 units CBG 121 - 150: 1 unit,  CBG 151 - 200: 2 units,  CBG 201 - 250: 3 units,  CBG 251 - 300: 5 units,  CBG 301 -  350: 7 units,  CBG 351 - 400: 9 units   CBG > 400: 9 units and notify your MD     lactulose 10 GM/15ML solution  Commonly known as:  CHRONULAC  Take 15 mLs (10 g total) by mouth 3 (three) times daily. Titrate for goal 3-4 BM's a day     levothyroxine 25 MCG tablet  Commonly known as:  SYNTHROID, LEVOTHROID  Take 25 mcg by mouth daily.     morphine 10 MG/5ML solution  Take 1 ml (2 mg ) by mouth every 12 hours ( control)     oxyCODONE 5 MG immediate release tablet  Commonly known as:  Oxy IR/ROXICODONE  Take 5 mg by mouth every 6 (six) hours as needed for severe pain.     pantoprazole 40 MG tablet  Commonly known as:  PROTONIX  Take 1 tablet (40 mg total) by mouth 2 (two) times daily.     phenylephrine-shark liver oil-mineral  oil-petrolatum 0.25-3-14-71.9 % rectal ointment  Commonly known as:  PREPARATION H  Place 1 application rectally 2 (two) times daily as needed for hemorrhoids.     polyethylene glycol packet  Commonly known as:  MIRALAX / GLYCOLAX  Take 17 g by mouth daily as needed for mild constipation.     promethazine 12.5 MG tablet  Commonly known as:  PHENERGAN  Take 12.5 mg by mouth every 4 (four) hours as needed for nausea or vomiting.     sodium chloride 0.65 % Soln nasal spray  Commonly known as:  OCEAN  Place 1 spray into both nostrils 3 (three) times daily as needed (for nosebleed).     spironolactone 25 MG tablet  Commonly known as:  ALDACTONE  Take 50 mg by mouth 2 (two) times daily.     tamsulosin 0.4 MG Caps capsule  Commonly known as:  FLOMAX  Take 0.4 mg by mouth daily.     TOUJEO SOLOSTAR 300 UNIT/ML Sopn  Generic drug:  Insulin Glargine  Inject 12 Units into the skin at bedtime and may repeat dose one time if needed.        No orders of the defined types were placed in this encounter.    Immunization History  Administered Date(s) Administered  . Influenza-Unspecified 09/13/2011  . PPD Test 01/07/2014  . Pneumococcal Polysaccharide-23 04/22/2015  . Pneumococcal-Unspecified 12/13/2009  . Tdap 12/13/2009    Social History  Substance Use Topics  . Smoking status: Former Smoker -- 1.00 packs/day    Types: Cigarettes  . Smokeless tobacco: Not on file  . Alcohol Use: No    Review of Systems  DATA OBTAINED: from patient,Hospice nurse,family member- sister ; BS are running in 300's sometimes going higher GENERAL:  no fevers, fatigue, appetite changes SKIN: No itching, rash HEENT: No complaint RESPIRATORY: No cough, wheezing, SOB CARDIAC: No chest pain, palpitations, lower extremity edema  GI: No abdominal pain, No N/V/D or constipation, No heartburn or reflux  GU: No dysuria, frequency or urgency, or incontinence  MUSCULOSKELETAL: No unrelieved bone/joint  pain NEUROLOGIC: No headache, dizziness  PSYCHIATRIC: No overt anxiety or sadness  Filed Vitals:   08/18/15 1900  BP: 137/86  Pulse: 82  Temp: 97.6 F (36.4 C)  Resp: 22    Physical Exam  GENERAL APPEARANCE: Alert, conversant, No acute distress  SKIN: No diaphoresis rash HEENT: Unremarkable RESPIRATORY: Breathing is even, unlabored. Lung sounds are clear   CARDIOVASCULAR: Heart RRR no murmurs, rubs or gallops. No peripheral edema  GASTROINTESTINAL: Abdomen is  soft, non-tender, not distended w/ normal bowel sounds, no ascites  GENITOURINARY: Bladder non tender, not distended  MUSCULOSKELETAL: No abnormal joints or musculature NEUROLOGIC: Cranial nerves 2-12 grossly intact. Moves all extremities PSYCHIATRIC: Mood and affect appropriate to situation, no behavioral issues  Patient Active Problem List   Diagnosis Date Noted  . Mental status change 07/05/2015  . Hospice care patient 06/08/2015  . Anemia of chronic disease 05/05/2015  . Acute encephalopathy 05/01/2015  . Metabolic encephalopathy 04/30/2015  . Encounter for family conference without patient present 04/28/2015  . Edema of both legs 04/28/2015  . Acute blood loss anemia 04/19/2015  . GI bleed 04/19/2015  . Closed right hip fracture 03/24/2015  . Abnormal rhythmic movement of tongue 03/24/2015  . Liver cirrhosis 03/24/2015  . Closed intertrochanteric fracture of right hip 03/24/2015    Class: Acute  . Urinary retention 01/15/2015  . CKD (chronic kidney disease) stage 3, GFR 30-59 ml/min 01/15/2015  . Closed left hip fracture 01/07/2015  . Hip fracture, left 01/07/2015  . Hip fracture 01/07/2015  . Hypothyroidism 01/07/2015  . Other pancytopenia 02/23/2013  . Pruritus ani 11/29/2012  . DM (diabetes mellitus), type 2 with complications 11/13/2012  . Type 2 diabetes mellitus with neurological manifestations, uncontrolled 01/24/2012  . Depression 01/24/2012  . Psoriasis 01/24/2012  . Macrocytic anemia   .  Anxiety   . GERD (gastroesophageal reflux disease)   . Hyperlipidemia   . Hypertension     CBC    Component Value Date/Time   WBC 1.9 06/02/2015   WBC 2.9* 05/02/2015 0359   WBC 3.1* 01/23/2015 1313   RBC 2.95* 05/02/2015 0359   RBC 3.32* 01/23/2015 1313   RBC 4.13* 09/27/2009 0545   HGB 10.0* 06/02/2015   HGB 9.4* 01/23/2015 1313   HCT 30* 06/02/2015   HCT 29.9* 01/23/2015 1313   PLT 63* 06/02/2015   PLT 148 01/23/2015 1313   MCV 87.1 05/02/2015 0359   MCV 90.0 01/23/2015 1313   LYMPHSABS 0.4* 04/30/2015 0920   LYMPHSABS 0.4* 01/23/2015 1313   MONOABS 0.2 04/30/2015 0920   MONOABS 0.2 01/23/2015 1313   EOSABS 0.0 04/30/2015 0920   EOSABS 0.1 01/23/2015 1313   BASOSABS 0.0 04/30/2015 0920   BASOSABS 0.0 01/23/2015 1313    CMP     Component Value Date/Time   NA 136* 06/02/2015   NA 138 05/02/2015 0359   NA 138 01/23/2015 1313   K 4.2 06/02/2015   K 4.6 01/23/2015 1313   CL 111 05/02/2015 0359   CO2 23 05/02/2015 0359   CO2 24 01/23/2015 1313   GLUCOSE 107* 05/02/2015 0359   GLUCOSE 224* 01/23/2015 1313   BUN 34* 06/02/2015   BUN 20 05/02/2015 0359   BUN 31.2* 01/23/2015 1313   CREATININE 1.4* 06/02/2015   CREATININE 0.89 05/02/2015 0359   CREATININE 1.2 01/23/2015 1313   CALCIUM 8.0* 05/02/2015 0359   CALCIUM 9.1 01/23/2015 1313   PROT 6.7 05/02/2015 0359   PROT 7.7 01/23/2015 1313   ALBUMIN 1.7* 05/02/2015 0359   ALBUMIN 2.7* 01/23/2015 1313   AST 51* 05/02/2015 0359   AST 57* 01/23/2015 1313   ALT 22 05/02/2015 0359   ALT 26 01/23/2015 1313   ALKPHOS 202* 05/02/2015 0359   ALKPHOS 144 01/23/2015 1313   BILITOT 1.1 05/02/2015 0359   BILITOT 0.90 01/23/2015 1313   GFRNONAA >60 05/02/2015 0359   GFRAA >60 05/02/2015 0359    Assessment and Plan  Type 2 diabetes mellitus with neurological manifestations, uncontrolled  Toujeo had been increased to 15 u and fasting BS are 200-300; will increase toujeo to 18 u qHS; had a long discussion with pt's  sister and POA about changing diet to diabetic diet. Plan would still be fot pt to eat what he wants but diabetic diet may keep baseline BS a little lower so when he does eat a Big Mac it has further to go to be [facility]; sister understands and agrees to change in diet  Acute encephalopathy Pt has had really good MS with lactulose 45 mg BID which pt doesn't like because he has stool all day. Last ammonia level was 309. Pt is going out on Labor day and sister has concern about his having bowel movements when BR is not available. I d/c pt's lactulose for day of field trip and discussed pt might have MS changes without lactulose, that's a risk, but if he does we will give him extra lactulose. She is in agreement.   Time spent with pt > 45; > 50% of time with patient was spent reviewing records, labs, tests and studies, counseling and developing plan of care  Margit Hanks, MD

## 2015-08-19 NOTE — Assessment & Plan Note (Signed)
Pt has had really good MS with lactulose 45 mg BID which pt doesn't like because he has stool all day. Last ammonia level was 309. Pt is going out on Labor day and sister has concern about his having bowel movements when BR is not available. I d/c pt's lactulose for day of field trip and discussed pt might have MS changes without lactulose, that's a risk, but if he does we will give him extra lactulose. She is in agreement.

## 2015-08-19 NOTE — Assessment & Plan Note (Signed)
Toujeo had been increased to 15 u and fasting BS are 200-300; will increase toujeo to 18 u qHS; had a long discussion with pt's sister and POA about changing diet to diabetic diet. Plan would still be fot pt to eat what he wants but diabetic diet may keep baseline BS a little lower so when he does eat a Big Mac it has further to go to be [facility]; sister understands and agrees to change in diet

## 2015-08-29 ENCOUNTER — Encounter: Payer: Self-pay | Admitting: Nurse Practitioner

## 2015-08-29 ENCOUNTER — Non-Acute Institutional Stay (SKILLED_NURSING_FACILITY): Payer: Medicare Other | Admitting: Nurse Practitioner

## 2015-08-29 DIAGNOSIS — F32A Depression, unspecified: Secondary | ICD-10-CM

## 2015-08-29 DIAGNOSIS — E1141 Type 2 diabetes mellitus with diabetic mononeuropathy: Secondary | ICD-10-CM

## 2015-08-29 DIAGNOSIS — K7030 Alcoholic cirrhosis of liver without ascites: Secondary | ICD-10-CM

## 2015-08-29 DIAGNOSIS — E1165 Type 2 diabetes mellitus with hyperglycemia: Principal | ICD-10-CM

## 2015-08-29 DIAGNOSIS — I10 Essential (primary) hypertension: Secondary | ICD-10-CM

## 2015-08-29 DIAGNOSIS — E034 Atrophy of thyroid (acquired): Secondary | ICD-10-CM

## 2015-08-29 DIAGNOSIS — F329 Major depressive disorder, single episode, unspecified: Secondary | ICD-10-CM | POA: Diagnosis not present

## 2015-08-29 DIAGNOSIS — E038 Other specified hypothyroidism: Secondary | ICD-10-CM

## 2015-08-29 DIAGNOSIS — IMO0002 Reserved for concepts with insufficient information to code with codable children: Secondary | ICD-10-CM

## 2015-08-29 DIAGNOSIS — E1149 Type 2 diabetes mellitus with other diabetic neurological complication: Secondary | ICD-10-CM

## 2015-08-29 NOTE — Progress Notes (Signed)
Nursing Home Location:  [facility]   Place of Service: SNF (31)  PCP: Margit Hanks, MD  Allergies  Allergen Reactions  . Penicillins Hives    Chief Complaint  Patient presents with  . Medical Management of Chronic Issues    HPI:  Patient is a 69 y.o. male seen today at [facility] for routine follow up on chronic conditions. 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 pt has been doing well in the last month without acute issues. Eating well. Pharmacy has sent a request due to toujeo not being covered to stop and start levemir.  pts ammonia level was elevated on last labs, however pt has been alert and mental status at baseline. Pt reports his bowels are moving well. Denies worsening pain. Nursing without concerns at this time Review of Systems:  Review of Systems  Constitutional: Negative for activity change, appetite change, fatigue and unexpected weight change.  HENT: Negative for congestion, hearing loss and sore throat.   Eyes: Negative.   Respiratory: Negative for cough and shortness of breath.   Cardiovascular: Negative for chest pain, palpitations and leg swelling.  Gastrointestinal: Negative for abdominal pain, diarrhea and constipation.  Genitourinary: Negative for dysuria, urgency, flank pain and difficulty urinating.  Musculoskeletal: Negative for myalgias and arthralgias.  Skin: Negative for color change, pallor, rash and wound.  Neurological: Negative for weakness, light-headedness and headaches.  Psychiatric/Behavioral: Negative for behavioral problems, confusion and agitation. The patient is not nervous/anxious.     Past Medical History  Diagnosis Date  . Hyperglycemia   . Hyponatremia   . DMII (diabetes mellitus, type 2)   . Depression   .  IBS (irritable bowel syndrome)   . Chest pain   . Unspecified gastritis and gastroduodenitis without mention of hemorrhage   . Arthropathy, unspecified, site unspecified   . History of colon polyps   . Anemia, unspecified   . Anxiety   . GERD (gastroesophageal reflux disease)   . Hyperlipidemia   . Hypertension   . Liver problem    Past Surgical History  Procedure Laterality Date  . Hernia repair  1964  . Intramedullary (im) nail intertrochanteric Left 01/07/2015    Procedure: INTRAMEDULLARY (IM) NAIL INTERTROCHANTRIC;  Surgeon: Kathryne Hitch, MD;  Location: [facility];  Service: Orthopedics;  Laterality: Left;  . Femur im nail Right 03/24/2015    Procedure: CLOSED REDUCTION INTERNAL FIXATION;  Surgeon: Kerrin Champagne, MD;  Location: [facility] OR;  Service: Orthopedics;  Laterality: Right;  . Esophagogastroduodenoscopy N/A 04/21/2015    Procedure: ESOPHAGOGASTRODUODENOSCOPY (EGD);  Surgeon: Willis Modena, MD;  Location: [facility] ENDOSCOPY;  Service: Endoscopy;  Laterality: N/A;  . Givens capsule study N/A 04/21/2015    Procedure: GIVENS CAPSULE STUDY;  Surgeon: Willis Modena, MD;  Location: [facility] ENDOSCOPY;  Service: Endoscopy;  Laterality: N/A;  . Fracture surgery     Social History:   reports that he has quit smoking. His smoking use included Cigarettes. He smoked 1.00 pack per day. He does not have any smokeless tobacco history on file. He reports that he does not drink alcohol or use illicit drugs.  Family History  Problem Relation Age of Onset  . Stroke Mother   . Heart attack Father   . Heart disease Father   . Cancer Other  FH of Breast Cancer-other Relative  . Heart disease Other     Parent  . Hypertension Other     parent  . Cancer Maternal Uncle     colon    Medications: Patient's Medications  New Prescriptions   No medications on file  Previous Medications   AMINO ACIDS-PROTEIN HYDROLYS (FEEDING SUPPLEMENT, PRO-STAT SUGAR FREE 64,) LIQD    Take 30 mLs by mouth 2 (two)  times daily between meals.   BISACODYL (DULCOLAX) 10 MG SUPPOSITORY    Place 10 mg rectally as needed for moderate constipation.   FUROSEMIDE (LASIX) 20 MG TABLET    Take 20 mg by mouth daily.    HYDROCORTISONE (ANUSOL-HC) 2.5 % RECTAL CREAM    Place rectally 2 (two) times daily as needed for hemorrhoids.   HYDROXYZINE (ATARAX/VISTARIL) 25 MG TABLET    Take 25 mg by mouth every 6 (six) hours as needed for itching.   INSULIN ASPART (NOVOLOG) 100 UNIT/ML INJECTION    Inject 0-9 Units into the skin 3 (three) times daily with meals. Sliding scale CBG 70 - 120: 0 units CBG 121 - 150: 1 unit,  CBG 151 - 200: 2 units,  CBG 201 - 250: 3 units,  CBG 251 - 300: 5 units,  CBG 301 - 350: 7 units,  CBG 351 - 400: 9 units   CBG > 400: 9 units and notify your MD   INSULIN GLARGINE (TOUJEO SOLOSTAR) 300 UNIT/ML SOPN    Inject 18 Units into the skin at bedtime and may repeat dose one time if needed.    LACTULOSE (CHRONULAC) 10 GM/15ML SOLUTION    Take 15 mLs (10 g total) by mouth 3 (three) times daily. Titrate for goal 3-4 BM's a day   LEVOTHYROXINE (SYNTHROID, LEVOTHROID) 25 MCG TABLET    Take 25 mcg by mouth daily.   MORPHINE 10 MG/5ML SOLUTION    Take 1 ml (2 mg ) by mouth every 12 hours ( control)   OXYCODONE (OXY IR/ROXICODONE) 5 MG IMMEDIATE RELEASE TABLET    Take 5 mg by mouth every 6 (six) hours as needed for severe pain.   PANTOPRAZOLE (PROTONIX) 40 MG TABLET    Take 1 tablet (40 mg total) by mouth 2 (two) times daily.   PHENYLEPHRINE-SHARK LIVER OIL-MINERAL OIL-PETROLATUM (PREPARATION H) 0.25-3-14-71.9 % RECTAL OINTMENT    Place 1 application rectally 2 (two) times daily as needed for hemorrhoids.   POLYETHYLENE GLYCOL (MIRALAX / GLYCOLAX) PACKET    Take 17 g by mouth daily as needed for mild constipation.    PROMETHAZINE (PHENERGAN) 12.5 MG TABLET    Take 12.5 mg by mouth every 4 (four) hours as needed for nausea or vomiting.   SODIUM CHLORIDE (OCEAN) 0.65 % SOLN NASAL SPRAY    Place 1 spray into both  nostrils 3 (three) times daily as needed (for nosebleed).   SPIRONOLACTONE (ALDACTONE) 25 MG TABLET    Take 50 mg by mouth 2 (two) times daily.    TAMSULOSIN (FLOMAX) 0.4 MG CAPS CAPSULE    Take 0.4 mg by mouth daily.   Modified Medications   No medications on file  Discontinued Medications   No medications on file     Physical Exam: Filed Vitals:   08/29/15 1453  BP: 131/74  Pulse: 53  Temp: 97 F (36.1 C)  Resp: 18  Weight: 147 lb (66.679 kg)    Physical Exam  Constitutional: He appears well-developed. No distress.  HENT:  Head: Normocephalic and atraumatic. Head  is without contusion and without laceration.  Right Ear: External ear normal.  Left Ear: External ear normal.  Nose: Nose normal.  Eyes: Conjunctivae and EOM are normal. Pupils are equal, round, and reactive to light.  Neck: Normal range of motion. Neck supple.  Cardiovascular: Normal rate, regular rhythm and normal heart sounds.   Pulmonary/Chest: Effort normal and breath sounds normal.  Abdominal: Soft. Bowel sounds are normal. He exhibits no distension. There is no tenderness.  Musculoskeletal: He exhibits no edema or tenderness.  Neurological: He is alert. He has normal strength.  Skin: Skin is warm and dry. He is not diaphoretic.  Psychiatric: He has a normal mood and affect.    Labs reviewed: Basic Metabolic Panel:  Recent Labs  16/10/96 0920 05/01/15 0745 05/02/15 0359 06/02/15  NA 138 142 138 136*  K 4.0 3.6 3.2* 4.2  CL 108 112* 111  --   CO2 21* 21* 23  --   GLUCOSE 148* 106* 107*  --   BUN 22* 16 20 34*  CREATININE 0.97 0.95 0.89 1.4*  CALCIUM 8.0* 8.5* 8.0*  --    Liver Function Tests:  Recent Labs  04/30/15 0920 05/01/15 0745 05/02/15 0359  AST 57* 59* 51*  ALT 25 23 22   ALKPHOS 225* 226* 202*  BILITOT 1.2 1.5* 1.1  PROT 7.1 7.4 6.7  ALBUMIN 1.9* 1.9* 1.7*    Recent Labs  01/07/15 0545  LIPASE 26    Recent Labs  04/30/15 1018 05/01/15 0745 05/02/15 0359  AMMONIA  103* 28 53*   CBC:  Recent Labs  03/24/15 0640  03/26/15 0534  04/30/15 0920 05/01/15 0745 05/02/15 0359 06/02/15  WBC 2.3*  < > 4.0  < > 3.2* 3.4* 2.9* 1.9  NEUTROABS 1.8  --  2.9  --  2.5  --   --   --   HGB 7.3*  < > 8.3*  < > 8.9* 9.1* 8.3* 10.0*  HCT 23.2*  < > 25.2*  < > 27.5* 28.5* 25.7* 30*  MCV 92.1  < > 90.3  < > 87.3 88.2 87.1  --   PLT 107*  < > 84*  < > 120* 121* 112* 63*  < > = values in this interval not displayed. TSH:  Recent Labs  01/07/15 0545 04/30/15 1720 06/04/15  TSH 1.728 2.728 1.14   A1C: Lab Results  Component Value Date   HGBA1C 6.3* 06/04/2015   Lipid Panel: No results for input(s): CHOL, HDL, LDLCALC, TRIG, CHOLHDL, LDLDIRECT in the last 8760 hours.  Assessment/Plan 1. Type 2 diabetes mellitus with neurological manifestations, uncontrolled -blood sugars remain elevated, will stop toujeo due to pharmacy coverage and start levemir 20 units daily -cont SSI  2. Alcoholic cirrhosis of liver without ascites -will follow up ammonia level, conts on lactulose TID -mental status has been stable.  -conts to be followed by hospice   3. Essential hypertension -blood pressure stable, cont on current regimen  -follow up BMP  4. Hypothyroidism due to acquired atrophy of thyroid -conts on synthroid 25 mcg daily, TSH at within normal limits in June  5. Depression -mood has been stable, no signs of depression or anxiety  6. GERD -stable, conts on protonix twice daily    Jessica K. Biagio Borg  [facility] [phone] 8 am - 5 pm) [phone] (after hours)

## 2015-09-17 ENCOUNTER — Other Ambulatory Visit: Payer: Self-pay | Admitting: *Deleted

## 2015-09-17 MED ORDER — OXYCODONE HCL 5 MG PO TABS: 5.0000 mg | ORAL_TABLET | Freq: Four times a day (QID) | ORAL | Status: DC | PRN

## 2015-10-10 ENCOUNTER — Non-Acute Institutional Stay (SKILLED_NURSING_FACILITY): Payer: Medicare Other | Admitting: Nurse Practitioner

## 2015-10-10 DIAGNOSIS — E038 Other specified hypothyroidism: Secondary | ICD-10-CM | POA: Diagnosis not present

## 2015-10-10 DIAGNOSIS — K219 Gastro-esophageal reflux disease without esophagitis: Secondary | ICD-10-CM

## 2015-10-10 DIAGNOSIS — IMO0002 Reserved for concepts with insufficient information to code with codable children: Secondary | ICD-10-CM

## 2015-10-10 DIAGNOSIS — K7030 Alcoholic cirrhosis of liver without ascites: Secondary | ICD-10-CM

## 2015-10-10 DIAGNOSIS — L299 Pruritus, unspecified: Secondary | ICD-10-CM

## 2015-10-10 DIAGNOSIS — E1140 Type 2 diabetes mellitus with diabetic neuropathy, unspecified: Secondary | ICD-10-CM

## 2015-10-10 DIAGNOSIS — Z794 Long term (current) use of insulin: Secondary | ICD-10-CM

## 2015-10-10 DIAGNOSIS — E1165 Type 2 diabetes mellitus with hyperglycemia: Secondary | ICD-10-CM | POA: Diagnosis not present

## 2015-10-10 DIAGNOSIS — E034 Atrophy of thyroid (acquired): Secondary | ICD-10-CM

## 2015-10-10 NOTE — Progress Notes (Signed)
Nursing Home Location:  [facility]   Place of Service: SNF (31)  PCP: Margit Hanks, MD  Allergies  Allergen Reactions  . Penicillins Hives    Chief Complaint  Patient presents with  . Medical Management of Chronic Issues    HPI:  Patient is a 69 y.o. male seen today at [facility] for routine follow up on chronic conditions. 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 Over the last month pt has had significant decline however has improved and now doing well. Self propelling around [employer] eating well and mentation at previous baseline.  Pt denies pain. Reports itching but no rash noted.  Ongoing diarrhea from lactulose but stable.   Review of Systems:  Review of Systems  Constitutional: Negative for activity change, appetite change, fatigue and unexpected weight change.  HENT: Negative for congestion, hearing loss and sore throat.   Eyes: Negative.   Respiratory: Negative for cough and shortness of breath.   Cardiovascular: Negative for chest pain, palpitations and leg swelling.  Gastrointestinal: Negative for abdominal pain, diarrhea and constipation.  Genitourinary: Negative for dysuria, urgency, flank pain and difficulty urinating.  Musculoskeletal: Negative for myalgias and arthralgias.  Skin: Negative for color change, pallor, rash and wound.  Neurological: Negative for weakness, light-headedness and headaches.  Psychiatric/Behavioral: Negative for behavioral problems, confusion and agitation. The patient is not nervous/anxious.     Past Medical History  Diagnosis Date  . Hyperglycemia   . Hyponatremia   . DMII (diabetes mellitus, type 2)   . Depression   . IBS (irritable bowel syndrome)   . Chest pain   . Unspecified gastritis and  gastroduodenitis without mention of hemorrhage   . Arthropathy, unspecified, site unspecified   . History of colon polyps   . Anemia, unspecified   . Anxiety   . GERD (gastroesophageal reflux disease)   . Hyperlipidemia   . Hypertension   . Liver problem    Past Surgical History  Procedure Laterality Date  . Hernia repair  1964  . Intramedullary (im) nail intertrochanteric Left 01/07/2015    Procedure: INTRAMEDULLARY (IM) NAIL INTERTROCHANTRIC;  Surgeon: Kathryne Hitch, MD;  Location: [facility];  Service: Orthopedics;  Laterality: Left;  . Femur im nail Right 03/24/2015    Procedure: CLOSED REDUCTION INTERNAL FIXATION;  Surgeon: Kerrin Champagne, MD;  Location: [facility] OR;  Service: Orthopedics;  Laterality: Right;  . Esophagogastroduodenoscopy N/A 04/21/2015    Procedure: ESOPHAGOGASTRODUODENOSCOPY (EGD);  Surgeon: Willis Modena, MD;  Location: [facility] END[facility]COPY;  Service: Endoscopy;  Laterality: N/A;  . Givens capsule study N/A 04/21/2015    Procedure: GIVENS CAPSULE STUDY;  Surgeon: Willis Modena, MD;  Location: [facility] ENDOSCOPY;  Service: Endoscopy;  Laterality: N/A;  . Fracture surgery     Social History:   reports that he has quit smoking. His smoking use included Cigarettes. He smoked 1.00 pack per day. He does not have any smokeless tobacco history on file. He reports that he does not drink alcohol or use illicit drugs.  Family History  Problem Relation Age of Onset  . Stroke Mother   . Heart attack Father   . Heart disease Father   . Cancer Other     FH of Breast Cancer-other Relative  . Heart disease Other     Parent  . Hypertension Other  parent  . Cancer Maternal Uncle     colon    Medications: Patient's Medications  New Prescriptions   No medications on file  Previous Medications   AMINO ACIDS-PROTEIN HYDROLYS (FEEDING SUPPLEMENT, PRO-STAT SUGAR FREE 64,) LIQD    Take 30 mLs by mouth 2 (two) times daily between meals.   BISACODYL (DULCOLAX) 10 MG SUPPOSITORY    Place 10  mg rectally as needed for moderate constipation.   FUROSEMIDE (LASIX) 20 MG TABLET    Take 20 mg by mouth daily.    HYDROCORTISONE (ANUSOL-HC) 2.5 % RECTAL CREAM    Place rectally 2 (two) times daily as needed for hemorrhoids.   HYDROXYZINE (ATARAX/VISTARIL) 25 MG TABLET    Take 25 mg by mouth every 6 (six) hours as needed for itching.   INSULIN ASPART (NOVOLOG) 100 UNIT/ML INJECTION    Inject 0-9 Units into the skin 3 (three) times daily with meals. Sliding scale CBG 70 - 120: 0 units CBG 121 - 150: 1 unit,  CBG 151 - 200: 2 units,  CBG 201 - 250: 3 units,  CBG 251 - 300: 5 units,  CBG 301 - 350: 7 units,  CBG 351 - 400: 9 units   CBG > 400: 9 units and notify your MD   INSULIN GLARGINE (TOUJEO SOLOSTAR) 300 UNIT/ML SOPN    Inject 18 Units into the skin at bedtime and may repeat dose one time if needed.    LACTULOSE (CHRONULAC) 10 GM/15ML SOLUTION    Take 15 mLs (10 g total) by mouth 3 (three) times daily. Titrate for goal 3-4 BM's a day   LEVOTHYROXINE (SYNTHROID, LEVOTHROID) 25 MCG TABLET    Take 25 mcg by mouth daily.   MORPHINE 10 MG/5ML SOLUTION    Take 1 ml (2 mg ) by mouth every 12 hours ( control)   OXYCODONE (OXY IR/ROXICODONE) 5 MG IMMEDIATE RELEASE TABLET    Take 1 tablet (5 mg total) by mouth every 6 (six) hours as needed for severe pain.   PANTOPRAZOLE (PROTONIX) 40 MG TABLET    Take 1 tablet (40 mg total) by mouth 2 (two) times daily.   PHENYLEPHRINE-SHARK LIVER OIL-MINERAL OIL-PETROLATUM (PREPARATION H) 0.25-3-14-71.9 % RECTAL OINTMENT    Place 1 application rectally 2 (two) times daily as needed for hemorrhoids.   POLYETHYLENE GLYCOL (MIRALAX / GLYCOLAX) PACKET    Take 17 g by mouth daily as needed for mild constipation.    PROMETHAZINE (PHENERGAN) 12.5 MG TABLET    Take 12.5 mg by mouth every 4 (four) hours as needed for nausea or vomiting.   SODIUM CHLORIDE (OCEAN) 0.65 % SOLN NASAL SPRAY    Place 1 spray into both nostrils 3 (three) times daily as needed (for nosebleed).    SPIRONOLACTONE (ALDACTONE) 25 MG TABLET    Take 50 mg by mouth 2 (two) times daily.    TAMSULOSIN (FLOMAX) 0.4 MG CAPS CAPSULE    Take 0.4 mg by mouth daily.   Modified Medications   No medications on file  Discontinued Medications   No medications on file     Physical Exam: Filed Vitals:   10/10/15 1449  BP: 91/58  Pulse: 76  Temp: 97.7 F (36.5 C)  Resp: 20  Weight: 145 lb (65.772 kg)    Physical Exam  Constitutional: He appears well-developed. No distress.  HENT:  Head: Normocephalic and atraumatic. Head is without contusion and without laceration.  Right Ear: External ear normal.  Left Ear: External ear normal.  Nose:  Nose normal.  Eyes: Conjunctivae and EOM are normal. Pupils are equal, round, and reactive to light.  Neck: Normal range of motion. Neck supple.  Cardiovascular: Normal rate, regular rhythm and normal heart sounds.   Pulmonary/Chest: Effort normal and breath sounds normal.  Abdominal: Soft. Bowel sounds are normal. He exhibits no distension. There is no tenderness.  Musculoskeletal: He exhibits no edema or tenderness.  Neurological: He is alert. He has normal strength.  Skin: Skin is warm and dry. He is not diaphoretic.  Psychiatric: He has a normal mood and affect.    Labs reviewed: Basic Metabolic Panel:  Recent Labs  40/98/11 0920 05/01/15 0745 05/02/15 0359 06/02/15  NA 138 142 138 136*  K 4.0 3.6 3.2* 4.2  CL 108 112* 111  --   CO2 21* 21* 23  --   GLUCOSE 148* 106* 107*  --   BUN 22* 16 20 34*  CREATININE 0.97 0.95 0.89 1.4*  CALCIUM 8.0* 8.5* 8.0*  --    Liver Function Tests:  Recent Labs  04/30/15 0920 05/01/15 0745 05/02/15 0359  AST 57* 59* 51*  ALT ALKPHOS 225* 226* 202*  BILITOT 1.2 1.5* 1.1  PROT 7.1 7.4 6.7  ALBUMIN 1.9* 1.9* 1.7*    Recent Labs  01/07/15 0545  LIPASE 26    Recent Labs  04/30/15 1018 05/01/15 0745 05/02/15 0359  AMMONIA 103* 28 53*   CBC:  Recent Labs  03/24/15 0640   03/26/15 0534  04/30/15 0920 05/01/15 0745 05/02/15 0359 06/02/15  WBC 2.3*  < > 4.0  < > 3.2* 3.4* 2.9* 1.9  NEUTROABS 1.8  --  2.9  --  2.5  --   --   --   HGB 7.3*  < > 8.3*  < > 8.9* 9.1* 8.3* 10.0*  HCT 23.2*  < > 25.2*  < > 27.5* 28.5* 25.7* 30*  MCV 92.1  < > 90.3  < > 87.3 88.2 87.1  --   PLT 107*  < > 84*  < > 120* 121* 112* 63*  < > = values in this interval not displayed. TSH:  Recent Labs  01/07/15 0545 04/30/15 1720 06/04/15  TSH 1.728 2.728 1.14   A1C: Lab Results  Component Value Date   HGBA1C 6.3* 06/04/2015   Lipid Panel: No results for input(s): CHOL, HDL, LDLCALC, TRIG, CHOLHDL, LDLDIRECT in the last 8760 hours.  Assessment/Plan  1. Alcoholic cirrhosis of liver without ascites (HCC) Stable, Remains on hospice due to overall decline. conts on lactulose   2. Uncontrolled type 2 diabetes mellitus with diabetic neuropathy, with long-term current use of insulin (HCC) -blood sugars stable at this time, conts toujeo and SSI  3. Hypothyroidism due to acquired atrophy of thyroid Stable on synthroid 25 mcg  4. Gastroesophageal reflux disease without esophagitis -stable on protonix 40 mg daily  5. Pruritus of skin -with ongoing itching of the skin, encouraged hydration and lotion -no rash noted will dc triamcinolone and monitor for now   Bianey Tesoro K. Biagio Borg  [facility] [phone] 8 am - 5 pm) [phone] (after hours)

## 2015-11-12 ENCOUNTER — Encounter: Payer: Self-pay | Admitting: Nurse Practitioner

## 2015-11-12 ENCOUNTER — Non-Acute Institutional Stay (SKILLED_NURSING_FACILITY): Payer: Medicare Other | Admitting: Nurse Practitioner

## 2015-11-12 DIAGNOSIS — I10 Essential (primary) hypertension: Secondary | ICD-10-CM | POA: Diagnosis not present

## 2015-11-12 DIAGNOSIS — F329 Major depressive disorder, single episode, unspecified: Secondary | ICD-10-CM

## 2015-11-12 DIAGNOSIS — IMO0002 Reserved for concepts with insufficient information to code with codable children: Secondary | ICD-10-CM

## 2015-11-12 DIAGNOSIS — E1165 Type 2 diabetes mellitus with hyperglycemia: Secondary | ICD-10-CM

## 2015-11-12 DIAGNOSIS — K7030 Alcoholic cirrhosis of liver without ascites: Secondary | ICD-10-CM | POA: Diagnosis not present

## 2015-11-12 DIAGNOSIS — F32A Depression, unspecified: Secondary | ICD-10-CM

## 2015-11-12 DIAGNOSIS — N400 Enlarged prostate without lower urinary tract symptoms: Secondary | ICD-10-CM

## 2015-11-12 DIAGNOSIS — E1140 Type 2 diabetes mellitus with diabetic neuropathy, unspecified: Secondary | ICD-10-CM

## 2015-11-12 DIAGNOSIS — L299 Pruritus, unspecified: Secondary | ICD-10-CM | POA: Diagnosis not present

## 2015-11-12 DIAGNOSIS — Z794 Long term (current) use of insulin: Secondary | ICD-10-CM | POA: Diagnosis not present

## 2015-11-12 NOTE — Progress Notes (Signed)
Patient ID: [name], male   DOB: [birth year], 69 y.o.   MRN: [id number]    Nursing Home Location:  [facility]   Place of Service: SNF (31)  PCP: Margit Hanks, MD  Allergies  Allergen Reactions  . Penicillins Hives    Chief Complaint  Patient presents with  . Medical Management of Chronic Issues    Routine Visit- Hospice Patient     HPI:  Patient is a 69 y.o. male seen today at [facility] for routine follow up on chronic conditions. 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 Pt has stable in the last month however increase in behaviors and depression noted. Pt reports he is down most of the time. conts to take medication has prescribed. Having loose stools but no increase in diarrhea or abdominal discomfort. Pt denies pain at this time. Blood sugars remain elevated. Good appetite.   Review of Systems:  Review of Systems  Constitutional: Negative for activity change, appetite change, fatigue and unexpected weight change.  HENT: Negative for congestion, hearing loss and sore throat.   Eyes: Negative.   Respiratory: Negative for cough and shortness of breath.   Cardiovascular: Negative for chest pain, palpitations and leg swelling.  Gastrointestinal: Negative for abdominal pain, diarrhea and constipation.  Genitourinary: Negative for dysuria, urgency, flank pain and difficulty urinating.  Musculoskeletal: Negative for myalgias and arthralgias.  Skin: Negative for color change, pallor, rash and wound.  Neurological: Negative for weakness, light-headedness and headaches.  Psychiatric/Behavioral: Positive for behavioral problems, confusion and agitation. The patient is not nervous/anxious.     Past Medical History  Diagnosis Date  . Hyperglycemia   .  Hyponatremia   . DMII (diabetes mellitus, type 2) (HCC)   . Depression   . IBS (irritable bowel syndrome)   . Chest pain   . Unspecified gastritis and gastroduodenitis without mention of hemorrhage   . Arthropathy, unspecified, site unspecified   . History of colon polyps   . Anemia, unspecified   . Anxiety   . GERD (gastroesophageal reflux disease)   . Hyperlipidemia   . Hypertension   . Liver problem    Past Surgical History  Procedure Laterality Date  . Hernia repair  1964  . Intramedullary (im) nail intertrochanteric Left 01/07/2015    Procedure: INTRAMEDULLARY (IM) NAIL INTERTROCHANTRIC;  Surgeon: Kathryne Hitch, MD;  Location: [facility];  Service: Orthopedics;  Laterality: Left;  . Femur im nail Right 03/24/2015    Procedure: CLOSED REDUCTION INTERNAL FIXATION;  Surgeon: Kerrin Champagne, MD;  Location: [facility] OR;  Service: Orthopedics;  Laterality: Right;  . Esophagogastroduodenoscopy N/A 04/21/2015    Procedure: ESOPHAGOGASTRODUODENOSCOPY (EGD);  Surgeon: Willis Modena, MD;  Location: [facility] ENDOSCOPY;  Service: Endoscopy;  Laterality: N/A;  . Givens capsule study N/A 04/21/2015    Procedure: GIVENS CAPSULE STUDY;  Surgeon: Willis Modena, MD;  Location: [facility] ENDOSCOPY;  Service: Endoscopy;  Laterality: N/A;  . Fracture surgery     Social History:   reports that he has quit smoking. His smoking use included Cigarettes. He smoked 1.00 pack per day. He does not have any smokeless tobacco history on file. He reports that he does not drink alcohol or use illicit drugs.  Family History  Problem Relation Age of Onset  . Stroke Mother   . Heart attack Father   .  Heart disease Father   . Cancer Other     FH of Breast Cancer-other Relative  . Heart disease Other     Parent  . Hypertension Other     parent  . Cancer Maternal Uncle     colon    Medications: Patient's Medications  New Prescriptions   No medications on file  Previous Medications   BISACODYL (DULCOLAX) 10 MG  SUPPOSITORY    Place 10 mg rectally as needed for moderate constipation.   CARBAMIDE PEROXIDE (DEBROX) 6.5 % OTIC SOLUTION    Place 5 drops into both ears 2 (two) times daily. X 3 days starting 11/11/15   DIPHENHYDRAMINE (BENADRYL) 12.5 MG CHEWABLE TABLET    Chew 12.5 mg by mouth every 6 (six) hours as needed for allergies.   FUROSEMIDE (LASIX) 20 MG TABLET    Take 20 mg by mouth daily.    INSULIN ASPART (NOVOLOG) 100 UNIT/ML INJECTION    Inject 0-9 Units into the skin 3 (three) times daily with meals. Sliding scale CBG 70 - 120: 0 units CBG 121 - 150: 1 unit,  CBG 151 - 200: 2 units,  CBG 201 - 250: 3 units,  CBG 251 - 300: 5 units,  CBG 301 - 350: 7 units,  CBG 351 - 400: 9 units   CBG > 400: 9 units and notify your MD   INSULIN DETEMIR (LEVEMIR FLEXPEN) 100 UNIT/ML PEN    Inject 20 Units into the skin at bedtime.   LACTULOSE (CHRONULAC) 10 GM/15ML SOLUTION    Take 15 mLs (10 g total) by mouth 3 (three) times daily. Titrate for goal 3-4 BM's a day   LEVOTHYROXINE (SYNTHROID, LEVOTHROID) 25 MCG TABLET    Take 25 mcg by mouth daily.   MORPHINE SULFATE (MORPHINE CONCENTRATE) 10 MG / 0.5 ML CONCENTRATED SOLUTION    Take 5 mg by mouth every 6 (six) hours as needed for severe pain. And 2 mg by mouth ever 12 hours scheduled   OXYCODONE (OXY IR/ROXICODONE) 5 MG IMMEDIATE RELEASE TABLET    Take 1 tablet (5 mg total) by mouth every 6 (six) hours as needed for severe pain.   PHENYLEPHRINE-SHARK LIVER OIL-MINERAL OIL-PETROLATUM (PREPARATION H) 0.25-3-14-71.9 % RECTAL OINTMENT    Place 1 application rectally 2 (two) times daily as needed for hemorrhoids.   POLLEN EXTRACTS (PROSTAT PO)    Take 30 mLs by mouth daily.   POLYETHYLENE GLYCOL (MIRALAX / GLYCOLAX) PACKET    Take 17 g by mouth daily as needed for mild constipation.    PROMETHAZINE (PHENERGAN) 12.5 MG TABLET    Take 12.5 mg by mouth every 4 (four) hours as needed for nausea or vomiting.   SKIN PROTECTANTS, MISC. (EUCERIN) CREAM    Apply 1 application  topically 2 (two) times daily.   SODIUM CHLORIDE (OCEAN) 0.65 % SOLN NASAL SPRAY    Place 1 spray into both nostrils 3 (three) times daily as needed (for nosebleed).   SPIRONOLACTONE (ALDACTONE) 50 MG TABLET    Take 50 mg by mouth 2 (two) times daily.   TAMSULOSIN (FLOMAX) 0.4 MG CAPS CAPSULE    Take 0.4 mg by mouth daily.   Modified Medications   No medications on file  Discontinued Medications   AMINO ACIDS-PROTEIN HYDROLYS (FEEDING SUPPLEMENT, PRO-STAT SUGAR FREE 64,) LIQD    Take 30 mLs by mouth 2 (two) times daily between meals.   HYDROCORTISONE (ANUSOL-HC) 2.5 % RECTAL CREAM    Place rectally 2 (two) times daily as needed  for hemorrhoids.   HYDROXYZINE (ATARAX/VISTARIL) 25 MG TABLET    Take 25 mg by mouth every 6 (six) hours as needed for itching.   INSULIN GLARGINE (TOUJEO SOLOSTAR) 300 UNIT/ML SOPN    Inject 18 Units into the skin at bedtime and may repeat dose one time if needed.    MORPHINE 10 MG/5ML SOLUTION    Take 1 ml (2 mg ) by mouth every 12 hours ( control)   PANTOPRAZOLE (PROTONIX) 40 MG TABLET    Take 1 tablet (40 mg total) by mouth 2 (two) times daily.   SPIRONOLACTONE (ALDACTONE) 25 MG TABLET    Take 50 mg by mouth 2 (two) times daily.      Physical Exam: Filed Vitals:   11/12/15 1118  BP: 122/64  Pulse: 80  Temp: 98.2 F (36.8 C)  TempSrc: Oral  Resp: 18  Height: 5\' 6"  (1.676 m)  Weight: 143 lb (64.864 kg)    Physical Exam  Constitutional: He appears well-developed. No distress.  HENT:  Head: Normocephalic and atraumatic. Head is without contusion and without laceration.  Mouth/Throat: No oropharyngeal exudate.  Eyes: Conjunctivae and EOM are normal. Pupils are equal, round, and reactive to light.  Neck: Normal range of motion. Neck supple.  Cardiovascular: Normal rate, regular rhythm and normal heart sounds.   Pulmonary/Chest: Effort normal and breath sounds normal.  Abdominal: Soft. Bowel sounds are normal. He exhibits no distension. There is no  tenderness.  Musculoskeletal: He exhibits no edema or tenderness.  Neurological: He is alert. He has normal strength.  Skin: Skin is warm and dry. He is not diaphoretic.  Psychiatric: He has a normal mood and affect.    Labs reviewed: Basic Metabolic Panel:  Recent Labs  16/10/96 0920 05/01/15 0745 05/02/15 0359 06/02/15  NA 138 142 138 136*  K 4.0 3.6 3.2* 4.2  CL 108 112* 111  --   CO2 21* 21* 23  --   GLUCOSE 148* 106* 107*  --   BUN 22* 16 20 34*  CREATININE 0.97 0.95 0.89 1.4*  CALCIUM 8.0* 8.5* 8.0*  --    Liver Function Tests:  Recent Labs  04/30/15 0920 05/01/15 0745 05/02/15 0359  AST 57* 59* 51*  ALT 25 23 22   ALKPHOS 225* 226* 202*  BILITOT 1.2 1.5* 1.1  PROT 7.1 7.4 6.7  ALBUMIN 1.9* 1.9* 1.7*    Recent Labs  01/07/15 0545  LIPASE 26    Recent Labs  04/30/15 1018 05/01/15 0745 05/02/15 0359  AMMONIA 103* 28 53*   CBC:  Recent Labs  03/24/15 0640  03/26/15 0534  04/30/15 0920 05/01/15 0745 05/02/15 0359 06/02/15  WBC 2.3*  < > 4.0  < > 3.2* 3.4* 2.9* 1.9  NEUTROABS 1.8  --  2.9  --  2.5  --   --   --   HGB 7.3*  < > 8.3*  < > 8.9* 9.1* 8.3* 10.0*  HCT 23.2*  < > 25.2*  < > 27.5* 28.5* 25.7* 30*  MCV 92.1  < > 90.3  < > 87.3 88.2 87.1  --   PLT 107*  < > 84*  < > 120* 121* 112* 63*  < > = values in this interval not displayed. TSH:  Recent Labs  01/07/15 0545 04/30/15 1720 06/04/15  TSH 1.728 2.728 1.14   A1C: Lab Results  Component Value Date   HGBA1C 6.3* 06/04/2015   Lipid Panel: No results for input(s): CHOL, HDL, LDLCALC, TRIG, CHOLHDL, LDLDIRECT in  the last 8760 hours.    Assessment/Plan 1. Alcoholic cirrhosis of liver without ascites (HCC) -ongoing, stable at this time, conts on lactulose with good bowel movements. Will cont current regimen at this time  2. Uncontrolled type 2 diabetes mellitus with diabetic neuropathy, with long-term current use of insulin (HCC) -blood sugars remain elevated, will increase  levemir to 24 units daily, to cont novolog and have staff to cont to monitor CBGs   3. Pruritus of skin -ongoing issue, no rash noted. Encouraged proper hydration and lotion to skin. Has benadryl q 6 hours PRN    4. BPH (benign prostatic hyperplasia) Stable, conts on flomax 0.4 mg daily   5. Essential hypertension Blood pressure stable, conts on aldactone BID and lasix for fluid and blood pressure  6. Depression -overall depression stable however somedays pt with increased confusion and agitation which could be related to confusion with cirrhosis -will get psych consult     Janene HarveyJessica K. Biagio BorgEubanks, AGNP  [facility] [phone](Monday-Friday 8 am - 5 pm) [phone] (after hours)

## 2015-12-05 ENCOUNTER — Non-Acute Institutional Stay (SKILLED_NURSING_FACILITY): Payer: Medicare Other | Admitting: Nurse Practitioner

## 2015-12-05 DIAGNOSIS — F29 Unspecified psychosis not due to a substance or known physiological condition: Secondary | ICD-10-CM | POA: Diagnosis not present

## 2015-12-05 DIAGNOSIS — IMO0002 Reserved for concepts with insufficient information to code with codable children: Secondary | ICD-10-CM

## 2015-12-05 DIAGNOSIS — Z794 Long term (current) use of insulin: Secondary | ICD-10-CM

## 2015-12-05 DIAGNOSIS — T148 Other injury of unspecified body region: Secondary | ICD-10-CM

## 2015-12-05 DIAGNOSIS — T148XXA Other injury of unspecified body region, initial encounter: Secondary | ICD-10-CM

## 2015-12-05 DIAGNOSIS — E1140 Type 2 diabetes mellitus with diabetic neuropathy, unspecified: Secondary | ICD-10-CM | POA: Diagnosis not present

## 2015-12-05 DIAGNOSIS — K7030 Alcoholic cirrhosis of liver without ascites: Secondary | ICD-10-CM

## 2015-12-05 DIAGNOSIS — E1165 Type 2 diabetes mellitus with hyperglycemia: Secondary | ICD-10-CM

## 2015-12-05 NOTE — Progress Notes (Signed)
Patient ID: [name], male   DOB: [birth year], 69 y.o.   MRN: [id number]    Nursing Home Location:  [facility]   Place of Service: SNF (31)  PCP: Margit Hanks, MD  Allergies  Allergen Reactions  . Penicillins Hives    Chief Complaint  Patient presents with  . Acute Visit    HPI:  Patient is a 69 y.o. male seen today at [facility] for acute visit at the request of nursing. Pt with a pmh of history of hypertension,diabetes, hypothyroidism, CKD, bilateral hip fractures s/p repair and depression. Pt being followed by hospice due to end stage cirrhosis and hepatic encephalopathy. Pt recent seen by psych and started on multiple medications (seroquel, Lamictal and fluoxetine) due to PTSD with depression and threatening comments to staff.  Pt was doing well until this morning and pt was more lethargic and shaky and stated he did not feel well. Sister at bedside and concerned he has not been getting lactulose.  Also concerns of blood in groin when wiped by CNA, pt denies pain.  Pt reports 2 BMs a day, no changes to bowel movements. No constipation or diarrhea.  Pt denies shortness of breath, chest pains.  Reports weakness.   Review of Systems:  Review of Systems  Constitutional: Positive for fatigue. Negative for activity change, appetite change and unexpected weight change.  HENT: Negative for congestion, hearing loss and sore throat.   Eyes: Negative.   Respiratory: Negative for cough and shortness of breath.   Cardiovascular: Negative for chest pain, palpitations and leg swelling.  Gastrointestinal: Negative for abdominal pain, diarrhea and constipation.  Genitourinary: Negative for urgency and difficulty urinating.  Musculoskeletal: Negative for myalgias and arthralgias.  Skin: Negative for color change, pallor, rash and wound.  Neurological: Positive for tremors and weakness. Negative for dizziness, light-headedness and headaches.    Psychiatric/Behavioral: Positive for behavioral problems, confusion and agitation. The patient is not nervous/anxious.     Past Medical History  Diagnosis Date  . Hyperglycemia   . Hyponatremia   . DMII (diabetes mellitus, type 2) (HCC)   . Depression   . IBS (irritable bowel syndrome)   . Chest pain   . Unspecified gastritis and gastroduodenitis without mention of hemorrhage   . Arthropathy, unspecified, site unspecified   . History of colon polyps   . Anemia, unspecified   . Anxiety   . GERD (gastroesophageal reflux disease)   . Hyperlipidemia   . Hypertension   . Liver problem    Past Surgical History  Procedure Laterality Date  . Hernia repair  1964  . Intramedullary (im) nail intertrochanteric Left 01/07/2015    Procedure: INTRAMEDULLARY (IM) NAIL INTERTROCHANTRIC;  Surgeon: Kathryne Hitch, MD;  Location: [facility];  Service: Orthopedics;  Laterality: Left;  . Femur im nail Right 03/24/2015    Procedure: CLOSED REDUCTION INTERNAL FIXATION;  Surgeon: Kerrin Champagne, MD;  Location: [facility] OR;  Service: Orthopedics;  Laterality: Right;  . Esophagogastroduodenoscopy N/A 04/21/2015    Procedure: ESOPHAGOGASTRODUODENOSCOPY (EGD);  Surgeon: Willis Modena, MD;  Location: [facility] ENDOSCOPY;  Service: Endoscopy;  Laterality: N/A;  . Givens capsule study N/A 04/21/2015    Procedure: GIVENS CAPSULE STUDY;  Surgeon: Willis Modena, MD;  Location: [facility] ENDOSCOPY;  Service: Endoscopy;  Laterality: N/A;  . Fracture surgery     Social History:   reports that he has quit smoking. His smoking use included Cigarettes. He smoked 1.00 pack per day. He does not  have any smokeless tobacco history on file. He reports that he does not drink alcohol or use illicit drugs.  Family History  Problem Relation Age of Onset  . Stroke Mother   . Heart attack Father   . Heart disease Father   . Cancer Other     FH of Breast Cancer-other Relative  . Heart disease Other     Parent  . Hypertension Other      parent  . Cancer Maternal Uncle     colon    Medications: Patient's Medications  New Prescriptions   No medications on file  Previous Medications   BISACODYL (DULCOLAX) 10 MG SUPPOSITORY    Place 10 mg rectally as needed for moderate constipation.   CARBAMIDE PEROXIDE (DEBROX) 6.5 % OTIC SOLUTION    Place 5 drops into both ears 2 (two) times daily. X 3 days starting 11/11/15   DIPHENHYDRAMINE (BENADRYL) 12.5 MG CHEWABLE TABLET    Chew 12.5 mg by mouth every 6 (six) hours as needed for allergies.   FUROSEMIDE (LASIX) 20 MG TABLET    Take 20 mg by mouth daily.    INSULIN ASPART (NOVOLOG) 100 UNIT/ML INJECTION    Inject 0-9 Units into the skin 3 (three) times daily with meals. Sliding scale CBG 70 - 120: 0 units CBG 121 - 150: 1 unit,  CBG 151 - 200: 2 units,  CBG 201 - 250: 3 units,  CBG 251 - 300: 5 units,  CBG 301 - 350: 7 units,  CBG 351 - 400: 9 units   CBG > 400: 9 units and notify your MD   INSULIN DETEMIR (LEVEMIR FLEXPEN) 100 UNIT/ML PEN    Inject 20 Units into the skin at bedtime.   LACTULOSE (CHRONULAC) 10 GM/15ML SOLUTION    Take 15 mLs (10 g total) by mouth 3 (three) times daily. Titrate for goal 3-4 BM's a day   LEVOTHYROXINE (SYNTHROID, LEVOTHROID) 25 MCG TABLET    Take 25 mcg by mouth daily.   MORPHINE SULFATE (MORPHINE CONCENTRATE) 10 MG / 0.5 ML CONCENTRATED SOLUTION    Take 5 mg by mouth every 6 (six) hours as needed for severe pain. And 2 mg by mouth ever 12 hours scheduled   OXYCODONE (OXY IR/ROXICODONE) 5 MG IMMEDIATE RELEASE TABLET    Take 1 tablet (5 mg total) by mouth every 6 (six) hours as needed for severe pain.   PHENYLEPHRINE-SHARK LIVER OIL-MINERAL OIL-PETROLATUM (PREPARATION H) 0.25-3-14-71.9 % RECTAL OINTMENT    Place 1 application rectally 2 (two) times daily as needed for hemorrhoids.   POLLEN EXTRACTS (PROSTAT PO)    Take 30 mLs by mouth daily.   POLYETHYLENE GLYCOL (MIRALAX / GLYCOLAX) PACKET    Take 17 g by mouth daily as needed for mild constipation.     PROMETHAZINE (PHENERGAN) 12.5 MG TABLET    Take 12.5 mg by mouth every 4 (four) hours as needed for nausea or vomiting.   SKIN PROTECTANTS, MISC. (EUCERIN) CREAM    Apply 1 application topically 2 (two) times daily.   SODIUM CHLORIDE (OCEAN) 0.65 % SOLN NASAL SPRAY    Place 1 spray into both nostrils 3 (three) times daily as needed (for nosebleed).   SPIRONOLACTONE (ALDACTONE) 50 MG TABLET    Take 50 mg by mouth 2 (two) times daily.   TAMSULOSIN (FLOMAX) 0.4 MG CAPS CAPSULE    Take 0.4 mg by mouth daily.   Modified Medications   No medications on file  Discontinued Medications   No medications  on file     Physical Exam: Filed Vitals:   12/05/15 1210  BP: 118/68  Pulse: 82  Temp: 97 F (36.1 C)  Resp: 20    Physical Exam  Constitutional: He appears well-developed. No distress.  HENT:  Head: Normocephalic and atraumatic. Head is without contusion and without laceration.  Mouth/Throat: No oropharyngeal exudate.  Eyes: Conjunctivae and EOM are normal. Pupils are equal, round, and reactive to light.  Neck: Normal range of motion. Neck supple.  Cardiovascular: Normal rate, regular rhythm and normal heart sounds.   Pulmonary/Chest: Effort normal and breath sounds normal.  Abdominal: Soft. Bowel sounds are normal. He exhibits no distension. There is no tenderness.  Musculoskeletal: He exhibits no edema or tenderness.  Neurological: He is alert. He has normal strength.  Skin: Skin is warm and dry. He is not diaphoretic.  excoriation to left scrotum with minimal amount of bleeding noted.   Psychiatric: He has a normal mood and affect.    Labs reviewed: Basic Metabolic Panel:  Recent Labs  16/09/9604/18/16 0920 05/01/15 0745 05/02/15 0359 06/02/15  NA 138 142 138 136*  K 4.0 3.6 3.2* 4.2  CL 108 112* 111  --   CO2 21* 21* 23  --   GLUCOSE 148* 106* 107*  --   BUN 22* 16 20 34*  CREATININE 0.97 0.95 0.89 1.4*  CALCIUM 8.0* 8.5* 8.0*  --    Liver Function Tests:  Recent Labs   04/30/15 0920 05/01/15 0745 05/02/15 0359  AST 57* 59* 51*  ALT 25 23 22   ALKPHOS 225* 226* 202*  BILITOT 1.2 1.5* 1.1  PROT 7.1 7.4 6.7  ALBUMIN 1.9* 1.9* 1.7*    Recent Labs  01/07/15 0545  LIPASE 26    Recent Labs  04/30/15 1018 05/01/15 0745 05/02/15 0359  AMMONIA 103* 28 53*   CBC:  Recent Labs  03/24/15 0640  03/26/15 0534  04/30/15 0920 05/01/15 0745 05/02/15 0359 06/02/15  WBC 2.3*  < > 4.0  < > 3.2* 3.4* 2.9* 1.9  NEUTROABS 1.8  --  2.9  --  2.5  --   --   --   HGB 7.3*  < > 8.3*  < > 8.9* 9.1* 8.3* 10.0*  HCT 23.2*  < > 25.2*  < > 27.5* 28.5* 25.7* 30*  MCV 92.1  < > 90.3  < > 87.3 88.2 87.1  --   PLT 107*  < > 84*  < > 120* 121* 112* 63*  < > = values in this interval not displayed. TSH:  Recent Labs  01/07/15 0545 04/30/15 1720 06/04/15  TSH 1.728 2.728 1.14   A1C: Lab Results  Component Value Date   HGBA1C 6.3* 06/04/2015   Lipid Panel: No results for input(s): CHOL, HDL, LDLCALC, TRIG, CHOLHDL, LDLDIRECT in the last 8760 hours.   Assessment/Plan 1. Uncontrolled type 2 diabetes mellitus with diabetic neuropathy, with long-term current use of insulin (HCC) -blood sugars stable, conts current medication, no low or high blood sugars noted  2. Alcoholic cirrhosis of liver without ascites (HCC) -pt has been receiving lactulose per order without missed dose. Bowels moving without changes.  -hospice nurse at bedside as well  3. Psychosis, unspecified psychosis type -will cont seroquel and fluoxitine. Recent increase in Lamictal which could be contributing to feeling poorly. Pts affect and LOC are at baseline during visit, pt does have tremor which is new. Will have staff monitor closely. Monitor VS q shift.   4. Excoriation to scrotum  Staff apply barrier cream BID and monitor   Discussed plan of care with hospice nurse and sister.  Janene Harvey. Biagio Borg  [facility] [phone] 8 am - 5  pm) [phone] (after hours)

## 2015-12-09 ENCOUNTER — Other Ambulatory Visit: Payer: Self-pay | Admitting: *Deleted

## 2015-12-09 MED ORDER — MORPHINE SULFATE (CONCENTRATE) 10 MG /0.5 ML PO SOLN: ORAL | Status: DC

## 2015-12-09 NOTE — Telephone Encounter (Signed)
[facility] 

## 2015-12-10 ENCOUNTER — Other Ambulatory Visit: Payer: Self-pay

## 2015-12-10 MED ORDER — MORPHINE SULFATE (CONCENTRATE) 10 MG /0.5 ML PO SOLN: ORAL | Status: DC

## 2015-12-10 NOTE — Telephone Encounter (Signed)
Faxed to [facility] Fax Number: [phone], Phone Number [phone]  

## 2015-12-11 ENCOUNTER — Other Ambulatory Visit: Payer: Self-pay | Admitting: *Deleted

## 2015-12-11 MED ORDER — MORPHINE SULFATE (CONCENTRATE) 20 MG/ML PO SOLN: ORAL | Status: DC

## 2015-12-11 NOTE — Telephone Encounter (Signed)
[facility] 

## 2015-12-26 ENCOUNTER — Encounter: Payer: Self-pay | Admitting: Nurse Practitioner

## 2015-12-26 ENCOUNTER — Non-Acute Institutional Stay (SKILLED_NURSING_FACILITY): Payer: Medicare Other | Admitting: Nurse Practitioner

## 2015-12-26 DIAGNOSIS — IMO0002 Reserved for concepts with insufficient information to code with codable children: Secondary | ICD-10-CM

## 2015-12-26 DIAGNOSIS — K118 Other diseases of salivary glands: Secondary | ICD-10-CM | POA: Diagnosis not present

## 2015-12-26 DIAGNOSIS — E1165 Type 2 diabetes mellitus with hyperglycemia: Secondary | ICD-10-CM

## 2015-12-26 DIAGNOSIS — F29 Unspecified psychosis not due to a substance or known physiological condition: Secondary | ICD-10-CM

## 2015-12-26 DIAGNOSIS — E1140 Type 2 diabetes mellitus with diabetic neuropathy, unspecified: Secondary | ICD-10-CM

## 2015-12-26 DIAGNOSIS — Z794 Long term (current) use of insulin: Secondary | ICD-10-CM | POA: Diagnosis not present

## 2015-12-26 DIAGNOSIS — R600 Localized edema: Secondary | ICD-10-CM | POA: Diagnosis not present

## 2015-12-26 DIAGNOSIS — K7030 Alcoholic cirrhosis of liver without ascites: Secondary | ICD-10-CM | POA: Diagnosis not present

## 2015-12-26 NOTE — Progress Notes (Signed)
Nursing Home Location:  [facility]   Place of Service: SNF (31)  PCP: Margit Hanks, MD  Allergies  Allergen Reactions  . Penicillins Hives    Chief Complaint  Patient presents with  . Medical Management of Chronic Issues    HPI:  Patient is a 71 y.o. male seen today at [facility] for routine follow up on chronic conditions. Pt with a pmh of history of hypertension,diabetes, hypothyroidism, CKD, bilateral hip fractures s/p repair and depression. Pt being followed by hospice due to end stage cirrhosis and hepatic encephalopathy. In the last month pt was seen by psych due to behaviors and then had increased lethargy and decline. Medication were stopped and pt was transitioned to comfort measures only.  However since then pt has become more alert and mental status and activities returned to baseline. Pt eating and drinking better and medication has been resumed. 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 Pt tolerating medication without side effects. Appetite conts to improve. Eating without difficulty.   Review of Systems:  Review of Systems  Constitutional: Positive for fatigue (improved). Negative for activity change, appetite change and unexpected weight change.  HENT: Negative for congestion, hearing loss and sore throat.   Eyes: Negative.   Respiratory: Negative for cough and shortness of breath.   Cardiovascular: Negative for chest pain, palpitations and leg swelling.  Gastrointestinal: Negative for abdominal pain, diarrhea and constipation.  Genitourinary: Negative for urgency and difficulty urinating.  Musculoskeletal: Negative for myalgias and arthralgias.  Skin: Negative for color change, pallor, rash and wound.  Neurological: Positive for weakness. Negative for dizziness, tremors, light-headedness and headaches.  Psychiatric/Behavioral: Positive for  behavioral problems, confusion and agitation. The patient is not nervous/anxious.        Behaviors have been stable    Past Medical History  Diagnosis Date  . Hyperglycemia   . Hyponatremia   . DMII (diabetes mellitus, type 2) (HCC)   . Depression   . IBS (irritable bowel syndrome)   . Chest pain   . Unspecified gastritis and gastroduodenitis without mention of hemorrhage   . Arthropathy, unspecified, site unspecified   . History of colon polyps   . Anemia, unspecified   . Anxiety   . GERD (gastroesophageal reflux disease)   . Hyperlipidemia   . Hypertension   . Liver problem    Past Surgical History  Procedure Laterality Date  . Hernia repair  1964  . Intramedullary (im) nail intertrochanteric Left 01/07/2015    Procedure: INTRAMEDULLARY (IM) NAIL INTERTROCHANTRIC;  Surgeon: Kathryne Hitch, MD;  Location: [facility];  Service: Orthopedics;  Laterality: Left;  . Femur im nail Right 03/24/2015    Procedure: CLOSED REDUCTION INTERNAL FIXATION;  Surgeon: Kerrin Champagne, MD;  Location: [facility] OR;  Service: Orthopedics;  Laterality: Right;  . Esophagogastroduodenoscopy N/A 04/21/2015    Procedure: ESOPHAGOGASTRODUODENOSCOPY (EGD);  Surgeon: Willis Modena, MD;  Location: [facility] ENDOSCOPY;  Service: Endoscopy;  Laterality: N/A;  . Givens capsule study N/A 04/21/2015    Procedure: GIVENS CAPSULE STUDY;  Surgeon: Willis Modena, MD;  Location: [facility] ENDOSCOPY;  Service: Endoscopy;  Laterality: N/A;  . Fracture surgery     Social History:   reports that he has quit smoking. His smoking use included Cigarettes. He smoked 1.00 pack per day. He does not have any smokeless tobacco history on file. He reports that he  does not drink alcohol or use illicit drugs.  Family History  Problem Relation Age of Onset  . Stroke Mother   . Heart attack Father   . Heart disease Father   . Cancer Other     FH of Breast Cancer-other Relative  . Heart disease Other     Parent  . Hypertension Other     parent  .  Cancer Maternal Uncle     colon    Medications: Patient's Medications  New Prescriptions   No medications on file  Previous Medications   BISACODYL (DULCOLAX) 10 MG SUPPOSITORY    Place 10 mg rectally as needed for moderate constipation.   CARBAMIDE PEROXIDE (DEBROX) 6.5 % OTIC SOLUTION    Place 5 drops into both ears 2 (two) times daily. X 3 days starting 11/11/15   CLINDAMYCIN HCL PO    Take 300 mg by mouth every 8 (eight) hours.   DIPHENHYDRAMINE (BENADRYL) 12.5 MG CHEWABLE TABLET    Chew 12.5 mg by mouth every 6 (six) hours as needed for allergies.   HYPROMELLOSE (ARTIFICIAL TEARS OP)    Apply to eye. Two drops ou tid for dry eyes.   INSULIN ASPART (NOVOLOG) 100 UNIT/ML INJECTION    Inject 0-9 Units into the skin 3 (three) times daily with meals. Sliding scale CBG 70 - 120: 0 units CBG 121 - 150: 1 unit,  CBG 151 - 200: 2 units,  CBG 201 - 250: 3 units,  CBG 251 - 300: 5 units,  CBG 301 - 350: 7 units,  CBG 351 - 400: 9 units   CBG > 400: 9 units and notify your MD   INSULIN DETEMIR (LEVEMIR FLEXPEN) 100 UNIT/ML PEN    Inject 24 Units into the skin at bedtime.    LACTULOSE (CHRONULAC) 10 GM/15ML SOLUTION    Take 30 g by mouth 3 (three) times daily. Titrate for goal 3-4 BM's a day   LEVOTHYROXINE (SYNTHROID, LEVOTHROID) 25 MCG TABLET    Take 25 mcg by mouth daily.   MORPHINE (ROXANOL) 20 MG/ML CONCENTRATED SOLUTION    Give 0.59ml by mouth every 4 hours as needed for pain   MORPHINE SULFATE (MORPHINE CONCENTRATE) 10 MG / 0.5 ML CONCENTRATED SOLUTION    Take 1ml by mouth every 12 hours for pain   PHENYLEPHRINE-SHARK LIVER OIL-MINERAL OIL-PETROLATUM (PREPARATION H) 0.25-3-14-71.9 % RECTAL OINTMENT    Place 1 application rectally 2 (two) times daily as needed for hemorrhoids.   POLYETHYLENE GLYCOL (MIRALAX / GLYCOLAX) PACKET    Take 17 g by mouth daily as needed for mild constipation.    PROMETHAZINE (PHENERGAN) 12.5 MG TABLET    Take 25 mg by mouth every 4 (four) hours as needed for nausea or  vomiting.    SKIN PROTECTANTS, MISC. (EUCERIN) CREAM    Apply 1 application topically 2 (two) times daily.   SODIUM CHLORIDE (OCEAN) 0.65 % SOLN NASAL SPRAY    Place 1 spray into both nostrils 3 (three) times daily as needed (for nosebleed).   SPIRONOLACTONE (ALDACTONE) 50 MG TABLET    Take 50 mg by mouth 2 (two) times daily.   TAMSULOSIN (FLOMAX) 0.4 MG CAPS CAPSULE    Take 0.4 mg by mouth 3 (three) times daily as needed.   Modified Medications   No medications on file  Discontinued Medications   FUROSEMIDE (LASIX) 20 MG TABLET    Take 20 mg by mouth daily. Reported on 12/26/2015   OXYCODONE (OXY IR/ROXICODONE) 5 MG IMMEDIATE RELEASE TABLET  Take 1 tablet (5 mg total) by mouth every 6 (six) hours as needed for severe pain.   POLLEN EXTRACTS (PROSTAT PO)    Take 30 mLs by mouth daily.     Physical Exam: Filed Vitals:   12/26/15 1048  BP: 122/64  Pulse: 82  Temp: 99.3 F (37.4 C)  TempSrc: Oral  Resp: 19  Weight: 133 lb 6.4 oz (60.51 kg)    Physical Exam  Constitutional: No distress.  Frail thin male NAD  HENT:  Head: Normocephalic and atraumatic. Head is without contusion and without laceration.  Mouth/Throat: No oropharyngeal exudate.  Eyes: Conjunctivae and EOM are normal. Pupils are equal, round, and reactive to light.  Neck: Normal range of motion. Neck supple.  Cardiovascular: Normal rate, regular rhythm and normal heart sounds.   Pulmonary/Chest: Effort normal and breath sounds normal.  Abdominal: Soft. Bowel sounds are normal. He exhibits no distension. There is no tenderness.  Musculoskeletal: He exhibits no edema or tenderness.  Neurological: He is alert. He has normal strength.  Skin: Skin is warm and dry. He is not diaphoretic. There is pallor.  Psychiatric: He has a normal mood and affect.    Labs reviewed: Basic Metabolic Panel:  Recent Labs  40/98/11 0920 05/01/15 0745 05/02/15 0359 06/02/15  NA 138 142 138 136*  K 4.0 3.6 3.2* 4.2  CL 108 112* 111   --   CO2 21* 21* 23  --   GLUCOSE 148* 106* 107*  --   BUN 22* 16 20 34*  CREATININE 0.97 0.95 0.89 1.4*  CALCIUM 8.0* 8.5* 8.0*  --    Liver Function Tests:  Recent Labs  04/30/15 0920 05/01/15 0745 05/02/15 0359  AST 57* 59* 51*  ALT 25 23 22   ALKPHOS 225* 226* 202*  BILITOT 1.2 1.5* 1.1  PROT 7.1 7.4 6.7  ALBUMIN 1.9* 1.9* 1.7*    Recent Labs  01/07/15 0545  LIPASE 26    Recent Labs  04/30/15 1018 05/01/15 0745 05/02/15 0359  AMMONIA 103* 28 53*   CBC:  Recent Labs  03/24/15 0640  03/26/15 0534  04/30/15 0920 05/01/15 0745 05/02/15 0359 06/02/15  WBC 2.3*  < > 4.0  < > 3.2* 3.4* 2.9* 1.9  NEUTROABS 1.8  --  2.9  --  2.5  --   --   --   HGB 7.3*  < > 8.3*  < > 8.9* 9.1* 8.3* 10.0*  HCT 23.2*  < > 25.2*  < > 27.5* 28.5* 25.7* 30*  MCV 92.1  < > 90.3  < > 87.3 88.2 87.1  --   PLT 107*  < > 84*  < > 120* 121* 112* 63*  < > = values in this interval not displayed. TSH:  Recent Labs  01/07/15 0545 04/30/15 1720 06/04/15  TSH 1.728 2.728 1.14   A1C: Lab Results  Component Value Date   HGBA1C 6.3* 06/04/2015   Lipid Panel: No results for input(s): CHOL, HDL, LDLCALC, TRIG, CHOLHDL, LDLDIRECT in the last 8760 hours.   Assessment/Plan  1. Psychosis, unspecified psychosis type Mood and behaviors have been stable.   2. Uncontrolled type 2 diabetes mellitus with diabetic neuropathy, with long-term current use of insulin (HCC) levemir and SSI has been restarted, blood sugars in appropiate range at this time. Will cont to monitor   3. Alcoholic cirrhosis of liver without ascites (HCC) -improved mentation, conts on lactulose   4. Salivary duct obstruction -dx by dentist, improving,  conts on clindamycin for  1 month  5. Edema of both legs -remains stable, cont on aldactone.   Janene HarveyJessica K. Biagio BorgEubanks, AGNP  [facility] [phone](Monday-Friday 8 am - 5 pm) [phone] (after hours)

## 2016-01-23 ENCOUNTER — Non-Acute Institutional Stay (SKILLED_NURSING_FACILITY): Payer: Medicare Other | Admitting: Nurse Practitioner

## 2016-01-23 DIAGNOSIS — R600 Localized edema: Secondary | ICD-10-CM | POA: Diagnosis not present

## 2016-01-23 DIAGNOSIS — F29 Unspecified psychosis not due to a substance or known physiological condition: Secondary | ICD-10-CM | POA: Diagnosis not present

## 2016-01-23 DIAGNOSIS — E1165 Type 2 diabetes mellitus with hyperglycemia: Secondary | ICD-10-CM | POA: Diagnosis not present

## 2016-01-23 DIAGNOSIS — K7030 Alcoholic cirrhosis of liver without ascites: Secondary | ICD-10-CM | POA: Diagnosis not present

## 2016-01-23 DIAGNOSIS — E1140 Type 2 diabetes mellitus with diabetic neuropathy, unspecified: Secondary | ICD-10-CM

## 2016-01-23 DIAGNOSIS — N400 Enlarged prostate without lower urinary tract symptoms: Secondary | ICD-10-CM

## 2016-01-23 DIAGNOSIS — IMO0002 Reserved for concepts with insufficient information to code with codable children: Secondary | ICD-10-CM

## 2016-01-23 DIAGNOSIS — K649 Unspecified hemorrhoids: Secondary | ICD-10-CM

## 2016-01-23 DIAGNOSIS — Z794 Long term (current) use of insulin: Secondary | ICD-10-CM | POA: Diagnosis not present

## 2016-01-23 NOTE — Progress Notes (Signed)
Patient ID: [name], male   DOB: [birth year], 70 y.o.   MRN: [id number]    Nursing Home Location:  [facility]   Place of Service: SNF (31)  PCP: Margit Hanks, MD  Allergies  Allergen Reactions  . Penicillins Hives    Chief Complaint  Patient presents with  . Medical Management of Chronic Issues    HPI:  Patient is a 70 y.o. male seen today at [facility] for routine follow up. Pt with a pmh of history of hypertension,diabetes, hypothyroidism, CKD, bilateral hip fractures s/p repair and depression. Pt being followed by hospice due to end stage cirrhosis and hepatic encephalopathy. Pt has been doing well in the last month, some increase in behaviors which he is followed by psych for. Recent titration of Lamictal and Seroquel to help with PTSD/psychosis. Pt reports swelling in legs has improved recently. Blood sugars have been elevated due to increase PO intake.   Review of Systems:  Review of Systems  Constitutional: Positive for fatigue. Negative for activity change, appetite change and unexpected weight change.  HENT: Negative for congestion, hearing loss and sore throat.   Eyes: Negative.   Respiratory: Negative for cough and shortness of breath.   Cardiovascular: Negative for chest pain, palpitations and leg swelling.  Gastrointestinal: Negative for abdominal pain, diarrhea and constipation.  Genitourinary: Negative for urgency and difficulty urinating.  Musculoskeletal: Negative for myalgias and arthralgias.  Skin: Positive for wound. Negative for color change, pallor and rash.  Neurological: Positive for tremors and weakness. Negative for dizziness, light-headedness and headaches.  Psychiatric/Behavioral: Positive for behavioral problems, confusion, sleep disturbance and agitation. The patient is not nervous/anxious.     Past Medical History  Diagnosis Date  . Hyperglycemia   . Hyponatremia   . DMII (diabetes mellitus, type 2)  (HCC)   . Depression   . IBS (irritable bowel syndrome)   . Chest pain   . Unspecified gastritis and gastroduodenitis without mention of hemorrhage   . Arthropathy, unspecified, site unspecified   . History of colon polyps   . Anemia, unspecified   . Anxiety   . GERD (gastroesophageal reflux disease)   . Hyperlipidemia   . Hypertension   . Liver problem    Past Surgical History  Procedure Laterality Date  . Hernia repair  1964  . Intramedullary (im) nail intertrochanteric Left 01/07/2015    Procedure: INTRAMEDULLARY (IM) NAIL INTERTROCHANTRIC;  Surgeon: Kathryne Hitch, MD;  Location: [facility];  Service: Orthopedics;  Laterality: Left;  . Femur im nail Right 03/24/2015    Procedure: CLOSED REDUCTION INTERNAL FIXATION;  Surgeon: Kerrin Champagne, MD;  Location: [facility] OR;  Service: Orthopedics;  Laterality: Right;  . Esophagogastroduodenoscopy N/A 04/21/2015    Procedure: ESOPHAGOGASTRODUODENOSCOPY (EGD);  Surgeon: Willis Modena, MD;  Location: [facility] ENDOSCOPY;  Service: Endoscopy;  Laterality: N/A;  . Givens capsule study N/A 04/21/2015    Procedure: GIVENS CAPSULE STUDY;  Surgeon: Willis Modena, MD;  Location: [facility] ENDOSCOPY;  Service: Endoscopy;  Laterality: N/A;  . Fracture surgery     Social History:   reports that he has quit smoking. His smoking use included Cigarettes. He smoked 1.00 pack per day. He does not have any smokeless tobacco history on file. He reports that he does not drink alcohol or use illicit drugs.  Family History  Problem Relation Age of Onset  . Stroke Mother   . Heart attack Father   . Heart disease Father   . Cancer  Other     FH of Breast Cancer-other Relative  . Heart disease Other     Parent  . Hypertension Other     parent  . Cancer Maternal Uncle     colon    Medications: Patient's Medications  New Prescriptions   No medications on file  Previous Medications   BISACODYL (DULCOLAX) 10 MG SUPPOSITORY    Place 10 mg rectally as needed for moderate  constipation.   CARBAMIDE PEROXIDE (DEBROX) 6.5 % OTIC SOLUTION    Place 5 drops into both ears 2 (two) times daily. X 3 days starting 11/11/15   DIPHENHYDRAMINE (BENADRYL) 12.5 MG CHEWABLE TABLET    Chew 12.5 mg by mouth every 6 (six) hours as needed for allergies.   FUROSEMIDE (LASIX) 20 MG TABLET    Take 20 mg by mouth.   HYPROMELLOSE (ARTIFICIAL TEARS OP)    Apply to eye. Two drops ou tid for dry eyes.   INSULIN ASPART (NOVOLOG) 100 UNIT/ML INJECTION    Inject 0-9 Units into the skin 3 (three) times daily with meals. Sliding scale CBG 70 - 120: 0 units CBG 121 - 150: 1 unit,  CBG 151 - 200: 2 units,  CBG 201 - 250: 3 units,  CBG 251 - 300: 5 units,  CBG 301 - 350: 7 units,  CBG 351 - 400: 9 units   CBG > 400: 9 units and notify your MD   INSULIN DETEMIR (LEVEMIR FLEXPEN) 100 UNIT/ML PEN    Inject 24 Units into the skin at bedtime.    LACTULOSE (CHRONULAC) 10 GM/15ML SOLUTION    Take 30 g by mouth 3 (three) times daily. Titrate for goal 3-4 BM's a day   LAMOTRIGINE (LAMICTAL) 100 MG TABLET    Take 100 mg by mouth daily.   LEVOTHYROXINE (SYNTHROID, LEVOTHROID) 25 MCG TABLET    Take 25 mcg by mouth daily.   MORPHINE (ROXANOL) 20 MG/ML CONCENTRATED SOLUTION    Give 0.82ml by mouth every 4 hours as needed for pain   MORPHINE SULFATE (MORPHINE CONCENTRATE) 10 MG / 0.5 ML CONCENTRATED SOLUTION    Take 1ml by mouth every 12 hours for pain   OXYCODONE (OXY IR/ROXICODONE) 5 MG IMMEDIATE RELEASE TABLET    Take 5 mg by mouth every 6 (six) hours as needed for severe pain.   PHENYLEPHRINE-SHARK LIVER OIL-MINERAL OIL-PETROLATUM (PREPARATION H) 0.25-3-14-71.9 % RECTAL OINTMENT    Place 1 application rectally 2 (two) times daily as needed for hemorrhoids.   POLYETHYLENE GLYCOL (MIRALAX / GLYCOLAX) PACKET    Take 17 g by mouth daily as needed for mild constipation.    PROMETHAZINE (PHENERGAN) 12.5 MG TABLET    Take 25 mg by mouth every 4 (four) hours as needed for nausea or vomiting.    QUETIAPINE (SEROQUEL) 50 MG  TABLET    Take 50 mg by mouth at bedtime.   SKIN PROTECTANTS, MISC. (EUCERIN) CREAM    Apply 1 application topically 2 (two) times daily.   SODIUM CHLORIDE (OCEAN) 0.65 % SOLN NASAL SPRAY    Place 1 spray into both nostrils 3 (three) times daily as needed (for nosebleed).   SPIRONOLACTONE (ALDACTONE) 50 MG TABLET    Take 50 mg by mouth 2 (two) times daily.   TAMSULOSIN (FLOMAX) 0.4 MG CAPS CAPSULE    Take 0.4 mg by mouth 3 (three) times daily as needed.   Modified Medications   No medications on file  Discontinued Medications   No medications on file  Physical Exam: Filed Vitals:   01/23/16 1409  BP: 122/64  Pulse: 89  Temp: 97.8 F (36.6 C)  Resp: 20  Weight: 144 lb 12.8 oz (65.681 kg)    Physical Exam  Constitutional: He appears well-developed. No distress.  HENT:  Head: Normocephalic and atraumatic. Head is without contusion and without laceration.  Mouth/Throat: No oropharyngeal exudate.  Eyes: Conjunctivae and EOM are normal. Pupils are equal, round, and reactive to light.  Neck: Normal range of motion. Neck supple.  Cardiovascular: Normal rate, regular rhythm and normal heart sounds.   Pulmonary/Chest: Effort normal and breath sounds normal.  Abdominal: Soft. Bowel sounds are normal. He exhibits no distension. There is no tenderness.  Musculoskeletal: He exhibits no edema or tenderness.  Neurological: He is alert. He has normal strength.  Skin: Skin is warm and dry. He is not diaphoretic.  unstageable pressure ulcer to left heel and right great toe.   Psychiatric: He has a normal mood and affect.    Labs reviewed: Basic Metabolic Panel:  Recent Labs  16/10/96 0920 05/01/15 0745 05/02/15 0359 06/02/15  NA 138 142 138 136*  K 4.0 3.6 3.2* 4.2  CL 108 112* 111  --   CO2 21* 21* 23  --   GLUCOSE 148* 106* 107*  --   BUN 22* 16 20 34*  CREATININE 0.97 0.95 0.89 1.4*  CALCIUM 8.0* 8.5* 8.0*  --    Liver Function Tests:  Recent Labs  04/30/15 0920  05/01/15 0745 05/02/15 0359  AST 57* 59* 51*  ALT ALKPHOS 225* 226* 202*  BILITOT 1.2 1.5* 1.1  PROT 7.1 7.4 6.7  ALBUMIN 1.9* 1.9* 1.7*   No results for input(s): LIPASE, AMYLASE in the last 8760 hours.  Recent Labs  04/30/15 1018 05/01/15 0745 05/02/15 0359  AMMONIA 103* 28 53*   CBC:  Recent Labs  03/24/15 0640  03/26/15 0534  04/30/15 0920 05/01/15 0745 05/02/15 0359 06/02/15  WBC 2.3*  < > 4.0  < > 3.2* 3.4* 2.9* 1.9  NEUTROABS 1.8  --  2.9  --  2.5  --   --   --   HGB 7.3*  < > 8.3*  < > 8.9* 9.1* 8.3* 10.0*  HCT 23.2*  < > 25.2*  < > 27.5* 28.5* 25.7* 30*  MCV 92.1  < > 90.3  < > 87.3 88.2 87.1  --   PLT 107*  < > 84*  < > 120* 121* 112* 63*  < > = values in this interval not displayed. TSH:  Recent Labs  04/30/15 1720 06/04/15  TSH 2.728 1.14   A1C: Lab Results  Component Value Date   HGBA1C 6.3* 06/04/2015   Lipid Panel: No results for input(s): CHOL, HDL, LDLCALC, TRIG, CHOLHDL, LDLDIRECT in the last 8760 hours.   Assessment/Plan 1. Uncontrolled type 2 diabetes mellitus with diabetic neuropathy, with long-term current use of insulin (HCC) -blood sugars elevated, will increase levemir to 26 units q hs and cont SSI  2. Alcoholic cirrhosis of liver without ascites (HCC) Mental status remains stable, reports ~5 BMs a day. Does not take lactulose routinely. Will cont current regimen.   3. Psychosis, unspecified psychosis type -ongoing follow up by psych. Active titration of Lamictal and Seroquel. Will cont at this time.   4. Localized edema Worsening edema noted last week that has now improved, conts on lasix and aldactone. To keep LE elevated  5. BPH (benign prostatic hyperplasia) -remains stable, conts on  flomax   6. Hemorrhoids  -may use prep H supp PR q 6 hour PRN   Celicia Minahan K. Biagio Borg  [facility] [phone] 8 am - 5 pm) [phone] (after hours)

## 2016-02-05 ENCOUNTER — Encounter: Payer: Self-pay | Admitting: Internal Medicine

## 2016-02-05 ENCOUNTER — Non-Acute Institutional Stay (SKILLED_NURSING_FACILITY): Payer: Medicare Other | Admitting: Internal Medicine

## 2016-02-05 DIAGNOSIS — R600 Localized edema: Secondary | ICD-10-CM | POA: Diagnosis not present

## 2016-02-05 DIAGNOSIS — K7030 Alcoholic cirrhosis of liver without ascites: Secondary | ICD-10-CM

## 2016-02-05 DIAGNOSIS — I10 Essential (primary) hypertension: Secondary | ICD-10-CM | POA: Diagnosis not present

## 2016-02-05 NOTE — Progress Notes (Signed)
Patient ID: [name], male   DOB: 1946-08-09, 70 y.o.   MRN: [id number]    DATE: 02/05/16  Location:  [facility]    Place of Service: SNF (31)   Extended Emergency Contact Information Primary Emergency Contact: [name],[name] Address: [address]          [place], [place] Work Phone: [phone] Mobile Phone: [phone] Relation: Sister  Advanced Directive information  DNR; [facility]; Hospice pt  Chief Complaint  Patient presents with  . Acute Visit    leg swelling    HPI:  70 yo male long term resident seen today for LE edema. Swelling present x several weeks. He is currently taking lasix 20mg  daily and spironolactone 50mg  BID for Etoh induced cirrhosis. He keeps legs elevated when seated or lying down. He is mostly sedentary. He reports pain in legs. He has SOB which is not new. No CP. No f/c. He is a poor historian due to hepatic encephalopathy. He takes lactulose. CBG 358 today. DM is insulin dependent. Pain controlled with roxanol and prn roxicodone  Past Medical History  Diagnosis Date  . Hyperglycemia   . Hyponatremia   . DMII (diabetes mellitus, type 2) (HCC)   . Depression   . IBS (irritable bowel syndrome)   . Chest pain   . Unspecified gastritis and gastroduodenitis without mention of hemorrhage   . Arthropathy, unspecified, site unspecified   . History of colon polyps   . Anemia, unspecified   . Anxiety   . GERD (gastroesophageal reflux disease)   . Hyperlipidemia   . Hypertension   . Liver problem     Past Surgical History  Procedure Laterality Date  . Hernia repair  1964  . Intramedullary (im) nail intertrochanteric Left 01/07/2015    Procedure: INTRAMEDULLARY (IM) NAIL INTERTROCHANTRIC;  Surgeon: Kathryne Hitch, MD;  Location: [facility];  Service: Orthopedics;  Laterality: Left;  . Femur im nail Right 03/24/2015    Procedure: CLOSED REDUCTION INTERNAL FIXATION;  Surgeon: Kerrin Champagne, MD;   Location: [facility] OR;  Service: Orthopedics;  Laterality: Right;  . Esophagogastroduodenoscopy N/A 04/21/2015    Procedure: ESOPHAGOGASTRODUODENOSCOPY (EGD);  Surgeon: Willis Modena, MD;  Location: [facility] ENDOSCOPY;  Service: Endoscopy;  Laterality: N/A;  . Givens capsule study N/A 04/21/2015    Procedure: GIVENS CAPSULE STUDY;  Surgeon: Willis Modena, MD;  Location: [facility] ENDOSCOPY;  Service: Endoscopy;  Laterality: N/A;  . Fracture surgery      Patient Care Team: Margit Hanks, MD as PCP - General (Internal Medicine)  Social History   Social History  . Marital Status: Divorced    Spouse Name: N/A  . Number of Children: N/A  . Years of Education: N/A   Occupational History  . Not on file.   Social History Main Topics  . Smoking status: Former Smoker -- 1.00 packs/day    Types: Cigarettes  . Smokeless tobacco: Not on file  . Alcohol Use: No  . Drug Use: No  . Sexual Activity: Yes   Other Topics Concern  . Not on file   Social History Narrative     reports that he has quit smoking. His smoking use included Cigarettes. He smoked 1.00 pack per day. He does not have any smokeless tobacco history on file. He reports that he does not drink alcohol or use illicit drugs.  Immunization History  Administered Date(s) Administered  . Influenza-Unspecified 09/13/2011, 09/14/2015  . PPD Test 01/07/2014, 03/27/2015  .  Pneumococcal Polysaccharide-23 04/22/2015  . Pneumococcal-Unspecified 12/13/2009  . Tdap 12/13/2009    Allergies  Allergen Reactions  . Penicillins Hives    Medications: Patient's Medications  New Prescriptions   No medications on file  Previous Medications   BISACODYL (DULCOLAX) 10 MG SUPPOSITORY    Place 10 mg rectally as needed for moderate constipation.   CARBAMIDE PEROXIDE (DEBROX) 6.5 % OTIC SOLUTION    Place 5 drops into both ears 2 (two) times daily. X 3 days starting 11/11/15   DIPHENHYDRAMINE (BENADRYL) 12.5 MG CHEWABLE TABLET    Chew 12.5 mg by mouth every 6  (six) hours as needed for allergies.   FUROSEMIDE (LASIX) 20 MG TABLET    Take 20 mg by mouth.   HYPROMELLOSE (ARTIFICIAL TEARS OP)    Apply to eye. Two drops ou tid for dry eyes.   INSULIN ASPART (NOVOLOG) 100 UNIT/ML INJECTION    Inject 0-9 Units into the skin 3 (three) times daily with meals. Sliding scale CBG 70 - 120: 0 units CBG 121 - 150: 1 unit,  CBG 151 - 200: 2 units,  CBG 201 - 250: 3 units,  CBG 251 - 300: 5 units,  CBG 301 - 350: 7 units,  CBG 351 - 400: 9 units   CBG > 400: 9 units and notify your MD   INSULIN DETEMIR (LEVEMIR FLEXPEN) 100 UNIT/ML PEN    Inject 24 Units into the skin at bedtime.    LACTULOSE (CHRONULAC) 10 GM/15ML SOLUTION    Take 30 g by mouth 3 (three) times daily. Titrate for goal 3-4 BM's a day   LAMOTRIGINE (LAMICTAL) 100 MG TABLET    Take 100 mg by mouth daily.   LEVOTHYROXINE (SYNTHROID, LEVOTHROID) 25 MCG TABLET    Take 25 mcg by mouth daily.   MORPHINE (ROXANOL) 20 MG/ML CONCENTRATED SOLUTION    Give 0.37ml by mouth every 4 hours as needed for pain   MORPHINE SULFATE (MORPHINE CONCENTRATE) 10 MG / 0.5 ML CONCENTRATED SOLUTION    Take 1ml by mouth every 12 hours for pain   OXYCODONE (OXY IR/ROXICODONE) 5 MG IMMEDIATE RELEASE TABLET    Take 5 mg by mouth every 6 (six) hours as needed for severe pain.   PHENYLEPHRINE-SHARK LIVER OIL-MINERAL OIL-PETROLATUM (PREPARATION H) 0.25-3-14-71.9 % RECTAL OINTMENT    Place 1 application rectally 2 (two) times daily as needed for hemorrhoids.   POLYETHYLENE GLYCOL (MIRALAX / GLYCOLAX) PACKET    Take 17 g by mouth daily as needed for mild constipation.    PROMETHAZINE (PHENERGAN) 12.5 MG TABLET    Take 25 mg by mouth every 4 (four) hours as needed for nausea or vomiting.    QUETIAPINE (SEROQUEL) 50 MG TABLET    Take 50 mg by mouth at bedtime.   SKIN PROTECTANTS, MISC. (EUCERIN) CREAM    Apply 1 application topically 2 (two) times daily.   SODIUM CHLORIDE (OCEAN) 0.65 % SOLN NASAL SPRAY    Place 1 spray into both nostrils 3  (three) times daily as needed (for nosebleed).   SPIRONOLACTONE (ALDACTONE) 50 MG TABLET    Take 50 mg by mouth 2 (two) times daily.   TAMSULOSIN (FLOMAX) 0.4 MG CAPS CAPSULE    Take 0.4 mg by mouth 3 (three) times daily as needed.   Modified Medications   No medications on file  Discontinued Medications   No medications on file    Review of Systems  Unable to perform ROS: Other    Filed Vitals:  02/05/16 1619  BP: 156/67  Pulse: 74  Temp: 98.3 F (36.8 C)   There is no weight on file to calculate BMI.  Physical Exam  Constitutional:  Sitting in w/c in NAD. No conversational dyspnea. Frail appearing  Cardiovascular:  R>L +1 pitting LE edema. DP/PT pulse +2 b/l. (+) calf TTP but neg Homan's sign. No palpable cord b/l.   Musculoskeletal: He exhibits edema and tenderness.  Neurological: He is alert.  Skin: Skin is warm and dry. No rash noted.  Psychiatric: He has a normal mood and affect. His behavior is normal.     Labs reviewed: No visits with results within 3 Month(s) from this visit. Latest known visit with results is:  Nursing Home on 08/01/2015  Component Date Value Ref Range Status  . Hemoglobin 06/02/2015 10.0* 13.5 - 17.5 g/dL Final  . HCT 16/09/9603 30* 41 - 53 % Final  . Platelets 06/02/2015 63* 150 - 399 K/L Final  . WBC 06/02/2015 1.9   Final  . Glucose 06/02/2015 213   Final  . BUN 06/02/2015 34* 4 - 21 mg/dL Final  . Creatinine 54/08/8118 1.4* 0.6 - 1.3 mg/dL Final  . Potassium 14/78/2956 4.2  3.4 - 5.3 mmol/L Final  . Sodium 06/02/2015 136* 137 - 147 mmol/L Final  . Hgb A1c MFr Bld 06/04/2015 6.3* 4.0 - 6.0 % Final  . TSH 06/04/2015 1.14  .41 - 5.90 uIU/mL Final    No results found.   Assessment/Plan   ICD-9-CM ICD-10-CM   1. Edema of both legs - failing to change as expected 782.3 R60.0   2. Essential hypertension - BP elevated today most likely related to pain and edema 401.9 I10   3. Alcoholic cirrhosis of liver without ascites (HCC) 571.2  K70.30     Check venous doppler US b/l leg to r/o DVT. If (+) pt would like to be treated. Will likely consider pradaxa vs injectable  lasix  po x 1 now. t/c increasing daily dose to   Continue daily lasix and BID spironolactone  Check BMP today  Cont other meds as ordered  Keep legs elevated while seated or lying down  Will follow  Brynne Doane S. Ancil Linsey  [facility] [address] [place], [place] [address] [phone] Cell (Monday-Friday 8 AM - 5 PM) [phone] After 5 PM and follow prompts

## 2016-02-07 LAB — BASIC METABOLIC PANEL
BUN: 25 mg/dL — AB (ref 4–21)
Creatinine: 1.2 mg/dL (ref 0.6–1.3)
Glucose: 304 mg/dL
POTASSIUM: 4.8 mmol/L (ref 3.4–5.3)
Sodium: 131 mmol/L — AB (ref 137–147)

## 2016-02-27 ENCOUNTER — Non-Acute Institutional Stay (SKILLED_NURSING_FACILITY): Payer: Medicare Other | Admitting: Adult Health

## 2016-02-27 ENCOUNTER — Encounter: Payer: Self-pay | Admitting: Adult Health

## 2016-02-27 DIAGNOSIS — G9340 Encephalopathy, unspecified: Secondary | ICD-10-CM

## 2016-02-27 DIAGNOSIS — E1165 Type 2 diabetes mellitus with hyperglycemia: Secondary | ICD-10-CM | POA: Diagnosis not present

## 2016-02-27 DIAGNOSIS — F329 Major depressive disorder, single episode, unspecified: Secondary | ICD-10-CM | POA: Diagnosis not present

## 2016-02-27 DIAGNOSIS — E034 Atrophy of thyroid (acquired): Secondary | ICD-10-CM

## 2016-02-27 DIAGNOSIS — R600 Localized edema: Secondary | ICD-10-CM

## 2016-02-27 DIAGNOSIS — N183 Chronic kidney disease, stage 3 unspecified: Secondary | ICD-10-CM

## 2016-02-27 DIAGNOSIS — Z794 Long term (current) use of insulin: Secondary | ICD-10-CM

## 2016-02-27 DIAGNOSIS — IMO0002 Reserved for concepts with insufficient information to code with codable children: Secondary | ICD-10-CM

## 2016-02-27 DIAGNOSIS — F32A Depression, unspecified: Secondary | ICD-10-CM

## 2016-02-27 DIAGNOSIS — E038 Other specified hypothyroidism: Secondary | ICD-10-CM | POA: Diagnosis not present

## 2016-02-27 DIAGNOSIS — F419 Anxiety disorder, unspecified: Secondary | ICD-10-CM

## 2016-02-27 DIAGNOSIS — K7030 Alcoholic cirrhosis of liver without ascites: Secondary | ICD-10-CM

## 2016-02-27 DIAGNOSIS — E1140 Type 2 diabetes mellitus with diabetic neuropathy, unspecified: Secondary | ICD-10-CM

## 2016-02-27 NOTE — Progress Notes (Signed)
Patient ID: [name], male   DOB: 1946-03-31, 70 y.o.   MRN: [id number]   Facility: [facility]       Allergies  Allergen Reactions  . Penicillins Hives    Chief Complaint  Patient presents with  . Medical Management of Chronic Issues    Follow-up    HPI:  He is a long term resident of this facility being seen for the management of his chronic illnesses. He is followed by hospice care. He tells me that he is feeling good except for his left heel; which has a sore and does cause him some pain. He states that his pain is managed. There are no nursing concerns at this time.    Past Medical History  Diagnosis Date  . Hyperglycemia   . Hyponatremia   . DMII (diabetes mellitus, type 2) (HCC)   . Depression   . IBS (irritable bowel syndrome)   . Chest pain   . Unspecified gastritis and gastroduodenitis without mention of hemorrhage   . Arthropathy, unspecified, site unspecified   . History of colon polyps   . Anemia, unspecified   . Anxiety   . GERD (gastroesophageal reflux disease)   . Hyperlipidemia   . Hypertension   . Liver problem     Past Surgical History  Procedure Laterality Date  . Hernia repair  1964  . Intramedullary (im) nail intertrochanteric Left 01/07/2015    Procedure: INTRAMEDULLARY (IM) NAIL INTERTROCHANTRIC;  Surgeon: Kathryne Hitch, MD;  Location: [facility];  Service: Orthopedics;  Laterality: Left;  . Femur im nail Right 03/24/2015    Procedure: CLOSED REDUCTION INTERNAL FIXATION;  Surgeon: Kerrin Champagne, MD;  Location: [facility] OR;  Service: Orthopedics;  Laterality: Right;  . Esophagogastroduodenoscopy N/A 04/21/2015    Procedure: ESOPHAGOGASTRODUODENOSCOPY (EGD);  Surgeon: Willis Modena, MD;  Location: [facility] ENDOSCOPY;  Service: Endoscopy;  Laterality: N/A;  . Givens capsule study N/A 04/21/2015    Procedure: GIVENS CAPSULE STUDY;  Surgeon: Willis Modena, MD;  Location: [facility] ENDOSCOPY;  Service: Endoscopy;  Laterality: N/A;  . Fracture  surgery      VITAL SIGNS BP 172/38 mmHg  Pulse 20  Temp(Src) 98 F (36.7 C) (Oral)  Resp 18  Ht  (1.676 m)  Wt 149 lb 4 oz (67.699 kg)  BMI 24.10 kg/m2  Patient's Medications  New Prescriptions   No medications on file  Previous Medications   BISACODYL (DULCOLAX) 10 MG SUPPOSITORY    Place 10 mg rectally as needed for moderate constipation.   CARBAMIDE PEROXIDE (DEBROX) 6.5 % OTIC SOLUTION    Place 5 drops into both ears 2 (two) times daily. X 3 days starting 11/11/15   DIPHENHYDRAMINE (BENADRYL) 12.5 MG CHEWABLE TABLET    Chew 12.5 mg by mouth every 6 (six) hours as needed for allergies.   FUROSEMIDE (LASIX) 20 MG TABLET    Take 20 mg by mouth.   HYPROMELLOSE (ARTIFICIAL TEARS OP)    Apply to eye. Two drops ou tid for dry eyes.   INSULIN ASPART (NOVOLOG) 100 UNIT/ML INJECTION    Inject 0-9 Units into the skin 3 (three) times daily with meals. Sliding scale CBG 70 - 120: 0 units CBG 121 - 150: 1 unit,  CBG 151 - 200: 2 units,  CBG 201 - 250: 3 units,  CBG 251 - 300: 5 units,  CBG 301 - 350: 7 units,  CBG 351 - 400: 9 units   CBG > 400: 9 units and notify your  MD   INSULIN DETEMIR (LEVEMIR FLEXPEN) 100 UNIT/ML PEN    Inject 24 Units into the skin at bedtime.    LACTULOSE (CHRONULAC) 10 GM/15ML SOLUTION    Take 30 g by mouth 3 (three) times daily. Titrate for goal 3-4 BM's a day   LAMOTRIGINE (LAMICTAL) 100 MG TABLET    Take 100 mg by mouth daily.   LEVOTHYROXINE (SYNTHROID, LEVOTHROID) 25 MCG TABLET    Take 25 mcg by mouth daily.   MORPHINE (ROXANOL) 20 MG/ML CONCENTRATED SOLUTION    Give 0.45ml by mouth every 4 hours as needed for pain   MORPHINE SULFATE (MORPHINE CONCENTRATE) 10 MG / 0.5 ML CONCENTRATED SOLUTION    Take 1ml by mouth every 12 hours for pain   OXYCODONE (OXY IR/ROXICODONE) 5 MG IMMEDIATE RELEASE TABLET    Take 5 mg by mouth every 6 (six) hours as needed for severe pain.   PHENYLEPHRINE-SHARK LIVER OIL-MINERAL OIL-PETROLATUM (PREPARATION H) 0.25-3-14-71.9 % RECTAL  OINTMENT    Place 1 application rectally 2 (two) times daily as needed for hemorrhoids.   POLYETHYLENE GLYCOL (MIRALAX / GLYCOLAX) PACKET    Take 17 g by mouth daily as needed for mild constipation.    PROMETHAZINE (PHENERGAN) 12.5 MG TABLET    Take 25 mg by mouth every 4 (four) hours as needed for nausea or vomiting.    QUETIAPINE (SEROQUEL) 50 MG TABLET    Take 50 mg by mouth at bedtime.   SKIN PROTECTANTS, MISC. (EUCERIN) CREAM    Apply 1 application topically 2 (two) times daily.   SODIUM CHLORIDE (OCEAN) 0.65 % SOLN NASAL SPRAY    Place 1 spray into both nostrils 3 (three) times daily as needed (for nosebleed).   SPIRONOLACTONE (ALDACTONE) 50 MG TABLET    Take 50 mg by mouth 2 (two) times daily.   TAMSULOSIN (FLOMAX) 0.4 MG CAPS CAPSULE    Take 0.4 mg by mouth 3 (three) times daily as needed.   Modified Medications   No medications on file  Discontinued Medications   No medications on file     SIGNIFICANT DIAGNOSTIC EXAMS  02-06-16: bilateral lower extremity doppler: negative for dvt   LABS REVIEWED:   09-09-15: ammonia: 215  02-06-16: glucose 304;bun 25; creat 1.21; k+ 4.8; na++131      Review of Systems  Constitutional: Negative for malaise/fatigue.  Respiratory: Negative for cough and shortness of breath.   Cardiovascular: Negative for chest pain, palpitations and leg swelling.  Gastrointestinal: Negative for heartburn, abdominal pain and constipation.  Musculoskeletal: Negative for myalgias, back pain and joint pain.  Skin:       Has sore on left heel.   Neurological: Negative for dizziness.  Psychiatric/Behavioral: The patient is not nervous/anxious.     Physical Exam  Constitutional: No distress.  Eyes: Conjunctivae are normal.  Neck: Neck supple. No JVD present. No thyromegaly present.  Cardiovascular: Normal rate, regular rhythm and intact distal pulses.   Respiratory: Effort normal and breath sounds normal. No respiratory distress. He has no wheezes.  GI:  Soft. Bowel sounds are normal. He exhibits no distension. There is no tenderness.  Musculoskeletal: He exhibits edema.  Able to move all extremities  Has trace pedal edema   Lymphadenopathy:    He has no cervical adenopathy.  Neurological: He is alert.  Skin: Skin is warm and dry. He is not diaphoretic.  Left heel with necrotic area present no signs of infection present   Psychiatric: He has a normal mood and affect.  ASSESSMENT/ PLAN:  1. Lower extremity edema: will continue lasix 20 mg daily will monitor  2. Diabetes: will continue levemir 24 units nightly will continue novolog SSI; will monitor  3. Hypothyroidism: will continue synthroid 25 mcg daily   4. BPH: will continue flomax 0.4 mg daily   5. Depression with anxiety: will continue his lamictal 100 mg daily to help stabilize mood; will continue seroquel 50 mg nightly   6. Constipation: will continue miralax daily as needed; will continue lactulose 45 cc three times daily  7. Chronic kidney disease stage III: will monitor  8. Liver cirrhosis with metabolic encephalopathy: no change in his status will continue lasix 20 mg daily with aldactone 50 mg twice daily and lactulose 45 cc three times daily   9. Chronic pain: will continue roxanol 10 mg twice daily routinely and 5 mg every 4 hours as needed also has oxycodone 5 mg every 6 hours as needed     Time spent with patient  45  minutes >50% time spent counseling; reviewing medical record; tests; labs; and developing future plan of care    Synthia Innocenteborah Green NP [facility]  Contact [phone] Monday through Friday 8am- 5pm  After hours call [phone]

## 2016-03-05 ENCOUNTER — Encounter: Payer: Self-pay | Admitting: Adult Health

## 2016-03-05 ENCOUNTER — Non-Acute Institutional Stay (SKILLED_NURSING_FACILITY): Payer: Medicare Other | Admitting: Adult Health

## 2016-03-05 DIAGNOSIS — G894 Chronic pain syndrome: Secondary | ICD-10-CM

## 2016-03-05 NOTE — Progress Notes (Signed)
Patient ID: [name], male   DOB: [birth year], 70 y.o.   MRN: [id number]   Facility: [name]       Allergies  Allergen Reactions  . Penicillins Hives    Chief Complaint  Patient presents with  . Acute Visit    Pain Management    HPI:  His family is concerned about his pain management issues. They feel as though he is not getting enough pain medication and would like his roxanol on a routine basis. He is not complaining of pain at this time; however; I am concerned that he is telling his family he is in pain; but not telling me.    Past Medical History  Diagnosis Date  . Hyperglycemia   . Hyponatremia   . DMII (diabetes mellitus, type 2) (HCC)   . Depression   . IBS (irritable bowel syndrome)   . Chest pain   . Unspecified gastritis and gastroduodenitis without mention of hemorrhage   . Arthropathy, unspecified, site unspecified   . History of colon polyps   . Anemia, unspecified   . Anxiety   . GERD (gastroesophageal reflux disease)   . Hyperlipidemia   . Hypertension   . Liver problem     Past Surgical History  Procedure Laterality Date  . Hernia repair  1964  . Intramedullary (im) nail intertrochanteric Left 01/07/2015    Procedure: INTRAMEDULLARY (IM) NAIL INTERTROCHANTRIC;  Surgeon: Kathryne Hitch, MD;  Location: [facility];  Service: Orthopedics;  Laterality: Left;  . Femur im nail Right 03/24/2015    Procedure: CLOSED REDUCTION INTERNAL FIXATION;  Surgeon: Kerrin Champagne, MD;  Location: [facility] OR;  Service: Orthopedics;  Laterality: Right;  . Esophagogastroduodenoscopy N/A 04/21/2015    Procedure: ESOPHAGOGASTRODUODENOSCOPY (EGD);  Surgeon: Willis Modena, MD;  Location: [facility] ENDOSCOPY;  Service: Endoscopy;  Laterality: N/A;  . Givens capsule study N/A 04/21/2015    Procedure: GIVENS CAPSULE STUDY;  Surgeon: Willis Modena, MD;  Location: [facility] ENDOSCOPY;  Service: Endoscopy;  Laterality: N/A;  . Fracture surgery      VITAL SIGNS There were no vitals taken  for this visit.  Patient's Medications  New Prescriptions   No medications on file  Previous Medications   BISACODYL (DULCOLAX) 10 MG SUPPOSITORY    Place 10 mg rectally as needed for moderate constipation.   CARBAMIDE PEROXIDE (DEBROX) 6.5 % OTIC SOLUTION    Place 5 drops into both ears 2 (two) times daily. X 3 days starting 11/11/15   DIPHENHYDRAMINE (BENADRYL) 12.5 MG CHEWABLE TABLET    Chew 12.5 mg by mouth every 6 (six) hours as needed for allergies.   FUROSEMIDE (LASIX) 20 MG TABLET    Take 20 mg by mouth.   HYPROMELLOSE (ARTIFICIAL TEARS OP)    Apply to eye. Two drops ou tid for dry eyes.   INSULIN ASPART (NOVOLOG) 100 UNIT/ML INJECTION    Inject 0-9 Units into the skin 3 (three) times daily with meals. Sliding scale CBG 70 - 120: 0 units CBG 121 - 150: 1 unit,  CBG 151 - 200: 2 units,  CBG 201 - 250: 3 units,  CBG 251 - 300: 5 units,  CBG 301 - 350: 7 units,  CBG 351 - 400: 9 units   CBG > 400: 9 units and notify your MD   INSULIN DETEMIR (LEVEMIR FLEXPEN) 100 UNIT/ML PEN    Inject 26 Units into the skin at bedtime.    LACTULOSE (CHRONULAC) 10 GM/15ML SOLUTION    Take 30 g by  mouth 3 (three) times daily. Titrate for goal 3-4 BM's a day   LAMOTRIGINE (LAMICTAL) 100 MG TABLET    Take 100 mg by mouth daily.   LEVOTHYROXINE (SYNTHROID, LEVOTHROID) 25 MCG TABLET    Take 25 mcg by mouth daily.   MORPHINE SULFATE (MORPHINE CONCENTRATE) 10 MG / 0.5 ML CONCENTRATED SOLUTION    Take 1ml by mouth every 12 hours for pain   ONDANSETRON (ZOFRAN) 4 MG TABLET    Take 4 mg by mouth 3 (three) times daily as needed for nausea or vomiting.   OXYCODONE (OXY IR/ROXICODONE) 5 MG IMMEDIATE RELEASE TABLET    Take 5 mg by mouth every 6 (six) hours as needed for severe pain.   PHENYLEPHRINE-SHARK LIVER OIL-MINERAL OIL-PETROLATUM (PREPARATION H) 0.25-3-14-71.9 % RECTAL OINTMENT    Place 1 application rectally 2 (two) times daily as needed for hemorrhoids.   POLYETHYLENE GLYCOL (MIRALAX / GLYCOLAX) PACKET    Take 17  g by mouth daily as needed for mild constipation.    PROMETHAZINE (PHENERGAN) 12.5 MG TABLET    Take 25 mg by mouth every 4 (four) hours as needed for nausea or vomiting.    QUETIAPINE (SEROQUEL) 50 MG TABLET    Take 50 mg by mouth at bedtime.   SKIN PROTECTANTS, MISC. (EUCERIN) CREAM    Apply 1 application topically 2 (two) times daily.   SODIUM CHLORIDE (OCEAN) 0.65 % SOLN NASAL SPRAY    Place 1 spray into both nostrils 3 (three) times daily as needed (for nosebleed).   SPIRONOLACTONE (ALDACTONE) 50 MG TABLET    Take 50 mg by mouth 2 (two) times daily.   TAMSULOSIN (FLOMAX) 0.4 MG CAPS CAPSULE    Take 0.4 mg by mouth daily.   Modified Medications   No medications on file  Discontinued Medications     SIGNIFICANT DIAGNOSTIC EXAMS   02-06-16: bilateral lower extremity doppler: negative for dvt   LABS REVIEWED:   09-09-15: ammonia: 215  02-06-16: glucose 304;bun 25; creat 1.21; k+ 4.8; na++131      Review of Systems  Constitutional: Negative for malaise/fatigue.  Respiratory: Negative for cough and shortness of breath.   Cardiovascular: Negative for chest pain, palpitations and leg swelling.  Gastrointestinal: Negative for heartburn, abdominal pain and constipation.  Musculoskeletal: Negative for myalgias, back pain and joint pain.  Skin:       Has sore on left heel.   Neurological: Negative for dizziness.  Psychiatric/Behavioral: The patient is not nervous/anxious.     Physical Exam  Constitutional: No distress.  Eyes: Conjunctivae are normal.  Neck: Neck supple. No JVD present. No thyromegaly present.  Cardiovascular: Normal rate, regular rhythm and intact distal pulses.   Respiratory: Effort normal and breath sounds normal. No respiratory distress. He has no wheezes.  GI: Soft. Bowel sounds are normal. He exhibits no distension. There is no tenderness.  Musculoskeletal: He exhibits edema.  Able to move all extremities  Has trace pedal edema   Lymphadenopathy:    He  has no cervical adenopathy.  Neurological: He is alert.  Skin: Skin is warm and dry. He is not diaphoretic.  Left heel with necrotic area present no signs of infection present   Psychiatric: He has a normal mood and affect.      ASSESSMENT/ PLAN:  1. Chronic pain: will change the roxanol to 10 mg every 8 hours routinely and 5 mg every 4 hours as needed for pain and will monitor his status.  Ok Edwards NP [facility]  Contact [phone] Monday through Friday 8am- 5pm  After hours call [phone]

## 2016-03-09 ENCOUNTER — Ambulatory Visit: Payer: Self-pay | Admitting: Internal Medicine

## 2016-03-11 DIAGNOSIS — G894 Chronic pain syndrome: Secondary | ICD-10-CM | POA: Insufficient documentation

## 2016-04-12 ENCOUNTER — Telehealth: Payer: Self-pay | Admitting: Oncology

## 2016-04-12 NOTE — Telephone Encounter (Signed)
[phone]

## 2016-04-14 LAB — BASIC METABOLIC PANEL
BUN: 34 mg/dL — AB (ref 4–21)
Creatinine: 1.3 mg/dL (ref 0.6–1.3)
GLUCOSE: 240 mg/dL
POTASSIUM: 4.6 mmol/L (ref 3.4–5.3)
Sodium: 138 mmol/L (ref 137–147)

## 2016-04-14 LAB — CBC AND DIFFERENTIAL
HCT: 29 % — AB (ref 41–53)
Hemoglobin: 10 g/dL — AB (ref 13.5–17.5)
Platelets: 59 10*3/uL — AB (ref 150–399)
WBC: 2.4 10^3/mL

## 2016-04-14 LAB — HEPATIC FUNCTION PANEL
ALT: 17 U/L (ref 10–40)
AST: 24 U/L (ref 14–40)
Alkaline Phosphatase: 103 U/L (ref 25–125)
Bilirubin, Total: 0.8 mg/dL

## 2016-04-19 ENCOUNTER — Non-Acute Institutional Stay (SKILLED_NURSING_FACILITY): Payer: Medicare Other | Admitting: Internal Medicine

## 2016-04-19 ENCOUNTER — Encounter: Payer: Self-pay | Admitting: Internal Medicine

## 2016-04-19 DIAGNOSIS — K219 Gastro-esophageal reflux disease without esophagitis: Secondary | ICD-10-CM | POA: Diagnosis not present

## 2016-04-19 DIAGNOSIS — F418 Other specified anxiety disorders: Secondary | ICD-10-CM

## 2016-04-19 DIAGNOSIS — E034 Atrophy of thyroid (acquired): Secondary | ICD-10-CM

## 2016-04-19 DIAGNOSIS — E1165 Type 2 diabetes mellitus with hyperglycemia: Secondary | ICD-10-CM

## 2016-04-19 DIAGNOSIS — K7290 Hepatic failure, unspecified without coma: Secondary | ICD-10-CM | POA: Diagnosis not present

## 2016-04-19 DIAGNOSIS — K7682 Hepatic encephalopathy: Secondary | ICD-10-CM

## 2016-04-19 DIAGNOSIS — D61818 Other pancytopenia: Secondary | ICD-10-CM

## 2016-04-19 DIAGNOSIS — IMO0002 Reserved for concepts with insufficient information to code with codable children: Secondary | ICD-10-CM

## 2016-04-19 DIAGNOSIS — E038 Other specified hypothyroidism: Secondary | ICD-10-CM | POA: Diagnosis not present

## 2016-04-19 DIAGNOSIS — I10 Essential (primary) hypertension: Secondary | ICD-10-CM

## 2016-04-19 DIAGNOSIS — K7030 Alcoholic cirrhosis of liver without ascites: Secondary | ICD-10-CM

## 2016-04-19 DIAGNOSIS — R600 Localized edema: Secondary | ICD-10-CM

## 2016-04-19 DIAGNOSIS — E1140 Type 2 diabetes mellitus with diabetic neuropathy, unspecified: Secondary | ICD-10-CM | POA: Diagnosis not present

## 2016-04-19 DIAGNOSIS — L89600 Pressure ulcer of unspecified heel, unstageable: Secondary | ICD-10-CM

## 2016-04-19 DIAGNOSIS — G894 Chronic pain syndrome: Secondary | ICD-10-CM

## 2016-04-19 DIAGNOSIS — Z794 Long term (current) use of insulin: Secondary | ICD-10-CM | POA: Diagnosis not present

## 2016-04-19 NOTE — Progress Notes (Signed)
HISTORY AND PHYSICAL   DATE: 04/19/16  Location:  [facility]  Nursing Home Room Number: 109 A Place of Service: SNF (31)   Extended Emergency Contact Information Primary Emergency Contact: [name],[name] Address: [address]          [place], [place] Work Phone: [phone] Mobile Phone: [phone] Relation: Sister  Advanced Directive information Does patient have an advance directive?: Yes, Type of Advance Directive: Out of facility DNR (pink MOST or yellow form), Does patient want to make changes to advanced directive?: No - Patient declined  Chief Complaint  Patient presents with  . Readmit To SNF    HPI:  70 yo male seen today as a readmission from [facility] for b/l unstageable heel ulcers, hepatic cirrhosis, hepatic encephalopathy, thrombocytopenia, anemia of chronic disease and depression with anxiety. B/l heel ulcers debrided during stay. He had hepatic encephalopathy to develop. When he became stable, [facility]  He has no c/o today. He has memory issues. No N/V. He has chronic pain. He would like palliative care eval. He revoked hospice care prior to transfer to [facility]. He is a poor historian due to encephalopathy. Hx obtained from chart  Lower extremity edema: will continue lasix 20 mg daily  DM - stable on levemir and  novolog SSI. No low BS reactions  Hypothyroidism - stable on synthroid 25 mcg daily   BPH - stable on flomax 0.4 mg daily   Depression with anxiety - mood stable on seroquel 50 mg nightly; atarax prn  Constipation - stable on lactulose  Hx CKD stage 3 - stable. Cr 1.3  Liver cirrhosis with metabolic encephalopathy -  Stable on spironolactone, lactulose  and rifaximin   Chronic pain - stable on dilaudid and OxyIR  Pancytopenia - Hgb 10; Plts 59K (trending down from 63K in Jun 2016); WBC 2.4K  Past Medical History  Diagnosis Date  . Hyperglycemia   .  Hyponatremia   . DMII (diabetes mellitus, type 2) (HCC)   . Depression   . IBS (irritable bowel syndrome)   . Chest pain   . Unspecified gastritis and gastroduodenitis without mention of hemorrhage   . Arthropathy, unspecified, site unspecified   . History of colon polyps   . Anemia, unspecified   . Anxiety   . GERD (gastroesophageal reflux disease)   . Hyperlipidemia   . Hypertension   . Liver problem     Past Surgical History  Procedure Laterality Date  . Hernia repair  1964  . Intramedullary (im) nail intertrochanteric Left 01/07/2015    Procedure: INTRAMEDULLARY (IM) NAIL INTERTROCHANTRIC;  Surgeon: Kathryne Hitchhristopher Y Blackman, MD;  Location: [facility] ORS;  Service: Orthopedics;  Laterality: Left;  . Femur im nail Right 03/24/2015    Procedure: CLOSED REDUCTION INTERNAL FIXATION;  Surgeon: Kerrin ChampagneJames E Nitka, MD;  Location: [facility] OR;  Service: Orthopedics;  Laterality: Right;  . Esophagogastroduodenoscopy N/A 04/21/2015    Procedure: ESOPHAGOGASTRODUODENOSCOPY (EGD);  Surgeon: Willis ModenaWilliam Outlaw, MD;  [facility];  Service: Endoscopy;  Laterality: N/A;  . Givens capsule study N/A 04/21/2015    Procedure: GIVENS CAPSULE STUDY;  Surgeon: Willis ModenaWilliam Outlaw, MD;  [facility];  Service: Endoscopy;  Laterality: N/A;  . Fracture surgery      Patient Care Team: Margit HanksAnne D [name], MD as PCP - General (Internal Medicine)  Social History   Social History  . Marital Status: Divorced    Spouse Name: N/A  . Number  of Children: N/A  . Years of Education: N/A   Occupational History  . Not on file.   Social History Main Topics  . Smoking status: Former Smoker -- 1.00 packs/day    Types: Cigarettes  . Smokeless tobacco: Not on file  . Alcohol Use: No  . Drug Use: No  . Sexual Activity: Yes   Other Topics Concern  . Not on file   Social History Narrative     reports that he has quit smoking. His smoking use included Cigarettes. He smoked 1.00 pack per day. He does not have any  smokeless tobacco history on file. He reports that he does not drink alcohol or use illicit drugs.  Family History  Problem Relation Age of Onset  . Stroke Mother   . Heart attack Father   . Heart disease Father   . Cancer Other     FH of Breast Cancer-other Relative  . Heart disease Other     Parent  . Hypertension Other     parent  . Cancer Maternal Uncle     colon   Family Status  Relation Status Death Age  . Mother Deceased 57  . Father Deceased 29    MI    Immunization History  Administered Date(s) Administered  . Influenza-Unspecified 09/13/2011, 09/14/2015  . PPD Test 01/07/2014, 03/27/2015  . Pneumococcal Polysaccharide-23 04/22/2015  . Pneumococcal-Unspecified 12/13/2009  . Tdap 12/13/2009    Allergies  Allergen Reactions  . Penicillins Hives    Medications: Patient's Medications  New Prescriptions   No medications on file  Previous Medications   FUROSEMIDE (LASIX) 20 MG TABLET    Take 20 mg by mouth.   HYDROMORPHONE (DILAUDID) 2 MG TABLET    Take 1 mg by mouth every 6 (six) hours as needed for severe pain.   HYDROXYZINE (ATARAX/VISTARIL) 25 MG TABLET    Take 25 mg by mouth every 8 (eight) hours as needed.   INSULIN ASPART (NOVOLOG) 100 UNIT/ML INJECTION    Inject 5 Units into the skin 3 (three) times daily before meals. Use sliding scale based on CBG.   INSULIN DETEMIR (LEVEMIR FLEXPEN) 100 UNIT/ML PEN    Inject 14 Units into the skin at bedtime.    LACTULOSE (CHRONULAC) 10 GM/15ML SOLUTION    Take 40 g by mouth every 6 (six) hours.   LEVOTHYROXINE (SYNTHROID, LEVOTHROID) 25 MCG TABLET    Take 25 mcg by mouth daily.   OXYCODONE (OXY IR/ROXICODONE) 5 MG IMMEDIATE RELEASE TABLET    Take 5 mg by mouth every 6 (six) hours as needed for severe pain.   QUETIAPINE (SEROQUEL) 50 MG TABLET    Take 50 mg by mouth at bedtime.   RIFAXIMIN (XIFAXAN) 550 MG TABS TABLET    Take 550 mg by mouth 2 (two) times daily.   SPIRONOLACTONE (ALDACTONE) 50 MG TABLET    Take 50 mg  by mouth 2 (two) times daily.   TAMSULOSIN (FLOMAX) 0.4 MG CAPS CAPSULE    Take 0.4 mg by mouth daily.   Modified Medications   No medications on file  Discontinued Medications   BISACODYL (DULCOLAX) 10 MG SUPPOSITORY    Place 10 mg rectally as needed for moderate constipation.   CARBAMIDE PEROXIDE (DEBROX) 6.5 % OTIC SOLUTION    Place 5 drops into both ears 2 (two) times daily. X 3 days starting 11/11/15   DIPHENHYDRAMINE (BENADRYL) 12.5 MG CHEWABLE TABLET    Chew 12.5 mg by mouth every 6 (six) hours as needed  for allergies.   HYPROMELLOSE (ARTIFICIAL TEARS OP)    Apply to eye. Two drops ou tid for dry eyes.   INSULIN ASPART (NOVOLOG) 100 UNIT/ML INJECTION    Inject 0-9 Units into the skin 3 (three) times daily with meals. Sliding scale CBG 70 - 120: 0 units CBG 121 - 150: 1 unit,  CBG 151 - 200: 2 units,  CBG 201 - 250: 3 units,  CBG 251 - 300: 5 units,  CBG 301 - 350: 7 units,  CBG 351 - 400: 9 units   CBG > 400: 9 units and notify your MD   LACTULOSE (CHRONULAC) 10 GM/15ML SOLUTION    Take 30 g by mouth 3 (three) times daily. Titrate for goal 3-4 BM's a day   LAMOTRIGINE (LAMICTAL) 100 MG TABLET    Take 100 mg by mouth daily.   MORPHINE SULFATE (MORPHINE CONCENTRATE) 10 MG / 0.5 ML CONCENTRATED SOLUTION    Take 1ml by mouth every 12 hours for pain   ONDANSETRON (ZOFRAN) 4 MG TABLET    Take 4 mg by mouth 3 (three) times daily as needed for nausea or vomiting.   PHENYLEPHRINE-SHARK LIVER OIL-MINERAL OIL-PETROLATUM (PREPARATION H) 0.25-3-14-71.9 % RECTAL OINTMENT    Place 1 application rectally 2 (two) times daily as needed for hemorrhoids.   POLYETHYLENE GLYCOL (MIRALAX / GLYCOLAX) PACKET    Take 17 g by mouth daily as needed for mild constipation.    PROMETHAZINE (PHENERGAN) 12.5 MG TABLET    Take 25 mg by mouth every 4 (four) hours as needed for nausea or vomiting.    SKIN PROTECTANTS, MISC. (EUCERIN) CREAM    Apply 1 application topically 2 (two) times daily.   SODIUM CHLORIDE (OCEAN) 0.65 %  SOLN NASAL SPRAY    Place 1 spray into both nostrils 3 (three) times daily as needed (for nosebleed).    Review of Systems  Unable to perform ROS: Other  encephalopathy  Filed Vitals:   04/19/16 1118  BP: 172/38  Pulse: 75  Temp: 98 F (36.7 C)  TempSrc: Oral  Resp: 20  Height: 5\' 6"  (1.676 m)  Weight: 149 lb 3.2 oz (67.677 kg)  SpO2: 98%   Body mass index is 24.09 kg/(m^2).  Physical Exam  Constitutional: He appears well-developed. No distress.  Frail appearing in NAD, sitting on edge of bed.  HENT:  Mouth/Throat: Oropharynx is clear and moist.  Eyes: Pupils are equal, round, and reactive to light. No scleral icterus.  Neck: Neck supple. Carotid bruit is not present.  Cardiovascular: Normal rate, regular rhythm and intact distal pulses.  Exam reveals no gallop and no friction rub.   Murmur (1/6 SEM) heard. Trace LE edema b/l. No calf TTP  Pulmonary/Chest: Effort normal and breath sounds normal. He has no wheezes. He has no rales. He exhibits no tenderness.  Abdominal: Soft. Bowel sounds are normal. He exhibits no distension, no abdominal bruit, no pulsatile midline mass and no mass. There is no tenderness. There is no rebound and no guarding.  Musculoskeletal: He exhibits edema.  Lymphadenopathy:    He has no cervical adenopathy.  Neurological: He is alert.  Skin: Skin is warm and dry. No rash noted.  B/l heel wrapped with dsg intact. Followed by wound care. Right heel has necrotic tissue; left heel with clean base. No secondary signs of infection  Psychiatric: He has a normal mood and affect. His behavior is normal.     Labs reviewed: Nursing Home on 04/19/2016  Component Date Value  Ref Range Status  . Hemoglobin 04/14/2016 10.0* 13.5 - 17.5 g/dL Final  . HCT 40/09/2724 29* 41 - 53 % Final  . Platelets 04/14/2016 59* 150 - 399 K/L Final  . WBC 04/14/2016 2.4   Final  . Glucose 04/14/2016 240   Final  . BUN 04/14/2016 34* 4 - 21 mg/dL Final  . Creatinine  36/64/4034 1.3  0.6 - 1.3 mg/dL Final  . Potassium 74/25/9563 4.6  3.4 - 5.3 mmol/L Final  . Sodium 04/14/2016 138  137 - 147 mmol/L Final  . Alkaline Phosphatase 04/14/2016 103  25 - 125 U/L Final  . ALT 04/14/2016 17  10 - 40 U/L Final  . AST 04/14/2016 24  14 - 40 U/L Final  . Bilirubin, Total 04/14/2016 0.8   Final  Nursing Home on 02/27/2016  Component Date Value Ref Range Status  . Glucose 02/07/2016 304   Final  . BUN 02/07/2016 25* 4 - 21 mg/dL Final  . Creatinine 87/56/4332 1.2  0.6 - 1.3 mg/dL Final  . Potassium 95/18/8416 4.8  3.4 - 5.3 mmol/L Final  . Sodium 02/07/2016 131* 137 - 147 mmol/L Final    No results found.   Assessment/Plan   ICD-9-CM ICD-10-CM   1. Unstageable pressure ulcer of heel, unspecified laterality (HCC) 707.07 L89.600    707.25     R>L; s/p debridement at [facility]   2. Alcoholic cirrhosis of liver without ascites (HCC) 571.2 K70.30   3. Encephalopathy, hepatic (HCC) 572.2 K72.90   4. Edema of both legs 782.3 R60.0   5. Depression with anxiety 300.4 F41.8   6. Chronic pain syndrome 338.4 G89.4   7. Uncontrolled type 2 diabetes mellitus with diabetic neuropathy, with long-term current use of insulin (HCC) 250.62 E11.40    357.2 Z79.4    V58.67 E11.65   8. Hypothyroidism due to acquired atrophy of thyroid 244.8 E03.8    246.8 E03.4   9. Gastroesophageal reflux disease without esophagitis 530.81 K21.9   10. Essential hypertension 401.9 I10   11. Other pancytopenia (HCC) 284.19 S06.301     Consult palliative care due to hepatic encephalopathy, hepatic cirrhosis. He was previously a hospice pt prior to transfer to [facility]  Wound care as ordered  Cont current meds as ordered  PT/OT/ST as ordered  GOAL: [facility] rehab followed by long term care. Communicated with pt and nursing.  Will follow  Jatasia Gundrum S. Ancil Linsey  [facility] [address] [place], [place] [address] [phone] Cell  (Monday-Friday 8 AM - 5 PM) [phone] After 5 PM and follow prompts

## 2016-04-27 ENCOUNTER — Encounter: Payer: PRIVATE HEALTH INSURANCE | Attending: Internal Medicine | Admitting: Internal Medicine

## 2016-04-27 DIAGNOSIS — K7290 Hepatic failure, unspecified without coma: Secondary | ICD-10-CM | POA: Insufficient documentation

## 2016-04-27 DIAGNOSIS — D61818 Other pancytopenia: Secondary | ICD-10-CM | POA: Insufficient documentation

## 2016-04-27 DIAGNOSIS — L89614 Pressure ulcer of right heel, stage 4: Secondary | ICD-10-CM | POA: Diagnosis not present

## 2016-04-27 DIAGNOSIS — Z794 Long term (current) use of insulin: Secondary | ICD-10-CM | POA: Insufficient documentation

## 2016-04-27 DIAGNOSIS — L89623 Pressure ulcer of left heel, stage 3: Secondary | ICD-10-CM | POA: Insufficient documentation

## 2016-04-27 DIAGNOSIS — I350 Nonrheumatic aortic (valve) stenosis: Secondary | ICD-10-CM | POA: Insufficient documentation

## 2016-04-27 DIAGNOSIS — K7460 Unspecified cirrhosis of liver: Secondary | ICD-10-CM | POA: Insufficient documentation

## 2016-04-27 DIAGNOSIS — K766 Portal hypertension: Secondary | ICD-10-CM | POA: Insufficient documentation

## 2016-04-27 DIAGNOSIS — E11621 Type 2 diabetes mellitus with foot ulcer: Secondary | ICD-10-CM | POA: Insufficient documentation

## 2016-04-27 DIAGNOSIS — E1142 Type 2 diabetes mellitus with diabetic polyneuropathy: Secondary | ICD-10-CM | POA: Diagnosis not present

## 2016-04-29 ENCOUNTER — Non-Acute Institutional Stay (SKILLED_NURSING_FACILITY): Payer: Medicare Other | Admitting: Internal Medicine

## 2016-04-29 ENCOUNTER — Encounter: Payer: Self-pay | Admitting: Internal Medicine

## 2016-04-29 DIAGNOSIS — K7030 Alcoholic cirrhosis of liver without ascites: Secondary | ICD-10-CM | POA: Diagnosis not present

## 2016-04-29 DIAGNOSIS — G894 Chronic pain syndrome: Secondary | ICD-10-CM | POA: Diagnosis not present

## 2016-04-29 DIAGNOSIS — L89600 Pressure ulcer of unspecified heel, unstageable: Secondary | ICD-10-CM

## 2016-04-29 DIAGNOSIS — F29 Unspecified psychosis not due to a substance or known physiological condition: Secondary | ICD-10-CM

## 2016-04-29 DIAGNOSIS — K7290 Hepatic failure, unspecified without coma: Secondary | ICD-10-CM

## 2016-04-29 DIAGNOSIS — F418 Other specified anxiety disorders: Secondary | ICD-10-CM

## 2016-04-29 DIAGNOSIS — K7682 Hepatic encephalopathy: Secondary | ICD-10-CM

## 2016-04-29 DIAGNOSIS — Z7189 Other specified counseling: Secondary | ICD-10-CM

## 2016-04-29 NOTE — Progress Notes (Signed)
DATE: 04/29/16  Location:  [facility]  Nursing Home Room Number: 122 B Place of Service: SNF (31)   Extended Emergency Contact Information Primary Emergency Contact: [name],[name] Address: [address]          [place], [place] Work Phone: [phone] Mobile Phone: [phone] Relation: Sister  Advanced Directive information Does patient have an advance directive?: Yes, Type of Advance Directive: Out of facility DNR (pink MOST or yellow form)  Chief Complaint  Patient presents with  . Medical Management of Chronic Issues    Routine Visit    HPI:  70 yo male long term resident seen today for family meeting to discuss his health. Sister, Park Meo, present along with family friend. Facility DON present also. Sister c/a pt's pain meds and would like to d/c Oxy IR as it causes him to be sedated and unresponsive. He tolerated dilaudid better when at [facility].  He has chronic pain and most of it localized to right thigh due to previous femur fx and right foot due to heel wound. We discussed timing of the pain meds. Also discussed role of lamictal for mood stabilization of psychosis he also takes seroquel for mood stabilization. Advanced directives reviewed. He is DNR. Family not interested in hospice at this time.   Lower extremity edema - stable on lasix 20 mg daily  Heel ulcers - followed by wound center. Recent right xrays neg osteo and fx. ABIs showed mild PAD  BPH - stable on flomax 0.4 mg daily   Depression with anxiety - mood stable on seroquel 50 mg nightly; atarax prn  Constipation - stable on lactulose  Liver cirrhosis with metabolic encephalopathy -  Stable on spironolactone, lactulose  and rifaximin. Hx Etoh abuse  Pancytopenia - Hgb 10; Plts 59K (trending down from 63K in Jun 2016); WBC 2.4K  Past Medical History  Diagnosis Date  . Hyperglycemia   . Hyponatremia   . DMII (diabetes mellitus, type 2) (HCC)   .  Depression   . IBS (irritable bowel syndrome)   . Chest pain   . Unspecified gastritis and gastroduodenitis without mention of hemorrhage   . Arthropathy, unspecified, site unspecified   . History of colon polyps   . Anemia, unspecified   . Anxiety   . GERD (gastroesophageal reflux disease)   . Hyperlipidemia   . Hypertension   . Liver problem     Past Surgical History  Procedure Laterality Date  . Hernia repair  1964  . Intramedullary (im) nail intertrochanteric Left 01/07/2015    Procedure: INTRAMEDULLARY (IM) NAIL INTERTROCHANTRIC;  Surgeon: Kathryne Hitch, MD;  Location: [facility] ORS;  Service: Orthopedics;  Laterality: Left;  . Femur im nail Right 03/24/2015    Procedure: CLOSED REDUCTION INTERNAL FIXATION;  Surgeon: Kerrin Champagne, MD;  Location: [facility] OR;  Service: Orthopedics;  Laterality: Right;  . Esophagogastroduodenoscopy N/A 04/21/2015    Procedure: ESOPHAGOGASTRODUODENOSCOPY (EGD);  Surgeon: Willis Modena, MD;  Location: [facility] ENDOSCOPY;  Service: Endoscopy;  Laterality: N/A;  . Givens capsule study N/A 04/21/2015    Procedure: GIVENS CAPSULE STUDY;  Surgeon: Willis Modena, MD;  Location: [facility] ENDOSCOPY;  Service: Endoscopy;  Laterality: N/A;  . Fracture surgery      Patient Care Team: Kirt Boys, DO as PCP - General (Internal Medicine)  Social History   Social History  . Marital Status: Divorced    Spouse Name: N/A  . Number of Children: N/A  . Years of Education:  N/A   Occupational History  . Not on file.   Social History Main Topics  . Smoking status: Former Smoker -- 1.00 packs/day    Types: Cigarettes  . Smokeless tobacco: Not on file  . Alcohol Use: No  . Drug Use: No  . Sexual Activity: Yes   Other Topics Concern  . Not on file   Social History Narrative     reports that he has quit smoking. His smoking use included Cigarettes. He smoked 1.00 pack per day. He does not have any smokeless tobacco history on file. He reports that he does not drink  alcohol or use illicit drugs.  Immunization History  Administered Date(s) Administered  . Influenza-Unspecified 09/13/2011, 09/14/2015  . PPD Test 01/07/2014, 03/27/2015  . Pneumococcal Polysaccharide-23 04/22/2015  . Pneumococcal-Unspecified 12/13/2009  . Tdap 12/13/2009    Allergies  Allergen Reactions  . Penicillins Hives    Medications: Patient's Medications  New Prescriptions   No medications on file  Previous Medications   AMBULATORY NON FORMULARY MEDICATION    Med Pass: Give 120 cc three times daily.   FUROSEMIDE (LASIX) 20 MG TABLET    Take 20 mg by mouth.   HYDROMORPHONE (DILAUDID) 2 MG TABLET    Take 1 mg by mouth every 6 (six) hours as needed for severe pain.   HYDROXYZINE (ATARAX/VISTARIL) 25 MG TABLET    Take 25 mg by mouth every 8 (eight) hours as needed.   INSULIN ASPART (NOVOLOG) 100 UNIT/ML INJECTION    Inject 5 Units into the skin 3 (three) times daily before meals. Use sliding scale based on CBG.   INSULIN DETEMIR (LEVEMIR FLEXPEN) 100 UNIT/ML PEN    Inject 14 Units into the skin at bedtime.    LACTULOSE (CHRONULAC) 10 GM/15ML SOLUTION    Take 40 g by mouth every 6 (six) hours.   LAMOTRIGINE (LAMICTAL) 100 MG TABLET    Take 100 mg by mouth daily.   LEVOTHYROXINE (SYNTHROID, LEVOTHROID) 25 MCG TABLET    Take 25 mcg by mouth daily.   OXYCODONE (OXY IR/ROXICODONE) 5 MG IMMEDIATE RELEASE TABLET    Take 5 mg by mouth every 6 (six) hours as needed for severe pain.   PHENYLEPHRINE-SHARK LIVER OIL-MINERAL OIL-PETROLATUM (PREPARATION H) 0.25-3-14-71.9 % RECTAL OINTMENT    Place 1 application rectally 2 (two) times daily as needed for hemorrhoids.   QUETIAPINE (SEROQUEL) 50 MG TABLET    Take 50 mg by mouth at bedtime.   RIFAXIMIN (XIFAXAN) 550 MG TABS TABLET    Take 550 mg by mouth 2 (two) times daily.   SHARK LIVER OIL-COCOA BUTTER (PREPARATION H) 0.25-3-85.5 % SUPPOSITORY    Place 1 suppository rectally every 6 (six) hours as needed for hemorrhoids.   SPIRONOLACTONE  (ALDACTONE) 50 MG TABLET    Take 50 mg by mouth 2 (two) times daily.   TAMSULOSIN (FLOMAX) 0.4 MG CAPS CAPSULE    Take 0.4 mg by mouth daily. Must be given 30 minutes after same meal daily.  Modified Medications   No medications on file  Discontinued Medications   No medications on file    Review of Systems  Unable to perform ROS: Psychiatric disorder    Filed Vitals:   04/29/16 1018  BP: 172/38 repeat BP 93/36  Pulse: 82 repeat pulse 77  Temp: 99 F (37.2 C)  TempSrc: Oral  Resp: 20  Height: 5\' 6"  (1.676 m)  Weight: 141 lb (63.957 kg)  Pulse ox 98%  Body mass index is 22.77 kg/(m^2).  Physical Exam  Constitutional: He appears well-developed.  Frail appearing in NAD, lying in bed  Musculoskeletal: He exhibits edema and tenderness.  Lymphadenopathy:    He has no cervical adenopathy.  Neurological: He is alert.  Skin: No rash noted.  Psychiatric: He has a normal mood and affect. His behavior is normal.     Labs reviewed: Nursing Home on 04/19/2016  Component Date Value Ref Range Status  . Hemoglobin 04/14/2016 10.0* 13.5 - 17.5 g/dL Final  . HCT 16/10/960405/02/2016 29* 41 - 53 % Final  . Platelets 04/14/2016 59* 150 - 399 K/L Final  . WBC 04/14/2016 2.4   Final  . Glucose 04/14/2016 240   Final  . BUN 04/14/2016 34* 4 - 21 mg/dL Final  . Creatinine 54/09/811905/02/2016 1.3  0.6 - 1.3 mg/dL Final  . Potassium 14/78/295605/02/2016 4.6  3.4 - 5.3 mmol/L Final  . Sodium 04/14/2016 138  137 - 147 mmol/L Final  . Alkaline Phosphatase 04/14/2016 103  25 - 125 U/L Final  . ALT 04/14/2016 17  10 - 40 U/L Final  . AST 04/14/2016 24  14 - 40 U/L Final  . Bilirubin, Total 04/14/2016 0.8   Final  Nursing Home on 02/27/2016  Component Date Value Ref Range Status  . Glucose 02/07/2016 304   Final  . BUN 02/07/2016 25* 4 - 21 mg/dL Final  . Creatinine 21/30/865702/25/2017 1.2  0.6 - 1.3 mg/dL Final  . Potassium 84/69/629502/25/2017 4.8  3.4 - 5.3 mmol/L Final  . Sodium 02/07/2016 131* 137 - 147 mmol/L Final    No  results found.   Assessment/Plan   ICD-9-CM ICD-10-CM   1. Chronic pain syndrome 338.4 G89.4   2. Encephalopathy, hepatic (HCC) 572.2 K72.90   3. Psychosis, unspecified psychosis type 298.9 F29   4. Depression with anxiety 300.4 F41.8   5. Alcoholic cirrhosis of liver without ascites (HCC) 571.2 K70.30   6. Unstageable pressure ulcer of heel, unspecified laterality (HCC) 707.07 L89.600    707.25    7. Counseling and coordination of care V65.49 Z71.89    D/c oxycodone IR due to side effects of sedation and change in MS  Continue prn dilaudid and hold for sedation or respiratory depression  Cont other meds as ordered  Fall precautions  Utilize nonpharmacologic means for pain control as much as possible  Cont PT/OT as ordered  Palliative care consult for goals of care. Hospice not requested at this time  Will follow  TIME SPENT: FACE-FACE 30 MINUTES WITH >50% OF TIME COORDINATING CARE  [place] S. Ancil Linseyarter, D. O., F. A. C. O. I.  [facility] [address] [place], [place] [address] [phone] Cell (Monday-Friday 8 AM - 5 PM) [phone] After 5 PM and follow prompts

## 2016-04-29 NOTE — Progress Notes (Signed)
ANDRIC, KERCE ([id number]) Visit Report for 04/27/2016 Chief Complaint Document Details Patient Name: [name], [name] Date of Service: 04/27/2016 9:30 AM Medical Record Patient Account Number: [id number] Number: Treating RN: Phillis Haggis [birth year] (70 y.o. Other Clinician: Date of Birth/Sex: Male) Treating ROBSON, Bryam Primary Care Physician: Montez Morita, MONICA Physician/Extender: G Referring Physician: Tania Ade in Treatment: 0 Information Obtained from: Patient Chief Complaint Patient is here for our review of bilateral heel ulcers for the last 5-6 months Electronic Signature(s) Signed: 04/28/2016 7:56:10 AM By: Baltazar Najjar MD Entered By: Baltazar Najjar on 04/27/2016 11:39:01 Raucci, Jadarian L. ([id number]) -------------------------------------------------------------------------------- HPI Details Patient Name: [name], [name] Date of Service: 04/27/2016 9:30 AM Medical Record Patient Account Number: [id number] Number: Treating RN: Phillis Haggis [birth year] (70 y.o. Other Clinician: Date of Birth/Sex: Male) Treating ROBSON, Zavion Primary Care Physician: Montez Morita, MONICA Physician/Extender: G Referring Physician: Tania Ade in Treatment: 0 History of Present Illness HPI Description: 04/27/16; this is a 70 year old man who is a resident of [facility] in [place]. He is accompanied by a family friend today. She tells me that he has had problems with pressure ulcers since December 2016 largely at a time where he apparently had diabetic hypoglycemic episodes. Apparently these became so severe that he had to be transferred to [facility] for operative debridement apparently bilaterally. I don't have any records from [facility] but per his friend they did not do imaging studies bone cultures arterial studies etc. Apparently the area just applying wet to dry dressings to these. The patient is a diabetic on insulin. His arterial studies here  were noncompressible although his pulses could be dopplered. 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 At one point I note in his notes he was on hospice I'm not really sure whether that is true currently or not. Electronic Signature(s) Signed: 04/28/2016 7:56:10 AM By: Baltazar Najjar MD Entered By: Baltazar Najjar on 04/27/2016 11:46:50 Alexa, Marolyn Hammock ([id number]) -------------------------------------------------------------------------------- Physical Exam Details Patient Name: [name], [name] Date of Service: 04/27/2016 9:30 AM Medical Record Patient Account Number: [id number] Number: Treating RN: Phillis Haggis Dec 04, 1946 (70 y.o. Other Clinician: Date of Birth/Sex: Male) Treating ROBSON, Kiyon Primary Care Physician: Montez Morita, MONICA Physician/Extender: G Referring Physician: Tania Ade in Treatment: 0 Constitutional Sitting or standing Blood Pressure is within target range for patient.. Pulse regular and within target range for patient.Marland Kitchen Respirations regular, non-labored and within target range.. Temperature is normal and within the target range for the patient.. Patient obviously has ongoing encephalopathy. Initially greeted me but then could not really respond to questions. 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 Eyes Conjunctivae have some redness. Mild discharge noted. No scleral icterus. Respiratory Respiratory effort is easy and symmetric bilaterally. Rate is normal at rest and on room air.. Bilateral breath sounds are clear and equal in all lobes with no wheezes, rales or rhonchi.. Cardiovascular Harsh 4/6 systolic murmur radiating into the right carotid. 3/6 pansystolic murmur over the PMI. He appears to be euvolemic. Femoral and popliteal pulses are palpable.  Pedal pulses absent bilaterally. I could not feel posterior tibial pulses. Gastrointestinal (GI) Abdomen is soft and non-distended without masses or tenderness. Bowel sounds active in all quadrants.. No liver or spleen enlargement or tenderness.. Musculoskeletal Patient is nonambulatory. Psychiatric Patient has ongoing encephalopathy which is most likely hepatic encephalopathy on a chronic basis judging by the  fact that he is on lactulose and Xifaxan.Marland Kitchen. Notes Wound exam; the patient has substantial wounds on both heels right greater than left the. The area on the right has a small surface of exposed bone. Both wounds have healthy granulation no evidence of surrounding infection is seen, the wound on the left there is less wound volume than the right there is no exposed bone here only healthy granulation. The surface on the right heel has more necrotic tissue on it than the left although I did not attempt to debridethis because of the pain level and ongoing delirium Electronic Signature(s) Signed: 04/28/2016 7:56:10 AM By: Baltazar Najjarobson, Celvin MD Entered By: Baltazar Najjarobson, Shermaine on 04/27/2016 11:57:25 Loberg, [name] ([id number]) -------------------------------------------------------------------------------- Physician Orders Details Patient Name: [name], [name] Date of Service: 04/27/2016 9:30 AM Medical Record Patient Account Number: [id number] Number: Treating RN: Phillis Haggisinkerton, Debi 1946/01/24 (70 y.o. Other Clinician: Date of Birth/Sex: Male) Treating ROBSON, Adit Primary Care Physician: Montez MoritaARTER, MONICA Physician/Extender: G Referring Physician: Tania AdeWeeks in Treatment: 0 Verbal / Phone Orders: Yes ClinicianAshok Cordia: Pinkerton, Debi Read Back and Verified: Yes Diagnosis Coding ICD-10 Coding Code Description E11.621 Type 2 diabetes mellitus with foot ulcer E11.42 Type 2 diabetes mellitus with diabetic polyneuropathy Wound Cleansing Wound #1 Left Calcaneus o Clean wound with Normal  Saline. Wound #2 Right,Lateral Calcaneus o Clean wound with Normal Saline. Anesthetic Wound #1 Left Calcaneus o Topical Lidocaine 4% cream applied to wound bed prior to debridement - for clinic use only Wound #2 Right,Lateral Calcaneus o Topical Lidocaine 4% cream applied to wound bed prior to debridement - for clinic use only Skin Barriers/Peri-Wound Care Wound #1 Left Calcaneus o Barrier cream Wound #2 Right,Lateral Calcaneus o Barrier cream Primary Wound Dressing Wound #1 Left Calcaneus o Aquacel Ag Wound #2 Right,Lateral Calcaneus o Aquacel Ag Secondary Dressing Brackeen, Royal L. ([id number]) Wound #1 Left Calcaneus o ABD pad o Dry Gauze o Conform/Kerlix o Foam Wound #2 Right,Lateral Calcaneus o ABD pad o Dry Gauze o Conform/Kerlix o Foam Dressing Change Frequency Wound #1 Left Calcaneus o Change dressing every day. Wound #2 Right,Lateral Calcaneus o Change dressing every day. Follow-up Appointments Wound #1 Left Calcaneus o Return Appointment in 1 week. Wound #2 Right,Lateral Calcaneus o Return Appointment in 1 week. Off-Loading Wound #1 Left Calcaneus o Heel suspension boot to: - please float the heels when pt is sitting with feet up or when pt is lying in bed. o Turn and reposition every 2 hours Wound #2 Right,Lateral Calcaneus o Heel suspension boot to: - please float the heels when pt is sitting with feet up or when pt is lying in bed. o Turn and reposition every 2 hours Additional Orders / Instructions Wound #1 Left Calcaneus o Increase protein intake. Wound #2 Right,Lateral Calcaneus o Increase protein intake. Medications-please add to medication list. Wound #1 Left Calcaneus o Other: - Vitamin C, Zinc, Multivitamin Mehl, Yaw L. ([id number]) Wound #2 Right,Lateral Calcaneus o Other: - Vitamin C, Zinc, Multivitamin Radiology o X-ray, heel - right heel---[facility] sure pt gets and x-ray before he comes back to next visit Electronic Signature(s) Signed: 04/28/2016 7:56:10 AM By: Baltazar Najjarobson, Yoshiharu MD Signed: 04/28/2016 5:19:35 PM By: Alejandro MullingPinkerton, Debra Entered By: Alejandro MullingPinkerton, Debra on 04/27/2016 11:15:19 Whitehorn, Caliber L. ([id number]) -------------------------------------------------------------------------------- Problem List Details Patient Name: [name], [name] Date of Service: 04/27/2016 9:30 AM Medical Record Patient Account Number: [id number] Number: Treating RN: Phillis Haggisinkerton, Debi 1946/01/24 (70 y.o. 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 ([id number]) -------------------------------------------------------------------------------- Progress Note Details Patient Name: Markarian, Dixie L. Date of Service: 04/27/2016 9:30 AM Medical Record Patient Account Number: [id number] Number: Treating RN: Phillis Haggis 12-21-1945 (69 y.o. Other Clinician: Date of Birth/Sex: Male) Treating ROBSON, Aristidis Primary Care Physician: Montez Morita, MONICA Physician/Extender: G Referring Physician: Tania Ade in Treatment: 0 Subjective Chief Complaint Information obtained from Patient Patient is here for our review of bilateral  heel ulcers for the last 5-6 months History of Present Illness (HPI) 04/27/16; this is a 70 year old man who is a resident of [facility] in [place]. He is accompanied by a family friend today. She tells me that he has had problems with pressure ulcers since December 2016 largely at a time where he apparently had diabetic hypoglycemic episodes. Apparently these became so severe that he had to be transferred to [facility] for operative debridement apparently bilaterally. I don't have any records from [facility] but per his friend they did not do imaging studies bone cultures arterial studies etc. Apparently the area just applying wet to dry dressings to these. The patient is a diabetic on insulin. His arterial studies here were noncompressible although his pulses could be dopplered. 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 At one point I note in his notes he was on hospice I'm not really sure whether that is true currently or not. Wound History Patient presents with 2 open wounds that have been present for approximately dec 2016. Patient has been treating wounds in the following manner: saline. Laboratory tests have not been performed in the last month. Patient reportedly has not tested positive for an antibiotic resistant organism. Patient reportedly has not tested positive for osteomyelitis. Patient reportedly has not had testing performed to evaluate circulation in the legs. Patient experiences the following problems associated with their wounds: swelling. Patient History Information obtained from Patient, . Allergies penicillin (Severity: Severe, Reaction: rash) Family History Heart Disease - Father, Stroke - Mother, No family history of Cancer, Diabetes, Hereditary  Spherocytosis, Hypertension, Kidney Disease, Lung Disease, Seizures, Thyroid Problems, Tuberculosis. Zambrana, Kenny L. ([id number]) Social History Never smoker, Marital Status - Divorced, Alcohol Use - Never, Drug Use - No History, Caffeine Use - Never. Medical History Hematologic/Lymphatic Patient has history of Anemia Cardiovascular Patient has history of Hypertension Endocrine Patient has history of Type II Diabetes - a long time pt unable to respond Patient is treated with Insulin. Blood sugar is tested. Review of Systems (ROS) Constitutional Symptoms (General Health) The patient has no complaints or symptoms. Eyes The patient has no complaints or symptoms. Ear/Nose/Mouth/Throat The patient has no complaints or symptoms. Respiratory The patient has no complaints or symptoms. Cardiovascular Complains or has symptoms of Chest pain - hx, hyperlipidemia Gastrointestinal IBS gastritis and gastroduodenitis colon polyps Endocrine hyponautremia liver disease / asian  orange Genitourinary The patient has no complaints or symptoms. Immunological The patient has no complaints or symptoms. Integumentary (Skin) Complains or has symptoms of Wounds. Musculoskeletal Complains or has symptoms of Muscle Weakness, arthropathy Oncologic The patient has no complaints or symptoms. Psychiatric Complains or has symptoms of Anxiety, depression Objective Constitutional Coen, Ovid L. ([id number]) Sitting or standing Blood Pressure is within target range for patient.. Pulse regular and within target range for patient.Marland Kitchen Respirations regular, non-labored and within target range.. Temperature is normal and within the target range for the patient.. Patient obviously has ongoing encephalopathy. Initially greeted me but then could not really respond to questions. 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 Vitals Time Taken: 9:48 AM, Height: 65 in, Source: Stated, Pulse: 89 bpm, Respiratory  Rate: 16 breaths/min, Blood Pressure: 122/69 mmHg. Eyes Conjunctivae have some redness. Mild discharge noted. No scleral icterus. Respiratory Respiratory effort is easy and symmetric bilaterally. Rate is normal at rest and on room air.. Bilateral breath sounds are clear and equal in all lobes with no wheezes, rales or rhonchi.. Cardiovascular Harsh 4/6 systolic murmur radiating into the right carotid. 3/6 pansystolic murmur over the PMI. He appears to be euvolemic. Femoral and popliteal pulses are palpable. Pedal pulses absent bilaterally. I could not feel posterior tibial pulses. Gastrointestinal (GI) Abdomen is soft and non-distended without masses or tenderness. Bowel sounds active in all quadrants.. No liver or spleen enlargement or tenderness.. Musculoskeletal Patient is nonambulatory. Psychiatric Patient has ongoing encephalopathy which is most likely hepatic encephalopathy on a chronic basis judging by the fact that he is on lactulose and Xifaxan.. General Notes: Wound exam; the patient has substantial wounds on both heels right greater than left the. The area on the right has a small surface of exposed bone. Both wounds have healthy granulation no evidence of surrounding infection is seen, the wound on the left there is less wound volume than the right there is no exposed bone here only healthy granulation Integumentary (Hair, Skin) Wound #1 status is Open. Original cause of wound was Pressure Injury. The wound is located on the Left Calcaneus. The wound measures 2.3cm length x 3.3cm width x 0.2cm depth; 5.961cm^2 area and 1.192cm^3 volume. The wound is limited to skin breakdown. There is no tunneling or undermining noted. There is a large amount of serous drainage noted. The wound margin is thickened. There is medium (34- 66%) red granulation within the wound bed. There is a medium (34-66%) amount of necrotic tissue within the wound bed including Adherent Slough. The periwound  skin appearance exhibited: Moist. Periwound temperature was noted as No Abnormality. The periwound has tenderness on palpation. Wound #2 status is Open. Original cause of wound was Pressure Injury. The wound is located on the Right,Lateral Calcaneus. The wound measures 2.9cm length x 3.9cm width x 0.5cm depth; 8.883cm^2 area and 4.441cm^3 volume. There is bone, muscle, and tendon exposed. There is no tunneling or undermining noted. There is a large amount of serosanguineous drainage noted. The wound margin is thickened. There Searson, Ryota L. ([id number]) is medium (34-66%) red granulation within the wound bed. There is a medium (34-66%) amount of necrotic tissue within the wound bed including Eschar. The periwound skin appearance exhibited: Localized Edema, Moist, Erythema. The surrounding wound skin color is noted with erythema which is circumferential. Periwound temperature was noted as No Abnormality. The periwound has tenderness on palpation. Assessment Active Problems ICD-10 E11.621 - Type 2 diabetes mellitus with foot ulcer E11.42 - Type 2 diabetes mellitus with diabetic  polyneuropathy L89.614 - Pressure ulcer of right heel, stage 4 L89.623 - Pressure ulcer of left heel, stage 3 I35.0 - Nonrheumatic aortic (valve) stenosis Plan Wound Cleansing: Wound #1 Left Calcaneus: Clean wound with Normal Saline. Wound #2 Right,Lateral Calcaneus: Clean wound with Normal Saline. Anesthetic: Wound #1 Left Calcaneus: Topical Lidocaine 4% cream applied to wound bed prior to debridement - for clinic use only Wound #2 Right,Lateral Calcaneus: Topical Lidocaine 4% cream applied to wound bed prior to debridement - for clinic use only Skin Barriers/Peri-Wound Care: Wound #1 Left Calcaneus: Barrier cream Wound #2 Right,Lateral Calcaneus: Barrier cream Primary Wound Dressing: Wound #1 Left Calcaneus: Aquacel Ag Wound #2 Right,Lateral Calcaneus: Aquacel Ag Secondary Dressing: Wound #1 Left  Calcaneus: ABD pad Dry Gauze Walder, Jonty L. ([id number]) Conform/Kerlix Foam Wound #2 Right,Lateral Calcaneus: ABD pad Dry Gauze Conform/Kerlix Foam Dressing Change Frequency: Wound #1 Left Calcaneus: Change dressing every day. Wound #2 Right,Lateral Calcaneus: Change dressing every day. Follow-up Appointments: Wound #1 Left Calcaneus: Return Appointment in 1 week. Wound #2 Right,Lateral Calcaneus: Return Appointment in 1 week. Off-Loading: Wound #1 Left Calcaneus: Heel suspension boot to: - please float the heels when pt is sitting with feet up or when pt is lying in bed. Turn and reposition every 2 hours Wound #2 Right,Lateral Calcaneus: Heel suspension boot to: - please float the heels when pt is sitting with feet up or when pt is lying in bed. Turn and reposition every 2 hours Additional Orders / Instructions: Wound #1 Left Calcaneus: Increase protein intake. Wound #2 Right,Lateral Calcaneus: Increase protein intake. Medications-please add to medication list.: Wound #1 Left Calcaneus: Other: - Vitamin C, Zinc, Multivitamin Wound #2 Right,Lateral Calcaneus: Other: - Vitamin C, Zinc, Multivitamin Radiology ordered were: X-ray, heel - right heel---if pt has not had an x-ray at [facility] please amke sure pt gets and x-ray before he comes back to next visit #1 I will see if there is any information on this patient's stay at [facility], specifically did he have bone cultures done of the right heel, imaging studies of the heel and/or vascular studies on either side. If not it should be possible to do an x-ray of the right heel specifically in the facility and arterial studies at least on the right. #2 we will use Aquacel Ag to both wound areas foam and Kerlix this can be changed every second day. Obviously pressure relief will be important. #3 given the patient's list of extensive comorbidities, I am not certain that he is going to be able to heal  the Heide, Andon L. ([id number]) wound especially on the right heel. I think I could obtain a specimen of bone for culture although this would be difficult because of the pain and confusion level. At one point he was on hospice although I didn't pick up that that is still in place. #4 I will see them next week, if plain x-rays and arterial studies are not available I've asked them to do this in the facility Electronic Signature(s) Signed: 04/28/2016 7:56:10 AM By: Baltazar Najjar MD Entered By: Baltazar Najjar on 04/27/2016 11:57:46 Poudrier, Karsin L. ([id number]) -------------------------------------------------------------------------------- ROS/PFSH Details Patient Name: [name], [name] Date of Service: 04/27/2016 9:30 AM Medical Record Patient Account Number: [id number] Number: Treating RN: Phillis Haggis 12/06/1946 (69 y.o. Other Clinician: Date of Birth/Sex: Male) Treating ROBSON, Willet Primary Care Physician: Montez Morita, MONICA Physician/Extender: G Referring Physician: Tania Ade in Treatment: 0 Information Obtained From Patient Other: Wound History Do you currently have one or more open  woundso Yes How many open wounds do you currently haveo 2 Approximately how long have you had your woundso dec 2016 How have you been treating your wound(s) until nowo saline Has your wound(s) ever healed and then re-openedo No Have you had any lab work done in the past montho No Have you tested positive for an antibiotic resistant organism (MRSA, VRE)o No Have you tested positive for osteomyelitis (bone infection)o No Have you had any tests for circulation on your legso No Have you had other problems associated with your woundso Swelling Cardiovascular Complaints and Symptoms: Positive for: Chest pain - hx Review of System Notes: hyperlipidemia Medical History: Positive for: Hypertension Integumentary (Skin) Complaints and Symptoms: Positive for:  Wounds Musculoskeletal Complaints and Symptoms: Positive for: Muscle Weakness Review of System Notes: arthropathy Psychiatric Complaints and Symptoms: Positive for: Anxiety [name], [name] ([id number]) Review of System Notes: depression Constitutional Symptoms (General Health) Complaints and Symptoms: No Complaints or Symptoms Eyes Complaints and Symptoms: No Complaints or Symptoms Ear/Nose/Mouth/Throat Complaints and Symptoms: No Complaints or Symptoms Hematologic/Lymphatic Medical History: Positive for: Anemia Respiratory Complaints and Symptoms: No Complaints or Symptoms Gastrointestinal Complaints and Symptoms: Review of System Notes: IBS gastritis and gastroduodenitis colon polyps Endocrine Complaints and Symptoms: Review of System Notes: hyponautremia liver disease / asian orange Medical History: Positive for: Type II Diabetes - a long time pt unable to respond Treated with: Insulin Blood sugar tested every day: Yes Tested : 3 xs a day Genitourinary [name], [name] ([id number]) Complaints and Symptoms: No Complaints or Symptoms Immunological Complaints and Symptoms: No Complaints or Symptoms Oncologic Complaints and Symptoms: No Complaints or Symptoms Family and Social History Cancer: No; Diabetes: No; Heart Disease: Yes - Father; Hereditary Spherocytosis: No; Hypertension: No; Kidney Disease: No; Lung Disease: No; Seizures: No; Stroke: Yes - Mother; Thyroid Problems: No; Tuberculosis: No; Never smoker; Marital Status - Divorced; Alcohol Use: Never; Drug Use: No History; Caffeine Use: Never; Financial Concerns: No; Food, Clothing or Shelter Needs: No; Support System Lacking: No; Transportation Concerns: No; Advanced Directives: Yes (Not Provided); Patient does not want information on Advanced Directives; Do not resuscitate: Yes (Not Provided); Living Will: Yes (Not Provided); Medical Power of Attorney: Yes - [name] (Not Provided) Electronic  Signature(s) Signed: 04/28/2016 7:56:10 AM By: Baltazar Najjar MD Signed: 04/28/2016 5:19:35 PM By: Alejandro Mulling Entered By: Alejandro Mulling on 04/27/2016 10:04:16 Castor, Grantham L. ([id number]) -------------------------------------------------------------------------------- SuperBill Details Patient Name: [name], [name] Date of Service: 04/27/2016 Medical Record Patient Account Number: [id number] Number: Treating RN: Phillis Haggis 09-17-1946 (69 y.o. Other Clinician: Date of Birth/Sex: Male) Treating ROBSON, Yarnell Primary Care Physician: Montez Morita, MONICA Physician/Extender: G Referring Physician: Tania Ade in Treatment: 0 Diagnosis Coding ICD-10 Codes Code Description E11.621 Type 2 diabetes mellitus with foot ulcer E11.42 Type 2 diabetes mellitus with diabetic polyneuropathy Facility Procedures CPT4 Code: 08657846 Description: 99214 - WOUND CARE VISIT-LEV 4 EST PT Modifier: Quantity: 1 Physician Procedures CPT4 Code: 9629528 Description: 99204 - WC PHYS LEVEL 4 - NEW PT ICD-10 Description Diagnosis E11.621 Type 2 diabetes mellitus with foot ulcer Modifier: Quantity: 1 Electronic Signature(s) Signed: 04/28/2016 7:56:10 AM By: Baltazar Najjar MD Entered By: Baltazar Najjar on 04/27/2016 11:58:16

## 2016-04-29 NOTE — Progress Notes (Signed)
[name] ([id number]) Visit Report for 04/27/2016 Allergy List Details Patient Name: [name], [name] Date of Service: 04/27/2016 9:30 AM Medical Record Number: [id number] Patient Account Number: [id number] Date of Birth/Sex: [birth year] (70 y.o. Male) Treating RN: [name] Primary Care Physician: [name] Other Clinician: Referring Physician: Treating Physician/Extender: [name] in Treatment: 0 Allergies Active Allergies penicillin Reaction: rash Severity: Severe Allergy Notes Electronic Signature(s) Signed: 04/28/2016 5:19:35 PM By: [name] Entered By: [name] on 04/27/2016 09:50:35 [name], [name] ([id number]) -------------------------------------------------------------------------------- Arrival Information Details Patient Name: [name], [name] Date of Service: 04/27/2016 9:30 AM Medical Record Number: [id number] Patient Account Number: [id number] Date of Birth/Sex: [birth year] (70 y.o. Male) Treating RN: [name] Primary Care Physician: [name] Other Clinician: Referring Physician: Treating Physician/Extender: Altamese [place] in Treatment: 0 Visit Information Patient Arrived: Wheel Chair Arrival Time: 09:42 Accompanied By: friend Transfer Assistance: None Patient Identification Verified: Yes Secondary Verification Process Yes Completed: Patient Requires Transmission- No Based Precautions: Patient Has Alerts: Yes Patient Alerts: DM II bil legs non- compressible Electronic Signature(s) Signed: 04/28/2016 5:19:35 PM By: [name] Entered By: [name] on 04/27/2016 10:20:15 [name], [name] ([id number]) -------------------------------------------------------------------------------- Clinic Level of Care Assessment Details Patient Name: [name], [name] Date of Service: 04/27/2016 9:30 AM Medical Record Number: [id number] Patient Account Number: [id number] Date of Birth/Sex:  17-Apr-1946 (70 y.o. Male) Treating RN: [name] Primary Care Physician: [name] Other Clinician: Referring Physician: Treating Physician/Extender: Altamese [place] in Treatment: 0 Clinic Level of Care Assessment Items TOOL 2 Quantity Score X - Use when only an EandM is performed on the INITIAL visit 1 0 ASSESSMENTS - Nursing Assessment / Reassessment []  - General Physical Exam (combine w/ comprehensive assessment (listed just 0 below) when performed on new pt. evals) []  - Comprehensive Assessment (HX, ROS, Risk Assessments, Wounds Hx, etc.) 0 ASSESSMENTS - Wound and Skin Assessment / Reassessment []  - Simple Wound Assessment / Reassessment - one wound 0 X - Complex Wound Assessment / Reassessment - multiple wounds 2 5 []  - Dermatologic / Skin Assessment (not related to wound area) 0 ASSESSMENTS - Ostomy and/or Continence Assessment and Care []  - Incontinence Assessment and Management 0 []  - Ostomy Care Assessment and Management (repouching, etc.) 0 PROCESS - Coordination of Care []  - Simple Patient / Family Education for ongoing care 0 X - Complex (extensive) Patient / Family Education for ongoing care 1 20 X - Staff obtains Chiropractor, Records, Test Results / Process Orders 1 10 X - Staff telephones HHA, Nursing Homes / Clarify orders / etc 1 10 []  - Routine Transfer to another Facility (non-emergent condition) 0 []  - Routine Hospital Admission (non-emergent condition) 0 []  - New Admissions / Manufacturing engineer / Ordering NPWT, Apligraf, etc. 0 X - Emergency Hospital Admission (emergent condition) 1 20 X - Simple Discharge Coordination 1 10 [name], [name] ([id number]) []  - Complex (extensive) Discharge Coordination 0 PROCESS - Special Needs []  - Pediatric / Minor Patient Management 0 []  - Isolation Patient Management 0 []  - Hearing / Language / Visual special needs 0 []  - Assessment of Community assistance (transportation, D/C planning, etc.) 0 []  -  Additional assistance / Altered mentation 0 []  - Support Surface(s) Assessment (bed, cushion, seat, etc.) 0 INTERVENTIONS - Wound Cleansing / Measurement X - Wound Imaging (photographs - any number of wounds) 1 5 []  - Wound Tracing (instead of photographs) 0 []  - Simple Wound Measurement - one wound 0 X - Complex Wound  Measurement - multiple wounds 2 5 []  - Simple Wound Cleansing - one wound 0 X - Complex Wound Cleansing - multiple wounds 2 5 INTERVENTIONS - Wound Dressings []  - Small Wound Dressing one or multiple wounds 0 X - Medium Wound Dressing one or multiple wounds 2 15 []  - Large Wound Dressing one or multiple wounds 0 []  - Application of Medications - injection 0 INTERVENTIONS - Miscellaneous []  - External ear exam 0 []  - Specimen Collection (cultures, biopsies, blood, body fluids, etc.) 0 []  - Specimen(s) / Culture(s) sent or taken to Lab for analysis 0 []  - Patient Transfer (multiple staff / Nurse, adult / Similar devices) 0 []  - Simple Staple / Suture removal (25 or less) 0 []  - Complex Staple / Suture removal (26 or more) 0 [name], [name] ([id number]) []  - Hypo / Hyperglycemic Management (close monitor of Blood Glucose) 0 X - Ankle / Brachial Index (ABI) - do not check if billed separately 1 15 Has the patient been seen at the hospital within the last three years: Yes Total Score: 150 Level Of Care: New/Established - Level 4 Electronic Signature(s) Signed: 04/28/2016 5:19:35 PM By: [name] Entered By: [name] on 04/27/2016 11:23:31 [name], [name] ([id number]) -------------------------------------------------------------------------------- Encounter Discharge Information Details Patient Name: [name], [name] Date of Service: 04/27/2016 9:30 AM Medical Record Number: [id number] Patient Account Number: [id number] Date of Birth/Sex: 15-Nov-1946 (70 y.o. Male) Treating RN: [name] Primary Care Physician: [name] Other  Clinician: Referring Physician: Treating Physician/Extender: Altamese [place] in Treatment: 0 Encounter Discharge Information Items Discharge Pain Level: 0 Discharge Condition: Stable Ambulatory Status: Wheelchair Discharge Destination: Nursing Home Transportation: Other Accompanied By: friend Schedule Follow-up Appointment: Yes Medication Reconciliation completed and provided to Patient/Care No Reis Pienta: Provided on Clinical Summary of Care: 04/27/2016 Form Type Recipient Paper Patient [place] Electronic Signature(s) Signed: 04/27/2016 11:16:06 AM By: Gwenlyn Perking Entered By: Gwenlyn Perking on 04/27/2016 11:16:06 Adams, Skylar L. ([id number]) -------------------------------------------------------------------------------- Lower Extremity Assessment Details Patient Name: Cardell, Jaquin L. Date of Service: 04/27/2016 9:30 AM Medical Record Number: [id number] Patient Account Number: [id number] Date of Birth/Sex: [birth year] (70 y.o. Male) Treating RN: [name] Primary Care Physician: [name] Other Clinician: Referring Physician: Treating Physician/Extender: [name] in Treatment: 0 Vascular Assessment Pulses: Posterior Tibial Dorsalis Pedis Palpable: [Left:No] [Right:No] Doppler: [Left:Monophasic] [Right:Multiphasic] Extremity colors, hair growth, and conditions: Extremity Color: [Left:Normal] [Right:Normal] Temperature of Extremity: [Left:Warm] [Right:Warm] Capillary Refill: [Left:< 3 seconds] [Right:< 3 seconds] Blood Pressure: Brachial: [Left:122] [Right:122] Toe Nail Assessment Left: Right: Thick: No No Discolored: No No Deformed: No No Improper Length and Hygiene: Yes Yes Notes bil-legs non-compress Electronic Signature(s) Signed: 04/28/2016 5:19:35 PM By: [name] Entered By: [name] on 04/27/2016 10:21:47 Christianson, Delvis L.  ([id number]) -------------------------------------------------------------------------------- Multi Wound Chart Details Patient Name: Schnitzer, Tremond L. Date of Service: 04/27/2016 9:30 AM Medical Record Number: [id number] Patient Account Number: [id number] Date of Birth/Sex: [birth year] (70 y.o. Male) Treating RN: [name] Primary Care Physician: [name] Other Clinician: Referring Physician: Treating Physician/Extender: [name] in Treatment: 0 Vital Signs Height(in): 65 Pulse(bpm): 89 Weight(lbs): Blood Pressure 122/69 (mmHg): Body Mass Index(BMI): Temperature(F): Respiratory Rate 16 (breaths/min): Photos: [1:No Photos] [2:No Photos] [N/A:N/A] Wound Location: [1:Left Calcaneus] [2:Right Calcaneus - Lateral] [N/A:N/A] Wounding Event: [1:Pressure Injury] [2:Pressure Injury] [N/A:N/A] Primary Etiology: [1:Pressure Ulcer] [2:Pressure Ulcer] [N/A:N/A] Comorbid History: [1:Anemia, Hypertension, Type II Diabetes] [2:Anemia, Hypertension, Type II Diabetes] [N/A:N/A] Date Acquired: [1:12/11/2015] [2:12/11/2015] [N/A:N/A] [name] of Treatment: [1:0] [2:0] [N/A:N/A] Wound Status: [  1:Open] [2:Open] [N/A:N/A] Measurements L x W x D 2.3x3.3x0.2 [2:2.9x3.9x0.5] [N/A:N/A] (cm) Area (cm) : [1:5.961] [2:8.883] [N/A:N/A] Volume (cm) : [1:1.192] [2:4.441] [N/A:N/A] Classification: [1:Category/Stage II] [2:Category/Stage III] [N/A:N/A] HBO Classification: [1:Grade 1] [2:Grade 1] [N/A:N/A] Exudate Amount: [1:Large] [2:Large] [N/A:N/A] Exudate Type: [1:Serous] [2:Serosanguineous] [N/A:N/A] Exudate Color: [1:amber] [2:red, brown] [N/A:N/A] Wound Margin: [1:Thickened] [2:Thickened] [N/A:N/A] Granulation Amount: [1:Medium (34-66%)] [2:Medium (34-66%)] [N/A:N/A] Granulation Quality: [1:Red] [2:Red] [N/A:N/A] Necrotic Amount: [1:Medium (34-66%)] [2:Medium (34-66%)] [N/A:N/A] Necrotic Tissue: [1:Adherent Slough] [2:Eschar] [N/A:N/A] Exposed Structures: [1:Fascia: No  Fat: No Tendon: No Muscle: No Joint: No Bone: No] [2:Fascia: No Fat: No Tendon: No Muscle: No Joint: No Bone: No] [N/A:N/A] Limited to Skin Limited to Skin Breakdown Breakdown Epithelialization: None None N/A Periwound Skin Texture: No Abnormalities Noted Edema: Yes N/A Periwound Skin Moist: Yes Moist: Yes N/A Moisture: Periwound Skin Color: No Abnormalities Noted Erythema: Yes N/A Erythema Location: N/A Circumferential N/A Temperature: No Abnormality No Abnormality N/A Tenderness on Yes Yes N/A Palpation: Wound Preparation: Ulcer Cleansing: Ulcer Cleansing: N/A Rinsed/Irrigated with Rinsed/Irrigated with Saline Saline Topical Anesthetic Topical Anesthetic Applied: Other: lidocaine Applied: Other: lidocaine 4% 4% Treatment Notes Electronic Signature(s) Signed: 04/28/2016 5:19:35 PM By: [name] Entered By: [name] on 04/27/2016 10:34:41 Bless, [name] ([id number]) -------------------------------------------------------------------------------- Multi-Disciplinary Care Plan Details Patient Name: Balicki, Kasen L. Date of Service: 04/27/2016 9:30 AM Medical Record Number: [id number] Patient Account Number: [id number] Date of Birth/Sex: [birth year] (70 y.o. Male) Treating RN: [name] Primary Care Physician: [name] Other Clinician: Referring Physician: Treating Physician/Extender: Altamese  in Treatment: 0 Active Inactive Abuse / Safety / Falls / Self Care Management Nursing Diagnoses: Potential for falls Goals: Patient will remain injury free Date Initiated: 04/27/2016 Goal Status: Active Interventions: Assess fall risk on admission and as needed Assess self care needs on admission and as needed Notes: Nutrition Nursing Diagnoses: Imbalanced nutrition Goals: Patient/caregiver agrees to and verbalizes understanding of need to use nutritional supplements and/or vitamins as prescribed Date Initiated: 04/27/2016 Goal Status:  Active Interventions: Assess patient nutrition upon admission and as needed per policy Notes: Orientation to the Wound Care Program Nursing Diagnoses: Knowledge deficit related to the wound healing center program Goals: Miu, Yavier Elbert Ewings ([id number]) Patient/caregiver will verbalize understanding of the Wound Healing Center Program Date Initiated: 04/27/2016 Goal Status: Active Interventions: Provide education on orientation to the wound center Notes: Pain, Acute or Chronic Nursing Diagnoses: Pain, acute or chronic: actual or potential Potential alteration in comfort, pain Goals: Patient will verbalize adequate pain control and receive pain control interventions during procedures as needed Date Initiated: 04/27/2016 Goal Status: Active Patient/caregiver will verbalize adequate pain control between visits Date Initiated: 04/27/2016 Goal Status: Active Interventions: Assess comfort goal upon admission Complete pain assessment as per visit requirements Notes: Pressure Nursing Diagnoses: Knowledge deficit related to causes and risk factors for pressure ulcer development Knowledge deficit related to management of pressures ulcers Goals: Patient will remain free from development of additional pressure ulcers Date Initiated: 04/27/2016 Goal Status: Active Interventions: Assess: immobility, friction, shearing, incontinence upon admission and as needed Assess offloading mechanisms upon admission and as needed Assess potential for pressure ulcer upon admission and as needed Notes: Barnier, Jediah L. ([id number]) Wound/Skin Impairment Nursing Diagnoses: Impaired tissue integrity Goals: Ulcer/skin breakdown will have a volume reduction of 30% by week 4 Date Initiated: 04/27/2016 Goal Status: Active Ulcer/skin breakdown will have a volume reduction of 50% by week 8 Date Initiated: 04/27/2016 Goal Status: Active Ulcer/skin breakdown will have a volume reduction of 80% by week  12 Date  Initiated: 04/27/2016 Goal Status: Active Interventions: Assess patient/caregiver ability to perform ulcer/skin care regimen upon admission and as needed Assess ulceration(s) every visit Notes: Electronic Signature(s) Signed: 04/28/2016 5:19:35 PM By: [name] MullingPinkerton, [name] Entered By: [name] MullingPinkerton, [name] on 04/27/2016 13:55:13 [name], [name] ([id number]) -------------------------------------------------------------------------------- Pain Assessment Details Patient Name: Basara, Starr L. Date of Service: 04/27/2016 9:30 AM Medical Record Number: [id number] Patient Account Number: [id number] Date of Birth/Sex: May 15, 1946 [age] y.o. Male) Treating RN: [name] HaggisPinkerton, Debi Primary Care Physician: [name] MoritaARTER, [name] Other Clinician: Referring Physician: Treating Physician/Extender: [name] CaulOBSON, Thuan G [name] in Treatment: 0 Active Problems Location of Pain Severity and Description of Pain Patient Has Paino Patient Unable to Respond Site Locations Pain Management and Medication Current Pain Management: Electronic Signature(s) Signed: 04/28/2016 5:19:35 PM By: [name] MullingPinkerton, [name] Entered By: [name] MullingPinkerton, [name] on 04/27/2016 09:48:10 [name], [name] ([id number]) -------------------------------------------------------------------------------- Patient/Caregiver Education Details Patient Name: [name], [name] Date of Service: 04/27/2016 9:30 AM Medical Record Number: [id number] Patient Account Number: [id number] Date of Birth/Gender: May 15, 1946 [age] y.o. Male) Treating RN: [name] HaggisPinkerton, Debi Primary Care Physician: [name] MoritaARTER, [name] Other Clinician: Referring Physician: Treating Physician/Extender: Altamese CarolinaOBSON, Piercen G [name] in Treatment: 0 Education Assessment Education Provided To: Patient and Caregiver Education Topics Provided Wound/Skin Impairment: Handouts: Other: change dressing as directed Methods: Demonstration, Explain/Verbal Responses: State content correctly Electronic Signature(s) Signed: 04/28/2016  5:19:35 PM By: [name] MullingPinkerton, [name] Entered By: [name] MullingPinkerton, [name] on 04/27/2016 10:54:06 [name], [name] ([id number]) -------------------------------------------------------------------------------- Wound Assessment Details Patient Name: [name], [name] Date of Service: 04/27/2016 9:30 AM Medical Record Number: [id number] Patient Account Number: [id number] Date of Birth/Sex: May 15, 1946 [age] y.o. Male) Treating RN: [name] HaggisPinkerton, Debi Primary Care Physician: [name] MoritaARTER, [name] Other Clinician: Referring Physician: Treating Physician/Extender: [name] CaulOBSON, Caroline G [name] in Treatment: 0 Wound Status Wound Number: 1 Primary Pressure Ulcer Etiology: Wound Location: Left Calcaneus Wound Status: Open Wounding Event: Pressure Injury Comorbid Anemia, Hypertension, Type II Date Acquired: 12/11/2015 History: Diabetes [name] Of Treatment: 0 Clustered Wound: No Photos Photo Uploaded By: [name] MullingPinkerton, [name] on 04/28/2016 17:13:59 Wound Measurements Length: (cm) 2.3 Width: (cm) 3.3 Depth: (cm) 0.2 Area: (cm) 5.961 Volume: (cm) 1.192 % Reduction in Area: % Reduction in Volume: Epithelialization: None Tunneling: No Undermining: No Wound Description Classification: Category/Stage II Diabetic Severity [address](Wagner): Grade 1 Wound Margin: Thickened Exudate Amount: Large Exudate Type: Serous Exudate Color: amber Foul Odor After Cleansing: No Wound Bed Granulation Amount: Medium (34-66%) Exposed Structure Granulation Quality: Red Fascia Exposed: No Necrotic Amount: Medium (34-66%) Fat Layer Exposed: No Lafortune, Khylan L. ([id number]) Necrotic Quality: Adherent Slough Tendon Exposed: No Muscle Exposed: No Joint Exposed: No Bone Exposed: No Limited to Skin Breakdown Periwound Skin Texture Texture Color No Abnormalities Noted: No No Abnormalities Noted: No Moisture Temperature / Pain No Abnormalities Noted: No Temperature: No Abnormality Moist: Yes Tenderness on Palpation: Yes Wound  Preparation Ulcer Cleansing: Rinsed/Irrigated with Saline Topical Anesthetic Applied: Other: lidocaine 4%, Treatment Notes Wound #1 (Left Calcaneus) 1. Cleansed with: Clean wound with Normal Saline 2. Anesthetic Topical Lidocaine 4% cream to wound bed prior to debridement 3. Peri-wound Care: Barrier cream 4. Dressing Applied: Aquacel Ag 5. Secondary Dressing Applied ABD Pad Dry Gauze Foam Kerlix/Conform 7. Secured with Secretary/administratorTape Electronic Signature(s) Signed: 04/28/2016 5:19:35 PM By: [name] MullingPinkerton, [name] Entered By: [name] MullingPinkerton, [name] on 04/27/2016 10:26:29 [name], Graycen Elbert EwingsL. ([id number]) -------------------------------------------------------------------------------- Wound Assessment Details Patient Name: Rahal, Harald L. Date of Service: 04/27/2016 9:30 AM Medical Record Number: [id number] Patient Account Number: [id number] Date of Birth/Sex: May 15, 1946 [age] y.o. Male) Treating RN: [name] HaggisPinkerton, Debi Primary Care Physician: [name] MoritaARTER, [name] Other  Clinician: Referring Physician: Treating Physician/Extender: [name] in Treatment: 0 Wound Status Wound Number: 2 Primary Pressure Ulcer Etiology: Wound Location: Right Calcaneus - Lateral Wound Status: Open Wounding Event: Pressure Injury Comorbid Anemia, Hypertension, Type II Date Acquired: 12/11/2015 History: Diabetes [name] Of Treatment: 0 Clustered Wound: No Photos Photo Uploaded By: [name] on 04/28/2016 17:13:59 Wound Measurements Length: (cm) 2.9 Width: (cm) 3.9 Depth: (cm) 0.5 Area: (cm) 8.883 Volume: (cm) 4.441 % Reduction in Area: 0% % Reduction in Volume: 0% Epithelialization: None Tunneling: No Undermining: No Wound Description Classification: Category/Stage IV Diabetic Severity [address]): Grade 1 Wound Margin: Thickened Exudate Amount: Large Exudate Type: Serosanguineous Exudate Color: red, brown Foul Odor After Cleansing: No Wound Bed Granulation Amount: Medium (34-66%) Exposed  Structure Granulation Quality: Red Fascia Exposed: No Necrotic Amount: Medium (34-66%) Fat Layer Exposed: No Hoffmeier, Kyrin L. ([id number]) Necrotic Quality: Eschar Tendon Exposed: Yes Muscle Exposed: Yes Necrosis of Muscle: No Joint Exposed: No Bone Exposed: Yes Periwound Skin Texture Texture Color No Abnormalities Noted: No No Abnormalities Noted: No Localized Edema: Yes Erythema: Yes Erythema Location: Circumferential Moisture No Abnormalities Noted: No Temperature / Pain Moist: Yes Temperature: No Abnormality Tenderness on Palpation: Yes Wound Preparation Ulcer Cleansing: Rinsed/Irrigated with Saline Topical Anesthetic Applied: Other: lidocaine 4%, Treatment Notes Wound #2 (Right, Lateral Calcaneus) 1. Cleansed with: Clean wound with Normal Saline 2. Anesthetic Topical Lidocaine 4% cream to wound bed prior to debridement 3. Peri-wound Care: Barrier cream 4. Dressing Applied: Aquacel Ag 5. Secondary Dressing Applied ABD Pad Dry Gauze Foam Kerlix/Conform 7. Secured with Secretary/administrator) Signed: 04/28/2016 5:19:35 PM By: [name] Entered By: [name] on 04/27/2016 10:44:00 Tagliaferro, [name] ([id number]) -------------------------------------------------------------------------------- Vitals Details Patient Name: Barot, Kyros L. Date of Service: 04/27/2016 9:30 AM Medical Record Number: [id number] Patient Account Number: [id number] Date of Birth/Sex: [birth year] (70 y.o. Male) Treating RN: [name] Primary Care Physician: [name] Other Clinician: Referring Physician: Treating Physician/Extender: [name] in Treatment: 0 Vital Signs Time Taken: 09:48 Pulse (bpm): 89 Height (in): 65 Respiratory Rate (breaths/min): 16 Source: Stated Blood Pressure (mmHg): 122/69 Reference Range: 80 - 120 mg / dl Electronic Signature(s) Signed: 04/28/2016 5:19:35 PM By: [name] Entered By: [name] on  04/27/2016 09:50:00

## 2016-04-29 NOTE — Progress Notes (Signed)
[name] NakaiSHORT, Quentin L. ([id number]) Visit Report for 04/27/2016 Abuse/Suicide Risk Screen Details Patient Name: [name], [name] Date of Service: 04/27/2016 9:30 AM Medical Record Patient Account Number: [id number] Number: Treating RN: Phillis Haggisinkerton, Debi 1946/09/02 (70 y.o. Other Clinician: Date of Birth/Sex: Male) Treating ROBSON, Rielly Primary Care Physician: Montez MoritaARTER, MONICA Physician/Extender: G Referring Physician: Tania AdeWeeks in Treatment: 0 Abuse/Suicide Risk Screen Items Answer ABUSE/SUICIDE RISK SCREEN: Has anyone close to you tried to hurt or harm you recentlyo No Do you feel uncomfortable with anyone in your familyo No Has anyone forced you do things that you didnot want to doo No Do you have any thoughts of harming yourselfo No Patient displays signs or symptoms of abuse and/or neglect. No Notes not able to ask pt he cant respond asked family friend Electronic Signature(s) Signed: 04/28/2016 5:19:35 PM By: Alejandro MullingPinkerton, Debra Entered By: Alejandro MullingPinkerton, Debra on 04/27/2016 10:04:43 Whiters, Mahdi L. ([id number]) -------------------------------------------------------------------------------- Activities of Daily Living Details Patient Name: Schmiesing, [name] Date of Service: 04/27/2016 9:30 AM Medical Record Patient Account Number: [id number] Number: Treating RN: Phillis Haggisinkerton, Debi 1946/09/02 (70 y.o. Other Clinician: Date of Birth/Sex: Male) Treating ROBSON, Jarmaine Primary Care Physician: Montez MoritaARTER, MONICA Physician/Extender: G Referring Physician: Tania AdeWeeks in Treatment: 0 Activities of Daily Living Items Answer Activities of Daily Living (Please select one for each item) Drive Automobile Not Able Take Medications Not Able Use Telephone Not Able Care for Appearance Not Able Use Toilet Not Able Bath / Shower Not Able Dress Self Not Able Feed Self Not Able Walk Not Able Get In / Out Bed Not Able Housework Not Able Prepare Meals Not Able Handle Money Not Able Shop  for Self Not Able Electronic Signature(s) Signed: 04/28/2016 5:19:35 PM By: Alejandro MullingPinkerton, Debra Entered By: Alejandro MullingPinkerton, Debra on 04/27/2016 10:05:10 Vandervort, [name] ([id number]) -------------------------------------------------------------------------------- Education Assessment Details Patient Name: Wamboldt, [name] Date of Service: 04/27/2016 9:30 AM Medical Record Patient Account Number: [id number] Number: Treating RN: Phillis Haggisinkerton, Debi 1946/09/02 (70 y.o. Other Clinician: Date of Birth/Sex: Male) Treating ROBSON, Cassady Primary Care Physician: Montez MoritaARTER, MONICA Physician/Extender: G Referring Physician: Tania AdeWeeks in Treatment: 0 Primary Learner Assessed: Caregiver family friend Reason Patient is not Primary Learner: he cant respond Learning Preferences/Education Level/Primary Language Learning Preference: Explanation, Printed Material Highest Education Level: College or Above Preferred Language: Economistnglish Cognitive Barrier Assessment/Beliefs Language Barrier: No Translator Needed: No Memory Deficit: No Emotional Barrier: No Cultural/Religious Beliefs Affecting Medical No Care: Physical Barrier Assessment Impaired Vision: No Impaired Hearing: No Decreased Hand dexterity: No Knowledge/Comprehension Assessment Knowledge Level: High Comprehension Level: High Ability to understand written High instructions: Ability to understand verbal High instructions: Motivation Assessment Anxiety Level: Calm Cooperation: Cooperative Education Importance: Acknowledges Need Interest in Health Problems: Asks Questions Perception: Coherent Willingness to Engage in Self- High Management Activities: High Pyon, Jasiyah L. ([id number]) Readiness to Engage in Self- Management Activities: Electronic Signature(s) Signed: 04/28/2016 5:19:35 PM By: Alejandro MullingPinkerton, Debra Entered By: Alejandro MullingPinkerton, Debra on 04/27/2016 10:05:56 [place]ston, [name]  ([id number]) -------------------------------------------------------------------------------- Fall Risk Assessment Details Patient Name: Galka, [name] Date of Service: 04/27/2016 9:30 AM Medical Record Patient Account Number: [id number] Number: Treating RN: Phillis Haggisinkerton, Debi 1946/09/02 (70 y.o. Other Clinician: Date of Birth/Sex: Male) Treating ROBSON, Mathayus Primary Care Physician: Montez MoritaARTER, MONICA Physician/Extender: G Referring Physician: Tania AdeWeeks in Treatment: 0 Fall Risk Assessment Items Have you had 2 or more falls in the last 12 monthso 0 No Have you had any fall that resulted in injury in the last 12 monthso 0 No FALL RISK ASSESSMENT: History of falling - immediate  or within 3 months 0 No Secondary diagnosis 15 Yes Ambulatory aid None/bed rest/wheelchair/nurse 0 Yes Crutches/cane/walker 0 No Furniture 0 No IV Access/Saline Lock 0 No Gait/Training Normal/bed rest/immobile 0 No Weak 10 Yes Impaired 20 Yes Mental Status Oriented to own ability 0 No Electronic Signature(s) Signed: 04/28/2016 5:19:35 PM By: Alejandro Mulling Entered By: Alejandro Mulling on 04/27/2016 10:06:44 Derrington, Gergory L. ([id number]) -------------------------------------------------------------------------------- Foot Assessment Details Patient Name: Scheerer, [name] Date of Service: 04/27/2016 9:30 AM Medical Record Patient Account Number: [id number] Number: Treating RN: Phillis Haggis February 21, 1946 (70 y.o. Other Clinician: Date of Birth/Sex: Male) Treating ROBSON, Kellin Primary Care Physician: Montez Morita, MONICA Physician/Extender: G Referring Physician: Tania Ade in Treatment: 0 Foot Assessment Items  Unable to perform due to altered mental status Site Locations + = Sensation present, - = Sensation absent, C = Callus, U = Ulcer R = Redness, W = Warmth, M = Maceration, PU = Pre-ulcerative lesion F = Fissure, S = Swelling, D = Dryness Assessment Right: Left: Other Deformity:  No No Prior Foot Ulcer: No No Prior Amputation: No No Charcot Joint: No No Ambulatory Status: Gait: Electronic Signature(s) Signed: 04/28/2016 5:19:35 PM By: Alejandro Mulling Entered By: Alejandro Mulling on 04/27/2016 10:07:17 Jay, Cheron L. ([id number]) Tibbs, Alvis LMarland Kitchen ([id number]) -------------------------------------------------------------------------------- Nutrition Risk Assessment Details Patient Name: Kober, Keilen L. Date of Service: 04/27/2016 9:30 AM Medical Record Patient Account Number: [id number] Number: Treating RN: Phillis Haggis Aug 31, 1946 (70 y.o. Other Clinician: Date of Birth/Sex: Male) Treating ROBSON, Nicholi Primary Care Physician: Montez Morita, MONICA Physician/Extender: G Referring Physician: Tania Ade in Treatment: 0 Height (in): 65 Weight (lbs): Body Mass Index (BMI): Nutrition Risk Assessment Items NUTRITION RISK SCREEN: I have an illness or condition that made me change the kind and/or 2 Yes amount of food I eat I eat fewer than two meals per day 0 No I eat few fruits and vegetables, or milk products 0 No I have three or more drinks of beer, liquor or wine almost every day 0 No I have tooth or mouth problems that make it hard for me to eat 0 No I don't always have enough money to buy the food I need 0 No I eat alone most of the time 0 No I take three or more different prescribed or over-the-counter drugs a 1 Yes day Without wanting to, I have lost or gained 10 pounds in the last six 0 No months I am not always physically able to shop, cook and/or feed myself 0 No Nutrition Protocols Good Risk Protocol Moderate Risk Protocol Electronic Signature(s) Signed: 04/28/2016 5:19:35 PM By: Alejandro Mulling Entered By: Alejandro Mulling on 04/27/2016 10:07:08

## 2016-05-04 ENCOUNTER — Encounter: Payer: PRIVATE HEALTH INSURANCE | Admitting: Internal Medicine

## 2016-05-04 DIAGNOSIS — E11621 Type 2 diabetes mellitus with foot ulcer: Secondary | ICD-10-CM | POA: Diagnosis not present

## 2016-05-05 ENCOUNTER — Other Ambulatory Visit
Admission: RE | Admit: 2016-05-05 | Discharge: 2016-05-05 | Disposition: A | Discharge status: Home / Self Care | Payer: PRIVATE HEALTH INSURANCE | Source: Ambulatory Visit | Attending: Internal Medicine | Admitting: Internal Medicine

## 2016-05-05 DIAGNOSIS — S91301A Unspecified open wound, right foot, initial encounter: Secondary | ICD-10-CM | POA: Insufficient documentation

## 2016-05-05 DIAGNOSIS — X58XXXA Exposure to other specified factors, initial encounter: Secondary | ICD-10-CM | POA: Diagnosis not present

## 2016-05-05 IMAGING — CR DG CHEST 1V
1 series · 1 of 1 positions shown · non-contrast
Comparison: Single view of the chest 01/06/2015. PA and lateral
chest 01/04/2014.

CLINICAL DATA: Status post fall 03/23/2015.  Right hip fracture.

EXAM:
CHEST  1 VIEW

[chest ap]
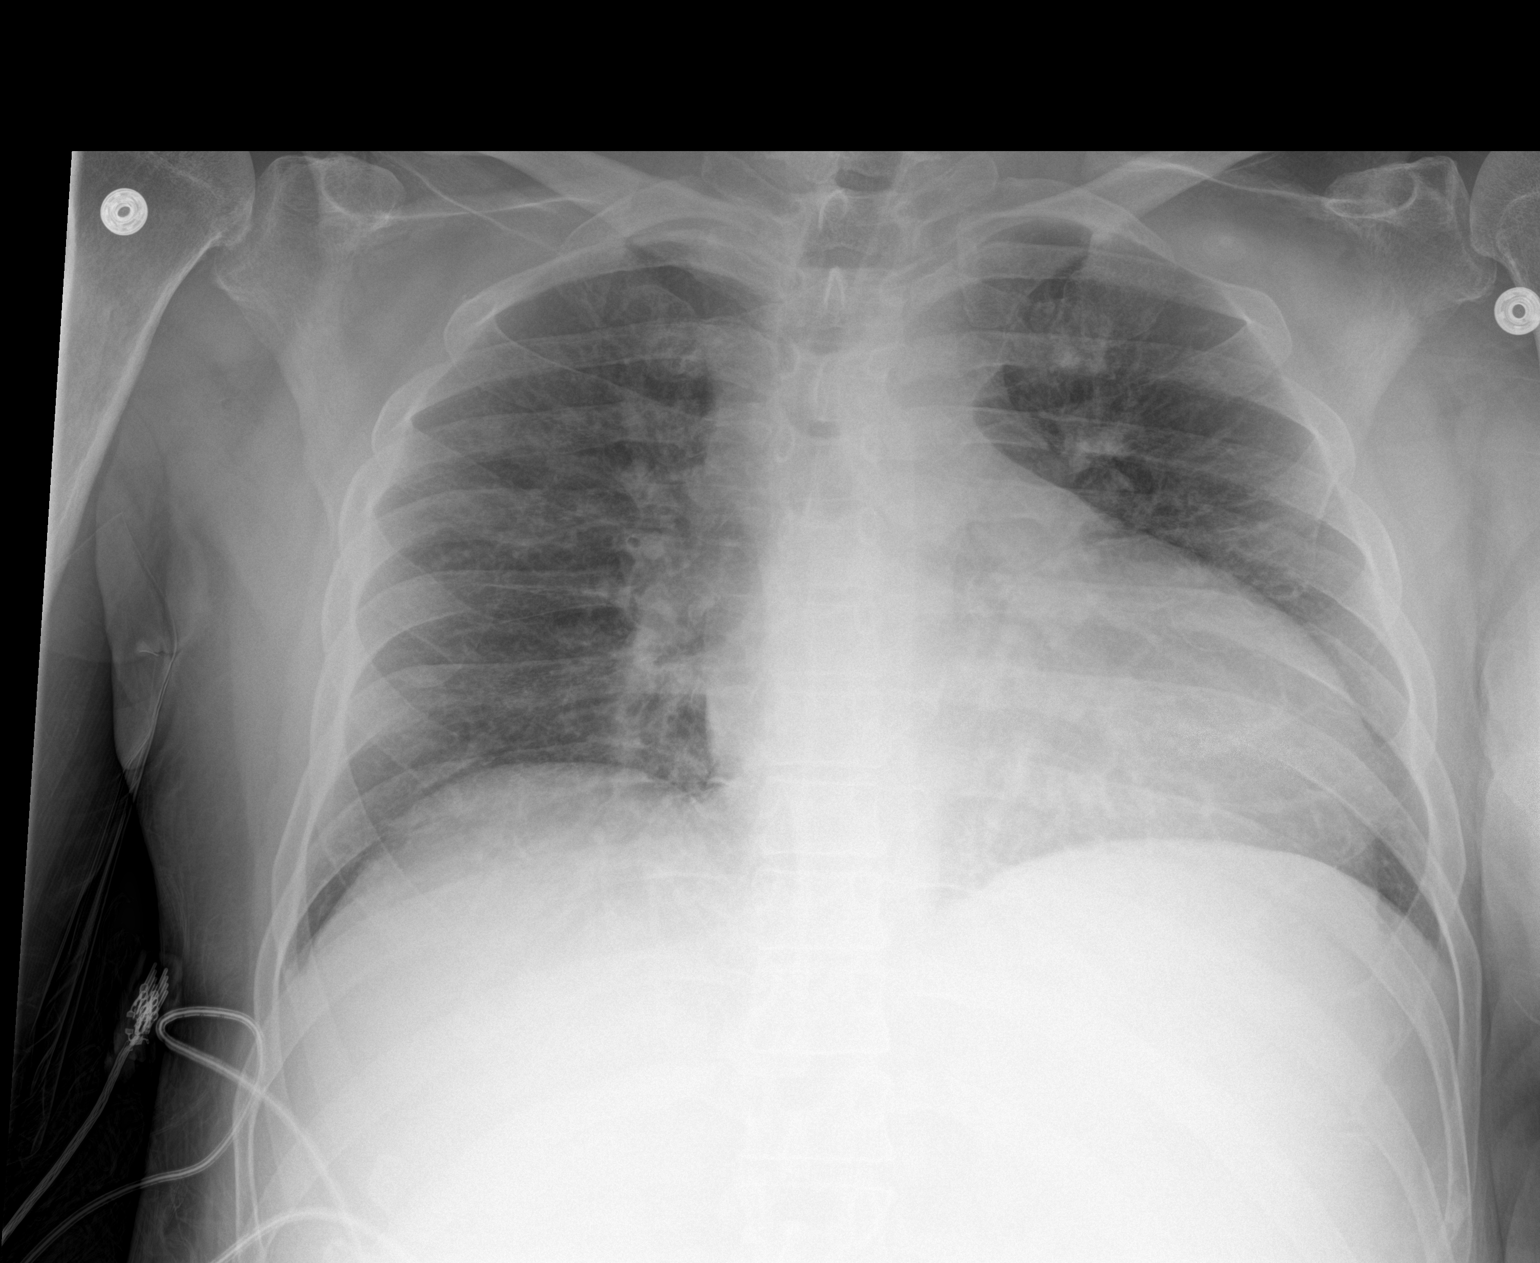

[1 of 1 positions shown; findings below may reference images not displayed]

FINDINGS: Heart size is upper normal. The lungs are clear. No pneumothorax or
pleural effusion.
IMPRESSION: No acute disease.

## 2016-05-05 IMAGING — CR DG HIP (WITH OR WITHOUT PELVIS) 2-3V*R*
4 series · 4 of 4 positions shown · non-contrast
Comparison: Radiograph 01/07/2015

CLINICAL DATA: Fall, right hip pain

EXAM:
RIGHT HIP (WITH PELVIS) 2-3 VIEWS

[pelvis ap]
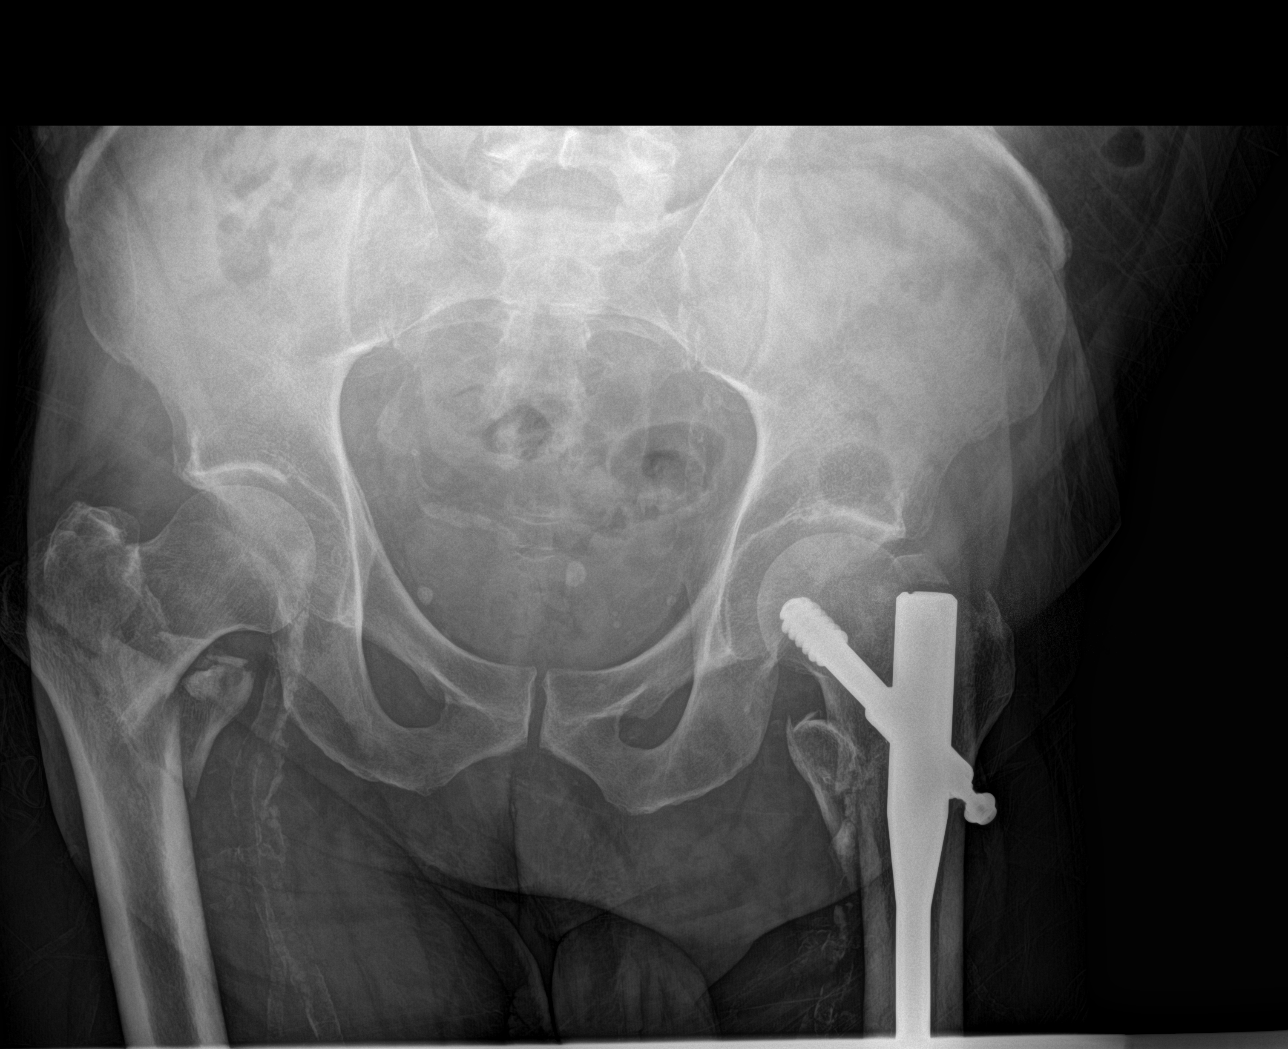

[hip ap]
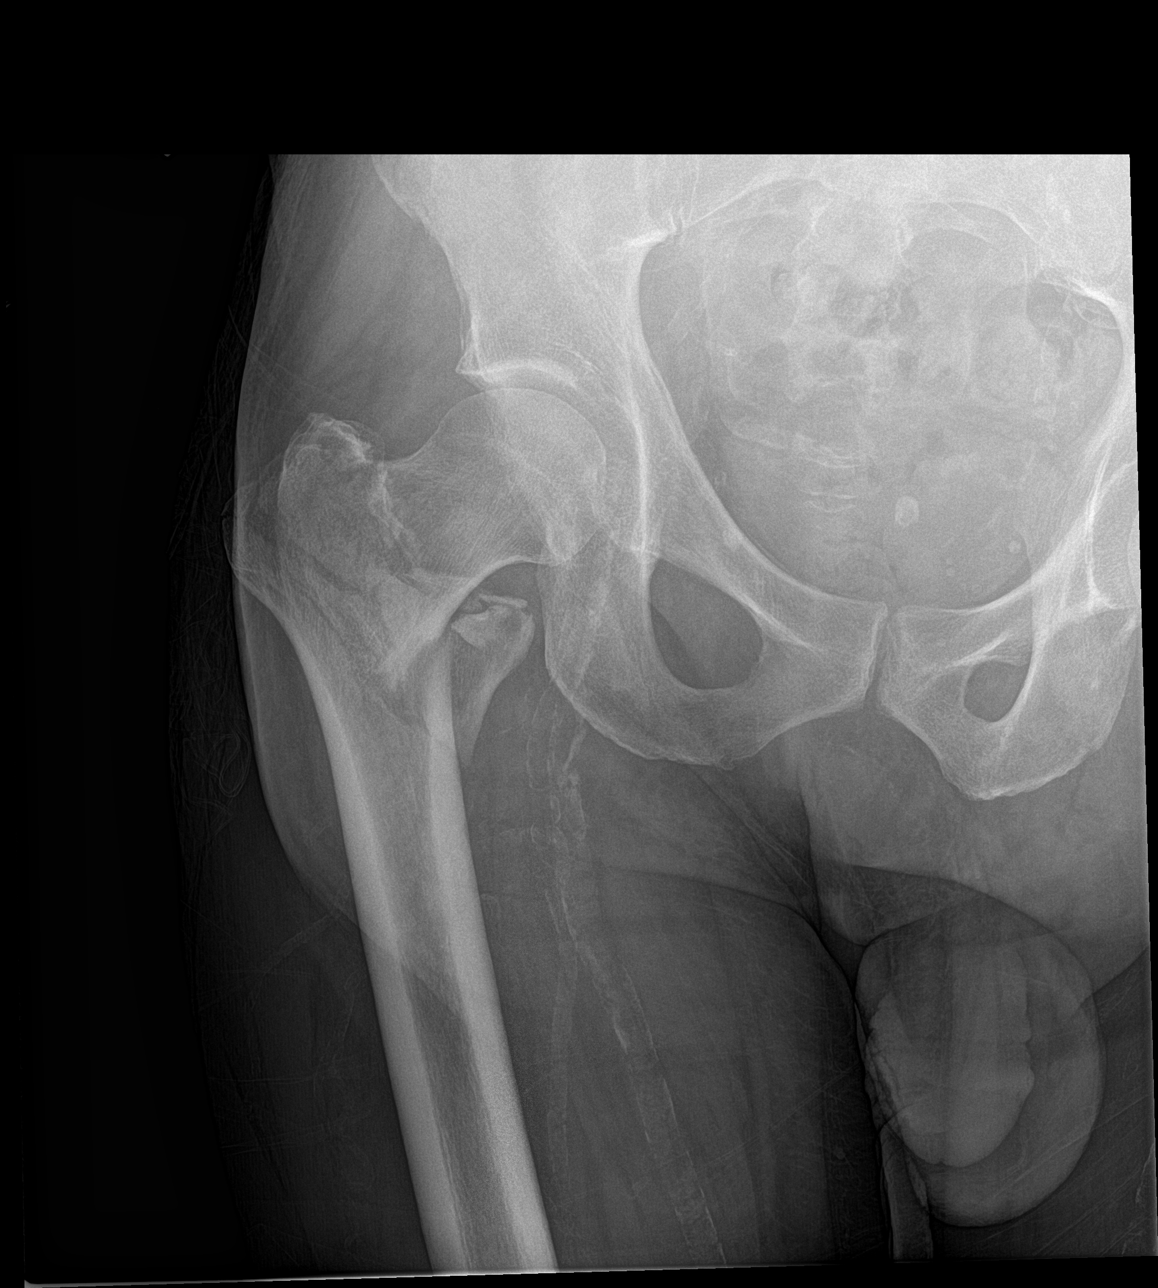

[hip lat (1 of 2)]
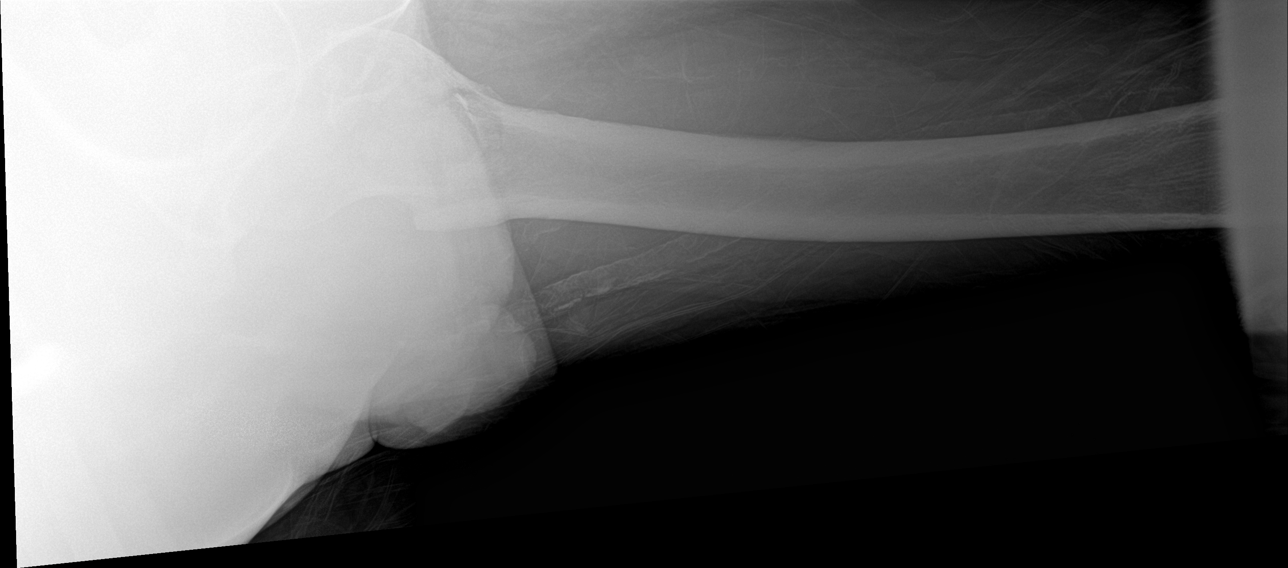

[hip lat (2 of 2)]
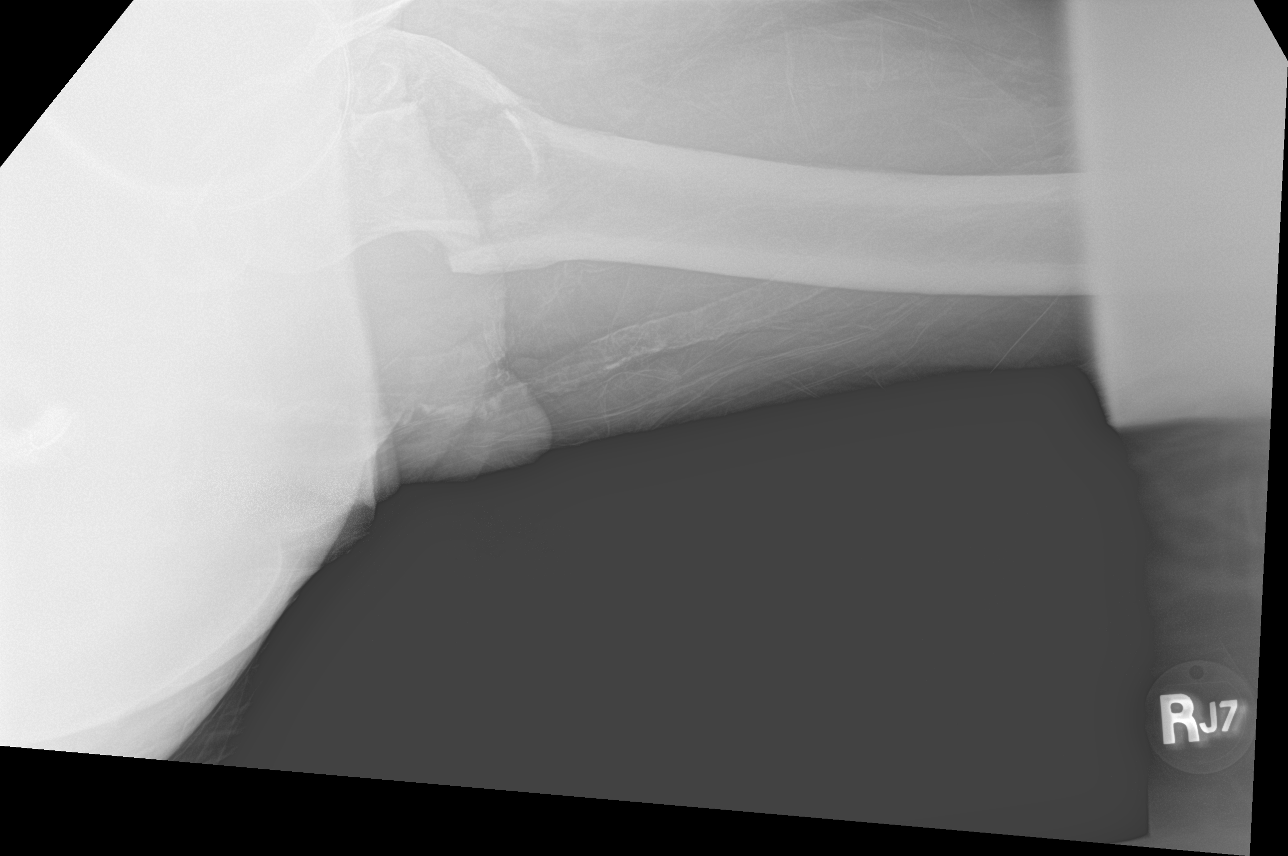

[4 of 4 positions shown; findings below may reference images not displayed]

FINDINGS: Acute intertrochanteric fracture of the proximal right femur. This
avulsion of the lesser trochanter. Mild varus angulation. No
dislocation. Internal fixation of left hip fracture noted
IMPRESSION: Acute right intertrochanteric femur fracture.

## 2016-05-05 NOTE — Progress Notes (Signed)
NAVARRE, DIANA ([id number]) Visit Report for 05/04/2016 Chief Complaint Document Details Patient Name: Blankenbeckler, Aiden L. Date of Service: 05/04/2016 12:45 PM Medical Record Patient Account Number: [id number] Number: Treating RN: Phillis Haggis 1946-12-01 (70 y.o. Other Clinician: Date of Birth/Sex: Male) Treating Dellamae Rosamilia, Abdulrahim Primary Care Physician: Montez Morita, MONICA Physician/Extender: G Referring Physician: Kirt Boys Weeks in Treatment: 1 Information Obtained from: Patient Chief Complaint Patient is here for our review of bilateral heel ulcers for the last 5-6 months Electronic Signature(s) Signed: 05/05/2016 7:52:30 AM By: Baltazar Najjar MD Entered By: Baltazar Najjar on 05/04/2016 15:03:19 Arnaud, Yavier L. ([id number]) -------------------------------------------------------------------------------- Debridement Details Patient Name: Paternostro, Reynard L. Date of Service: 05/04/2016 12:45 PM Medical Record Patient Account Number: [id number] Number: Treating RN: Phillis Haggis June 02, 1946 (70 y.o. Other Clinician: Date of Birth/Sex: Male) Treating Pavlos Yon, Shaunak Primary Care Physician: Montez Morita, MONICA Physician/Extender: G Referring Physician: Montez Morita, MONICA Weeks in Treatment: 1 Debridement Performed for Wound #1 Left Calcaneus Assessment: Performed By: Physician Maxwell Caul, MD Debridement: Debridement Pre-procedure Yes Verification/Time Out Taken: Start Time: 13:25 Pain Control: Lidocaine 4% Topical Solution Level: Skin/Subcutaneous Tissue Total Area Debrided (L x 2 (cm) x 3 (cm) = 6 (cm) W): Tissue and other Viable, Non-Viable, Exudate, Fibrin/Slough, Subcutaneous material debrided: Instrument: Curette Bleeding: Minimum Hemostasis Achieved: Pressure End Time: 13:27 Procedural Pain: 0 Post Procedural Pain: 0 Response to Treatment: Procedure was tolerated well Post Debridement Measurements of Total Wound Length: (cm) 2 Stage:  Category/Stage II Width: (cm) 3 Depth: (cm) 0.3 Volume: (cm) 1.414 Post Procedure Diagnosis Same as Pre-procedure Electronic Signature(s) Signed: 05/04/2016 4:50:53 PM By: Alejandro Mulling Signed: 05/05/2016 7:52:30 AM By: Baltazar Najjar MD Entered By: Baltazar Najjar on 05/04/2016 15:02:35 Tye, Miloh L. ([id number]) -------------------------------------------------------------------------------- Debridement Details Patient Name: Schabel, Aran L. Date of Service: 05/04/2016 12:45 PM Medical Record Patient Account Number: [id number] Number: Treating RN: Phillis Haggis Jan 25, 1946 (70 y.o. Other Clinician: Date of Birth/Sex: Male) Treating Avier Jech, Prosper Primary Care Physician: Montez Morita, MONICA Physician/Extender: G Referring Physician: Montez Morita, MONICA Weeks in Treatment: 1 Debridement Performed for Wound #2 Right,Lateral Calcaneus Assessment: Performed By: Physician Maxwell Caul, MD Debridement: Debridement Pre-procedure Yes Verification/Time Out Taken: Start Time: 13:28 Pain Control: Lidocaine 4% Topical Solution Level: Skin/Subcutaneous Tissue Total Area Debrided (L x 2.5 (cm) x 3.9 (cm) = 9.75 (cm) W): Tissue and other Viable, Non-Viable, Exudate, Fibrin/Slough, Subcutaneous material debrided: Instrument: Curette Bleeding: Minimum Hemostasis Achieved: Pressure End Time: 13:32 Procedural Pain: 0 Post Procedural Pain: 0 Response to Treatment: Procedure was tolerated well Post Debridement Measurements of Total Wound Length: (cm) 2.5 Stage: Category/Stage IV Width: (cm) 3.9 Depth: (cm) 0.6 Volume: (cm) 4.595 Post Procedure Diagnosis Same as Pre-procedure Electronic Signature(s) Signed: 05/04/2016 4:50:53 PM By: Alejandro Mulling Signed: 05/05/2016 7:52:30 AM By: Baltazar Najjar MD Entered By: Baltazar Najjar on 05/04/2016 15:02:53 Bey, Taveon L.  ([id number]) -------------------------------------------------------------------------------- HPI Details Patient Name: Hinch, Dmarion L. Date of Service: 05/04/2016 12:45 PM Medical Record Patient Account Number: [id number] Number: Treating RN: Phillis Haggis 07-08-1946 (70 y.o. Other Clinician: Date of Birth/Sex: Male) Treating Joakim Huesman, [place] Primary Care Physician: Montez Morita, MONICA Physician/Extender: G Referring Physician: Kirt Boys Weeks in Treatment: 1 History of Present Illness HPI Description: 04/27/16; this is a 70 year old man who is a resident of [facility] in [place]. He is accompanied by a family friend today. She tells me that he has had problems with pressure ulcers since December 2016 largely at a time where he apparently had diabetic hypoglycemic episodes. Apparently these became so severe that  he had to be transferred to [facility] for operative debridement apparently bilaterally. I don't have any records from [facility] but per his friend they did not do imaging studies bone cultures arterial studies etc. Apparently the area just applying wet to dry dressings to these. The patient is a diabetic on insulin. His arterial studies here were noncompressible although his pulses could be dopplered. 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 At one point I note in his notes he was on hospice I'm not really sure whether that is true currently or not. 05/04/16; x-ray of the right heel done in his skilled facility was negative for significant bone pathology. Arterial studies also done in the facility revealed noncompressible vessels therefore no ABI could be calculated. His arterial ultrasound showed low velocities scattered atherosclerotic disease negative  for focal stenosis but monophasic waveforms bilaterally. The overall impression was "mild peripheral arterial disease" Electronic Signature(s) Signed: 05/05/2016 7:52:30 AM By: Baltazar Najjar MD Entered By: Baltazar Najjar on 05/04/2016 15:04:42 Ground, Hilton L. ([id number]) -------------------------------------------------------------------------------- Physical Exam Details Patient Name: Kipp, Kweli L. Date of Service: 05/04/2016 12:45 PM Medical Record Patient Account Number: [id number] Number: Treating RN: Phillis Haggis [birth year] (70 y.o. Other Clinician: Date of Birth/Sex: Male) Treating Makaelyn Aponte, Bentley Primary Care Physician: Montez Morita, MONICA Physician/Extender: G Referring Physician: Montez Morita, MONICA Weeks in Treatment: 1 Notes Wound exam; the patient has substantial wounds on the right that greater than left heel. The area on the right has a substantial amount of drainage therefore I cultured. There is a small area of exposed bone. I have not yet biopsy this waiting for records from [facility]. The right heel was substantially debridement of nonviable subcutaneous tissue the area on the left had lesser degrees of change the base of the granulation appears to be healthier. Electronic Signature(s) Signed: 05/05/2016 7:52:30 AM By: Baltazar Najjar MD Entered By: Baltazar Najjar on 05/04/2016 15:05:58 League, [name] -------------------------------------------------------------------------------- Physician Orders Details Patient Name: Radliff, Brit L. Date of Service: 05/04/2016 12:45 PM Medical Record Patient Account Number: [id number] Number: Treating RN: Phillis Haggis 18-Nov-1946 (70 y.o. Other Clinician: Date of Birth/Sex: Male) Treating Darianny Momon, Deshon Primary Care Physician: Montez Morita, MONICA Physician/Extender: G Referring Physician: Kirt Boys Weeks in Treatment: 1 Verbal / Phone Orders: Yes Clinician: Pinkerton,  Debi Read Back and Verified: Yes Diagnosis Coding Wound Cleansing Wound #1 Left Calcaneus o Clean wound with Normal Saline. Wound #2 Right,Lateral Calcaneus o Clean wound with Normal Saline. Anesthetic Wound #1 Left Calcaneus o Topical Lidocaine 4% cream applied to wound bed prior to debridement - for clinic use only Wound #2 Right,Lateral Calcaneus o Topical Lidocaine 4% cream applied to wound bed prior to debridement - for clinic use only Skin Barriers/Peri-Wound Care Wound #1 Left Calcaneus o Barrier cream Wound #2 Right,Lateral Calcaneus o Barrier cream Primary Wound Dressing Wound #1 Left Calcaneus o Aquacel Ag - silver alginate Wound #2 Right,Lateral Calcaneus o Aquacel Ag - silver alginate Secondary Dressing Wound #1 Left Calcaneus o ABD pad o Dry Gauze o Conform/Kerlix o Foam Spath, Shrihaan L. ([id number]) Wound #2 Right,Lateral Calcaneus o ABD pad o Dry Gauze o Conform/Kerlix o Foam Dressing Change Frequency Wound #1 Left Calcaneus o Change dressing every day. Wound #2 Right,Lateral Calcaneus o Change dressing every day. Follow-up Appointments Wound #1 Left Calcaneus o Return Appointment in 1 week. Wound #2 Right,Lateral  Calcaneus o Return Appointment in 1 week. Off-Loading Wound #1 Left Calcaneus o Heel suspension boot to: - please float the heels when pt is sitting with feet up or when pt is lying in bed. o Turn and reposition every 2 hours Wound #2 Right,Lateral Calcaneus o Heel suspension boot to: - please float the heels when pt is sitting with feet up or when pt is lying in bed. o Turn and reposition every 2 hours Additional Orders / Instructions Wound #1 Left Calcaneus o Increase protein intake. Wound #2 Right,Lateral Calcaneus o Increase protein intake. Medications-please add to medication list. Wound #1 Left Calcaneus o Other: - Vitamin C, Zinc, Multivitamin Wound #2 Right,Lateral  Calcaneus o Other: - Vitamin C, Zinc, Multivitamin Laboratory Lindblad, Jasn L. ([id number]) o Bacteria identified in Wound by Culture (MICRO) - right lateral calcaneous oooo LOINC Code: 6462-6 oooo Convenience Name: Wound culture routine Electronic Signature(s) Signed: 05/04/2016 4:50:53 PM By: Alejandro Mulling Signed: 05/05/2016 7:52:30 AM By: Baltazar Najjar MD Entered By: Alejandro Mulling on 05/04/2016 13:46:27 Bergland, Zanden L. ([id number]) -------------------------------------------------------------------------------- Problem List Details Patient Name: Ezekiel, Akili L. Date of Service: 05/04/2016 12:45 PM Medical Record Patient Account Number: [id number] Number: Treating RN: Phillis Haggis [birth year] (70 y.o. 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 ([id number]) -------------------------------------------------------------------------------- Progress Note Details Patient Name: Strader, Othell L. Date of Service: 05/04/2016 12:45 PM Medical Record Patient Account Number: [id number] Number: Treating RN: Phillis Haggis [birth year] (70 y.o. Other Clinician: Date of Birth/Sex: Male) Treating Torin Whisner, Jeremaih Primary Care Physician: Montez Morita,  MONICA Physician/Extender: G Referring Physician: Kirt Boys Weeks in Treatment: 1 Subjective Chief Complaint Information obtained from Patient Patient is here for our review of bilateral heel ulcers for the last 5-6 months History of Present Illness (HPI) 04/27/16; this is a 70 year old man who is a resident of [facility] in [place]. He is accompanied by a family friend today. She tells me that he has had problems with pressure ulcers since December 2016 largely at a time where he apparently had diabetic hypoglycemic episodes. Apparently these became so severe that he had to be transferred to [facility] for operative debridement apparently bilaterally. I don't have any records from [facility] but per his friend they did not do imaging studies bone cultures arterial studies etc. Apparently the area just applying wet to dry dressings to these. The patient is a diabetic on insulin. His arterial studies here were noncompressible although his pulses could be dopplered. 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 At one point I note in his notes he was on hospice I'm not really sure whether that is true currently or not. 05/04/16; x-ray of the right heel done in his skilled facility was negative for significant bone pathology. Arterial studies also done in the facility revealed noncompressible  vessels therefore no ABI could be calculated. His arterial ultrasound showed low velocities scattered atherosclerotic disease negative for focal stenosis but monophasic waveforms bilaterally. The overall impression was "mild peripheral arterial disease" Objective Constitutional Vitals Time Taken: 12:54 PM, Height: 65 in, Pulse: 72 bpm, Respiratory Rate: 16 breaths/min, Blood Pressure: 115/50  mmHg. Integumentary (Hair, Skin) Wound #1 status is Open. Original cause of wound was Pressure Injury. The wound is located on the Left Calcaneus. The wound measures 2cm length x 3cm width x 0.2cm depth; 4.712cm^2 area and 0.942cm^3 Cinquemani, Infant L. ([id number]) volume. The wound is limited to skin breakdown. There is no tunneling or undermining noted. There is a large amount of serosanguineous drainage noted. The wound margin is thickened. There is medium (34- 66%) red granulation within the wound bed. There is a medium (34-66%) amount of necrotic tissue within the wound bed including Adherent Slough. The periwound skin appearance exhibited: Moist. Periwound temperature was noted as No Abnormality. The periwound has tenderness on palpation. Wound #2 status is Open. Original cause of wound was Pressure Injury. The wound is located on the Right,Lateral Calcaneus. The wound measures 2.5cm length x 3.9cm width x 0.5cm depth; 7.658cm^2 area and 3.829cm^3 volume. There is bone, muscle, and tendon exposed. There is no tunneling or undermining noted. There is a large amount of serosanguineous drainage noted. The wound margin is thickened. There is small (1-33%) red granulation within the wound bed. There is a large (67-100%) amount of necrotic tissue within the wound bed including Adherent Slough. The periwound skin appearance exhibited: Localized Edema, Moist, Erythema. The surrounding wound skin color is noted with erythema which is circumferential. Periwound temperature was noted as No Abnormality. The periwound has tenderness on palpation. 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 There was a Skin/Subcutaneous Tissue Debridement ([id number]) debridement with total area of 6 sq cm performed by Maxwell Caul, MD. with the following instrument(s): Curette to remove Viable and Non-Viable tissue/material including Exudate, Fibrin/Slough, and Subcutaneous after achieving pain control using Lidocaine 4% Topical Solution. A time out was conducted prior to the start of the procedure. A Minimum amount of bleeding was controlled with Pressure. The procedure was tolerated well with a pain level of 0 throughout and a pain level of 0 following the procedure. Post Debridement Measurements: 2cm length x 3cm width x 0.3cm depth; 1.414cm^3 volume. Post debridement Stage noted as Category/Stage II. Post procedure Diagnosis Wound #1: Same as Pre-Procedure Wound #2 Wound #2 is a Pressure Ulcer located on the Right,Lateral Calcaneus . There was a Skin/Subcutaneous Mini, Lyndon L. ([id number]) Tissue Debridement ([id number]) debridement with total area of 9.75 sq cm performed by Maxwell Caul, MD. with the following instrument(s): Curette to remove Viable and Non-Viable tissue/material including Exudate, Fibrin/Slough, and Subcutaneous after achieving pain control using Lidocaine 4% Topical Solution. A time out was conducted prior to the start of the procedure. A Minimum amount of bleeding was controlled with Pressure. The procedure was tolerated well with a pain level of 0 throughout and a pain level of 0 following the procedure. Post Debridement Measurements: 2.5cm length x 3.9cm width x 0.6cm depth; 4.595cm^3 volume. Post debridement Stage noted as Category/Stage IV. Post  procedure Diagnosis Wound #2: Same as Pre-Procedure Plan Wound Cleansing: Wound #1 Left Calcaneus: Clean wound with Normal Saline. Wound #2 Right,Lateral Calcaneus: Clean wound with Normal Saline. Anesthetic: Wound #1 Left Calcaneus: Topical Lidocaine 4% cream applied to wound bed prior to  debridement - for clinic use only Wound #2 Right,Lateral Calcaneus: Topical Lidocaine 4% cream applied to wound bed prior to debridement - for clinic use only Skin Barriers/Peri-Wound Care: Wound #1 Left Calcaneus: Barrier cream Wound #2 Right,Lateral Calcaneus: Barrier cream Primary Wound Dressing: Wound #1 Left Calcaneus: Aquacel Ag - silver alginate Wound #2 Right,Lateral Calcaneus: Aquacel Ag - silver alginate Secondary Dressing: Wound #1 Left Calcaneus: ABD pad Dry Gauze Conform/Kerlix Foam Wound #2 Right,Lateral Calcaneus: ABD pad Dry Gauze Conform/Kerlix Foam Dressing Change Frequency: Wound #1 Left Calcaneus: Change dressing every day. Recinos, Ellery L. ([id number]) Wound #2 Right,Lateral Calcaneus: Change dressing every day. Follow-up Appointments: Wound #1 Left Calcaneus: Return Appointment in 1 week. Wound #2 Right,Lateral Calcaneus: Return Appointment in 1 week. Off-Loading: Wound #1 Left Calcaneus: Heel suspension boot to: - please float the heels when pt is sitting with feet up or when pt is lying in bed. Turn and reposition every 2 hours Wound #2 Right,Lateral Calcaneus: Heel suspension boot to: - please float the heels when pt is sitting with feet up or when pt is lying in bed. Turn and reposition every 2 hours Additional Orders / Instructions: Wound #1 Left Calcaneus: Increase protein intake. Wound #2 Right,Lateral Calcaneus: Increase protein intake. Medications-please add to medication list.: Wound #1 Left Calcaneus: Other: - Vitamin C, Zinc, Multivitamin Wound #2 Right,Lateral Calcaneus: Other: - Vitamin C, Zinc, Multivitamin Laboratory ordered were: Wound culture routine - right lateral calcaneous #1 substantial pressure ulcerations on the right greater than left heel as described. Both underwent a surgical debridement. Because her intake nurse noted copious amounts of drainage I did a culture of the right heel but no empiric antibiotics as  of yet. #2 there is still an area of exposed bone year and it might be possible to culture this although I would like to see if this was done at the time of his operative debridement at [facility] before I pursue this. #3 he continues in basic healing sandals and bunny boots at night. When he is not up on this with physical therapy I recommend being off these heels and I explained that to the sister and friend who are with him. #4 is much more alert today according to his sister again this is because they stopped his OxyContin. He has a history of significant cirrhosis and hepatic encephalopathy as well Electronic Signature(s) Signed: 05/05/2016 7:52:30 AM By: Baltazar Najjarobson, Rimas MD Entered By: Baltazar Najjarobson, Fischer on 05/04/2016 15:07:58 Maiolo, Shayden L. ([id number]) -------------------------------------------------------------------------------- SuperBill Details Patient Name: Banfield, Reggie L. Date of Service: 05/04/2016 Medical Record Patient Account Number: [id number] Number: Treating RN: Phillis Haggisinkerton, Debi [birth year] (70 y.o. 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, stage 4 L89.623 Pressure ulcer of left heel, stage 3 I35.0 Nonrheumatic aortic (valve) stenosis Facility Procedures CPT4 Code: [id number] Description: 11042 - DEB SUBQ TISSUE 20 SQ CM/< ICD-10 Description Diagnosis E11.621 Type 2 diabetes mellitus with foot ulcer Modifier: Quantity: 1 Physician Procedures CPT4 Code: [id number] Description: 11042 - WC PHYS SUBQ  TISS 20 SQ CM ICD-10 Description Diagnosis E11.621 Type 2 diabetes mellitus with foot ulcer Modifier: Quantity: 1 Electronic  Signature(s) Signed: 05/05/2016 7:52:30 AM By: Baltazar Najjar MD Entered By: Baltazar Najjar on 05/04/2016 15:08:38

## 2016-05-05 NOTE — Progress Notes (Signed)
[name] NakaiSHORT, [name] ([id number]) Visit Report for 05/04/2016 Arrival Information Details Patient Name: [name], [name] Date of Service: 05/04/2016 12:45 PM Medical Record Number: [id number] Patient Account Number: [id number] Date of Birth/Sex: June 08, 1946 [age] y.o. Male) Treating RN: Phillis HaggisPinkerton, Debi Primary Care Physician: Montez MoritaARTER, MONICA Other Clinician: Referring Physician: Montez MoritaARTER, MONICA Treating Physician/Extender: Maxwell CaulOBSON, Donnell G Weeks in Treatment: 1 Visit Information History Since Last Visit All ordered tests and consults were completed: No Patient Arrived: Wheel Chair Added or deleted any medications: No Arrival Time: 12:53 Any new allergies or adverse reactions: No Accompanied By: sister and friend Had a fall or experienced change in No Transfer Assistance: EasyPivot Patient activities of daily living that may affect Lift risk of falls: Patient Identification Verified: Yes Signs or symptoms of abuse/neglect since last No Secondary Verification Process Yes visito Completed: Hospitalized since last visit: No Patient Requires Transmission- No Pain Present Now: No Based Precautions: Patient Has Alerts: Yes Patient Alerts: DM II bil legs non- compressible Electronic Signature(s) Signed: 05/04/2016 4:50:53 PM By: Alejandro MullingPinkerton, Debra Entered By: Alejandro MullingPinkerton, Debra on 05/04/2016 12:53:51 [name], [name] ([id number]) -------------------------------------------------------------------------------- Encounter Discharge Information Details Patient Name: Mungia, Elijiah L. Date of Service: 05/04/2016 12:45 PM Medical Record Number: [id number] Patient Account Number: [id number] Date of Birth/Sex: June 08, 1946 [age] y.o. Male) Treating RN: Phillis HaggisPinkerton, Debi Primary Care Physician: Montez MoritaARTER, MONICA Other Clinician: Referring Physician: Montez MoritaARTER, MONICA Treating Physician/Extender: Altamese CarolinaOBSON, Markevius G Weeks in Treatment: 1 Encounter Discharge Information Items Discharge Pain Level: 0 Discharge  Condition: Stable Ambulatory Status: Wheelchair Discharge Destination: Nursing Home Transportation: Other sister and Accompanied By: friend Schedule Follow-up Appointment: Yes Medication Reconciliation completed and provided to Patient/Care Yes [name]: Provided on Clinical Summary of Care: 05/04/2016 Form Type Recipient Paper Patient MS Electronic Signature(s) Signed: 05/04/2016 1:51:04 PM By: Gwenlyn PerkingMoore, Shelia Entered By: Gwenlyn PerkingMoore, Shelia on 05/04/2016 13:51:04 [name], [name] ([id number]) -------------------------------------------------------------------------------- Lower Extremity Assessment Details Patient Name: Smalling, Autumn L. Date of Service: 05/04/2016 12:45 PM Medical Record Number: [id number] Patient Account Number: [id number] Date of Birth/Sex: June 08, 1946 [age] y.o. Male) Treating RN: Phillis HaggisPinkerton, Debi Primary Care Physician: Montez MoritaARTER, MONICA Other Clinician: Referring Physician: Montez MoritaARTER, MONICA Treating Physician/Extender: Maxwell CaulOBSON, Brentin G Weeks in Treatment: 1 Vascular Assessment Pulses: Posterior Tibial Dorsalis Pedis Palpable: [Left:No] [Right:No] Doppler: [Left:Monophasic] [Right:Multiphasic] Extremity colors, hair growth, and conditions: Extremity Color: [Left:Normal] [Right:Normal] Temperature of Extremity: [Left:Warm] [Right:Warm] Capillary Refill: [Left:< 3 seconds] [Right:< 3 seconds] Electronic Signature(s) Signed: 05/04/2016 4:50:53 PM By: Alejandro MullingPinkerton, Debra Entered By: Alejandro MullingPinkerton, Debra on 05/04/2016 12:57:23 Serrao, Michell L. ([id number]) -------------------------------------------------------------------------------- Multi Wound Chart Details Patient Name: Leflore, Nabil L. Date of Service: 05/04/2016 12:45 PM Medical Record Number: [id number] Patient Account Number: [id number] Date of Birth/Sex: June 08, 1946 [age] y.o. Male) Treating RN: Phillis HaggisPinkerton, Debi Primary Care Physician: Montez MoritaARTER, MONICA Other Clinician: Referring Physician: Montez MoritaARTER, MONICA Treating  Physician/Extender: Maxwell CaulOBSON, Johny G Weeks in Treatment: 1 Vital Signs Height(in): 65 Pulse(bpm): 72 Weight(lbs): Blood Pressure 115/50 (mmHg): Body Mass Index(BMI): Temperature(F): Respiratory Rate 16 (breaths/min): Photos: [1:No Photos] [2:No Photos] [N/A:N/A] Wound Location: [1:Left Calcaneus] [2:Right Calcaneus - Lateral] [N/A:N/A] Wounding Event: [1:Pressure Injury] [2:Pressure Injury] [N/A:N/A] Primary Etiology: [1:Pressure Ulcer] [2:Pressure Ulcer] [N/A:N/A] Comorbid History: [1:Anemia, Hypertension, Type II Diabetes] [2:Anemia, Hypertension, Type II Diabetes] [N/A:N/A] Date Acquired: [1:12/11/2015] [2:12/11/2015] [N/A:N/A] Weeks of Treatment: [1:1] [2:1] [N/A:N/A] Wound Status: [1:Open] [2:Open] [N/A:N/A] Measurements L x W x D 2x3x0.2 [2:2.5x3.9x0.5] [N/A:N/A] (cm) Area (cm) : [1:4.712] [2:7.658] [N/A:N/A] Volume (cm) : [1:0.942] [2:3.829] [N/A:N/A] % Reduction in Area: [1:21.00%] [2:13.80%] [N/A:N/A] % Reduction in Volume: 21.00% [2:13.80%] [N/A:N/A] Classification: [1:Category/Stage II] [2:Category/Stage IV] [  N/A:N/A] HBO Classification: [1:Grade 1] [2:Grade 1] [N/A:N/A] Exudate Amount: [1:Large] [2:Large] [N/A:N/A] Exudate Type: [1:Serosanguineous] [2:Serosanguineous] [N/A:N/A] Exudate Color: [1:red, brown] [2:red, brown] [N/A:N/A] Wound Margin: [1:Thickened] [2:Thickened] [N/A:N/A] Granulation Amount: [1:Medium (34-66%)] [2:Small (1-33%)] [N/A:N/A] Granulation Quality: [1:Red] [2:Red] [N/A:N/A] Necrotic Amount: [1:Medium (34-66%)] [2:Large (67-100%)] [N/A:N/A] Exposed Structures: [1:Fascia: No Fat: No Tendon: No Muscle: No Joint: No Bone: No] [2:Tendon: Yes Muscle: Yes Bone: Yes Fascia: No Fat: No Joint: No] [N/A:N/A] Limited to Skin Breakdown Epithelialization: None None N/A Periwound Skin Texture: No Abnormalities Noted Edema: Yes N/A Periwound Skin Moist: Yes Moist: Yes N/A Moisture: Periwound Skin Color: No Abnormalities Noted Erythema: Yes  N/A Erythema Location: N/A Circumferential N/A Temperature: No Abnormality No Abnormality N/A Tenderness on Yes Yes N/A Palpation: Wound Preparation: Ulcer Cleansing: Ulcer Cleansing: N/A Rinsed/Irrigated with Rinsed/Irrigated with Saline Saline Topical Anesthetic Topical Anesthetic Applied: Other: lidocaine Applied: Other: lidocaine 4% 4% Treatment Notes Electronic Signature(s) Signed: 05/04/2016 4:50:53 PM By: Alejandro Mulling Entered By: Alejandro Mulling on 05/04/2016 13:08:04 Carlini, Rondall [name] [name] ([id number]) -------------------------------------------------------------------------------- Multi-Disciplinary Care Plan Details Patient Name: Runco, Dravon L. Date of Service: 05/04/2016 12:45 PM Medical Record Number: [id number] Patient Account Number: [id number] Date of Birth/Sex: 1946-01-14 (70 y.o. Male) Treating RN: Phillis Haggis Primary Care Physician: Montez Morita, MONICA Other Clinician: Referring Physician: Montez Morita, MONICA Treating Physician/Extender: Altamese Bergen in Treatment: 1 Active Inactive Abuse / Safety / Falls / Self Care Management Nursing Diagnoses: Potential for falls Goals: Patient will remain injury free Date Initiated: 04/27/2016 Goal Status: Active Interventions: Assess fall risk on admission and as needed Assess self care needs on admission and as needed Notes: Nutrition Nursing Diagnoses: Imbalanced nutrition Goals: Patient/caregiver agrees to and verbalizes understanding of need to use nutritional supplements and/or vitamins as prescribed Date Initiated: 04/27/2016 Goal Status: Active Interventions: Assess patient nutrition upon admission and as needed per policy Notes: Orientation to the Wound Care Program Nursing Diagnoses: Knowledge deficit related to the wound healing center program Goals: Kuba, Pope [name] [name] ([id number]) Patient/caregiver will verbalize understanding of the Wound Healing Center Program Date Initiated: 04/27/2016 Goal  Status: Active Interventions: Provide education on orientation to the wound center Notes: Pain, Acute or Chronic Nursing Diagnoses: Pain, acute or chronic: actual or potential Potential alteration in comfort, pain Goals: Patient will verbalize adequate pain control and receive pain control interventions during procedures as needed Date Initiated: 04/27/2016 Goal Status: Active Patient/caregiver will verbalize adequate pain control between visits Date Initiated: 04/27/2016 Goal Status: Active Interventions: Assess comfort goal upon admission Complete pain assessment as per visit requirements Notes: Pressure Nursing Diagnoses: Knowledge deficit related to causes and risk factors for pressure ulcer development Knowledge deficit related to management of pressures ulcers Goals: Patient will remain free from development of additional pressure ulcers Date Initiated: 04/27/2016 Goal Status: Active Interventions: Assess: immobility, friction, shearing, incontinence upon admission and as needed Assess offloading mechanisms upon admission and as needed Assess potential for pressure ulcer upon admission and as needed Notes: [name], [name] ([id number]) Wound/Skin Impairment Nursing Diagnoses: Impaired tissue integrity Goals: Ulcer/skin breakdown will have a volume reduction of 30% by week 4 Date Initiated: 04/27/2016 Goal Status: Active Ulcer/skin breakdown will have a volume reduction of 50% by week 8 Date Initiated: 04/27/2016 Goal Status: Active Ulcer/skin breakdown will have a volume reduction of 80% by week 12 Date Initiated: 04/27/2016 Goal Status: Active Interventions: Assess patient/caregiver ability to perform ulcer/skin care regimen upon admission and as needed Assess ulceration(s) every visit Notes: Electronic Signature(s) Signed: 05/04/2016 4:50:53 PM By: Alejandro Mulling Entered By:  Alejandro Mulling on 05/04/2016 13:07:48 [name], [name]  ([id number]) -------------------------------------------------------------------------------- Pain Assessment Details Patient Name: Anton, Natividad L. Date of Service: 05/04/2016 12:45 PM Medical Record Number: [id number] Patient Account Number: [id number] Date of Birth/Sex: [birth year] (70 y.o. Male) Treating RN: Phillis Haggis Primary Care Physician: Montez Morita, MONICA Other Clinician: Referring Physician: Montez Morita, MONICA Treating Physician/Extender: Maxwell Caul Weeks in Treatment: 1 Active Problems Location of Pain Severity and Description of Pain Patient Has Paino No Site Locations Pain Management and Medication Current Pain Management: Electronic Signature(s) Signed: 05/04/2016 4:50:53 PM By: Alejandro Mulling Entered By: Alejandro Mulling on 05/04/2016 12:53:58 [name], [name] ([id number]) -------------------------------------------------------------------------------- Patient/Caregiver Education Details Patient Name: Leisinger, Chalmers L. Date of Service: 05/04/2016 12:45 PM Medical Record Number: [id number] Patient Account Number: [id number] Date of Birth/Gender: [birth year] (70 y.o. Male) Treating RN: Phillis Haggis Primary Care Physician: Montez Morita, MONICA Other Clinician: Referring Physician: Montez Morita, MONICA Treating Physician/Extender: Altamese Caraway in Treatment: 1 Education Assessment Education Provided To: Patient Education Topics Provided Wound/Skin Impairment: Handouts: Other: change dressing as ordered Methods: Demonstration, Explain/Verbal Responses: State content correctly Electronic Signature(s) Signed: 05/04/2016 4:50:53 PM By: Alejandro Mulling Entered By: Alejandro Mulling on 05/04/2016 13:47:46 [name], [name] ([id number]) -------------------------------------------------------------------------------- Wound Assessment Details Patient Name: Mcbane, Ridgely L. Date of Service: 05/04/2016 12:45 PM Medical Record Number: [id number] Patient Account Number:  [id number] Date of Birth/Sex: [birth year] (70 y.o. Male) Treating RN: Phillis Haggis Primary Care Physician: Montez Morita, MONICA Other Clinician: Referring Physician: Montez Morita, MONICA Treating Physician/Extender: Maxwell Caul Weeks in Treatment: 1 Wound Status Wound Number: 1 Primary Pressure Ulcer Etiology: Wound Location: Left Calcaneus Wound Status: Open Wounding Event: Pressure Injury Comorbid Anemia, Hypertension, Type II Date Acquired: 12/11/2015 History: Diabetes Weeks Of Treatment: 1 Clustered Wound: No Photos Photo Uploaded By: Alejandro Mulling on 05/04/2016 16:42:44 Wound Measurements Length: (cm) 2 Width: (cm) 3 Depth: (cm) 0.2 Area: (cm) 4.712 Volume: (cm) 0.942 % Reduction in Area: 21% % Reduction in Volume: 21% Epithelialization: None Tunneling: No Undermining: No Wound Description Classification: Category/Stage II Diabetic Severity [address]): Grade 1 Wound Margin: Thickened Exudate Amount: Large Exudate Type: Serosanguineous Exudate Color: red, brown Foul Odor After Cleansing: No Wound Bed Granulation Amount: Medium (34-66%) Exposed Structure Granulation Quality: Red Fascia Exposed: No Necrotic Amount: Medium (34-66%) Fat Layer Exposed: No Wineland, Jacarius L. ([id number]) Necrotic Quality: Adherent Slough Tendon Exposed: No Muscle Exposed: No Joint Exposed: No Bone Exposed: No Limited to Skin Breakdown Periwound Skin Texture Texture Color No Abnormalities Noted: No No Abnormalities Noted: No Moisture Temperature / Pain No Abnormalities Noted: No Temperature: No Abnormality Moist: Yes Tenderness on Palpation: Yes Wound Preparation Ulcer Cleansing: Rinsed/Irrigated with Saline Topical Anesthetic Applied: Other: lidocaine 4%, Treatment Notes Wound #1 (Left Calcaneus) 1. Cleansed with: Clean wound with Normal Saline 2. Anesthetic Topical Lidocaine 4% cream to wound bed prior to debridement 3. Peri-wound Care: Barrier cream 4. Dressing  Applied: Aquacel Ag 5. Secondary Dressing Applied ABD Pad Dry Gauze Foam Kerlix/Conform 7. Secured with Secretary/administrator) Signed: 05/04/2016 4:50:53 PM By: Alejandro Mulling Entered By: Alejandro Mulling on 05/04/2016 13:07:41 Harrel, Hamsa L. ([id number]) -------------------------------------------------------------------------------- Wound Assessment Details Patient Name: Kindall, Kaydyn L. Date of Service: 05/04/2016 12:45 PM Medical Record Number: [id number] Patient Account Number: [id number] Date of Birth/Sex: [birth year] (70 y.o. Male) Treating RN: Phillis Haggis Primary Care Physician: Montez Morita, MONICA Other Clinician: Referring Physician: Montez Morita, MONICA Treating Physician/Extender: Maxwell Caul Weeks in Treatment: 1 Wound Status Wound Number: 2 Primary Pressure Ulcer Etiology: Wound Location: Right Calcaneus - Lateral Wound Status: Open Wounding  Event: Pressure Injury Comorbid Anemia, Hypertension, Type II Date Acquired: 12/11/2015 History: Diabetes Weeks Of Treatment: 1 Clustered Wound: No Photos Photo Uploaded By: Alejandro Mulling on 05/04/2016 16:42:45 Wound Measurements Length: (cm) 2.5 Width: (cm) 3.9 Depth: (cm) 0.5 Area: (cm) 7.658 Volume: (cm) 3.829 % Reduction in Area: 13.8% % Reduction in Volume: 13.8% Epithelialization: None Tunneling: No Undermining: No Wound Description Classification: Category/Stage IV Diabetic Severity [address]): Grade 1 Wound Margin: Thickened Exudate Amount: Large Exudate Type: Serosanguineous Exudate Color: red, brown Foul Odor After Cleansing: No Wound Bed Granulation Amount: Small (1-33%) Exposed Structure Granulation Quality: Red Fascia Exposed: No Necrotic Amount: Large (67-100%) Fat Layer Exposed: No Kroeze, Elva L. ([id number]) Necrotic Quality: Adherent Slough Tendon Exposed: Yes Muscle Exposed: Yes Necrosis of Muscle: No Joint Exposed: No Bone Exposed: Yes Periwound Skin  Texture Texture Color No Abnormalities Noted: No No Abnormalities Noted: No Localized Edema: Yes Erythema: Yes Erythema Location: Circumferential Moisture No Abnormalities Noted: No Temperature / Pain Moist: Yes Temperature: No Abnormality Tenderness on Palpation: Yes Wound Preparation Ulcer Cleansing: Rinsed/Irrigated with Saline Topical Anesthetic Applied: Other: lidocaine 4%, Treatment Notes Wound #2 (Right, Lateral Calcaneus) 1. Cleansed with: Clean wound with Normal Saline 2. Anesthetic Topical Lidocaine 4% cream to wound bed prior to debridement 3. Peri-wound Care: Barrier cream 4. Dressing Applied: Aquacel Ag 5. Secondary Dressing Applied ABD Pad Dry Gauze Foam Kerlix/Conform 7. Secured with Secretary/administrator) Signed: 05/04/2016 4:50:53 PM By: Alejandro Mulling Entered By: Alejandro Mulling on 05/04/2016 13:07:09 Speas, Srikar L. ([id number]) -------------------------------------------------------------------------------- Vitals Details Patient Name: Berrios, Ervine L. Date of Service: 05/04/2016 12:45 PM Medical Record Number: [id number] Patient Account Number: [id number] Date of Birth/Sex: 1946/06/21 (70 y.o. Male) Treating RN: Phillis Haggis Primary Care Physician: Montez Morita, MONICA Other Clinician: Referring Physician: Montez Morita, MONICA Treating Physician/Extender: Maxwell Caul Weeks in Treatment: 1 Vital Signs Time Taken: 12:54 Pulse (bpm): 72 Height (in): 65 Respiratory Rate (breaths/min): 16 Blood Pressure (mmHg): 115/50 Reference Range: 80 - 120 mg / dl Electronic Signature(s) Signed: 05/04/2016 4:50:53 PM By: Alejandro Mulling Entered By: Alejandro Mulling on 05/04/2016 12:56:01

## 2016-05-10 LAB — WOUND CULTURE

## 2016-05-11 ENCOUNTER — Encounter: Payer: PRIVATE HEALTH INSURANCE | Admitting: Internal Medicine

## 2016-05-11 DIAGNOSIS — E11621 Type 2 diabetes mellitus with foot ulcer: Secondary | ICD-10-CM | POA: Diagnosis not present

## 2016-05-13 NOTE — Progress Notes (Signed)
Cher NakaiSHORT, Gavin L. ([id number]) Visit Report for 05/11/2016 Arrival Information Details Patient Name: Whitmyer, Ikey L. Date of Service: 05/11/2016 12:45 PM Medical Record Number: [id number] Patient Account Number: [id number] Date of Birth/Sex: 10-20-1946 [age] y.o. Male) Treating RN: Phillis HaggisPinkerton, Debi Primary Care Physician: Montez MoritaARTER, MONICA Other Clinician: Referring Physician: Montez MoritaARTER, MONICA Treating Physician/Extender: Maxwell CaulOBSON, Khalik G Weeks in Treatment: 2 Visit Information History Since Last Visit All ordered tests and consults were completed: No Patient Arrived: Wheel Chair Added or deleted any medications: No Arrival Time: 12:54 Any new allergies or adverse reactions: No Accompanied By: sister Had a fall or experienced change in No Transfer Assistance: EasyPivot Patient activities of daily living that may affect Lift risk of falls: Patient Identification Verified: Yes Signs or symptoms of abuse/neglect since last No Secondary Verification Process Yes visito Completed: Hospitalized since last visit: No Patient Requires Transmission- No Pain Present Now: No Based Precautions: Patient Has Alerts: Yes Patient Alerts: DM II bil legs non- compressible Electronic Signature(s) Signed: 05/11/2016 4:11:15 PM By: Alejandro MullingPinkerton, Debra Entered By: Alejandro MullingPinkerton, Debra on 05/11/2016 12:54:31 Navejas, Mavrik L. ([id number]) -------------------------------------------------------------------------------- Encounter Discharge Information Details Patient Name: Batterson, Booker L. Date of Service: 05/11/2016 12:45 PM Medical Record Number: [id number] Patient Account Number: [id number] Date of Birth/Sex: 10-20-1946 [age] y.o. Male) Treating RN: Phillis HaggisPinkerton, Debi Primary Care Physician: Montez MoritaARTER, MONICA Other Clinician: Referring Physician: Montez MoritaARTER, MONICA Treating Physician/Extender: Altamese CarolinaOBSON, Malaquias G Weeks in Treatment: 2 Encounter Discharge Information Items Discharge Pain Level: 0 Discharge Condition:  Stable Ambulatory Status: Wheelchair Discharge Destination: Nursing Home Transportation: Other Accompanied By: sister Schedule Follow-up Appointment: Yes Medication Reconciliation completed and provided to Patient/Care Yes Jara Feider: Provided on Clinical Summary of Care: 05/11/2016 Form Type Recipient Paper Patient MS Electronic Signature(s) Signed: 05/11/2016 2:07:37 PM By: Gwenlyn PerkingMoore, Shelia Entered By: Gwenlyn PerkingMoore, Shelia on 05/11/2016 14:07:37 Andrzejewski, Ravinder L. ([id number]) -------------------------------------------------------------------------------- Lower Extremity Assessment Details Patient Name: Reinders, Marwin L. Date of Service: 05/11/2016 12:45 PM Medical Record Number: [id number] Patient Account Number: [id number] Date of Birth/Sex: 10-20-1946 [age] y.o. Male) Treating RN: Phillis HaggisPinkerton, Debi Primary Care Physician: Montez MoritaARTER, MONICA Other Clinician: Referring Physician: Montez MoritaARTER, MONICA Treating Physician/Extender: Maxwell CaulOBSON, Vontrell G Weeks in Treatment: 2 Vascular Assessment Pulses: Posterior Tibial Dorsalis Pedis Palpable: [Left:No] [Right:No] Doppler: [Left:Monophasic] [Right:Multiphasic] Extremity colors, hair growth, and conditions: Extremity Color: [Left:Normal] [Right:Normal] Temperature of Extremity: [Left:Warm] [Right:Warm] Capillary Refill: [Left:< 3 seconds] [Right:< 3 seconds] Electronic Signature(s) Signed: 05/11/2016 4:11:15 PM By: Alejandro MullingPinkerton, Debra Entered By: Alejandro MullingPinkerton, Debra on 05/11/2016 12:57:28 Mendell, Ketrick L. ([id number]) -------------------------------------------------------------------------------- Multi Wound Chart Details Patient Name: Hyams, Erma L. Date of Service: 05/11/2016 12:45 PM Medical Record Number: [id number] Patient Account Number: [id number] Date of Birth/Sex: 10-20-1946 [age] y.o. Male) Treating RN: Phillis HaggisPinkerton, Debi Primary Care Physician: Montez MoritaARTER, MONICA Other Clinician: Referring Physician: Montez MoritaARTER, MONICA Treating Physician/Extender: Maxwell CaulOBSON,  Pharaoh G Weeks in Treatment: 2 Vital Signs Height(in): 65 Pulse(bpm): 80 Weight(lbs): Blood Pressure 111/54 (mmHg): Body Mass Index(BMI): Temperature(F): 97.8 Respiratory Rate 18 (breaths/min): Photos: [1:No Photos] [2:No Photos] [N/A:N/A] Wound Location: [1:Left Calcaneus] [2:Right Calcaneus - Lateral] [N/A:N/A] Wounding Event: [1:Pressure Injury] [2:Pressure Injury] [N/A:N/A] Primary Etiology: [1:Pressure Ulcer] [2:Pressure Ulcer] [N/A:N/A] Secondary Etiology: [1:Diabetic Wound/Ulcer of the Lower Extremity] [2:Diabetic Wound/Ulcer of the Lower Extremity] [N/A:N/A] Comorbid History: [1:Anemia, Hypertension, Type II Diabetes] [2:Anemia, Hypertension, Type II Diabetes] [N/A:N/A] Date Acquired: [1:12/11/2015] [2:12/11/2015] [N/A:N/A] Weeks of Treatment: [1:2] [2:2] [N/A:N/A] Wound Status: [1:Open] [2:Open] [N/A:N/A] Measurements L x W x D 2x3x0.3 [2:2.7x3.8x0.5] [N/A:N/A] (cm) Area (cm) : [1:4.712] [2:8.058] [N/A:N/A] Volume (cm) : [1:1.414] [2:4.029] [N/A:N/A] % Reduction in Area: [1:21.00%] [2:9.30%] [N/A:N/A] %  Reduction in Volume: -18.60% [2:9.30%] [N/A:N/A] Classification: [1:Category/Stage II] [2:Category/Stage IV] [N/A:N/A] HBO Classification: [1:Grade 1] [2:Grade 1] [N/A:N/A] Exudate Amount: [1:Large] [2:Large] [N/A:N/A] Exudate Type: [1:Serosanguineous] [2:Serosanguineous] [N/A:N/A] Exudate Color: [1:red, brown] [2:red, brown] [N/A:N/A] Wound Margin: [1:Thickened] [2:Thickened] [N/A:N/A] Granulation Amount: [1:Medium (34-66%)] [2:Small (1-33%)] [N/A:N/A] Granulation Quality: [1:Red] [2:Red] [N/A:N/A] Necrotic Amount: [1:Medium (34-66%)] [2:Large (67-100%)] [N/A:N/A] Exposed Structures: [1:Fascia: No Fat: No Tendon: No Muscle: No] [2:Tendon: Yes Muscle: Yes Bone: Yes Fascia: No] [N/A:N/A] Joint: No Fat: No Bone: No Joint: No Limited to Skin Breakdown Epithelialization: None None N/A Periwound Skin Texture: No Abnormalities Noted Edema: Yes N/A Periwound  Skin Moist: Yes Moist: Yes N/A Moisture: Periwound Skin Color: No Abnormalities Noted Erythema: Yes N/A Erythema Location: N/A Circumferential N/A Temperature: No Abnormality No Abnormality N/A Tenderness on Yes Yes N/A Palpation: Wound Preparation: Ulcer Cleansing: Ulcer Cleansing: N/A Rinsed/Irrigated with Rinsed/Irrigated with Saline Saline Topical Anesthetic Topical Anesthetic Applied: Other: lidocaine Applied: Other: lidocaine 4% 4% Treatment Notes Electronic Signature(s) Signed: 05/11/2016 4:11:15 PM By: Alejandro Mulling Entered By: Alejandro Mulling on 05/11/2016 13:08:00 Sorce, Edouard Elbert Ewings ([id number]) -------------------------------------------------------------------------------- Multi-Disciplinary Care Plan Details Patient Name: Cuen, Haydon L. Date of Service: 05/11/2016 12:45 PM Medical Record Number: [id number] Patient Account Number: [id number] Date of Birth/Sex: 21-May-1946 (70 y.o. Male) Treating RN: Phillis Haggis Primary Care Physician: Montez Morita, MONICA Other Clinician: Referring Physician: Montez Morita, MONICA Treating Physician/Extender: Maxwell Caul Weeks in Treatment: 2 Active Inactive Abuse / Safety / Falls / Self Care Management Nursing Diagnoses: Potential for falls Goals: Patient will remain injury free Date Initiated: 04/27/2016 Goal Status: Active Interventions: Assess fall risk on admission and as needed Assess self care needs on admission and as needed Notes: Nutrition Nursing Diagnoses: Imbalanced nutrition Goals: Patient/caregiver agrees to and verbalizes understanding of need to use nutritional supplements and/or vitamins as prescribed Date Initiated: 04/27/2016 Goal Status: Active Interventions: Assess patient nutrition upon admission and as needed per policy Notes: Orientation to the Wound Care Program Nursing Diagnoses: Knowledge deficit related to the wound healing center program Goals: Gentile, Scotty Elbert Ewings  ([id number]) Patient/caregiver will verbalize understanding of the Wound Healing Center Program Date Initiated: 04/27/2016 Goal Status: Active Interventions: Provide education on orientation to the wound center Notes: Pain, Acute or Chronic Nursing Diagnoses: Pain, acute or chronic: actual or potential Potential alteration in comfort, pain Goals: Patient will verbalize adequate pain control and receive pain control interventions during procedures as needed Date Initiated: 04/27/2016 Goal Status: Active Patient/caregiver will verbalize adequate pain control between visits Date Initiated: 04/27/2016 Goal Status: Active Interventions: Assess comfort goal upon admission Complete pain assessment as per visit requirements Notes: Pressure Nursing Diagnoses: Knowledge deficit related to causes and risk factors for pressure ulcer development Knowledge deficit related to management of pressures ulcers Goals: Patient will remain free from development of additional pressure ulcers Date Initiated: 04/27/2016 Goal Status: Active Interventions: Assess: immobility, friction, shearing, incontinence upon admission and as needed Assess offloading mechanisms upon admission and as needed Assess potential for pressure ulcer upon admission and as needed Notes: Blumstein, Legrande L. ([id number]) Wound/Skin Impairment Nursing Diagnoses: Impaired tissue integrity Goals: Ulcer/skin breakdown will have a volume reduction of 30% by week 4 Date Initiated: 04/27/2016 Goal Status: Active Ulcer/skin breakdown will have a volume reduction of 50% by week 8 Date Initiated: 04/27/2016 Goal Status: Active Ulcer/skin breakdown will have a volume reduction of 80% by week 12 Date Initiated: 04/27/2016 Goal Status: Active Interventions: Assess patient/caregiver ability to perform ulcer/skin care regimen upon admission and as needed Assess ulceration(s) every visit Notes:  Electronic Signature(s) Signed: 05/11/2016  4:11:15 PM By: Alejandro Mulling Entered By: Alejandro Mulling on 05/11/2016 13:07:54 Mcfarland, Raffael L. ([id number]) -------------------------------------------------------------------------------- Pain Assessment Details Patient Name: Novitsky, Rodrick L. Date of Service: 05/11/2016 12:45 PM Medical Record Number: [id number] Patient Account Number: [id number] Date of Birth/Sex: 15-Dec-1945 (70 y.o. Male) Treating RN: Phillis Haggis Primary Care Physician: Montez Morita, MONICA Other Clinician: Referring Physician: Montez Morita, MONICA Treating Physician/Extender: Maxwell Caul Weeks in Treatment: 2 Active Problems Location of Pain Severity and Description of Pain Patient Has Paino No Site Locations Pain Management and Medication Current Pain Management: Electronic Signature(s) Signed: 05/11/2016 4:11:15 PM By: Alejandro Mulling Entered By: Alejandro Mulling on 05/11/2016 12:54:37 Allerton, Klever Elbert Ewings ([id number]) -------------------------------------------------------------------------------- Patient/Caregiver Education Details Patient Name: Wroblewski, Forrester L. Date of Service: 05/11/2016 12:45 PM Medical Record Number: [id number] Patient Account Number: [id number] Date of Birth/Gender: 09/26/1946 (70 y.o. Male) Treating RN: Phillis Haggis Primary Care Physician: Montez Morita, MONICA Other Clinician: Referring Physician: Montez Morita, MONICA Treating Physician/Extender: Altamese [place] in Treatment: 2 Education Assessment Education Provided To: Patient Education Topics Provided Wound/Skin Impairment: Handouts: Other: change dressing as ordered Methods: Demonstration, Explain/Verbal Responses: State content correctly Electronic Signature(s) Signed: 05/11/2016 4:11:15 PM By: Alejandro Mulling Entered By: Alejandro Mulling on 05/11/2016 13:10:55 Allender, Douglass L. ([id number]) -------------------------------------------------------------------------------- Wound Assessment Details Patient Name: Horsey,  Jakyrie L. Date of Service: 05/11/2016 12:45 PM Medical Record Number: [id number] Patient Account Number: [id number] Date of Birth/Sex: 10/31/1946 (70 y.o. 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 Wounding Event: Pressure Injury Wound Status: Open Date Acquired: 12/11/2015 Comorbid Anemia, Hypertension, Type II Weeks Of Treatment: 2 History: Diabetes Clustered Wound: No Photos Photo Uploaded By: Alejandro Mulling on 05/11/2016 15:38:43 Wound Measurements Length: (cm) 2 Width: (cm) 3 Depth: (cm) 0.3 Area: (cm) 4.712 Volume: (cm) 1.414 % Reduction in Area: 21% % Reduction in Volume: -18.6% Epithelialization: None Tunneling: No Undermining: No Wound Description Classification: Category/Stage II Foul Odor Diabetic Severity [address]): Grade 1 Wound Margin: Thickened Exudate Amount: Large Exudate Type: Serosanguineous Exudate Color: red, brown After Cleansing: No Wound Bed Granulation Amount: Medium (34-66%) Exposed Structure Granulation Quality: Red Fascia Exposed: No Necrotic Amount: Medium (34-66%) Fat Layer Exposed: No Luecke, Zeddie L. ([id number]) Necrotic Quality: Adherent Slough Tendon Exposed: No Muscle Exposed: No Joint Exposed: No Bone Exposed: No Limited to Skin Breakdown Periwound Skin Texture Texture Color No Abnormalities Noted: No No Abnormalities Noted: No Moisture Temperature / Pain No Abnormalities Noted: No Temperature: No Abnormality Moist: Yes Tenderness on Palpation: Yes Wound Preparation Ulcer Cleansing: Rinsed/Irrigated with Saline Topical Anesthetic Applied: Other: lidocaine 4%, Treatment Notes Wound #1 (Left Calcaneus) 1.  Cleansed with: Clean wound with Normal Saline 2. Anesthetic Topical Lidocaine 4% cream to wound bed prior to debridement 3. Peri-wound Care: Barrier cream 4. Dressing Applied: Prisma Ag 5. Secondary Dressing Applied ABD Pad Dry Gauze Foam Kerlix/Conform 7. Secured with Tape Notes netting Electronic Signature(s) Signed: 05/11/2016 4:11:15 PM By: Alejandro Mulling Entered By: Alejandro Mulling on 05/11/2016 13:03:50 Gorgas, Marselino L. ([id number]) -------------------------------------------------------------------------------- Wound Assessment Details Patient Name: Koppelman, Keddrick L. Date of Service: 05/11/2016 12:45 PM Medical Record Number: [id number] Patient Account Number: [id number] Date of Birth/Sex: [birth year] (70 y.o. Male) Treating RN: Phillis Haggis Primary Care Physician: Montez Morita, MONICA Other Clinician: Referring Physician: Montez Morita, MONICA Treating Physician/Extender: Maxwell Caul Weeks in  Treatment: 2 Wound Status Wound Number: 2 Primary Etiology: Pressure Ulcer Wound Location: Right Calcaneus - Lateral Secondary Diabetic Wound/Ulcer of the Lower Etiology: Extremity Wounding Event: Pressure Injury Wound Status: Open Date Acquired: 12/11/2015 Comorbid Anemia, Hypertension, Type II Weeks Of Treatment: 2 History: Diabetes Clustered Wound: No Photos Photo Uploaded By: Alejandro Mulling on 05/11/2016 15:38:44 Wound Measurements Length: (cm) 2.7 Width: (cm) 3.8 Depth: (cm) 0.5 Area: (cm) 8.058 Volume: (cm) 4.029 % Reduction in Area: 9.3% % Reduction in Volume: 9.3% Epithelialization: None Tunneling: No Undermining: No Wound Description Classification: Category/Stage IV Diabetic Severity [address]): Grade 1 Wound Margin: Thickened Exudate Amount: Large Exudate Type: Serosanguineous Exudate Color: red, brown Foul Odor After Cleansing: No Wound Bed Granulation Amount: Small (1-33%) Exposed Structure Granulation Quality: Red Fascia Exposed:  No Necrotic Amount: Large (67-100%) Fat Layer Exposed: No Ke, Zayven L. ([id number]) Necrotic Quality: Adherent Slough Tendon Exposed: Yes Muscle Exposed: Yes Necrosis of Muscle: No Joint Exposed: No Bone Exposed: Yes Periwound Skin Texture Texture Color No Abnormalities Noted: No No Abnormalities Noted: No Localized Edema: Yes Erythema: Yes Erythema Location: Circumferential Moisture No Abnormalities Noted: No Temperature / Pain Moist: Yes Temperature: No Abnormality Tenderness on Palpation: Yes Wound Preparation Ulcer Cleansing: Rinsed/Irrigated with Saline Topical Anesthetic Applied: Other: lidocaine 4%, Treatment Notes Wound #2 (Right, Lateral Calcaneus) 1. Cleansed with: Clean wound with Normal Saline 2. Anesthetic Topical Lidocaine 4% cream to wound bed prior to debridement 3. Peri-wound Care: Barrier cream 4. Dressing Applied: Prisma Ag 5. Secondary Dressing Applied ABD Pad Dry Gauze Foam Kerlix/Conform 7. Secured with Tape Notes netting Electronic Signature(s) Signed: 05/11/2016 4:11:15 PM By: Alejandro Mulling Entered By: Alejandro Mulling on 05/11/2016 13:07:46 Stebbins, Mazin L. ([id number]) -------------------------------------------------------------------------------- Vitals Details Patient Name: Gilleland, Mckoy L. Date of Service: 05/11/2016 12:45 PM Medical Record Number: [id number] Patient Account Number: [id number] Date of Birth/Sex: [birth year] (70 y.o. Male) Treating RN: Phillis Haggis Primary Care Physician: Montez Morita, MONICA Other Clinician: Referring Physician: Montez Morita, MONICA Treating Physician/Extender: Maxwell Caul Weeks in Treatment: 2 Vital Signs Time Taken: 12:54 Temperature (F): 97.8 Height (in): 65 Pulse (bpm): 80 Respiratory Rate (breaths/min): 18 Blood Pressure (mmHg): 111/54 Reference Range: 80 - 120 mg / dl Electronic Signature(s) Signed: 05/11/2016 4:11:15 PM By: Alejandro Mulling Entered By: Alejandro Mulling on  05/11/2016 12:54:58

## 2016-05-13 NOTE — Progress Notes (Signed)
[name], [name] ([id number]) Visit Report for 05/11/2016 Chief Complaint Document Details Patient Name: [name], [name] Date of Service: 05/11/2016 12:45 PM Medical Record Patient Account Number: [id number] Number: Treating RN: Phillis Haggisinkerton, Debi 22-Jun-1946 (70 y.o. Other Clinician: Date of Birth/Sex: Male) Treating ROBSON, Hill Primary Care Physician: Montez MoritaARTER, MONICA Physician/Extender: G Referring Physician: Kirt BoysARTER, MONICA Weeks in Treatment: 2 Information Obtained from: Patient Chief Complaint Patient is here for our review of bilateral heel ulcers for the last 5-6 months Electronic Signature(s) Signed: 05/12/2016 5:25:36 PM By: Baltazar Najjarobson, Luke MD Entered By: Baltazar Najjarobson, Deren on 05/11/2016 13:51:21 Saiki, Blease L. ([id number]) -------------------------------------------------------------------------------- Debridement Details Patient Name: [name], [name] Date of Service: 05/11/2016 12:45 PM Medical Record Patient Account Number: [id number] Number: Treating RN: Phillis Haggisinkerton, Debi 22-Jun-1946 (70 y.o. Other Clinician: Date of Birth/Sex: Male) Treating ROBSON, Oziel Primary Care Physician: Montez MoritaARTER, MONICA Physician/Extender: G Referring Physician: Montez MoritaARTER, MONICA Weeks in Treatment: 2 Debridement Performed for Wound #1 Left Calcaneus Assessment: Performed By: Physician Maxwell CaulOBSON, Loden G, MD Debridement: Debridement Pre-procedure Yes Verification/Time Out Taken: Start Time: 13:40 Pain Control: Lidocaine 4% Topical Solution Level: Skin/Subcutaneous Tissue Total Area Debrided (L x 2 (cm) x 3 (cm) = 6 (cm) W): Tissue and other Viable, Non-Viable, Exudate, Fibrin/Slough, Subcutaneous material debrided: Instrument: Curette Bleeding: Minimum Hemostasis Achieved: Pressure End Time: 13:42 Procedural Pain: 0 Post Procedural Pain: 0 Response to Treatment: Procedure was tolerated well Post Debridement Measurements of Total Wound Length: (cm) 2 Stage:  Category/Stage II Width: (cm) 3 Depth: (cm) 0.3 Volume: (cm) 1.414 Post Procedure Diagnosis Same as Pre-procedure Electronic Signature(s) Signed: 05/11/2016 4:11:15 PM By: Alejandro MullingPinkerton, Debra Signed: 05/12/2016 5:25:36 PM By: Baltazar Najjarobson, Renny MD Entered By: Baltazar Najjarobson, Lonzo on 05/11/2016 13:50:55 Guthmiller, Raghav L. ([id number]) -------------------------------------------------------------------------------- Debridement Details Patient Name: [name], [name] Date of Service: 05/11/2016 12:45 PM Medical Record Patient Account Number: [id number] Number: Treating RN: Phillis Haggisinkerton, Debi 22-Jun-1946 (70 y.o. Other Clinician: Date of Birth/Sex: Male) Treating ROBSON, Shahin Primary Care Physician: Montez MoritaARTER, MONICA Physician/Extender: G Referring Physician: Montez MoritaARTER, MONICA Weeks in Treatment: 2 Debridement Performed for Wound #2 Right,Lateral Calcaneus Assessment: Performed By: Physician Maxwell CaulOBSON, Ambrosio G, MD Debridement: Debridement Pre-procedure Yes Verification/Time Out Taken: Start Time: 13:38 Pain Control: Lidocaine 4% Topical Solution Level: Skin/Subcutaneous Tissue Total Area Debrided (L x 2.7 (cm) x 3.8 (cm) = 10.26 (cm) W): Tissue and other Viable, Non-Viable, Exudate, Fibrin/Slough, Subcutaneous material debrided: Instrument: Curette Bleeding: Minimum Hemostasis Achieved: Pressure End Time: 13:40 Procedural Pain: 0 Post Procedural Pain: 0 Response to Treatment: Procedure was tolerated well Post Debridement Measurements of Total Wound Length: (cm) 2.7 Stage: Category/Stage IV Width: (cm) 3.8 Depth: (cm) 0.5 Volume: (cm) 4.029 Post Procedure Diagnosis Same as Pre-procedure Electronic Signature(s) Signed: 05/11/2016 4:11:15 PM By: Alejandro MullingPinkerton, Debra Signed: 05/12/2016 5:25:36 PM By: Baltazar Najjarobson, Alex MD Entered By: Baltazar Najjarobson, Jastin on 05/11/2016 13:51:09 Bosques, Kastin L.  ([id number]) -------------------------------------------------------------------------------- HPI Details Patient Name: [name], [name] Date of Service: 05/11/2016 12:45 PM Medical Record Patient Account Number: [id number] Number: Treating RN: Phillis Haggisinkerton, Debi 22-Jun-1946 (70 y.o. Other Clinician: Date of Birth/Sex: Male) Treating ROBSON, Cindy Primary Care Physician: Montez MoritaARTER, MONICA Physician/Extender: G Referring Physician: Kirt BoysARTER, MONICA Weeks in Treatment: 2 History of Present Illness HPI Description: 04/27/16; this is a 70 year old man who is a [facility] [place] He is accompanied by a family friend today. She tells me that he has had problems with pressure ulcers since December 2016 largely at a time where he apparently had diabetic hypoglycemic episodes. Apparently these became so severe that  he had to be transferred to [facility] for operative debridement apparently bilaterally. I don't have any records from [facility] but per his friend they did not do imaging studies bone cultures arterial studies etc. Apparently the area just applying wet to dry dressings to these. The patient is a diabetic on insulin. His arterial studies here were noncompressible although his pulses could be dopplered. 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 At one point I note in his notes he was on hospice I'm not really sure whether that is true currently or not. 05/04/16; x-ray of the right heel done in his skilled facility was negative for significant bone pathology. Arterial studies also done in the facility revealed noncompressible vessels therefore no ABI could be calculated. His arterial ultrasound showed low velocities scattered atherosclerotic disease negative  for focal stenosis but monophasic waveforms bilaterally. The overall impression was "mild peripheral arterial disease" 05/11/16; culture last week of the right heel grew MRSA and group B strep. We started doxycycline today. Electronic Signature(s) Signed: 05/12/2016 5:25:36 PM By: Baltazar Najjar MD Entered By: Baltazar Najjar on 05/11/2016 13:53:02 Mallozzi, Marolyn Hammock ([id number]) -------------------------------------------------------------------------------- Physical Exam Details Patient Name: Barbone, Estevon L. Date of Service: 05/11/2016 12:45 PM Medical Record Patient Account Number: [id number] Number: Treating RN: Phillis Haggis October 23, 1946 (70 y.o. Other Clinician: Date of Birth/Sex: Male) Treating ROBSON, Mickle Primary Care Physician: Montez Morita, MONICA Physician/Extender: G Referring Physician: Montez Morita MONICA Weeks in Treatment: 2 Notes Wound exam; substantial wound areas over the Achilles aspect of his right greater than left calcaneus. No exposed bone today. Culture last week grew MRSA and group B strep there is no evidence of surrounding soft tissue infection. Electronic Signature(s) Signed: 05/12/2016 5:25:36 PM By: Baltazar Najjar MD Entered By: Baltazar Najjar on 05/11/2016 13:57:02 Rausch, Marolyn Hammock ([id number]) -------------------------------------------------------------------------------- Physician Orders Details Patient Name: Dettmer, Kashius L. Date of Service: 05/11/2016 12:45 PM Medical Record Patient Account Number: [id number] Number: Treating RN: Phillis Haggis 1946/05/09 (70 y.o. Other Clinician: Date of Birth/Sex: Male) Treating ROBSON, Brok Primary Care Physician: Montez Morita, MONICA Physician/Extender: G Referring Physician: Kirt Boys Weeks in Treatment: 2 Verbal / Phone Orders: Yes Clinician: Pinkerton, Debi Read Back and Verified: Yes Diagnosis Coding Wound Cleansing Wound #1 Left Calcaneus o Clean wound with Normal  Saline. Wound #2 Right,Lateral Calcaneus o Clean wound with Normal Saline. Anesthetic Wound #1 Left Calcaneus o Topical Lidocaine 4% cream applied to wound bed prior to debridement - for clinic use only Wound #2 Right,Lateral Calcaneus o Topical Lidocaine 4% cream applied to wound bed prior to debridement - for clinic use only Skin Barriers/Peri-Wound Care Wound #1 Left Calcaneus o Barrier cream Wound #2 Right,Lateral Calcaneus o Barrier cream Primary Wound Dressing Wound #1 Left Calcaneus o Prisma Ag - moisten with normal saline Wound #2 Right,Lateral Calcaneus o Prisma Ag - moisten with normal saline Secondary Dressing Wound #1 Left Calcaneus o ABD pad o Dry Gauze o Conform/Kerlix o Foam - tape and netting Helton, Aum L. ([id number]) Wound #2 Right,Lateral Calcaneus o ABD pad o Dry Gauze o Conform/Kerlix o Foam - tape and netting Dressing Change Frequency Wound #1 Left Calcaneus o Change dressing every day. Wound #2 Right,Lateral Calcaneus o Change dressing every day. Follow-up Appointments Wound #1 Left Calcaneus o Return Appointment in 1 week. Wound #2 Right,Lateral Calcaneus o Return Appointment in 1 week. Off-Loading Wound #1  Left Calcaneus o Heel suspension boot to: - please float the heels when pt is sitting with feet up or when pt is lying in bed. o Turn and reposition every 2 hours Wound #2 Right,Lateral Calcaneus o Heel suspension boot to: - please float the heels when pt is sitting with feet up or when pt is lying in bed. o Turn and reposition every 2 hours Additional Orders / Instructions Wound #1 Left Calcaneus o Increase protein intake. Wound #2 Right,Lateral Calcaneus o Increase protein intake. Medications-please add to medication list. Wound #1 Left Calcaneus o Other: - Vitamin C, Zinc, Multivitamin Wound #2 Right,Lateral Calcaneus o P.O. Antibiotics - Doxycycline o Other: - Vitamin  C, Zinc, Multivitamin Kowaleski, Senay L. ([id number]) Electronic Signature(s) Signed: 05/11/2016 4:11:15 PM By: Alejandro Mulling Signed: 05/12/2016 5:25:36 PM By: Baltazar Najjar MD Entered By: Alejandro Mulling on 05/11/2016 14:04:35 Shieh, Dung L. ([id number]) -------------------------------------------------------------------------------- Problem List Details Patient Name: Whittaker, Dakarri L. Date of Service: 05/11/2016 12:45 PM Medical Record Patient Account Number: [id number] Number: Treating RN: Phillis Haggis 12/19/1945 (70 y.o. 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 ([id number]) -------------------------------------------------------------------------------- Progress Note Details Patient Name: Schmall, Luiz L. Date of Service: 05/11/2016 12:45 PM Medical Record Patient Account Number: [id number] Number: Treating RN: Phillis Haggis 1945/12/20 (70 y.o. Other Clinician: Date of Birth/Sex: Male) Treating ROBSON, Lilton Primary Care Physician: Montez Morita, MONICA Physician/Extender: G Referring Physician: Kirt Boys Weeks in Treatment: 2 Subjective Chief Complaint Information obtained from Patient Patient is here for our review of bilateral heel  ulcers for the last 5-6 months History of Present Illness (HPI) 04/27/16; this is a 70 year old man who is a resident of [facility] in [place]. He is accompanied by a family friend today. She tells me that he has had problems with pressure ulcers since December 2016 largely at a time where he apparently had diabetic hypoglycemic episodes. Apparently these became so severe that he had to be transferred to [facility] for operative debridement apparently bilaterally. I don't have any records from [facility] but per his friend they did not do imaging studies bone cultures arterial studies etc. Apparently the area just applying wet to dry dressings to these. The patient is a diabetic on insulin. His arterial studies here were noncompressible although his pulses could be dopplered. 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 At one point I note in his notes he was on hospice I'm not really sure whether that is true currently or not. 05/04/16; x-ray of the right heel done in his skilled facility was negative for significant bone pathology. Arterial studies also done in the facility revealed noncompressible vessels therefore no ABI could be calculated. His arterial ultrasound showed low velocities scattered atherosclerotic disease negative for focal stenosis but monophasic waveforms bilaterally. The overall impression was "  mild peripheral arterial disease" 05/11/16; culture last week of the right heel grew MRSA and group B strep. We started doxycycline today. Objective Constitutional Vitals Time Taken: 12:54 PM, Height: 65 in, Temperature: 97.8 F, Pulse: 80 bpm, Respiratory Rate: 18 breaths/min, Blood Pressure: 111/54 mmHg. Integumentary (Hair, Skin) Wound #1 status is Open. Original cause  of wound was Pressure Injury. The wound is located on the Left Shanholtzer, Diontae L. ([id number]) Calcaneus. The wound measures 2cm length x 3cm width x 0.3cm depth; 4.712cm^2 area and 1.414cm^3 volume. The wound is limited to skin breakdown. There is no tunneling or undermining noted. There is a large amount of serosanguineous drainage noted. The wound margin is thickened. There is medium (34- 66%) red granulation within the wound bed. There is a medium (34-66%) amount of necrotic tissue within the wound bed including Adherent Slough. The periwound skin appearance exhibited: Moist. Periwound temperature was noted as No Abnormality. The periwound has tenderness on palpation. Wound #2 status is Open. Original cause of wound was Pressure Injury. The wound is located on the Right,Lateral Calcaneus. The wound measures 2.7cm length x 3.8cm width x 0.5cm depth; 8.058cm^2 area and 4.029cm^3 volume. There is bone, muscle, and tendon exposed. There is no tunneling or undermining noted. There is a large amount of serosanguineous drainage noted. The wound margin is thickened. There is small (1-33%) red granulation within the wound bed. There is a large (67-100%) amount of necrotic tissue within the wound bed including Adherent Slough. The periwound skin appearance exhibited: Localized Edema, Moist, Erythema. The surrounding wound skin color is noted with erythema which is circumferential. Periwound temperature was noted as No Abnormality. The periwound has tenderness on palpation. Assessment Active Problems ICD-10 E11.621 - Type 2 diabetes mellitus with foot ulcer E11.42 - Type 2 diabetes mellitus with diabetic polyneuropathy L89.614 - Pressure ulcer of right heel, stage 4 L89.623 - Pressure ulcer of left heel, stage 3 I35.0 - Nonrheumatic aortic (valve) stenosis Procedures Wound #1 Wound #1 is a Pressure Ulcer located on the Left Calcaneus . There was a Skin/Subcutaneous Tissue Debridement ([id number])  debridement with total area of 6 sq cm performed by Maxwell Caul, MD. with the following instrument(s): Curette to remove Viable and Non-Viable tissue/material including Exudate, Fibrin/Slough, and Subcutaneous after achieving pain control using Lidocaine 4% Topical Solution. A time out was conducted prior to the start of the procedure. A Minimum amount of bleeding was controlled with Pressure. The procedure was tolerated well with a pain level of 0 throughout and a pain level of 0 following the procedure. Post Debridement Measurements: 2cm length x 3cm width x 0.3cm depth; 1.414cm^3 volume. Post debridement Stage noted as Category/Stage II. Post procedure Diagnosis Wound #1: Same as Pre-Procedure Wound #2 Lardizabal, Margaret L. ([id number]) Wound #2 is a Pressure Ulcer located on the Right,Lateral Calcaneus . There was a Skin/Subcutaneous Tissue Debridement ([id number]) debridement with total area of 10.26 sq cm performed by Maxwell Caul, MD. with the following instrument(s): Curette to remove Viable and Non-Viable tissue/material including Exudate, Fibrin/Slough, and Subcutaneous after achieving pain control using Lidocaine 4% Topical Solution. A time out was conducted prior to the start of the procedure. A Minimum amount of bleeding was controlled with Pressure. The procedure was tolerated well with a pain level of 0 throughout and a pain level of 0 following the procedure. Post Debridement Measurements: 2.7cm length x 3.8cm width x 0.5cm depth; 4.029cm^3 volume. Post debridement Stage noted as Category/Stage IV. Post procedure Diagnosis Wound #2: Same as Pre-Procedure  Plan Wound Cleansing: Wound #1 Left Calcaneus: Clean wound with Normal Saline. Wound #2 Right,Lateral Calcaneus: Clean wound with Normal Saline. Anesthetic: Wound #1 Left Calcaneus: Topical Lidocaine 4% cream applied to wound bed prior to debridement - for clinic use only Wound #2 Right,Lateral Calcaneus: Topical  Lidocaine 4% cream applied to wound bed prior to debridement - for clinic use only Skin Barriers/Peri-Wound Care: Wound #1 Left Calcaneus: Barrier cream Wound #2 Right,Lateral Calcaneus: Barrier cream Primary Wound Dressing: Wound #1 Left Calcaneus: Prisma Ag - moisten with normal saline Wound #2 Right,Lateral Calcaneus: Prisma Ag - moisten with normal saline Secondary Dressing: Wound #1 Left Calcaneus: ABD pad Dry Gauze Conform/Kerlix Foam Wound #2 Right,Lateral Calcaneus: ABD pad Dry Gauze Conform/Kerlix Foam Dressing Change Frequency: Wound #1 Left Calcaneus: Gaster, Juana L. ([id number]) Change dressing every day. Wound #2 Right,Lateral Calcaneus: Change dressing every day. Follow-up Appointments: Wound #1 Left Calcaneus: Return Appointment in 1 week. Wound #2 Right,Lateral Calcaneus: Return Appointment in 1 week. Off-Loading: Wound #1 Left Calcaneus: Heel suspension boot to: - please float the heels when pt is sitting with feet up or when pt is lying in bed. Turn and reposition every 2 hours Wound #2 Right,Lateral Calcaneus: Heel suspension boot to: - please float the heels when pt is sitting with feet up or when pt is lying in bed. Turn and reposition every 2 hours Additional Orders / Instructions: Wound #1 Left Calcaneus: Increase protein intake. Wound #2 Right,Lateral Calcaneus: Increase protein intake. Medications-please add to medication list.: Wound #1 Left Calcaneus: Other: - Vitamin C, Zinc, Multivitamin Wound #2 Right,Lateral Calcaneus: P.O. Antibiotics - Doxycycline  po BID x 2 weeks Other: - Vitamin C, Zinc, Multivitamin The following medication(s) was prescribed: doxycycline hyclate oral 100 mg capsule 1 1 capsule oral BID starting 05/11/2016 #1 doxycycline for 2 weeks 100 twice a day #2 changed to Prisma instead of silver alginate #3 Apligraf through his Designer, industrial/product) Signed: 05/12/2016 5:25:36 PM By: Baltazar Najjar  MD Entered By: Baltazar Najjar on 05/11/2016 13:58:12 Myer, Jory L. ([id number]) -------------------------------------------------------------------------------- SuperBill Details Patient Name: Barcenas, Krystian L. Date of Service: 05/11/2016 Medical Record Patient Account Number: [id number] Number: Treating RN: Phillis Haggis 1946-05-27 (70 y.o. 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

## 2016-05-18 ENCOUNTER — Ambulatory Visit: Payer: Self-pay | Admitting: Internal Medicine

## 2016-05-19 ENCOUNTER — Other Ambulatory Visit
Admission: RE | Admit: 2016-05-19 | Discharge: 2016-05-19 | Disposition: A | Discharge status: Home / Self Care | Payer: PRIVATE HEALTH INSURANCE | Source: Ambulatory Visit | Attending: Internal Medicine | Admitting: Internal Medicine

## 2016-05-19 ENCOUNTER — Encounter: Payer: PRIVATE HEALTH INSURANCE | Attending: Internal Medicine | Admitting: Internal Medicine

## 2016-05-19 DIAGNOSIS — I350 Nonrheumatic aortic (valve) stenosis: Secondary | ICD-10-CM | POA: Diagnosis not present

## 2016-05-19 DIAGNOSIS — E1142 Type 2 diabetes mellitus with diabetic polyneuropathy: Secondary | ICD-10-CM | POA: Diagnosis not present

## 2016-05-19 DIAGNOSIS — K766 Portal hypertension: Secondary | ICD-10-CM | POA: Insufficient documentation

## 2016-05-19 DIAGNOSIS — X58XXXA Exposure to other specified factors, initial encounter: Secondary | ICD-10-CM | POA: Insufficient documentation

## 2016-05-19 DIAGNOSIS — S91301A Unspecified open wound, right foot, initial encounter: Secondary | ICD-10-CM | POA: Insufficient documentation

## 2016-05-19 DIAGNOSIS — K7460 Unspecified cirrhosis of liver: Secondary | ICD-10-CM | POA: Diagnosis not present

## 2016-05-19 DIAGNOSIS — L89614 Pressure ulcer of right heel, stage 4: Secondary | ICD-10-CM | POA: Insufficient documentation

## 2016-05-19 DIAGNOSIS — Z794 Long term (current) use of insulin: Secondary | ICD-10-CM | POA: Insufficient documentation

## 2016-05-19 DIAGNOSIS — E11621 Type 2 diabetes mellitus with foot ulcer: Secondary | ICD-10-CM | POA: Insufficient documentation

## 2016-05-19 DIAGNOSIS — K7290 Hepatic failure, unspecified without coma: Secondary | ICD-10-CM | POA: Insufficient documentation

## 2016-05-19 DIAGNOSIS — D61818 Other pancytopenia: Secondary | ICD-10-CM | POA: Diagnosis not present

## 2016-05-19 DIAGNOSIS — L89623 Pressure ulcer of left heel, stage 3: Secondary | ICD-10-CM | POA: Diagnosis not present

## 2016-05-20 NOTE — Progress Notes (Signed)
[name] ([id number]) Visit Report for 05/19/2016 Chief Complaint Document Details Patient Name: [name], [name] Date of Service: 05/19/2016 1:30 PM Medical Record Patient Account Number: [id number] Number: Treating RN: Phillis Haggis May 22, 1946 (70 y.o. Other Clinician: Date of Birth/Sex: Male) Treating Eustace Hur, Dwain Primary Care Physician: Montez Morita, MONICA Physician/Extender: G Referring Physician: Kirt Boys Weeks in Treatment: 3 Information Obtained from: Patient Chief Complaint Patient is here for our review of bilateral heel ulcers for the last 5-6 months Electronic Signature(s) Signed: 05/19/2016 5:53:31 PM By: Baltazar Najjar MD Entered By: Baltazar Najjar on 05/19/2016 17:27:48 Peruski, Evrett L. ([id number]) -------------------------------------------------------------------------------- Debridement Details Patient Name: Rueda, Kimi L. Date of Service: 05/19/2016 1:30 PM Medical Record Patient Account Number: [id number] Number: Treating RN: Phillis Haggis September 16, 1946 (70 y.o. Other Clinician: Date of Birth/Sex: Male) Treating Tasheena Wambolt, Kashaun Primary Care Physician: Montez Morita, MONICA Physician/Extender: G Referring Physician: Montez Morita MONICA Weeks in Treatment: 3 Debridement Performed for Wound #1 Left Calcaneus Assessment: Performed By: Physician Maxwell Caul, MD Debridement: Debridement Pre-procedure Yes Verification/Time Out Taken: Start Time: 14:03 Pain Control: Lidocaine 4% Topical Solution Level: Skin/Subcutaneous Tissue Total Area Debrided (L x 1.8 (cm) x 2.3 (cm) = 4.14 (cm) W): Tissue and other Viable, Non-Viable, Exudate, Fibrin/Slough, Subcutaneous material debrided: Instrument: Curette Bleeding: Minimum Hemostasis Achieved: Pressure End Time: 14:05 Procedural Pain: 0 Post Procedural Pain: 0 Response to Treatment: Procedure was tolerated well Post Debridement Measurements of Total Wound Length: (cm) 1.8 Stage:  Category/Stage II Width: (cm) 2.3 Depth: (cm) 0.8 Volume: (cm) 2.601 Post Procedure Diagnosis Same as Pre-procedure Electronic Signature(s) Signed: 05/19/2016 5:53:31 PM By: Baltazar Najjar MD Signed: 05/19/2016 5:55:43 PM By: Alejandro Mulling Entered By: Baltazar Najjar on 05/19/2016 17:27:18 Nobile, Garmon L. ([id number]) -------------------------------------------------------------------------------- Debridement Details Patient Name: Alkins, Thadd L. Date of Service: 05/19/2016 1:30 PM Medical Record Patient Account Number: [id number] Number: Treating RN: Phillis Haggis 12/16/1945 (70 y.o. Other Clinician: Date of Birth/Sex: Male) Treating Tanika Bracco, Tell Primary Care Physician: Montez Morita, MONICA Physician/Extender: G Referring Physician: Montez Morita MONICA Weeks in Treatment: 3 Debridement Performed for Wound #2 Right,Lateral Calcaneus Assessment: Performed By: Physician Maxwell Caul, MD Debridement: Debridement Pre-procedure Yes Verification/Time Out Taken: Start Time: 14:00 Pain Control: Lidocaine 4% Topical Solution Level: Skin/Subcutaneous Tissue Total Area Debrided (L x 2.5 (cm) x 4 (cm) = 10 (cm) W): Tissue and other Viable, Non-Viable, Exudate, Fibrin/Slough, Subcutaneous material debrided: Instrument: Blade, Forceps Bleeding: Minimum Hemostasis Achieved: Pressure End Time: 14:02 Procedural Pain: 0 Post Procedural Pain: 0 Response to Treatment: Procedure was tolerated well Post Debridement Measurements of Total Wound Length: (cm) 2.5 Stage: Category/Stage IV Width: (cm) 4 Depth: (cm) 0.9 Volume: (cm) 7.069 Post Procedure Diagnosis Same as Pre-procedure Electronic Signature(s) Signed: 05/19/2016 5:53:31 PM By: Baltazar Najjar MD Signed: 05/19/2016 5:55:43 PM By: Alejandro Mulling Entered By: Baltazar Najjar on 05/19/2016 17:27:31 Recinos, Kasper L. ([id number]) -------------------------------------------------------------------------------- HPI  Details Patient Name: Vinas, Author L. Date of Service: 05/19/2016 1:30 PM Medical Record Patient Account Number: [id number] Number: Treating RN: Phillis Haggis November 19, 1946 (70 y.o. Other Clinician: Date of Birth/Sex: Male) Treating Katelen Luepke, Jarel Primary Care Physician: Montez Morita, MONICA Physician/Extender: G Referring Physician: Kirt Boys Weeks in Treatment: 3 History of Present Illness HPI Description: 04/27/16; this is a 70 year old man who is a resident of [facility] in [place]. He is accompanied by a family friend today. She tells me that he has had problems with pressure ulcers since December 2016 largely at a time where he apparently had diabetic hypoglycemic episodes. Apparently these became so severe  that he had to be transferred to [facility] for operative debridement apparently bilaterally. I don't have any records from [facility] but per his friend they did not do imaging studies bone cultures arterial studies etc. Apparently the area just applying wet to dry dressings to these. The patient is a diabetic on insulin. His arterial studies here were noncompressible although his pulses could be dopplered. 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 At one point I note in his notes he was on hospice I'm not really sure whether that is true currently or not. 05/04/16; x-ray of the right heel done in his skilled facility was negative for significant bone pathology. Arterial studies also done in the facility revealed noncompressible vessels therefore no ABI could be calculated. His arterial ultrasound showed low velocities scattered atherosclerotic disease negative for focal stenosis but monophasic waveforms bilaterally. The overall impression was "mild  peripheral arterial disease" 05/11/16; culture last week of the right heel grew MRSA and group B strep. We started doxycycline today. 05/19/16; I started the patient on doxycycline 8 days ago. He has not tolerated this well secondary to gastric irritation. He is also complaining of increasing pain in the right heel. Electronic Signature(s) Signed: 05/19/2016 5:53:31 PM By: Baltazar Najjar MD Entered By: Baltazar Najjar on 05/19/2016 17:28:45 Leggio, Marolyn Hammock ([id number]) -------------------------------------------------------------------------------- Physical Exam Details Patient Name: Nemetz, Rani L. Date of Service: 05/19/2016 1:30 PM Medical Record Patient Account Number: [id number] Number: Treating RN: Phillis Haggis 02-28-1946 (70 y.o. Other Clinician: Date of Birth/Sex: Male) Treating Lazar Tierce, Lin Primary Care Physician: Montez Morita, MONICA Physician/Extender: G Referring Physician: Montez Morita, MONICA Weeks in Treatment: 3 Cardiovascular Pedal pulses palpable and strong bilaterally.. Notes Wound exam; wound exam substantial wound area over the Achilles right greater than left calcaneus. The area on the right has surrounding erythema and especially inferiorly quite a bit of tenderness. The base of the wounds themselves really don't look that ominous in the tissue really as seen in isolation would probably be ready for the Apligraf that we had for him today however I am concerned about the possibility of coexistent infection in the right heel that we have not controlled well with the doxycycline. Electronic Signature(s) Signed: 05/19/2016 5:53:31 PM By: Baltazar Najjar MD Entered By: Baltazar Najjar on 05/19/2016 17:37:24 Mackel, [name] -------------------------------------------------------------------------------- Physician Orders Details Patient Name: Vines, Dabney L. Date of Service: 05/19/2016 1:30 PM Medical Record Patient Account Number:  [id number] Number: Treating RN: Phillis Haggis 1946-05-11 (70 y.o. Other Clinician: Date of Birth/Sex: Male) Treating Hutton Pellicane, Sani Primary Care Physician: Montez Morita, MONICA Physician/Extender: G Referring Physician: Kirt Boys Weeks in Treatment: 3 Verbal / Phone Orders: Yes Clinician: Pinkerton, Debi Read Back and Verified: Yes Diagnosis Coding Wound Cleansing Wound #1 Left Calcaneus o Clean wound with Normal Saline. Wound #2 Right,Lateral Calcaneus o Clean wound with Normal Saline. Anesthetic Wound #1 Left Calcaneus o Topical Lidocaine 4% cream applied to wound bed prior to debridement - for clinic use only Wound #2 Right,Lateral Calcaneus o Topical Lidocaine 4% cream applied to wound bed prior to debridement - for clinic use only Skin Barriers/Peri-Wound Care Wound #1 Left Calcaneus o Barrier cream Wound #2 Right,Lateral Calcaneus o Barrier cream Primary Wound Dressing Wound #1 Left Calcaneus o Aquacel Ag Wound #2 Right,Lateral Calcaneus o Aquacel Ag Secondary Dressing Wound #1 Left Calcaneus o ABD pad o Dry  Gauze o Conform/Kerlix o Foam - tape and netting Defelice, Dierre L. ([id number]) Wound #2 Right,Lateral Calcaneus o ABD pad o Dry Gauze o Conform/Kerlix o Foam - tape and netting Dressing Change Frequency Wound #1 Left Calcaneus o Change dressing every day. Wound #2 Right,Lateral Calcaneus o Change dressing every day. Follow-up Appointments Wound #1 Left Calcaneus o Return Appointment in 1 week. Wound #2 Right,Lateral Calcaneus o Return Appointment in 1 week. Off-Loading Wound #1 Left Calcaneus o Heel suspension boot to: - please float the heels when pt is sitting with feet up or when pt is lying in bed. o Turn and reposition every 2 hours Wound #2 Right,Lateral Calcaneus o Heel suspension boot to: - please float the heels when pt is sitting with feet up or when pt is lying in bed. o  Turn and reposition every 2 hours Additional Orders / Instructions Wound #1 Left Calcaneus o Increase protein intake. Wound #2 Right,Lateral Calcaneus o Increase protein intake. Medications-please add to medication list. Wound #1 Left Calcaneus o Other: - Vitamin C, Zinc, Multivitamin Wound #2 Right,Lateral Calcaneus o P.O. Antibiotics - ZYVOX 600mg  po q2 hr x 10 days o Other: - Vitamin C, Zinc, Multivitamin Gutkowski, Ezzard L. ([id number]) Electronic Signature(s) Signed: 05/19/2016 5:53:31 PM By: Baltazar Najjar MD Signed: 05/19/2016 5:55:43 PM By: Alejandro Mulling Entered By: Alejandro Mulling on 05/19/2016 17:10:12 Maden, Jaydee L. ([id number]) -------------------------------------------------------------------------------- Problem List Details Patient Name: Hildebrant, Rainen L. Date of Service: 05/19/2016 1:30 PM Medical Record Patient Account Number: [id number] Number: Treating RN: Phillis Haggis Aug 27, 1946 (70 y.o. 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 ([id number]) -------------------------------------------------------------------------------- Progress Note Details Patient Name: Milnes, Jahan L. Date of Service: 05/19/2016 1:30 PM Medical Record Patient  Account Number: [id number] Number: Treating RN: Phillis Haggis 07-27-1946 (70 y.o. Other Clinician: Date of Birth/Sex: Male) Treating Sajjad Honea, Jayvin Primary Care Physician: Montez Morita, MONICA Physician/Extender: G Referring Physician: Kirt Boys Weeks in Treatment: 3 Subjective Chief Complaint Information obtained from Patient Patient is here for our review of bilateral heel ulcers for the last 5-6 months History of Present Illness (HPI) 04/27/16; this is a 70 year old man who is a resident of [facility] in [place]. He is accompanied by a family friend today. She tells me that he has had problems with pressure ulcers since December 2016 largely at a time where he apparently had diabetic hypoglycemic episodes. Apparently these became so severe that he had to be transferred to [facility] for operative debridement apparently bilaterally. I don't have any records from [facility] but per his friend they did not do imaging studies bone cultures arterial studies etc. Apparently the area just applying wet to dry dressings to these. The patient is a diabetic on insulin. His arterial studies here were noncompressible although his pulses could be dopplered. The patient has a lot of other medical issues including cirrhosis of the liver with portal hypertension, chronic pancytopenia, chronic presumably hepatic encephalopathy, moderate to severe  aortic stenosis an echocardiogram in April 2016. At one point I note in his notes he was on hospice I'm not really sure whether that is true currently or not. 05/04/16; x-ray of the right heel done in his skilled facility was negative for significant bone pathology. Arterial studies also done in the facility revealed noncompressible vessels therefore no ABI could be calculated. His arterial ultrasound showed low velocities scattered atherosclerotic disease negative for focal stenosis but monophasic waveforms bilaterally. The  overall impression was "mild peripheral arterial disease" 05/11/16; culture last week of the right heel grew MRSA and group B strep. We started doxycycline today. 05/19/16; I started the patient on doxycycline 8 days ago. He has not tolerated this well secondary to gastric irritation. He is also complaining of increasing pain in the right heel. Objective Constitutional Vitals Time Taken: 1:36 PM, Height: 65 in, Temperature: 97.5 F, Pulse: 79 bpm, Respiratory Rate: 18 breaths/min, Blood Pressure: 112/56 mmHg. Juul, Nilton L. ([id number]) Cardiovascular Pedal pulses palpable and strong bilaterally.. General Notes: Wound exam; wound exam substantial wound area over the Achilles right greater than left calcaneus. The area on the right has surrounding erythema and especially inferiorly quite a bit of tenderness. The base of the wounds themselves really don't look that ominous in the tissue really as seen in isolation would probably be ready for the Apligraf that we had for him today however I am concerned about the possibility of coexistent infection in the right heel that we have not controlled well with the doxycycline. Integumentary (Hair, Skin) Wound #1 status is Open. Original cause of wound was Pressure Injury. The wound is located on the Left Calcaneus. The wound measures 1.8cm length x 2.3cm width x 0.8cm depth; 3.252cm^2 area and 2.601cm^3 volume. The wound is limited to skin breakdown. There is no tunneling or undermining noted. There is a large amount of serosanguineous drainage noted. The wound margin is thickened. There is small (1-33%) red granulation within the wound bed. There is a large (67-100%) amount of necrotic tissue within the wound bed including Eschar and Adherent Slough. The periwound skin appearance exhibited: Moist. Periwound temperature was noted as No Abnormality. The periwound has tenderness on palpation. Wound #2 status is Open. Original cause of wound was Pressure  Injury. The wound is located on the Right,Lateral Calcaneus. The wound measures 2.5cm length x 4cm width x 0.9cm depth; 7.854cm^2 area and 7.069cm^3 volume. There is bone, muscle, and tendon exposed. There is no tunneling or undermining noted. There is a large amount of serosanguineous drainage noted. The wound margin is thickened. There is medium (34-66%) red granulation within the wound bed. There is a medium (34-66%) amount of necrotic tissue within the wound bed including Adherent Slough. The periwound skin appearance exhibited: Localized Edema, Moist, Erythema. The surrounding wound skin color is noted with erythema which is circumferential. Periwound temperature was noted as No Abnormality. The periwound has tenderness on palpation. Assessment Active Problems ICD-10 E11.621 - Type 2 diabetes mellitus with foot ulcer E11.42 - Type 2 diabetes mellitus with diabetic polyneuropathy L89.614 - Pressure ulcer of right heel, stage 4 L89.623 - Pressure ulcer of left heel, stage 3 I35.0 - Nonrheumatic aortic (valve) stenosis Procedures Decandia, Teige L. ([id number]) Wound #1 Wound #1 is a Pressure Ulcer located on the Left Calcaneus . There was a Skin/Subcutaneous Tissue Debridement ([id number]) debridement with total area of 4.14 sq cm performed by Maxwell Caul, MD. with the following instrument(s): Curette to remove Viable and Non-Viable tissue/material including Exudate, Fibrin/Slough,  and Subcutaneous after achieving pain control using Lidocaine 4% Topical Solution. A time out was conducted prior to the start of the procedure. A Minimum amount of bleeding was controlled with Pressure. The procedure was tolerated well with a pain level of 0 throughout and a pain level of 0 following the procedure. Post Debridement Measurements: 1.8cm length x 2.3cm width x 0.8cm depth; 2.601cm^3 volume. Post debridement Stage noted as Category/Stage II. Post procedure Diagnosis Wound #1: Same as  Pre-Procedure Wound #2 Wound #2 is a Pressure Ulcer located on the Right,Lateral Calcaneus . There was a Skin/Subcutaneous Tissue Debridement ([id number]) debridement with total area of 10 sq cm performed by Maxwell Caul, MD. with the following instrument(s): Blade and Forceps to remove Viable and Non-Viable tissue/material including Exudate, Fibrin/Slough, and Subcutaneous after achieving pain control using Lidocaine 4% Topical Solution. A time out was conducted prior to the start of the procedure. A Minimum amount of bleeding was controlled with Pressure. The procedure was tolerated well with a pain level of 0 throughout and a pain level of 0 following the procedure. Post Debridement Measurements: 2.5cm length x 4cm width x 0.9cm depth; 7.069cm^3 volume. Post debridement Stage noted as Category/Stage IV. Post procedure Diagnosis Wound #2: Same as Pre-Procedure Plan Wound Cleansing: Wound #1 Left Calcaneus: Clean wound with Normal Saline. Wound #2 Right,Lateral Calcaneus: Clean wound with Normal Saline. Anesthetic: Wound #1 Left Calcaneus: Topical Lidocaine 4% cream applied to wound bed prior to debridement - for clinic use only Wound #2 Right,Lateral Calcaneus: Topical Lidocaine 4% cream applied to wound bed prior to debridement - for clinic use only Skin Barriers/Peri-Wound Care: Wound #1 Left Calcaneus: Barrier cream Wound #2 Right,Lateral Calcaneus: Barrier cream Primary Wound Dressing: Wound #1 Left Calcaneus: Aquacel Ag Wound #2 Right,Lateral Calcaneus: Donahue, Amanda L. ([id number]) Aquacel Ag Secondary Dressing: Wound #1 Left Calcaneus: ABD pad Dry Gauze Conform/Kerlix Foam - tape and netting Wound #2 Right,Lateral Calcaneus: ABD pad Dry Gauze Conform/Kerlix Foam - tape and netting Dressing Change Frequency: Wound #1 Left Calcaneus: Change dressing every day. Wound #2 Right,Lateral Calcaneus: Change dressing every day. Follow-up Appointments: Wound #1  Left Calcaneus: Return Appointment in 1 week. Wound #2 Right,Lateral Calcaneus: Return Appointment in 1 week. Off-Loading: Wound #1 Left Calcaneus: Heel suspension boot to: - please float the heels when pt is sitting with feet up or when pt is lying in bed. Turn and reposition every 2 hours Wound #2 Right,Lateral Calcaneus: Heel suspension boot to: - please float the heels when pt is sitting with feet up or when pt is lying in bed. Turn and reposition every 2 hours Additional Orders / Instructions: Wound #1 Left Calcaneus: Increase protein intake. Wound #2 Right,Lateral Calcaneus: Increase protein intake. Medications-please add to medication list.: Wound #1 Left Calcaneus: Other: - Vitamin C, Zinc, Multivitamin Wound #2 Right,Lateral Calcaneus: P.O. Antibiotics - ZYVOX 600mg  po q2 hr x 10 days Other: - Vitamin C, Zinc, Multivitamin o Larrivee, [name] ([id number]) #1 I was not comfortable placing the Apligraf on the right heel due to the coexistent cellulitis #2 culture I did of drainage from this wound showed MRSA and group B strep. He is not tolerating the doxycycline well and the erythema around the wound looks worse. I have asked for generic linezolid 600 twice a day for 10 days the alternative being IV vancomycin. Plane exray did not show osteomyelitis #3 the left heel really doesn't look all that bad although it is a deep wound with reasonably healthy-looking muscle underneath. #4 both  areas were debridement of surface slough and nonviable subcutaneous tissue. #5 I have used aquacel AG to the wound, kerlix change daily. Electronic Signature(s) Signed: 05/19/2016 5:53:31 PM By: Baltazar Najjarobson, Random MD Entered By: Baltazar Najjarobson, Garren on 05/19/2016 17:40:18 Givler, [name] ([id number]) -------------------------------------------------------------------------------- SuperBill Details Patient Name: Corbit, Herschell L. Date of Service: 05/19/2016 Medical Record Patient Account Number:  [id number] Number: Treating RN: Phillis Haggisinkerton, Debi 03-16-1946 (70 y.o. 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, stage 4 L89.623 Pressure ulcer of left heel, stage 3 I35.0 Nonrheumatic aortic (valve) stenosis Facility Procedures CPT4 Code: [id number] Description: 11042 - DEB SUBQ TISSUE 20 SQ CM/< ICD-10 Description Diagnosis 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

## 2016-05-20 NOTE — Progress Notes (Signed)
[name] ([id number]) Visit Report for 05/19/2016 Arrival Information Details Patient Name: [name], [name] Date of Service: 05/19/2016 1:30 PM Medical Record Number: [id number] Patient Account Number: [id number] Date of Birth/Sex: March 12, 1946 (70 y.o. Male) Treating RN: Phillis Haggis Primary Care Physician: Montez Morita, MONICA Other Clinician: Referring Physician: Montez Morita, MONICA Treating Physician/Extender: Maxwell Caul Weeks in Treatment: 3 Visit Information History Since Last Visit All ordered tests and consults were completed: No Patient Arrived: Wheel Chair Added or deleted any medications: No Arrival Time: 13:33 Any new allergies or adverse reactions: No Accompanied By: sister Had a fall or experienced change in No Transfer Assistance: EasyPivot Patient activities of daily living that may affect Lift risk of falls: Patient Identification Verified: Yes Signs or symptoms of abuse/neglect since last No Secondary Verification Process Yes visito Completed: Hospitalized since last visit: No Patient Requires Transmission- No Pain Present Now: No Based Precautions: Patient Has Alerts: Yes Patient Alerts: DM II bil legs non- compressible Electronic Signature(s) Signed: 05/19/2016 5:55:43 PM By: Alejandro Mulling Entered By: Alejandro Mulling on 05/19/2016 13:34:54 Avitabile, Cody L. ([id number]) -------------------------------------------------------------------------------- Encounter Discharge Information Details Patient Name: [name], [name] Date of Service: 05/19/2016 1:30 PM Medical Record Number: [id number] Patient Account Number: [id number] Date of Birth/Sex: 02/10/1946 (70 y.o. Male) Treating RN: Phillis Haggis Primary Care Physician: Montez Morita, MONICA Other Clinician: Referring Physician: Montez Morita, MONICA Treating Physician/Extender: Altamese [place] in Treatment: 3 Encounter Discharge Information Items Discharge Pain Level: 0 Discharge Condition:  Stable Ambulatory Status: Wheelchair Discharge Destination: Nursing Home Transportation: Other Accompanied By: sister Schedule Follow-up Appointment: Yes Medication Reconciliation completed Yes and provided to Patient/Care Zaydan Papesh: Provided on Clinical Summary of Care: 05/19/2016 Form Type Recipient Paper Patient MS Electronic Signature(s) Signed: 05/19/2016 2:30:57 PM By: Gwenlyn Perking Entered By: Gwenlyn Perking on 05/19/2016 14:30:57 Detwiler, Selvin L. ([id number]) -------------------------------------------------------------------------------- Lower Extremity Assessment Details Patient Name: Tibbitts, [name] Date of Service: 05/19/2016 1:30 PM Medical Record Number: [id number] Patient Account Number: [id number] Date of Birth/Sex: [birth year] (70 y.o. Male) Treating RN: Phillis Haggis Primary Care Physician: Montez Morita, MONICA Other Clinician: Referring Physician: Montez Morita, MONICA Treating Physician/Extender: Maxwell Caul Weeks in Treatment: 3 Vascular Assessment Pulses: Posterior Tibial Dorsalis Pedis Palpable: [Left:Yes] [Right:Yes] Extremity colors, hair growth, and conditions: Temperature of Extremity: [Left:Warm] [Right:Warm] Capillary Refill: [Left:< 3 seconds] [Right:< 3 seconds] Electronic Signature(s) Signed: 05/19/2016 5:55:43 PM By: Alejandro Mulling Entered By: Alejandro Mulling on 05/19/2016 13:37:12 Ramaker, Bevan L. ([id number]) -------------------------------------------------------------------------------- Multi Wound Chart Details Patient Name: Silvester, [name] Date of Service: 05/19/2016 1:30 PM Medical Record Number: [id number] Patient Account Number: [id number] Date of Birth/Sex: 1946-04-06 (70 y.o. Male) Treating RN: Phillis Haggis Primary Care Physician: Montez Morita, MONICA Other Clinician: Referring Physician: Montez Morita, MONICA Treating Physician/Extender: Maxwell Caul Weeks in Treatment: 3 Vital Signs Height(in): 65 Pulse(bpm): 79 Weight(lbs): Blood  Pressure 112/56 (mmHg): Body Mass Index(BMI): Temperature(F): 97.5 Respiratory Rate 18 (breaths/min): Photos: [1:No Photos] [2:No Photos] [N/A:N/A] Wound Location: [1:Left Calcaneus] [2:Right Calcaneus - Lateral] [N/A:N/A] Wounding Event: [1:Pressure Injury] [2:Pressure Injury] [N/A:N/A] Primary Etiology: [1:Pressure Ulcer] [2:Pressure Ulcer] [N/A:N/A] Secondary Etiology: [1:Diabetic Wound/Ulcer of the Lower Extremity] [2:Diabetic Wound/Ulcer of the Lower Extremity] [N/A:N/A] Comorbid History: [1:Anemia, Hypertension, Type II Diabetes] [2:Anemia, Hypertension, Type II Diabetes] [N/A:N/A] Date Acquired: [1:12/11/2015] [2:12/11/2015] [N/A:N/A] Weeks of Treatment: [1:3] [2:3] [N/A:N/A] Wound Status: [1:Open] [2:Open] [N/A:N/A] Measurements L x W x D 1.8x2.3x0.8 [2:2.5x4x0.9] [N/A:N/A] (cm) Area (cm) : [1:3.252] [2:7.854] [N/A:N/A] Volume (cm) : [1:2.601] [2:7.069] [N/A:N/A] % Reduction in Area: [1:45.40%] [2:11.60%] [N/A:N/A] % Reduction in Volume: -118.20% [2:-59.20%] [N/A:N/A]  Classification: [1:Category/Stage II] [2:Category/Stage IV] [N/A:N/A] HBO Classification: [1:Grade 1] [2:Grade 1] [N/A:N/A] Exudate Amount: [1:Large] [2:Large] [N/A:N/A] Exudate Type: [1:Serosanguineous] [2:Serosanguineous] [N/A:N/A] Exudate Color: [1:red, brown] [2:red, brown] [N/A:N/A] Wound Margin: [1:Thickened] [2:Thickened] [N/A:N/A] Granulation Amount: [1:Small (1-33%)] [2:Medium (34-66%)] [N/A:N/A] Granulation Quality: [1:Red] [2:Red] [N/A:N/A] Necrotic Amount: [1:Large (67-100%)] [2:Medium (34-66%)] [N/A:N/A] Necrotic Tissue: [1:Eschar, Adherent Slough] [2:Adherent Slough] [N/A:N/A] Exposed Structures: [1:Fascia: No Fat: No] [2:Tendon: Yes Muscle: Yes] [N/A:N/A] Tendon: No Bone: Yes Muscle: No Fascia: No Joint: No Fat: No Bone: No Joint: No Limited to Skin Breakdown Epithelialization: None None N/A Periwound Skin Texture: No Abnormalities Noted Edema: Yes N/A Periwound Skin Moist:  Yes Moist: Yes N/A Moisture: Periwound Skin Color: No Abnormalities Noted Erythema: Yes N/A Erythema Location: N/A Circumferential N/A Temperature: No Abnormality No Abnormality N/A Tenderness on Yes Yes N/A Palpation: Wound Preparation: Ulcer Cleansing: Ulcer Cleansing: N/A Rinsed/Irrigated with Rinsed/Irrigated with Saline Saline Topical Anesthetic Topical Anesthetic Applied: Other: lidocaine Applied: Other: lidocaine 4% 4% Treatment Notes Electronic Signature(s) Signed: 05/19/2016 5:55:43 PM By: Alejandro MullingPinkerton, Debra Entered By: Alejandro MullingPinkerton, Debra on 05/19/2016 13:49:58 Lancon, Warden Elbert EwingsL. ([id number]) -------------------------------------------------------------------------------- Multi-Disciplinary Care Plan Details Patient Name: Banfield, Joanthan L. Date of Service: 05/19/2016 1:30 PM Medical Record Number: [id number] Patient Account Number: [id number] Date of Birth/Sex: 1946-08-15 101(70 y.o. Male) Treating RN: Phillis HaggisPinkerton, Debi Primary Care Physician: Montez MoritaARTER, MONICA Other Clinician: Referring Physician: Montez MoritaARTER, MONICA Treating Physician/Extender: Altamese CarolinaOBSON, Aimar G Weeks in Treatment: 3 Active Inactive Abuse / Safety / Falls / Self Care Management Nursing Diagnoses: Potential for falls Goals: Patient will remain injury free Date Initiated: 04/27/2016 Goal Status: Active Interventions: Assess fall risk on admission and as needed Assess self care needs on admission and as needed Notes: Nutrition Nursing Diagnoses: Imbalanced nutrition Goals: Patient/caregiver agrees to and verbalizes understanding of need to use nutritional supplements and/or vitamins as prescribed Date Initiated: 04/27/2016 Goal Status: Active Interventions: Assess patient nutrition upon admission and as needed per policy Notes: Orientation to the Wound Care Program Nursing Diagnoses: Knowledge deficit related to the wound healing center program Goals: Double, Alante Elbert EwingsL. ([id number]) Patient/caregiver will  verbalize understanding of the Wound Healing Center Program Date Initiated: 04/27/2016 Goal Status: Active Interventions: Provide education on orientation to the wound center Notes: Pain, Acute or Chronic Nursing Diagnoses: Pain, acute or chronic: actual or potential Potential alteration in comfort, pain Goals: Patient will verbalize adequate pain control and receive pain control interventions during procedures as needed Date Initiated: 04/27/2016 Goal Status: Active Patient/caregiver will verbalize adequate pain control between visits Date Initiated: 04/27/2016 Goal Status: Active Interventions: Assess comfort goal upon admission Complete pain assessment as per visit requirements Notes: Pressure Nursing Diagnoses: Knowledge deficit related to causes and risk factors for pressure ulcer development Knowledge deficit related to management of pressures ulcers Goals: Patient will remain free from development of additional pressure ulcers Date Initiated: 04/27/2016 Goal Status: Active Interventions: Assess: immobility, friction, shearing, incontinence upon admission and as needed Assess offloading mechanisms upon admission and as needed Assess potential for pressure ulcer upon admission and as needed Notes: Ratterman, Ashleigh L. ([id number]) Wound/Skin Impairment Nursing Diagnoses: Impaired tissue integrity Goals: Ulcer/skin breakdown will have a volume reduction of 30% by week 4 Date Initiated: 04/27/2016 Goal Status: Active Ulcer/skin breakdown will have a volume reduction of 50% by week 8 Date Initiated: 04/27/2016 Goal Status: Active Ulcer/skin breakdown will have a volume reduction of 80% by week 12 Date Initiated: 04/27/2016 Goal Status: Active Interventions: Assess patient/caregiver ability to perform ulcer/skin care regimen upon admission and as needed Assess ulceration(s) every  visit Notes: Electronic Signature(s) Signed: 05/19/2016 5:55:43 PM By: Alejandro Mulling Entered  By: Alejandro Mulling on 05/19/2016 13:49:52 Guzzi, Jeffrey L. ([id number]) -------------------------------------------------------------------------------- Pain Assessment Details Patient Name: Shadrick, Linley L. Date of Service: 05/19/2016 1:30 PM Medical Record Number: [id number] Patient Account Number: [id number] Date of Birth/Sex: 27-Apr-1946 (70 y.o. Male) Treating RN: Phillis Haggis Primary Care Physician: Montez Morita, MONICA Other Clinician: Referring Physician: Montez Morita, MONICA Treating Physician/Extender: Maxwell Caul Weeks in Treatment: 3 Active Problems Location of Pain Severity and Description of Pain Patient Has Paino No Site Locations Pain Management and Medication Current Pain Management: Electronic Signature(s) Signed: 05/19/2016 5:55:43 PM By: Alejandro Mulling Entered By: Alejandro Mulling on 05/19/2016 13:35:00 Hillmann, Marolyn Hammock ([id number]) -------------------------------------------------------------------------------- Patient/Caregiver Education Details Patient Name: Rohleder, Tyronne L. Date of Service: 05/19/2016 1:30 PM Medical Record Number: [id number] Patient Account Number: [id number] Date of Birth/Gender: Mar 24, 1946 (70 y.o. Male) Treating RN: Phillis Haggis Primary Care Physician: Montez Morita, MONICA Other Clinician: Referring Physician: Montez Morita, MONICA Treating Physician/Extender: Altamese [place] in Treatment: 3 Education Assessment Education Provided To: Patient Education Topics Provided Wound/Skin Impairment: Handouts: Other: change dressing as ordered Methods: Demonstration, Explain/Verbal Responses: State content correctly Electronic Signature(s) Signed: 05/19/2016 5:55:43 PM By: Alejandro Mulling Entered By: Alejandro Mulling on 05/19/2016 14:25:30 Traweek, Kelly L. ([id number]) -------------------------------------------------------------------------------- Wound Assessment Details Patient Name: Deason, Dominiq L. Date of Service: 05/19/2016 1:30  PM Medical Record Number: [id number] Patient Account Number: [id number] Date of Birth/Sex: Oct 05, 1946 (70 y.o. 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 Wounding Event: Pressure Injury Wound Status: Open Date Acquired: 12/11/2015 Comorbid Anemia, Hypertension, Type II Weeks Of Treatment: 3 History: Diabetes Clustered Wound: No Photos Photo Uploaded By: Alejandro Mulling on 05/19/2016 16:49:52 Wound Measurements Length: (cm) 1.8 Width: (cm) 2.3 Depth: (cm) 0.8 Area: (cm) 3.252 Volume: (cm) 2.601 % Reduction in Area: 45.4% % Reduction in Volume: -118.2% Epithelialization: None Tunneling: No Undermining: No Wound Description Classification: Category/Stage II Diabetic Severity [address]): Grade 1 Wound Margin: Thickened Exudate Amount: Large Exudate Type: Serosanguineous Exudate Color: red, brown Foul Odor After Cleansing: No Wound Bed Granulation Amount: Small (1-33%) Exposed Structure Granulation Quality: Red Fascia Exposed: No Necrotic Amount: Large (67-100%) Fat Layer Exposed: No Scheidt, Kaylin L. ([id number]) Necrotic Quality: Eschar, Adherent Slough Tendon Exposed: No Muscle Exposed: No Joint Exposed: No Bone Exposed: No Limited to Skin Breakdown Periwound Skin Texture Texture Color No Abnormalities Noted: No No Abnormalities Noted: No Moisture Temperature / Pain No Abnormalities Noted: No Temperature: No Abnormality Moist: Yes Tenderness on Palpation: Yes Wound Preparation Ulcer Cleansing: Rinsed/Irrigated with Saline Topical Anesthetic Applied: Other: lidocaine 4%, Treatment Notes Wound #1 (Left Calcaneus) 1. Cleansed with: Clean wound  with Normal Saline 2. Anesthetic Topical Lidocaine 4% cream to wound bed prior to debridement 3. Peri-wound Care: Barrier cream 4. Dressing Applied: Aquacel Ag 5. Secondary Dressing Applied ABD Pad Dry Gauze Foam Kerlix/Conform 7. Secured with Tape Notes netting Electronic Signature(s) Signed: 05/19/2016 5:55:43 PM By: Alejandro Mulling Entered By: Alejandro Mulling on 05/19/2016 13:49:01 Tangen, Caylin L. ([id number]) -------------------------------------------------------------------------------- Wound Assessment Details Patient Name: Smullen, Ronne L. Date of Service: 05/19/2016 1:30 PM Medical Record Number: [id number] Patient Account Number: [id number] Date of Birth/Sex: June 07, 1946 (70 y.o. Male) Treating RN: Phillis Haggis Primary Care Physician: Montez Morita, MONICA Other Clinician: Referring Physician: Montez Morita, MONICA Treating Physician/Extender: Baltazar Najjar  G Weeks in Treatment: 3 Wound Status Wound Number: 2 Primary Etiology: Pressure Ulcer Wound Location: Right Calcaneus - Lateral Secondary Diabetic Wound/Ulcer of the Lower Etiology: Extremity Wounding Event: Pressure Injury Wound Status: Open Date Acquired: 12/11/2015 Comorbid Anemia, Hypertension, Type II Weeks Of Treatment: 3 History: Diabetes Clustered Wound: No Photos Photo Uploaded By: Alejandro Mulling on 05/19/2016 16:49:52 Wound Measurements Length: (cm) 2.5 Width: (cm) 4 Depth: (cm) 0.9 Area: (cm) 7.854 Volume: (cm) 7.069 % Reduction in Area: 11.6% % Reduction in Volume: -59.2% Epithelialization: None Tunneling: No Undermining: No Wound Description Classification: Category/Stage IV Foul Odor Diabetic Severity [address]): Grade 1 Wound Margin: Thickened Exudate Amount: Large Exudate Type: Serosanguineous Exudate Color: red, brown After Cleansing: No Wound Bed Granulation Amount: Medium (34-66%) Exposed Structure Granulation Quality: Red Fascia Exposed: No Necrotic Amount: Medium  (34-66%) Fat Layer Exposed: No Andis, Primus L. ([id number]) Necrotic Quality: Adherent Slough Tendon Exposed: Yes Muscle Exposed: Yes Necrosis of Muscle: No Joint Exposed: No Bone Exposed: Yes Periwound Skin Texture Texture Color No Abnormalities Noted: No No Abnormalities Noted: No Localized Edema: Yes Erythema: Yes Erythema Location: Circumferential Moisture No Abnormalities Noted: No Temperature / Pain Moist: Yes Temperature: No Abnormality Tenderness on Palpation: Yes Wound Preparation Ulcer Cleansing: Rinsed/Irrigated with Saline Topical Anesthetic Applied: Other: lidocaine 4%, Treatment Notes Wound #2 (Right, Lateral Calcaneus) 1. Cleansed with: Clean wound with Normal Saline 2. Anesthetic Topical Lidocaine 4% cream to wound bed prior to debridement 3. Peri-wound Care: Barrier cream 4. Dressing Applied: Aquacel Ag 5. Secondary Dressing Applied ABD Pad Dry Gauze Foam Kerlix/Conform 7. Secured with Tape Notes netting Electronic Signature(s) Signed: 05/19/2016 5:55:43 PM By: Alejandro Mulling Entered By: Alejandro Mulling on 05/19/2016 13:49:39 Keepers, Chester L. ([id number]) -------------------------------------------------------------------------------- Vitals Details Patient Name: Younan, Myers L. Date of Service: 05/19/2016 1:30 PM Medical Record Number: [id number] Patient Account Number: [id number] Date of Birth/Sex: [birth year] (70 y.o. Male) Treating RN: Phillis Haggis Primary Care Physician: Montez Morita, MONICA Other Clinician: Referring Physician: Montez Morita, MONICA Treating Physician/Extender: Maxwell Caul Weeks in Treatment: 3 Vital Signs Time Taken: 13:36 Temperature (F): 97.5 Height (in): 65 Pulse (bpm): 79 Respiratory Rate (breaths/min): 18 Blood Pressure (mmHg): 112/56 Reference Range: 80 - 120 mg / dl Electronic Signature(s) Signed: 05/19/2016 5:55:43 PM By: Alejandro Mulling Entered By: Alejandro Mulling on 05/19/2016 13:36:57

## 2016-05-23 LAB — AEROBIC CULTURE  (SUPERFICIAL SPECIMEN)

## 2016-05-23 LAB — AEROBIC CULTURE W GRAM STAIN (SUPERFICIAL SPECIMEN)

## 2016-05-24 ENCOUNTER — Non-Acute Institutional Stay (SKILLED_NURSING_FACILITY): Payer: Medicare Other | Admitting: Internal Medicine

## 2016-05-24 ENCOUNTER — Encounter: Payer: Self-pay | Admitting: Internal Medicine

## 2016-05-24 DIAGNOSIS — IMO0002 Reserved for concepts with insufficient information to code with codable children: Secondary | ICD-10-CM

## 2016-05-24 DIAGNOSIS — G894 Chronic pain syndrome: Secondary | ICD-10-CM

## 2016-05-24 DIAGNOSIS — E034 Atrophy of thyroid (acquired): Secondary | ICD-10-CM | POA: Diagnosis not present

## 2016-05-24 DIAGNOSIS — K7030 Alcoholic cirrhosis of liver without ascites: Secondary | ICD-10-CM

## 2016-05-24 DIAGNOSIS — I10 Essential (primary) hypertension: Secondary | ICD-10-CM

## 2016-05-24 DIAGNOSIS — F29 Unspecified psychosis not due to a substance or known physiological condition: Secondary | ICD-10-CM

## 2016-05-24 DIAGNOSIS — E038 Other specified hypothyroidism: Secondary | ICD-10-CM | POA: Diagnosis not present

## 2016-05-24 DIAGNOSIS — E1165 Type 2 diabetes mellitus with hyperglycemia: Secondary | ICD-10-CM | POA: Diagnosis not present

## 2016-05-24 DIAGNOSIS — E1140 Type 2 diabetes mellitus with diabetic neuropathy, unspecified: Secondary | ICD-10-CM

## 2016-05-24 DIAGNOSIS — L89600 Pressure ulcer of unspecified heel, unstageable: Secondary | ICD-10-CM | POA: Diagnosis not present

## 2016-05-24 DIAGNOSIS — K7290 Hepatic failure, unspecified without coma: Secondary | ICD-10-CM | POA: Diagnosis not present

## 2016-05-24 DIAGNOSIS — Z794 Long term (current) use of insulin: Secondary | ICD-10-CM | POA: Diagnosis not present

## 2016-05-24 DIAGNOSIS — K219 Gastro-esophageal reflux disease without esophagitis: Secondary | ICD-10-CM

## 2016-05-24 DIAGNOSIS — K7682 Hepatic encephalopathy: Secondary | ICD-10-CM

## 2016-05-24 NOTE — Progress Notes (Signed)
DATE: 05/24/16  Location:  [facility]  Nursing Home Room Number: 122 B Place of Service: SNF (31)   Extended Emergency Contact Information Primary Emergency Contact: [name],[name] Address: [address]          [place], [place] Work Phone: [phone] Mobile Phone: [phone] Relation: Sister  Advanced Directive information Does patient have an advance directive?: Yes, Type of Advance Directive: Out of facility DNR (pink MOST or yellow form), Does patient want to make changes to advanced directive?: No - Patient declined  Chief Complaint  Patient presents with  . Medical Management of Chronic Issues    Routine Visit    HPI:  70 yo male long term resident seen today for f/u. He has no concerns today. He saw wound clinic last week and was started on zyvox for wound infection (cx pos for grp b strep/E coli) of right heel. He was told that if it did not improve, may need amputation. He is a poor historian due to psych d/o. Hx obtained from chart. Per chart, he will take zyvox  q12h x 10 days (stop date 6/18th)  DM - stable on levemir and  novolog SSI. No low BS reactions  Hypothyroidism - stable on synthroid 25 mcg daily   BPH - stable on flomax 0.4 mg daily   Depression with anxiety/psychosis - mood stable on seroquel 50 mg nightly; atarax prn. No recent psychotic episodes  Constipation - stable on lactulose  Hx CKD stage 3 - stable. Cr 1.3  Liver cirrhosis with metabolic encephalopathy -  Stable on spironolactone, lactulose  and rifaximin   Chronic pain - stable on dilaudid and OxyIR  Pancytopenia - Hgb 10; Plts 59K (trending down from 63K in Jun 2016); WBC 2.4K  Past Medical History  Diagnosis Date  . Hyperglycemia   . Hyponatremia   . DMII (diabetes mellitus, type 2) (HCC)   . Depression   . IBS (irritable bowel syndrome)   . Chest pain   . Unspecified gastritis and gastroduodenitis without mention of  hemorrhage   . Arthropathy, unspecified, site unspecified   . History of colon polyps   . Anemia, unspecified   . Anxiety   . GERD (gastroesophageal reflux disease)   . Hyperlipidemia   . Hypertension   . Liver problem     Past Surgical History  Procedure Laterality Date  . Hernia repair  1964  . Intramedullary (im) nail intertrochanteric Left 01/07/2015    Procedure: INTRAMEDULLARY (IM) NAIL INTERTROCHANTRIC;  Surgeon: Kathryne Hitch, MD;  Location: [facility] ORS;  Service: Orthopedics;  Laterality: Left;  . Femur im nail Right 03/24/2015    Procedure: CLOSED REDUCTION INTERNAL FIXATION;  Surgeon: Kerrin Champagne, MD;  Location: [facility] OR;  Service: Orthopedics;  Laterality: Right;  . Esophagogastroduodenoscopy N/A 04/21/2015    Procedure: ESOPHAGOGASTRODUODENOSCOPY (EGD);  Surgeon: Willis Modena, MD;  Location: [facility] ENDOSCOPY;  Service: Endoscopy;  Laterality: N/A;  . Givens capsule study N/A 04/21/2015    Procedure: GIVENS CAPSULE STUDY;  Surgeon: Willis Modena, MD;  Location: [facility] ENDOSCOPY;  Service: Endoscopy;  Laterality: N/A;  . Fracture surgery      Patient Care Team: Kirt Boys, DO as PCP - General (Internal Medicine)  Social History   Social History  . Marital Status: Divorced    Spouse Name: N/A  . Number of Children: N/A  . Years of Education: N/A   Occupational History  . Not on file.   Social  History Main Topics  . Smoking status: Former Smoker -- 1.00 packs/day    Types: Cigarettes  . Smokeless tobacco: Not on file  . Alcohol Use: No  . Drug Use: No  . Sexual Activity: Yes   Other Topics Concern  . Not on file   Social History Narrative     reports that he has quit smoking. His smoking use included Cigarettes. He smoked 1.00 pack per day. He does not have any smokeless tobacco history on file. He reports that he does not drink alcohol or use illicit drugs.  Family History  Problem Relation Age of Onset  . Stroke Mother   . Heart attack Father   . Heart  disease Father   . Cancer Other     FH of Breast Cancer-other Relative  . Heart disease Other     Parent  . Hypertension Other     parent  . Cancer Maternal Uncle     colon   Family Status  Relation Status Death Age  . Mother Deceased 55  . Father Deceased 20    MI    Immunization History  Administered Date(s) Administered  . Influenza-Unspecified 09/13/2011, 09/14/2015  . PPD Test 01/07/2014, 03/27/2015, 04/18/2016  . Pneumococcal Polysaccharide-23 04/22/2015  . Pneumococcal-Unspecified 12/13/2009  . Tdap 12/13/2009    Allergies  Allergen Reactions  . Oxycodone Other (See Comments)    Sedation;change in mental status  . Penicillins Hives    Medications: Patient's Medications  New Prescriptions   No medications on file  Previous Medications   AMBULATORY NON FORMULARY MEDICATION    Magic cup by mouth daily.   AMINO ACIDS-PROTEIN HYDROLYS (FEEDING SUPPLEMENT, PRO-STAT SUGAR FREE 64,) LIQD    Take 30 mLs by mouth daily.   FUROSEMIDE (LASIX) 20 MG TABLET    Take 20 mg by mouth.   HYDROMORPHONE (DILAUDID) 2 MG TABLET    Take 1 mg by mouth every 6 (six) hours as needed for severe pain.   HYDROXYZINE (ATARAX/VISTARIL) 25 MG TABLET    Take 25 mg by mouth every 8 (eight) hours as needed for itching.    INSULIN ASPART (NOVOLOG) 100 UNIT/ML INJECTION    Inject 5 Units into the skin 3 (three) times daily before meals. Use sliding scale based on CBG.   INSULIN DETEMIR (LEVEMIR FLEXPEN) 100 UNIT/ML PEN    Inject 14 Units into the skin at bedtime.    LACTULOSE (CHRONULAC) 10 GM/15ML SOLUTION    Take 40 g by mouth every 6 (six) hours.   LAMOTRIGINE (LAMICTAL) 100 MG TABLET    Take 100 mg by mouth daily.   LEVOTHYROXINE (SYNTHROID, LEVOTHROID) 25 MCG TABLET    Take 25 mcg by mouth daily.   PHENYLEPHRINE-SHARK LIVER OIL-MINERAL OIL-PETROLATUM (PREPARATION H) 0.25-3-14-71.9 % RECTAL OINTMENT    Place 1 application rectally 2 (two) times daily as needed for hemorrhoids.   QUETIAPINE  (SEROQUEL) 50 MG TABLET    Take 50 mg by mouth at bedtime.   RIFAXIMIN (XIFAXAN) 550 MG TABS TABLET    Take 550 mg by mouth 2 (two) times daily.   SHARK LIVER OIL-COCOA BUTTER (PREPARATION H) 0.25-3-85.5 % SUPPOSITORY    Place 1 suppository rectally every 6 (six) hours as needed for hemorrhoids.   SPIRONOLACTONE (ALDACTONE) 50 MG TABLET    Take 50 mg by mouth 2 (two) times daily.   TAMSULOSIN (FLOMAX) 0.4 MG CAPS CAPSULE    Take 0.4 mg by mouth daily. Must be given 30 minutes after same meal  daily.  Modified Medications   No medications on file  Discontinued Medications   No medications on file    Review of Systems  Unable to perform ROS: Psychiatric disorder    Filed Vitals:   05/24/16 1209  BP: 172/38  Pulse: 85  Temp: 99 F (37.2 C)  TempSrc: Oral  Resp: 22  Height: 5\' 6"  (1.676 m)  Weight: 146 lb 9.6 oz (66.497 kg)   Body mass index is 23.67 kg/(m^2).  Physical Exam  Constitutional: He appears well-developed. No distress.  Frail appearing in NAD, sitting in w/c  HENT:  Mouth/Throat: Oropharynx is clear and moist.  Eyes: Pupils are equal, round, and reactive to light. No scleral icterus.  Neck: Neck supple. Carotid bruit is present (from chest).  Cardiovascular: Normal rate, regular rhythm and intact distal pulses.  Exam reveals no gallop and no friction rub.   Murmur (1/6 SEM --> carotid b/l) heard. Trace LE edema b/l. No calf TTP. B/l unna boots intact  Pulmonary/Chest: Effort normal and breath sounds normal. He has no wheezes. He has no rales. He exhibits no tenderness.  Abdominal: Soft. Bowel sounds are normal. He exhibits no distension, no abdominal bruit, no pulsatile midline mass and no mass. There is no tenderness. There is no rebound and no guarding.  Musculoskeletal: He exhibits edema.  Lymphadenopathy:    He has no cervical adenopathy.  Neurological: He is alert.  Skin: Skin is warm and dry. No rash noted.  B/l heel wrapped with dsg intact. Followed by wound  care. Right heel has necrotic tissue; left heel with clean base.   Psychiatric: He has a normal mood and affect. His behavior is normal.     Labs reviewed: Hospital Outpatient Visit on 05/19/2016  Component Date Value Ref Range Status  . Specimen Description 05/19/2016 HEEL   Final  . Special Requests 05/19/2016 NONE   Final  . Gram Stain 05/19/2016    Final                   Value:RARE WBC PRESENT, PREDOMINANTLY PMN ABUNDANT GRAM POSITIVE COCCI IN PAIRS FEW GRAM NEGATIVE COCCOBACILLI   . Culture 05/19/2016    Final                   Value:ABUNDANT GROUP B STREP(S.AGALACTIAE)ISOLATED TESTING AGAINST S. AGALACTIAE NOT ROUTINELY PERFORMED DUE TO PREDICTABILITY OF AMP/PEN/VAN SUSCEPTIBILITY. FEW ESCHERICHIA COLI Confirmed Extended Spectrum Beta-Lactamase Producer (ESBL) Performed at [facility]   . Report Status 05/19/2016 05/23/2016 FINAL   Final  . Organism ID, Bacteria 05/19/2016 ESCHERICHIA COLI   Final  Hospital Outpatient Visit on 05/05/2016  Component Date Value Ref Range Status  . Specimen Description 05/04/2016 WOUND   Final  . Special Requests 05/04/2016 NONE   Final  . Gram Stain 05/04/2016    Final                   Value:FEW WBC SEEN MODERATE GRAM POSITIVE COCCI IN PAIRS IN CHAINS MODERATE GRAM POSITIVE COCCI IN CLUSTERS MODERATE GRAM NEGATIVE RODS   . Culture 05/04/2016    Final                   Value:HEAVY GROWTH METHICILLIN RESISTANT STAPHYLOCOCCUS AUREUS HEAVY GROWTH GROUP B STREP(S.AGALACTIAE)ISOLATED Virtually 100% of S. agalactiae (Group B) strains are susceptible to Penicillin.  For Penicillin-allergic patients, Erythromycin (85-95% sensitive) and Clindamycin (80% sensitive) are drugs of choice. Contact microbiology lab to request sensitivities if  needed within  7 days.   . Report Status 05/04/2016 05/10/2016 FINAL   Final  . Organism ID, Bacteria 05/04/2016 METHICILLIN RESISTANT STAPHYLOCOCCUS AUREUS   Final  Nursing Home on 04/19/2016  Component  Date Value Ref Range Status  . Hemoglobin 04/14/2016 10.0* 13.5 - 17.5 g/dL Final  . HCT 40/98/119105/02/2016 29* 41 - 53 % Final  . Platelets 04/14/2016 59* 150 - 399 K/L Final  . WBC 04/14/2016 2.4   Final  . Glucose 04/14/2016 240   Final  . BUN 04/14/2016 34* 4 - 21 mg/dL Final  . Creatinine 47/82/956205/02/2016 1.3  0.6 - 1.3 mg/dL Final  . Potassium 13/08/657805/02/2016 4.6  3.4 - 5.3 mmol/L Final  . Sodium 04/14/2016 138  137 - 147 mmol/L Final  . Alkaline Phosphatase 04/14/2016 103  25 - 125 U/L Final  . ALT 04/14/2016 17  10 - 40 U/L Final  . AST 04/14/2016 24  14 - 40 U/L Final  . Bilirubin, Total 04/14/2016 0.8   Final  Nursing Home on 02/27/2016  Component Date Value Ref Range Status  . Glucose 02/07/2016 304   Final  . BUN 02/07/2016 25* 4 - 21 mg/dL Final  . Creatinine 46/96/295202/25/2017 1.2  0.6 - 1.3 mg/dL Final  . Potassium 84/13/244002/25/2017 4.8  3.4 - 5.3 mmol/L Final  . Sodium 02/07/2016 131* 137 - 147 mmol/L Final    No results found.   Assessment/Plan   ICD-9-CM ICD-10-CM   1. Unstageable pressure ulcer of heel, unspecified laterality (HCC) 707.07 L89.600    707.25     infected with grp B strep/E coli  2. Chronic pain syndrome 338.4 G89.4   3. Encephalopathy, hepatic (HCC) 572.2 K72.90   4. Alcoholic cirrhosis of liver without ascites (HCC) 571.2 K70.30   5. Psychosis, unspecified psychosis type 298.9 F29   6. Essential hypertension 401.9 I10   7. Uncontrolled type 2 diabetes mellitus with diabetic neuropathy, with long-term current use of insulin (HCC) 250.62 E11.40    357.2 Z79.4    V58.67 E11.65   8. Hypothyroidism due to acquired atrophy of thyroid 244.8 E03.8    246.8 E03.4   9. Gastroesophageal reflux disease without esophagitis 530.81 K21.9    Cont zyvox tx per wound clinic  Cont other meds as ordered  Check  A1c, bmp, cbc w diff  F/u with wound clinic as scheduled  PT/OT/ST as ordered  Nutritional supplements as indicated  Will follow  Hortense Cantrall S. Ancil Linseyarter, D. O., F. A. C. O. I.   [facility] [address] [place], [place] [address] [phone] Cell (Monday-Friday 8 AM - 5 PM) [phone] After 5 PM and follow prompts

## 2016-05-26 ENCOUNTER — Encounter: Payer: PRIVATE HEALTH INSURANCE | Admitting: Internal Medicine

## 2016-05-26 DIAGNOSIS — E11621 Type 2 diabetes mellitus with foot ulcer: Secondary | ICD-10-CM | POA: Diagnosis not present

## 2016-05-26 LAB — CBC AND DIFFERENTIAL
HCT: 28 % — AB (ref 41–53)
HEMOGLOBIN: 9.3 g/dL — AB (ref 13.5–17.5)
PLATELETS: 84 10*3/uL — AB (ref 150–399)
WBC: 1.7 10*3/mL

## 2016-05-26 LAB — BASIC METABOLIC PANEL
BUN: 22 mg/dL — AB (ref 4–21)
CREATININE: 0.8 mg/dL (ref 0.6–1.3)
GLUCOSE: 65 mg/dL
POTASSIUM: 3.5 mmol/L (ref 3.4–5.3)
Sodium: 138 mmol/L (ref 137–147)

## 2016-05-26 LAB — HEMOGLOBIN A1C: Hemoglobin A1C: 8.9

## 2016-06-02 DIAGNOSIS — E11621 Type 2 diabetes mellitus with foot ulcer: Secondary | ICD-10-CM | POA: Diagnosis not present

## 2016-06-03 NOTE — Progress Notes (Signed)
[name] [name], [name] ([id number]) Visit Report for 06/02/2016 Arrival Information Details Patient Name: [name], [name] Date of Service: 06/02/2016 1:30 PM Medical Record Patient Account Number: [id number] Number: Treating RN: Phillis Haggisinkerton, [name] 05-Sep-1946 (70 y.o. Other Clinician: Date of Birth/Sex: Male) Treating [name] Primary Care Physician: [name] MoritaARTER, [name] Physician/Extender: G Referring Physician: Montez MoritaARTER, [name] Weeks in Treatment: 5 Visit Information History Since Last Visit All ordered tests and consults were completed: No Patient Arrived: Wheel Chair Added or deleted any medications: No Arrival Time: 13:50 Any new allergies or adverse reactions: No Accompanied By: friend Had a fall or experienced change in No Transfer Assistance: EasyPivot Patient activities of daily living that may affect Lift risk of falls: Patient Identification Verified: Yes Signs or symptoms of abuse/neglect since last No Secondary Verification Process Yes visito Completed: Hospitalized since last visit: No Patient Requires Transmission- No Pain Present Now: Yes Based Precautions: Patient Has Alerts: Yes Patient Alerts: DM II bil legs non- compressible Electronic Signature(s) Signed: 06/02/2016 5:56:28 PM By: [name] Entered By: [name] on 06/02/2016 13:50:41 Mcclory, [name] ([id number]) -------------------------------------------------------------------------------- Encounter Discharge Information Details Patient Name: [name], [name] Date of Service: 06/02/2016 1:30 PM Medical Record Patient Account Number: [id number] Number: Treating RN: Phillis Haggisinkerton, [name] 05-Sep-1946 (70 y.o. Other Clinician: Date of Birth/Sex: Male) Treating ROBSON, [name] Primary Care Physician: [name] MoritaARTER, [name] Physician/Extender: G Referring Physician: Kirt BoysARTER, [name] Weeks in Treatment: 5 Encounter Discharge Information Items Discharge Pain Level: 0 Discharge Condition:  Stable Ambulatory Status: Wheelchair Nursing Discharge Destination: Home Transportation: Private Auto Accompanied By: friend Schedule Follow-up Appointment: Yes Medication Reconciliation completed Yes and provided to Patient/Care [name]: Clinical Summary of Care: Electronic Signature(s) Signed: 06/02/2016 5:56:28 PM By: [name] Entered By: [name] on 06/02/2016 14:06:52 [name], [name] ([id number]) -------------------------------------------------------------------------------- Patient/Caregiver Education Details Patient Name: [name], [name] Date of Service: 06/02/2016 1:30 PM Medical Record Patient Account Number: [id number] Number: Treating RN: Phillis Haggisinkerton, [name] 05-Sep-1946 (70 y.o. Other Clinician: Date of Birth/Gender: Male) Treating ROBSON, [name] Primary Care Physician: [name] MoritaARTER, [name] Physician/Extender: G Referring Physician: Zenda AlpersARTER, [name] Weeks in Treatment: 5 Education Assessment Education Provided To: Patient Education Topics Provided Wound/Skin Impairment: Handouts: Other: do not get wrap wet Methods: Demonstration, Explain/Verbal Responses: State content correctly Electronic Signature(s) Signed: 06/02/2016 5:56:28 PM By: [name] Entered By: [name] on 06/02/2016 14:08:12 Defrank, [name] ([id number]) -------------------------------------------------------------------------------- Wound Assessment Details Patient Name: [name], [name] Date of Service: 06/02/2016 1:30 PM Medical Record Patient Account Number: [id number] Number: Treating RN: Phillis Haggisinkerton, [name] 05-Sep-1946 (70 y.o. Other Clinician: Date of Birth/Sex: Male) Treating ROBSON, Neo Primary Care Physician: [name] MoritaARTER, [name] Physician/Extender: G Referring Physician: Montez MoritaARTER, [name] Weeks in Treatment: 5 Wound Status Wound Number: 1 Primary Pressure Ulcer Etiology: Wound Location: Left Calcaneus Secondary Diabetic Wound/Ulcer of  the Wounding Event: Pressure Injury Etiology: Lower Extremity Date Acquired: 12/11/2015 Wound Status: Open Weeks Of Treatment: 5 Comorbid Anemia, Hypertension, Type II Clustered Wound: No History: Diabetes Wound Measurements Length: (cm) 1.8 Width: (cm) 2.3 Depth: (cm) 0.7 Area: (cm) 3.252 Volume: (cm) 2.276 % Reduction in Area: 45.4% % Reduction in Volume: -90.9% Epithelialization: None Tunneling: No Undermining: No Wound Description Classification: Category/Stage II Diabetic Severity [address](Wagner): Grade 1 Wound Margin: Thickened Exudate Amount: Large Exudate Type: Serous Exudate Color: amber Foul Odor After Cleansing: No Wound Bed Granulation Amount: Large (67-100%) Exposed Structure Granulation Quality: Red Fascia Exposed: No Necrotic Amount: Small (1-33%) Fat Layer Exposed: No Necrotic Quality: Eschar, Adherent Slough Tendon Exposed: No Muscle Exposed: No Joint Exposed: No Bone Exposed:  No Limited to Skin Breakdown Periwound Skin Texture Texture Color No Abnormalities Noted: No No Abnormalities Noted: No [name], [name] ([id number]) Moisture Temperature / Pain No Abnormalities Noted: No Temperature: No Abnormality Moist: Yes Tenderness on Palpation: Yes Wound Preparation Ulcer Cleansing: Rinsed/Irrigated with Saline Topical Anesthetic Applied: Other: lidocaine 4%, Assessment Notes did not change the dressing over the wound d/t skin graft Treatment Notes Wound #1 (Left Calcaneus) 1. Cleansed with: Cleanse wound with antibacterial soap and water 5. Secondary Dressing Applied ABD Pad Dry Gauze 7. Secured with Tape 3 Layer Compression System - Bilateral Notes xtrasorb, charcoal Electronic Signature(s) Signed: 06/02/2016 5:56:28 PM By: [name] Entered By: [name] on 06/02/2016 14:05:03 [name], [name] ([id number]) -------------------------------------------------------------------------------- Wound Assessment Details Patient  Name: Brocksmith, [name] Date of Service: 06/02/2016 1:30 PM Medical Record Patient Account Number: [id number] Number: Treating RN: Phillis Haggisinkerton, [name] 05-27-1946 (70 y.o. Other Clinician: Date of Birth/Sex: Male) Treating ROBSON, [name] Primary Care Physician: [name] MoritaARTER, [name] Physician/Extender: G Referring Physician: Montez MoritaARTER, [name] Weeks in Treatment: 5 Wound Status Wound Number: 2 Primary Pressure Ulcer Etiology: Wound Location: Right Calcaneus - Lateral Secondary Diabetic Wound/Ulcer of the Wounding Event: Pressure Injury Etiology: Lower Extremity Date Acquired: 12/11/2015 Wound Status: Open Weeks Of Treatment: 5 Comorbid Anemia, Hypertension, Type II Clustered Wound: No History: Diabetes Wound Measurements Length: (cm) 2.1 Width: (cm) 3 Depth: (cm) 0.9 Area: (cm) 4.948 Volume: (cm) 4.453 % Reduction in Area: 44.3% % Reduction in Volume: -0.3% Epithelialization: None Tunneling: No Undermining: No Wound Description Classification: Category/Stage IV Foul Odor Diabetic Severity (Wagner): Grade 1 Wound Margin: Thickened Exudate Amount: Large Exudate Type: Serosanguineous Exudate Color: red, brown After Cleansing: No Wound Bed Granulation Amount: Medium (34-66%) Exposed Structure Granulation Quality: Red Fascia Exposed: No Necrotic Amount: Medium (34-66%) Fat Layer Exposed: No Necrotic Quality: Adherent Slough Tendon Exposed: Yes Muscle Exposed: Yes Necrosis of Muscle: No Joint Exposed: No Bone Exposed: Yes Periwound Skin Texture Texture Color No Abnormalities Noted: No No Abnormalities Noted: No Knierim, [name] ([id number]) Localized Edema: Yes Erythema: Yes Erythema Location: Circumferential Moisture No Abnormalities Noted: No Temperature / Pain Moist: Yes Temperature: No Abnormality Tenderness on Palpation: Yes Wound Preparation Ulcer Cleansing: Rinsed/Irrigated with Saline Topical Anesthetic Applied: Other: lidocaine 4%, Assessment  Notes did not change the dressing over the wound d/t skin graft Treatment Notes Wound #2 (Right, Lateral Calcaneus) 1. Cleansed with: Cleanse wound with antibacterial soap and water 5. Secondary Dressing Applied ABD Pad Dry Gauze 7. Secured with Tape 3 Layer Compression System - Bilateral Notes xtrasorb, charcoal Electronic Signature(s) Signed: 06/02/2016 5:56:28 PM By: [name] Entered By: [name] on 06/02/2016 14:05:25

## 2016-06-09 ENCOUNTER — Encounter: Payer: PRIVATE HEALTH INSURANCE | Admitting: Internal Medicine

## 2016-06-09 DIAGNOSIS — E11621 Type 2 diabetes mellitus with foot ulcer: Secondary | ICD-10-CM | POA: Diagnosis not present

## 2016-06-10 NOTE — Progress Notes (Signed)
[name] ([id number]) Visit Report for 06/09/2016 Chief Complaint Document Details Patient Name: [name], [name] Date of Service: 06/09/2016 2:30 PM Medical Record Patient Account Number: [id number] Number: Treating RN: Phillis Haggis 1946/08/06 (70 y.o. Other Clinician: Date of Birth/Sex: Male) Treating ROBSON, [name] Primary Care Physician: [name] Physician/Extender: G Referring Physician: Kirt [name] Weeks in Treatment: 6 Information Obtained from: Patient Chief Complaint Patient is here for our review of bilateral heel ulcers for the last 5-6 months Electronic Signature(s) Signed: 06/09/2016 4:24:59 PM By: [name] Najjar MD Entered By: [name] on 06/09/2016 15:45:00 [name] ([id number]) -------------------------------------------------------------------------------- Cellular or Tissue Based Product Details Patient Name: [name], [name] Date of Service: 06/09/2016 2:30 PM Medical Record Patient Account Number: [id number] Number: Treating RN: Phillis Haggis Apr 19, 1946 (70 y.o. Other Clinician: Date of Birth/Sex: Male) Treating ROBSON, [name] Primary Care Physician: [name] Physician/Extender: G Referring Physician: Kirt [name] Weeks in Treatment: 6 Cellular or Tissue Based Wound #1 Left Calcaneus Product Type Applied to: Performed By: Physician Maxwell Caul, MD Cellular or Tissue Based Apligraf Product Type: Time-Out Taken: Yes Location: genitalia / hands / feet / multiple digits Wound Size (sq cm): 3.96 Product Size (sq cm): 44 Waste Size (sq cm): 40.04 Waste Reason: WOUND SIZE Amount of Product Applied (sq cm): 3.96 Lot #: [id number] Expiration Date: 06/17/2016 Fenestrated: Yes Instrument: Blade Reconstituted: Yes Solution Type: NORMAL SALINE Solution Amount: Lot #: [id number] Solution Expiration 02/10/2018 Date: Secured: Yes Secured With: Steri-Strips Dressing Applied:  Yes Primary Dressing: MEPITEL Procedural Pain: 0 Post Procedural Pain: 0 Response to Treatment: Procedure was tolerated well Post Procedure Diagnosis Same as Pre-procedure Electronic Signature(s) Signed: 06/09/2016 4:24:59 PM By: [name] Najjar MD [name] ([id number]) Entered By: [name] on 06/09/2016 15:44:36 [name], [name] ([id number]) -------------------------------------------------------------------------------- HPI Details Patient Name: [name], [name] Date of Service: 06/09/2016 2:30 PM Medical Record Patient Account Number: [id number] Number: Treating RN: Phillis Haggis [birth year] (70 y.o. Other Clinician: Date of Birth/Sex: Male) Treating [name] Primary Care Physician: [name] Physician/Extender: G Referring Physician: Kirt [name] Weeks in Treatment: 6 History of Present Illness HPI Description: 04/27/16; this is a 70 year old man who is a resident of [facility] in [place]. He is accompanied by a family friend today. She tells me that he has had problems with pressure ulcers since December 2016 largely at a time where he apparently had diabetic hypoglycemic episodes. Apparently these became so severe that he had to be transferred to [facility] for operative debridement apparently bilaterally. I don't have any records from [facility] but per his friend they did not do imaging studies bone cultures arterial studies etc. Apparently the area just applying wet to dry dressings to these. The patient is a diabetic on insulin. His arterial studies here were noncompressible although his pulses could be dopplered. 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 At one point I note in his notes he was on hospice I'm not really  sure whether that is true currently or not. 05/04/16; x-ray of the right heel done in his skilled facility was negative for significant bone pathology. Arterial studies also done in the facility revealed noncompressible vessels therefore no ABI could be calculated. His arterial ultrasound showed low velocities scattered atherosclerotic disease negative for focal stenosis but monophasic waveforms bilaterally. The overall impression was "mild peripheral  arterial disease" 05/11/16; culture last week of the right heel grew MRSA and group B strep. We started doxycycline today. 05/19/16; I started the patient on doxycycline 8 days ago. He has not tolerated this well secondary to gastric irritation. He is also complaining of increasing pain in the right heel. 05/26/16; the patient is completing his Zyvox that I gave him last week. Culture grew heavy group B strep and a few Escherichia coli. Surprisingly did not culture any further MRSA. Apligraf #1 06/09/16 both heal wounds are here for review today. Apligraf #2 Electronic Signature(s) Signed: 06/09/2016 4:24:59 PM By: [name] Najjarobson, Lief MD Entered By: [name] Najjarobson, Le[place] on 06/09/2016 15:45:41 [name], [name] ([id number]) -------------------------------------------------------------------------------- Physical Exam Details Patient Name: [name], [name] Date of Service: 06/09/2016 2:30 PM Medical Record Patient Account Number: [id number] Number: Treating RN: Phillis Haggisinkerton, [name] June 12, 1946 (70 y.o. Other Clinician: Date of Birth/Sex: Male) Treating ROBSON, Myron Primary Care Physician: [name] MoritaARTER, [name] Physician/Extender: G Referring Physician: Kirt BoysARTER, [name] Weeks in Treatment: 6 Notes Wound exam; wounds on his bilateral Achilles areas right greater than left. Base of these appears healthy there is no evidence of surrounding infection or drainage. Electronic Signature(s) Signed: 06/09/2016 4:24:59 PM By: [name] Najjarobson, Wally MD Entered By: [name] Najjarobson,  Rande on 06/09/2016 15:46:36 [name], [name] ([id number]) -------------------------------------------------------------------------------- Physician Orders Details Patient Name: [name], [name] Date of Service: 06/09/2016 2:30 PM Medical Record Patient Account Number: [id number] Number: Treating RN: Phillis Haggisinkerton, [name] June 12, 1946 (70 y.o. Other Clinician: Date of Birth/Sex: Male) Treating ROBSON, Jordynn Primary Care Physician: [name] MoritaARTER, [name] Physician/Extender: G Referring Physician: Kirt BoysARTER, [name] Weeks in Treatment: 6 Verbal / Phone Orders: Yes Clinician: Pinkerton, [name] Read Back and Verified: Yes Diagnosis Coding Wound Cleansing Wound #1 Left Calcaneus o Clean wound with Normal Saline. - FOR CLINIC USE Wound #2 Right,Lateral Calcaneus o Clean wound with Normal Saline. - FOR CLINIC USE Anesthetic Wound #1 Left Calcaneus o Topical Lidocaine 4% cream applied to wound bed prior to debridement - for clinic use only Wound #2 Right,Lateral Calcaneus o Topical Lidocaine 4% cream applied to wound bed prior to debridement - for clinic use only Skin Barriers/Peri-Wound Care Wound #1 Left Calcaneus o Barrier cream - FOR CLINIC USE Wound #2 Right,Lateral Calcaneus o Barrier cream - FOR CLINIC USE Primary Wound Dressing Wound #1 Left Calcaneus o Other: - COLLAGEN DRESSING Wound #2 Right,Lateral Calcaneus o Other: - APLIGRAPG Secondary Dressing Wound #1 Left Calcaneus o Dry Gauze o XtraSorb - CHARCOAL, HEEL CUP, Mepitel, steri-strips Wound #2 Right,Lateral Calcaneus Swiney, Stephenson L. ([id number]) o Dry Gauze o XtraSorb - CHARCOAL, HEEL CUP, Mepitel, steri-strips Dressing Change Frequency Wound #1 Left Calcaneus o Change dressing every week - AT CLINIC Wound #2 Right,Lateral Calcaneus o Change dressing every week - AT CLINIC Follow-up Appointments Wound #1 Left Calcaneus o Return Appointment in 1 week. Wound #2 Right,Lateral  Calcaneus o Return Appointment in 1 week. Edema Control Wound #1 Left Calcaneus o 2 Layer Lite Compression System - Bilateral - kerlix and coban Wound #2 Right,Lateral Calcaneus o 2 Layer Lite Compression System - Bilateral - kerlix and coban Off-Loading Wound #1 Left Calcaneus o Heel suspension boot to: - please float the heels when pt is sitting with feet up or when pt is lying in bed. o Turn and reposition every 2 hours Wound #2 Right,Lateral Calcaneus o Heel suspension boot to: - please float the heels when pt is sitting with feet up or when pt is lying in bed. o Turn and reposition every 2 hours Additional Orders /  Instructions Wound #1 Left Calcaneus o Increase protein intake. o Other: - Physical Therapy pt may walk with wheel chair behind patient during therapy daily. Walk with non-slip foot wear. Wound #2 Right,Lateral Calcaneus o Increase protein intake. o Activity as tolerated - Physical Therapy pt may walk with wheel chair behind patient during therapy daily. Walk with non-slip foot wear. o Other: - Continue with bedbath until further notice. Rathod, Darryel L. ([id number]) Medications-please add to medication list. Wound #1 Left Calcaneus o Other: - Vitamin C, Zinc, Multivitamin Wound #2 Right,Lateral Calcaneus o Other: - Vitamin C, Zinc, Multivitamin Electronic Signature(s) Signed: 06/09/2016 4:24:59 PM By: [name] Najjar MD Signed: 06/09/2016 5:39:03 PM By: Alejandro Mulling Entered By: Alejandro Mulling on 06/09/2016 16:11:26 Deeney, Alper L. ([id number]) -------------------------------------------------------------------------------- Problem List Details Patient Name: [name], [name] Date of Service: 06/09/2016 2:30 PM Medical Record Patient Account Number: [id number] Number: Treating RN: Phillis Haggis 01-19-1946 (70 y.o. 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 ([id number]) -------------------------------------------------------------------------------- Progress Note Details Patient Name: [name], [name] Date of Service: 06/09/2016 2:30 PM Medical Record Patient Account Number: [id number] Number: Treating RN: Phillis Haggis [birth year] (70 y.o. Other Clinician: Date of Birth/Sex: Male) Treating ROBSON, Valdemar Primary Care Physician: [name] Physician/Extender: G Referring Physician: Kirt [name] Weeks in Treatment: 6 Subjective Chief Complaint Information obtained from Patient Patient is here for our review of bilateral heel ulcers for the last 5-6 months History of Present Illness (HPI) 04/27/16; this is a 70 year old man who is a resident of [facility] in [place]. He is accompanied by a family friend today. She tells me that he has had problems with pressure ulcers since December 2016 largely at a time where he apparently had diabetic hypoglycemic episodes. Apparently these became so severe that he had to be transferred to [facility] for operative debridement apparently bilaterally. I don't have any records from [facility] but per his friend they did not do imaging studies bone cultures arterial studies etc. Apparently the area just applying wet  to dry dressings to these. The patient is a diabetic on insulin. His arterial studies here were noncompressible although his pulses could be dopplered. 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 At one point I note in his notes he was on hospice I'm not really sure whether that is true currently or not. 05/04/16; x-ray of the right heel done in his skilled facility was negative for significant bone pathology. Arterial studies also done in the facility revealed noncompressible vessels therefore no ABI could be calculated. His arterial ultrasound showed low velocities scattered atherosclerotic disease negative for focal stenosis but monophasic waveforms bilaterally. The overall impression was "mild peripheral arterial disease" 05/11/16; culture last week of the right heel grew MRSA and group B strep. We started doxycycline today. 05/19/16;  I started the patient on doxycycline 8 days ago. He has not tolerated this well secondary to gastric irritation. He is also complaining of increasing pain in the right heel. 05/26/16; the patient is completing his Zyvox that I gave him last week. Culture grew heavy group B strep and a few Escherichia coli. Surprisingly did not culture any further MRSA. Apligraf #1 06/09/16 both heal wounds are here for review today. Apligraf #2 Objective Constitutional Vitals Time Taken: 2:21 PM, Height: 65 in, Temperature: 97.7 F, Pulse: 87 bpm, Respiratory Rate: 18 [name], [name] ([id number]) breaths/min, Blood Pressure: 110/47 mmHg. Integumentary (Hair, Skin) Wound #1 status is Open. Original cause of wound was Pressure Injury. The wound is located on the Left Calcaneus. The wound measures 2.2cm length x 1.8cm width x 0.5cm depth; 3.11cm^2 area and 1.555cm^3 volume. The wound is  limited to skin breakdown. There is no tunneling or undermining noted. There is a large amount of serous drainage noted. The wound margin is thickened. There is large (67- 100%) red granulation within the wound bed. There is a small (1-33%) amount of necrotic tissue within the wound bed including Eschar and Adherent Slough. The periwound skin appearance exhibited: Maceration, Moist. Periwound temperature was noted as No Abnormality. The periwound has tenderness on palpation. Wound #2 status is Open. Original cause of wound was Pressure Injury. The wound is located on the Right,Lateral Calcaneus. The wound measures 1.8cm length x 3cm width x 0.9cm depth; 4.241cm^2 area and 3.817cm^3 volume. There is bone, muscle, and tendon exposed. There is no tunneling or undermining noted. There is a large amount of serosanguineous drainage noted. The wound margin is thickened. There is large (67-100%) red granulation within the wound bed. There is a small (1-33%) amount of necrotic tissue within the wound bed including Adherent Slough. The periwound skin appearance exhibited: Localized Edema, Maceration, Moist, Erythema. The surrounding wound skin color is noted with erythema which is circumferential. Periwound temperature was noted as No Abnormality. The periwound has tenderness on palpation. Assessment Active Problems ICD-10 E11.621 - Type 2 diabetes mellitus with foot ulcer E11.42 - Type 2 diabetes mellitus with diabetic polyneuropathy L89.614 - Pressure ulcer of right heel, stage 4 L89.623 - Pressure ulcer of left heel, stage 3 I35.0 - Nonrheumatic aortic (valve) stenosis Procedures Wound #1 Wound #1 is a Pressure Ulcer located on the Left Calcaneus. A skin graft procedure using a bioengineered skin substitute/cellular or tissue based product was performed by Maxwell Caul, MD. Apligraf was applied and secured with Steri-Strips. 3.96 sq cm of product was utilized and 40.04 sq cm was wasted  due to WOUND SIZE. Post Application, MEPITEL was applied. A Time Out was conducted prior to the start of the procedure. The procedure was tolerated well with a pain level of 0 throughout and a pain level of 0 following the procedure. [name], [name] ([id number]) Post procedure Diagnosis Wound #1: Same as Pre-Procedure . Plan Wound Cleansing: Wound #1 Left Calcaneus: Clean wound with Normal Saline. - FOR CLINIC USE Wound #2 Right,Lateral Calcaneus: Clean wound with Normal Saline. - FOR CLINIC USE Anesthetic: Wound #1 Left Calcaneus: Topical Lidocaine 4% cream applied to wound bed prior to debridement - for clinic use only Wound #2 Right,Lateral Calcaneus: Topical Lidocaine 4% cream applied to wound bed prior to debridement - for clinic use only Skin Barriers/Peri-Wound Care: Wound #1 Left Calcaneus: Barrier cream - FOR CLINIC USE Wound #2 Right,Lateral Calcaneus: Barrier cream - FOR CLINIC USE Primary Wound Dressing: Wound #1 Left Calcaneus:  Other: - COLLAGEN DRESSING Wound #2 Right,Lateral Calcaneus: Other: - APLIGRAPG Secondary Dressing: Wound #1 Left Calcaneus: Dry Gauze XtraSorb - CHARCOAL, HEEL CUP Wound #2 Right,Lateral Calcaneus: Dry Gauze XtraSorb - CHARCOAL, HEEL CUP Dressing Change Frequency: Wound #1 Left Calcaneus: Change dressing every week - AT CLINIC Wound #2 Right,Lateral Calcaneus: Change dressing every week - AT CLINIC Follow-up Appointments: Wound #1 Left Calcaneus: Return Appointment in 1 week. Wound #2 Right,Lateral Calcaneus: Return Appointment in 1 week. Edema Control: Wound #1 Left Calcaneus: 2 Layer Lite Compression System - Bilateral - kerlix and coban Wound #2 Right,Lateral Calcaneus: [name], [name] ([id number]) 2 Layer Lite Compression System - Bilateral - kerlix and coban Off-Loading: Wound #1 Left Calcaneus: Heel suspension boot to: - please float the heels when pt is sitting with feet up or when pt is lying in bed. Turn and  reposition every 2 hours Wound #2 Right,Lateral Calcaneus: Heel suspension boot to: - please float the heels when pt is sitting with feet up or when pt is lying in bed. Turn and reposition every 2 hours Additional Orders / Instructions: Wound #1 Left Calcaneus: Increase protein intake. Other: - Physical Therapy pt may walk with wheel chair behind patient during therapy daily. Walk with non- slip foot wear. Wound #2 Right,Lateral Calcaneus: Increase protein intake. Activity as tolerated - Physical Therapy pt may walk with wheel chair behind patient during therapy daily. Walk with non-slip foot wear. Other: - Continue with bedbath until further notice. Medications-please add to medication list.: Wound #1 Left Calcaneus: Other: - Vitamin C, Zinc, Multivitamin Wound #2 Right,Lateral Calcaneus: Other: - Vitamin C, Zinc, Multivitamin Apligraf #2 Question was raised about ambulation I really don't have a problem with him walking as long as there is not pressure on his heels from his foot where Electronic Signature(s) Signed: 06/09/2016 4:24:59 PM By: [name] Najjarobson, Robt MD Entered By: [name] Najjarobson, Yasseen on 06/09/2016 15:47:23 [name], [name] ([id number]) -------------------------------------------------------------------------------- SuperBill Details Patient Name: Ange, [name] L. Date of Service: 06/09/2016 Medical Record Patient Account Number: [id number] Number: Treating RN: Phillis Haggisinkerton, [name] 11-07-1946 (70 y.o. 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, stage 4 L89.623 Pressure ulcer of left heel, stage 3 I35.0 Nonrheumatic aortic (valve)  stenosis Facility Procedures CPT4 Code: [id number] Description: (Facility Use Only) Apligraf 1 SQ CM Modifier: Quantity: 44 CPT4 Code: 2440102736100150 Description: 15275 - SKIN SUB GRAFT FACE/NK/HF/G ICD-10 Description Diagnosis E11.621 Type 2 diabetes mellitus with foot ulcer Modifier: Quantity: 1 Physician Procedures CPT4 Code: [id number] Description: 15275 - WC PHYS SKIN SUB GRAFT FACE/NK/HF/G ICD-10 Description Diagnosis E11.621 Type 2 diabetes mellitus with foot ulcer Modifier: Quantity: 1 Electronic Signature(s) Signed: 06/09/2016 4:24:59 PM By: [name] Najjarobson, Andrell MD Entered By: [name] Najjarobson, Anothy on 06/09/2016 15:52:09

## 2016-06-10 NOTE — Progress Notes (Signed)
[name], [name] ([id number]) Visit Report for 06/09/2016 Arrival Information Details Patient Name: [name], [name] Date of Service: 06/09/2016 2:30 PM Medical Record Number: [id number] Patient Account Number: [id number] Date of Birth/Sex: 1946-01-10 29(70 y.o. Male) Treating RN: Phillis HaggisPinkerton, Debi Primary Care Physician: Montez MoritaARTER, MONICA Other Clinician: Referring Physician: Montez MoritaARTER, MONICA Treating Physician/Extender: Maxwell CaulOBSON, Maycen G Weeks in Treatment: 6 Visit Information History Since Last Visit All ordered tests and consults were completed: No Patient Arrived: Wheel Chair Added or deleted any medications: No Arrival Time: 14:21 Any new allergies or adverse reactions: No Accompanied By: friend Had a fall or experienced change in No Transfer Assistance: EasyPivot Patient activities of daily living that may affect Lift risk of falls: Patient Identification Verified: Yes Signs or symptoms of abuse/neglect since last No Secondary Verification Process Yes visito Completed: Hospitalized since last visit: No Patient Requires Transmission- No Pain Present Now: No Based Precautions: Patient Has Alerts: Yes Patient Alerts: DM II bil legs non- compressible Electronic Signature(s) Signed: 06/09/2016 5:39:03 PM By: Alejandro MullingPinkerton, Debra Entered By: Alejandro MullingPinkerton, Debra on 06/09/2016 14:21:37 [name], [name] ([id number]) -------------------------------------------------------------------------------- Encounter Discharge Information Details Patient Name: [name], [name] Date of Service: 06/09/2016 2:30 PM Medical Record Number: [id number] Patient Account Number: [id number] Date of Birth/Sex: 1946-01-10 [age] y.o. Male) Treating RN: Phillis HaggisPinkerton, Debi Primary Care Physician: Montez MoritaARTER, MONICA Other Clinician: Referring Physician: Montez MoritaARTER, MONICA Treating Physician/Extender: Altamese CarolinaOBSON, Algenis G Weeks in Treatment: 6 Encounter Discharge Information Items Discharge Pain Level: 0 Discharge Condition:  Stable Ambulatory Status: Wheelchair Discharge Destination: Nursing Home Transportation: Other Accompanied By: friend Schedule Follow-up Appointment: Yes Medication Reconciliation completed and provided to Patient/Care Yes Zannah Melucci: Provided on Clinical Summary of Care: 06/09/2016 Form Type Recipient Paper Patient MS Electronic Signature(s) Signed: 06/09/2016 5:39:03 PM By: Alejandro MullingPinkerton, Debra Previous Signature: 06/09/2016 3:39:48 PM Version By: Gwenlyn PerkingMoore, Shelia Entered By: Alejandro MullingPinkerton, Debra on 06/09/2016 15:41:09 [name], [name] ([id number]) -------------------------------------------------------------------------------- Lower Extremity Assessment Details Patient Name: Metallo, Meng L. Date of Service: 06/09/2016 2:30 PM Medical Record Number: [id number] Patient Account Number: [id number] Date of Birth/Sex: 1946-01-10 [age] y.o. Male) Treating RN: Phillis HaggisPinkerton, Debi Primary Care Physician: Montez MoritaARTER, MONICA Other Clinician: Referring Physician: Montez MoritaARTER, MONICA Treating Physician/Extender: Maxwell CaulOBSON, Tharun G Weeks in Treatment: 6 Vascular Assessment Pulses: Posterior Tibial Dorsalis Pedis Palpable: [Left:No] [Right:No] Doppler: [Left:Monophasic] [Right:Monophasic] Extremity colors, hair growth, and conditions: Temperature of Extremity: [Left:Warm] [Right:Warm] Capillary Refill: [Left:< 3 seconds] [Right:< 3 seconds] Toe Nail Assessment Left: Right: Thick: No No Discolored: No No Deformed: No No Improper Length and Hygiene: No No Electronic Signature(s) Signed: 06/09/2016 5:39:03 PM By: Alejandro MullingPinkerton, Debra Entered By: Alejandro MullingPinkerton, Debra on 06/09/2016 14:40:27 [name], [name] ([id number]) -------------------------------------------------------------------------------- Multi Wound Chart Details Patient Name: [name], [name] Date of Service: 06/09/2016 2:30 PM Medical Record Number: [id number] Patient Account Number: [id number] Date of Birth/Sex: 1946-01-10 [age] y.o. Male) Treating RN:  Phillis HaggisPinkerton, Debi Primary Care Physician: Montez MoritaARTER, MONICA Other Clinician: Referring Physician: Montez MoritaARTER, MONICA Treating Physician/Extender: Maxwell CaulOBSON, Dashiel G Weeks in Treatment: 6 Vital Signs Height(in): 65 Pulse(bpm): 87 Weight(lbs): Blood Pressure 110/47 (mmHg): Body Mass Index(BMI): Temperature(F): 97.7 Respiratory Rate 18 (breaths/min): Photos: [1:No Photos] [2:No Photos] [N/A:N/A] Wound Location: [1:Left Calcaneus] [2:Right Calcaneus - Lateral] [N/A:N/A] Wounding Event: [1:Pressure Injury] [2:Pressure Injury] [N/A:N/A] Primary Etiology: [1:Pressure Ulcer] [2:Pressure Ulcer] [N/A:N/A] Secondary Etiology: [1:Diabetic Wound/Ulcer of the Lower Extremity] [2:Diabetic Wound/Ulcer of the Lower Extremity] [N/A:N/A] Comorbid History: [1:Anemia, Hypertension, Type II Diabetes] [2:Anemia, Hypertension, Type II Diabetes] [N/A:N/A] Date Acquired: [1:12/11/2015] [2:12/11/2015] [N/A:N/A] Weeks of Treatment: [1:6] [2:6] [N/A:N/A] Wound Status: [1:Open] [2:Open] [N/A:N/A] Measurements L x W  x D 2.2x1.8x0.5 [2:1.8x3x0.9] [N/A:N/A] (cm) Area (cm) : [1:3.11] [2:4.241] [N/A:N/A] Volume (cm) : [1:1.555] [2:3.817] [N/A:N/A] % Reduction in Area: [1:47.80%] [2:52.30%] [N/A:N/A] % Reduction in Volume: -30.50% [2:14.10%] [N/A:N/A] Classification: [1:Category/Stage II] [2:Category/Stage IV] [N/A:N/A] HBO Classification: [1:Grade 1] [2:Grade 1] [N/A:N/A] Exudate Amount: [1:Large] [2:Large] [N/A:N/A] Exudate Type: [1:Serous] [2:Serosanguineous] [N/A:N/A] Exudate Color: [1:amber] [2:red, brown] [N/A:N/A] Wound Margin: [1:Thickened] [2:Thickened] [N/A:N/A] Granulation Amount: [1:Large (67-100%)] [2:Large (67-100%)] [N/A:N/A] Granulation Quality: [1:Red] [2:Red] [N/A:N/A] Necrotic Amount: [1:Small (1-33%)] [2:Small (1-33%)] [N/A:N/A] Necrotic Tissue: [1:Eschar, Adherent Slough] [2:Adherent Slough] [N/A:N/A] Exposed Structures: [1:Fascia: No Fat: No] [2:Tendon: Yes Muscle: Yes] [N/A:N/A] Tendon:  No Bone: Yes Muscle: No Fascia: No Joint: No Fat: No Bone: No Joint: No Limited to Skin Breakdown Epithelialization: None None N/A Periwound Skin Texture: No Abnormalities Noted Edema: Yes N/A Periwound Skin Maceration: Yes Maceration: Yes N/A Moisture: Moist: Yes Moist: Yes Periwound Skin Color: No Abnormalities Noted Erythema: Yes N/A Erythema Location: N/A Circumferential N/A Temperature: No Abnormality No Abnormality N/A Tenderness on Yes Yes N/A Palpation: Wound Preparation: Ulcer Cleansing: Ulcer Cleansing: N/A Rinsed/Irrigated with Rinsed/Irrigated with Saline Saline Topical Anesthetic Topical Anesthetic Applied: Other: lidocaine Applied: Other: lidocaine 4% 4% Treatment Notes Electronic Signature(s) Signed: 06/09/2016 5:39:03 PM By: Alejandro MullingPinkerton, Debra Entered By: Alejandro MullingPinkerton, Debra on 06/09/2016 14:54:03 Train, [name] ([id number]) -------------------------------------------------------------------------------- Multi-Disciplinary Care Plan Details Patient Name: Weant, [name] Date of Service: 06/09/2016 2:30 PM Medical Record Number: [id number] Patient Account Number: [id number] Date of Birth/Sex: [birth year] [age] y.o. Male) Treating RN: Phillis HaggisPinkerton, Debi Primary Care Physician: Montez MoritaARTER, MONICA Other Clinician: Referring Physician: Montez MoritaARTER, MONICA Treating Physician/Extender: Altamese CarolinaOBSON, Kelson G Weeks in Treatment: 6 Active Inactive Abuse / Safety / Falls / Self Care Management Nursing Diagnoses: Potential for falls Goals: Patient will remain injury free Date Initiated: 04/27/2016 Goal Status: Active Interventions: Assess fall risk on admission and as needed Assess self care needs on admission and as needed Notes: Nutrition Nursing Diagnoses: Imbalanced nutrition Goals: Patient/caregiver agrees to and verbalizes understanding of need to use nutritional supplements and/or vitamins as prescribed Date Initiated: 04/27/2016 Goal Status:  Active Interventions: Assess patient nutrition upon admission and as needed per policy Notes: Orientation to the Wound Care Program Nursing Diagnoses: Knowledge deficit related to the wound healing center program Goals: Steinke, [name] [name] EwingsL. ([id number]) Patient/caregiver will verbalize understanding of the Wound Healing Center Program Date Initiated: 04/27/2016 Goal Status: Active Interventions: Provide education on orientation to the wound center Notes: Pain, Acute or Chronic Nursing Diagnoses: Pain, acute or chronic: actual or potential Potential alteration in comfort, pain Goals: Patient will verbalize adequate pain control and receive pain control interventions during procedures as needed Date Initiated: 04/27/2016 Goal Status: Active Patient/caregiver will verbalize adequate pain control between visits Date Initiated: 04/27/2016 Goal Status: Active Interventions: Assess comfort goal upon admission Complete pain assessment as per visit requirements Notes: Pressure Nursing Diagnoses: Knowledge deficit related to causes and risk factors for pressure ulcer development Knowledge deficit related to management of pressures ulcers Goals: Patient will remain free from development of additional pressure ulcers Date Initiated: 04/27/2016 Goal Status: Active Interventions: Assess: immobility, friction, shearing, incontinence upon admission and as needed Assess offloading mechanisms upon admission and as needed Assess potential for pressure ulcer upon admission and as needed Notes: Wigfall, [name] ([id number]) Wound/Skin Impairment Nursing Diagnoses: Impaired tissue integrity Goals: Ulcer/skin breakdown will have a volume reduction of 30% by week 4 Date Initiated: 04/27/2016 Goal Status: Active Ulcer/skin breakdown will have a volume reduction of 50% by week 8 Date Initiated: 04/27/2016 Goal Status: Active  Ulcer/skin breakdown will have a volume reduction of 80% by week 12 Date  Initiated: 04/27/2016 Goal Status: Active Interventions: Assess patient/caregiver ability to perform ulcer/skin care regimen upon admission and as needed Assess ulceration(s) every visit Notes: Electronic Signature(s) Signed: 06/09/2016 5:39:03 PM By: Alejandro Mulling Entered By: Alejandro Mulling on 06/09/2016 14:53:55 Grossi, [name] ([id number]) -------------------------------------------------------------------------------- Pain Assessment Details Patient Name: Melick, [name] Date of Service: 06/09/2016 2:30 PM Medical Record Number: [id number] Patient Account Number: [id number] Date of Birth/Sex: 07/04/1946 (70 y.o. Male) Treating RN: Phillis Haggis Primary Care Physician: Montez Morita, MONICA Other Clinician: Referring Physician: Montez Morita, MONICA Treating Physician/Extender: Maxwell Caul Weeks in Treatment: 6 Active Problems Location of Pain Severity and Description of Pain Patient Has Paino No Site Locations With Dressing Change: No Pain Management and Medication Current Pain Management: Electronic Signature(s) Signed: 06/09/2016 5:39:03 PM By: Alejandro Mulling Entered By: Alejandro Mulling on 06/09/2016 14:21:48 Buenger, Khizar [name] ([id number]) -------------------------------------------------------------------------------- Patient/Caregiver Education Details Patient Name: Hoffer, [name] Date of Service: 06/09/2016 2:30 PM Medical Record Number: [id number] Patient Account Number: [id number] Date of Birth/Gender: 02-09-1946 (70 y.o. Male) Treating RN: Phillis Haggis Primary Care Physician: Montez Morita, MONICA Other Clinician: Referring Physician: Montez Morita, MONICA Treating Physician/Extender: Altamese Maringouin in Treatment: 6 Education Assessment Education Provided To: Patient Education Topics Provided Wound/Skin Impairment: Handouts: Other: do not get wraps wet Electronic Signature(s) Signed: 06/09/2016 5:39:03 PM By: Alejandro Mulling Entered By: Alejandro Mulling on  06/09/2016 15:41:25 Clonch, [name] ([id number]) -------------------------------------------------------------------------------- Wound Assessment Details Patient Name: Dampier, [name] Date of Service: 06/09/2016 2:30 PM Medical Record Number: [id number] Patient Account Number: [id number] Date of Birth/Sex: January 04, 1946 (70 y.o. 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 Wounding Event: Pressure Injury Wound Status: Open Date Acquired: 12/11/2015 Comorbid Anemia, Hypertension, Type II Weeks Of Treatment: 6 History: Diabetes Clustered Wound: No Photos Photo Uploaded By: Alejandro Mulling on 06/09/2016 16:34:43 Wound Measurements Length: (cm) 2.2 Width: (cm) 1.8 Depth: (cm) 0.5 Area: (cm) 3.11 Volume: (cm) 1.555 % Reduction in Area: 47.8% % Reduction in Volume: -30.5% Epithelialization: None Tunneling: No Undermining: No Wound Description Classification: Category/Stage II Diabetic Severity [address]): Grade 1 Wound Margin: Thickened Exudate Amount: Large Exudate Type: Serous Exudate Color: amber Foul Odor After Cleansing: No Wound Bed Granulation Amount: Large (67-100%) Exposed Structure Granulation Quality: Red Fascia Exposed: No Necrotic Amount: Small (1-33%) Fat Layer Exposed: No Helget, Jerrion L. ([id number]) Necrotic Quality: Eschar, Adherent Slough Tendon Exposed: No Muscle Exposed: No Joint Exposed: No Bone Exposed: No Limited to Skin Breakdown Periwound Skin Texture Texture Color No Abnormalities Noted: No No Abnormalities Noted: No Moisture Temperature / Pain No Abnormalities Noted: No Temperature: No Abnormality Maceration: Yes Tenderness  on Palpation: Yes Moist: Yes Wound Preparation Ulcer Cleansing: Rinsed/Irrigated with Saline Topical Anesthetic Applied: Other: lidocaine 4%, Treatment Notes Wound #1 (Left Calcaneus) 1. Cleansed with: Clean wound with Normal Saline 2. Anesthetic Topical Lidocaine 4% cream to wound bed prior to debridement 3. Peri-wound Care: Skin Prep 4. Dressing Applied: Other dressing (specify in notes) 5. Secondary Dressing Applied Dry Gauze 7. Secured with Tape 2 Layer Lite Compression System - Bilateral Notes xtrasorb, charcoal, kerlix, coban, mepitel, collagen dressing, steri-strips, heel cups Electronic Signature(s) Signed: 06/09/2016 5:39:03 PM By: Alejandro Mulling Entered By: Alejandro Mulling on 06/09/2016 14:44:03 Sliney, [name] ([id number]) --------------------------------------------------------------------------------  Wound Assessment Details Patient Name: Raymond, [name] Date of Service: 06/09/2016 2:30 PM Medical Record Number: [id number] Patient Account Number: [id number] Date of Birth/Sex: 07/21/1946 (70 y.o. Male) Treating RN: Phillis Haggis Primary Care Physician: Montez Morita, MONICA Other Clinician: Referring Physician: Montez Morita, MONICA Treating Physician/Extender: Maxwell Caul Weeks in Treatment: 6 Wound Status Wound Number: 2 Primary Etiology: Pressure Ulcer Wound Location: Right Calcaneus - Lateral Secondary Diabetic Wound/Ulcer of the Lower Etiology: Extremity Wounding Event: Pressure Injury Wound Status: Open Date Acquired: 12/11/2015 Comorbid Anemia, Hypertension, Type II Weeks Of Treatment: 6 History: Diabetes Clustered Wound: No Photos Photo Uploaded By: Alejandro Mulling on 06/09/2016 16:34:43 Wound Measurements Length: (cm) 1.8 Width: (cm) 3 Depth: (cm) 0.9 Area: (cm) 4.241 Volume: (cm) 3.817 % Reduction in Area: 52.3% % Reduction in Volume: 14.1% Epithelialization: None Tunneling: No Undermining: No Wound Description Classification:  Category/Stage IV Diabetic Severity [address]): Grade 1 Wound Margin: Thickened Exudate Amount: Large Exudate Type: Serosanguineous Exudate Color: red, brown Foul Odor After Cleansing: No Wound Bed Granulation Amount: Large (67-100%) Exposed Structure Granulation Quality: Red Fascia Exposed: No Necrotic Amount: Small (1-33%) Fat Layer Exposed: No Gossett, [name] ([id number]) Necrotic Quality: Adherent Slough Tendon Exposed: Yes Muscle Exposed: Yes Necrosis of Muscle: No Joint Exposed: No Bone Exposed: Yes Periwound Skin Texture Texture Color No Abnormalities Noted: No No Abnormalities Noted: No Localized Edema: Yes Erythema: Yes Erythema Location: Circumferential Moisture No Abnormalities Noted: No Temperature / Pain Maceration: Yes Temperature: No Abnormality Moist: Yes Tenderness on Palpation: Yes Wound Preparation Ulcer Cleansing: Rinsed/Irrigated with Saline Topical Anesthetic Applied: Other: lidocaine 4%, Treatment Notes Wound #2 (Right, Lateral Calcaneus) 1. Cleansed with: Clean wound with Normal Saline 2. Anesthetic Topical Lidocaine 4% cream to wound bed prior to debridement 3. Peri-wound Care: Skin Prep 4. Dressing Applied: Other dressing (specify in notes) 5. Secondary Dressing Applied Dry Gauze 7. Secured with 2 Layer Lite Compression System - Bilateral Notes xtrasorb, charcoal, kerlix, coban, mepitel, collagen dressing, steri-strips, heel cups Electronic Signature(s) Signed: 06/09/2016 5:39:03 PM By: Alejandro Mulling Entered By: Alejandro Mulling on 06/09/2016 14:45:43 Pettinato, [name] ([id number]) -------------------------------------------------------------------------------- Vitals Details Patient Name: Hatlestad, [name] Date of Service: 06/09/2016 2:30 PM Medical Record Number: [id number] Patient Account Number: [id number] Date of Birth/Sex: [birth year] (70 y.o. Male) Treating RN: Phillis Haggis Primary Care Physician: Montez Morita, MONICA Other  Clinician: Referring Physician: Montez Morita, MONICA Treating Physician/Extender: Maxwell Caul Weeks in Treatment: 6 Vital Signs Time Taken: 14:21 Temperature (F): 97.7 Height (in): 65 Pulse (bpm): 87 Respiratory Rate (breaths/min): 18 Blood Pressure (mmHg): 110/47 Reference Range: 80 - 120 mg / dl Electronic Signature(s) Signed: 06/09/2016 5:39:03 PM By: Alejandro Mulling Entered By: Alejandro Mulling on 06/09/2016 14:24:02

## 2016-06-17 ENCOUNTER — Encounter: Payer: PRIVATE HEALTH INSURANCE | Attending: Surgery

## 2016-06-17 DIAGNOSIS — E11621 Type 2 diabetes mellitus with foot ulcer: Secondary | ICD-10-CM | POA: Diagnosis not present

## 2016-06-17 DIAGNOSIS — K7460 Unspecified cirrhosis of liver: Secondary | ICD-10-CM | POA: Insufficient documentation

## 2016-06-17 DIAGNOSIS — Z794 Long term (current) use of insulin: Secondary | ICD-10-CM | POA: Diagnosis not present

## 2016-06-17 DIAGNOSIS — I350 Nonrheumatic aortic (valve) stenosis: Secondary | ICD-10-CM | POA: Insufficient documentation

## 2016-06-17 DIAGNOSIS — L89623 Pressure ulcer of left heel, stage 3: Secondary | ICD-10-CM | POA: Diagnosis not present

## 2016-06-17 DIAGNOSIS — D61818 Other pancytopenia: Secondary | ICD-10-CM | POA: Insufficient documentation

## 2016-06-17 DIAGNOSIS — K7290 Hepatic failure, unspecified without coma: Secondary | ICD-10-CM | POA: Insufficient documentation

## 2016-06-17 DIAGNOSIS — K766 Portal hypertension: Secondary | ICD-10-CM | POA: Diagnosis not present

## 2016-06-17 DIAGNOSIS — L89614 Pressure ulcer of right heel, stage 4: Secondary | ICD-10-CM | POA: Diagnosis not present

## 2016-06-17 DIAGNOSIS — E1142 Type 2 diabetes mellitus with diabetic polyneuropathy: Secondary | ICD-10-CM | POA: Diagnosis not present

## 2016-06-17 NOTE — Progress Notes (Signed)
[name] NakaiSHORT, [name] ([id number]) Visit Report for 05/26/2016 Chief Complaint Document Details Patient Name: [name] Date of Service: 05/26/2016 1:30 PM Medical Record Patient Account Number: [id number] Number: Treating RN: [name], [name] [birth year] (70 y.o. Other Clinician: Date of Birth/Sex: Male) Treating [name] Primary Care [name]: Montez MoritaARTER, [name] [name]/Extender: [name]: [name] BoysARTER, [name] Weeks in Treatment: 4 Information Obtained from: Patient Chief Complaint Patient is here for our review of bilateral heel ulcers for the last 5-6 months Electronic Signature(s) Signed: 06/17/2016 7:33:08 AM By: [name] Najjarobson, Renso [name] Entered By: [name] Najjarobson, Maxen on 05/26/2016 14:29:13 Lisby, Marolyn HammockMICHAEL [name] -------------------------------------------------------------------------------- Cellular or Tissue Based Product Details Patient Name: [name] Date of Service: 05/26/2016 1:30 PM Medical Record Patient Account Number: [id number] Number: Treating RN: [name], [name] [birth year] (70 y.o. Other Clinician: Date of Birth/Sex: Male) Treating [name] Primary Care [name]: Montez MoritaARTER, [name] [name]/Extender: [name]: [name] BoysARTER, [name] Weeks in Treatment: 4 Cellular or Tissue Based Wound #1 Left Calcaneus Product Type Applied to: Performed By: [name] [name] Cellular or Tissue Based Apligraf Product Type: Time-Out Taken: Yes Location: genitalia / hands / feet / multiple digits Wound Size (sq cm): 4.14 Product Size (sq cm): 44 Waste Size (sq cm): 39.86 Waste Reason: wound size Amount of Product Applied (sq cm): 4.14 Lot #: [id number] Expiration Date: 05/27/2016 Fenestrated: Yes Instrument: Blade Reconstituted: No Secured: Yes Secured With: Steri-Strips Dressing Applied: Yes Primary Dressing: MEPITEL Procedural Pain: 0 Post Procedural Pain: 0 Response to Treatment: Procedure was  tolerated well Post Procedure Diagnosis Same as Pre-procedure Electronic Signature(s) Signed: 05/26/2016 5:18:41 PM By: [name] Entered By: [name] on 05/26/2016 15:10:17 [name] -------------------------------------------------------------------------------- HPI Details Patient Name: [name] Date of Service: 05/26/2016 1:30 PM Medical Record Patient Account Number: [id number] Number: Treating RN: [name], [name] [birth year] (70 y.o. Other Clinician: Date of Birth/Sex: Male) Treating [name] Primary Care [name]: Montez MoritaARTER, [name] [name]/Extender: [name]: [name] BoysARTER, [name] Weeks in Treatment: 4 History of Present Illness HPI Description: 04/27/16; this is a 70 year old man who is a resident of [facility] in [place]. He is accompanied by a family friend today. She tells me that he has had problems with pressure ulcers since December 2016 largely at a time where he apparently had diabetic hypoglycemic episodes. Apparently these became so severe that he had to be transferred to [facility] for operative debridement apparently bilaterally. I don't have any records from [facility] but per his friend they did not do imaging studies bone cultures arterial studies etc. Apparently the area just applying wet to dry dressings to these. The patient is a diabetic on insulin. His arterial studies here were noncompressible although his pulses could be dopplered. 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 At one point I note in his notes he was on hospice I'm not really sure whether that is true currently or not. 05/04/16; x-ray of the right heel done in his skilled facility was negative for significant bone  pathology. Arterial studies also done in the facility revealed noncompressible vessels therefore no ABI could be calculated. His arterial ultrasound showed low velocities scattered atherosclerotic disease negative for focal stenosis but monophasic waveforms bilaterally. The overall impression was "mild peripheral arterial disease" 05/11/16; culture last week of the right heel grew MRSA and group B strep. We started doxycycline  today. 05/19/16; I started the patient on doxycycline 8 days ago. He has not tolerated this well secondary to gastric irritation. He is also complaining of increasing pain in the right heel. 05/26/16; the patient is completing his Zyvox that I gave him last week. Culture grew heavy group B strep and a few Escherichia coli. Surprisingly did not culture any further MRSA. Apligraf #1 Electronic Signature(s) Signed: 06/17/2016 7:33:08 AM By: [name] Najjar [name] Entered By: [name] Najjar on 05/26/2016 14:30:03 [name] ([id number]) -------------------------------------------------------------------------------- Physical Exam Details Patient Name: [name] Date of Service: 05/26/2016 1:30 PM Medical Record Patient Account Number: [id number] Number: Treating RN: [name] Haggis [name] (70 y.o. Other Clinician: Date of Birth/Sex: Male) Treating [name] Primary Care [name]: Montez [name], [name] [name]/Extender: [name]: Montez [name], [name] Weeks in Treatment: 4 Notes Wound exam; substantial area on the right Achilles greater than left Achilles aspect of his bilateral calcaneus. Right heel looks a lot better after successful treatment of infection. The right heel wound is deep with only a thin layer of tissue over the calcaneus itself. Given this fact I went ahead and applied the Apligraf that we did not apply last week. This was applied to bilateral heels Electronic Signature(s) Signed: 06/17/2016 7:33:08 AM By: [name] Najjar  [name] Entered By: [name] Najjar on 05/26/2016 14:31:17 Brandner, Marolyn [name] ([id number]) -------------------------------------------------------------------------------- [name] Orders Details Patient Name: [name] Date of Service: 05/26/2016 1:30 PM Medical Record Patient Account Number: [id number] Number: Treating RN: [name] Haggis 18-Nov-1946 (70 y.o. Other Clinician: Date of Birth/Sex: Male) Treating ROBSON, Nalin Primary Care [name]: Montez [name], [name] [name]/Extender: [name]: [name] Weeks in Treatment: 4 Verbal / Phone Orders: Yes Clinician: Pinkerton, [name] Read Back and Verified: Yes Diagnosis Coding Wound Cleansing Wound #1 Left Calcaneus o Clean wound with Normal Saline. o No tub bath. o Other: - BEDBATH Wound #2 Right,Lateral Calcaneus o Clean wound with Normal Saline. o No tub bath. o Other: - BEDBATH Anesthetic Wound #1 Left Calcaneus o Topical Lidocaine 4% cream applied to wound bed prior to debridement - FOR CLINIC USE Wound #2 Right,Lateral Calcaneus o Topical Lidocaine 4% cream applied to wound bed prior to debridement - FOR CLINIC USE Skin Barriers/Peri-Wound Care Wound #1 Left Calcaneus o Skin Prep - IN CLINIC Wound #2 Right,Lateral Calcaneus o Skin Prep - IN CLINIC Primary Wound Dressing Wound #1 Left Calcaneus o Other: - APLIGRAPH Wound #2 Right,Lateral Calcaneus o Other: - APLIGRAPH Secondary Dressing Wound #1 Left Calcaneus [name] ([id number]) o ABD pad o Foam o XtraSorb - CHARCOAL o Mepitel One - STERI-STRIPS Wound #2 Right,Lateral Calcaneus o ABD pad o Foam o XtraSorb - CHARCOAL o Mepitel One - STERI-STRIPS Dressing Change Frequency Wound #1 Left Calcaneus o Other: - TWO WEEKS Wound #2 Right,Lateral Calcaneus o Change dressing every week - IN CLINIC Follow-up Appointments Wound #1 Left Calcaneus o Return Appointment in 1  week. Wound #2 Right,Lateral Calcaneus o Return Appointment in 1 week. - IN CLINIC Edema Control Wound #1 Left Calcaneus o 3 Layer Compression System - Bilateral - DO NOT GET WET o Elevate legs to the level of the heart and pump ankles as often as possible Wound #2 Right,Lateral Calcaneus o 3 Layer Compression System - Bilateral - DO NOT GET WET o Elevate legs to the level of the heart and pump ankles as often as possible Off-Loading Wound #1 Left Calcaneus o Turn and reposition every 2 hours o Other: - SAGE BOOTS Wound #2 Right,Lateral Calcaneus   o Turn and reposition every 2 hours o Other: - SAGE BOOTS Additional Orders / Instructions Wound #1 Left Calcaneus o Increase protein intake. Wilkinson, Matteus [name]. ([id number]) Wound #2 Right,Lateral Calcaneus o Increase protein intake. Electronic Signature(s) Signed: 05/26/2016 5:18:41 PM By: [name] Signed: 06/17/2016 7:33:08 AM By: [name] Najjarobson, Chett [name] Entered By: [name] on 05/26/2016 14:31:36 Deadwyler, Marolyn HammockMICHAEL [name]. ([id number]) -------------------------------------------------------------------------------- Problem List Details Patient Name: [name] Date of Service: 05/26/2016 1:30 PM Medical Record Patient Account Number: [id number] Number: Treating RN: [name], [name] [birth year] (70 y.o. 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 ([id number]) -------------------------------------------------------------------------------- Progress Note Details Patient Name: [name] Date of Service: 05/26/2016 1:30 PM Medical Record Patient Account Number: [id number] Number: Treating RN: [name], [name] [birth year] (70 y.o. Other Clinician: Date of Birth/Sex: Male) Treating ROBSON, Irven Primary Care [name]: Montez MoritaARTER, [name] [name]/Extender: [name]: [name] BoysARTER, [name] Weeks in Treatment: 4 Subjective Chief Complaint Information obtained from Patient Patient is here for our review of bilateral heel ulcers for the last 5-6 months History of Present Illness (HPI) 04/27/16; this is a 70 year old man who is a resident of [facility] in [place]. He is accompanied by a family friend today. She tells me that he has had problems with pressure ulcers since December 2016 largely at a time where he apparently had diabetic hypoglycemic episodes. Apparently these became so severe that he had to be transferred to [facility] for operative debridement apparently bilaterally. I don't have any records from [facility] but per his friend they did not do imaging studies bone cultures arterial studies etc. Apparently the area just applying wet to dry dressings to these. The patient is a diabetic on insulin. His arterial studies here were noncompressible although his pulses could be dopplered. 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 At one point I note in his notes he was on hospice I'm not really sure whether that is true currently  or not. 05/04/16; x-ray of the right heel done in his skilled facility was negative for significant bone pathology. Arterial studies also done in the facility revealed noncompressible vessels therefore no ABI could be calculated. His arterial ultrasound showed low velocities scattered atherosclerotic disease negative for focal stenosis but monophasic waveforms bilaterally. The overall impression was "mild peripheral arterial disease" 05/11/16; culture last week of the right heel grew MRSA and group B strep. We started doxycycline today. 05/19/16; I started the patient on doxycycline 8 days ago. He has not tolerated this well secondary to gastric irritation. He is also complaining of increasing pain in the right heel. 05/26/16; the patient is completing his Zyvox that I gave him last week. Culture grew heavy group B strep and a few Escherichia coli. Surprisingly did not culture any further MRSA. Apligraf #1 Objective Constitutional  Vitals Time Taken: 1:28 PM, Height: 65 in, Temperature: 98.1 F, Pulse: 79 bpm, Respiratory Rate: 18 breaths/min, Blood Pressure: 111/48 mmHg. [name] ([id number]) Integumentary (Hair, Skin) Wound #1 status is Open. Original cause of wound was Pressure Injury. The wound is located on the Left Calcaneus. The wound measures 1.8cm length x 2.3cm width x 0.7cm depth; 3.252cm^2 area and 2.276cm^3 volume. The wound is limited to skin breakdown. There is no tunneling or undermining noted. There is a large amount of serous drainage noted. The wound margin is thickened. There is large (67- 100%) red granulation within the wound bed. There is a small (1-33%) amount of necrotic tissue within the wound bed including Eschar and Adherent Slough. The periwound skin appearance exhibited: Moist. Periwound temperature was noted as No Abnormality. The periwound has tenderness on palpation. Wound #2 status is Open. Original cause of wound was Pressure Injury. The wound is located  on the Right,Lateral Calcaneus. The wound measures 2.1cm length x 3cm width x 0.9cm depth; 4.948cm^2 area and 4.453cm^3 volume. There is bone, muscle, and tendon exposed. There is no tunneling or undermining noted. There is a large amount of serosanguineous drainage noted. The wound margin is thickened. There is medium (34-66%) red granulation within the wound bed. There is a medium (34-66%) amount of necrotic tissue within the wound bed including Adherent Slough. The periwound skin appearance exhibited: Localized Edema, Moist, Erythema. The surrounding wound skin color is noted with erythema which is circumferential. Periwound temperature was noted as No Abnormality. The periwound has tenderness on palpation. Assessment Active Problems ICD-10 E11.621 - Type 2 diabetes mellitus with foot ulcer E11.42 - Type 2 diabetes mellitus with diabetic polyneuropathy L89.614 - Pressure ulcer of right heel, stage 4 L89.623 - Pressure ulcer of left heel, stage 3 I35.0 - Nonrheumatic aortic (valve) stenosis Procedures Wound #1 Wound #1 is a Pressure Ulcer located on the Left Calcaneus. A skin graft procedure using a bioengineered skin substitute/cellular or tissue based product was performed by [name] Caul, [name]. 40 sq cm of product was utilized and 4 sq cm was wasted due to left over. Post Application, adaptic was applied. The procedure was tolerated well with a pain level of 0 throughout and a pain level of 0 following the procedure. Post procedure Diagnosis Wound #1: Same as Pre-Procedure . [name] ([id number]) Plan Wound Cleansing: Wound #1 Left Calcaneus: Clean wound with Normal Saline. Wound #2 Right,Lateral Calcaneus: Clean wound with Normal Saline. Anesthetic: Wound #1 Left Calcaneus: Topical Lidocaine 4% cream applied to wound bed prior to debridement - FOR CLINIC USE Wound #2 Right,Lateral Calcaneus: Topical Lidocaine 4% cream applied to wound bed prior to debridement -  FOR CLINIC USE Skin Barriers/Peri-Wound Care: Wound #1 Left Calcaneus: Skin Prep Wound #2 Right,Lateral Calcaneus: Skin Prep Primary Wound Dressing: Wound #1 Left Calcaneus: Other: - APLIGRAPH Wound #2 Right,Lateral Calcaneus: Other: - APLIGRAPH Secondary Dressing: Wound #1 Left Calcaneus: ABD pad Foam XtraSorb Mepitel One - STERI-STRIPS Wound #2 Right,Lateral Calcaneus: ABD pad Foam XtraSorb Mepitel One - STERI-STRIPS Dressing Change Frequency: Wound #1 Left Calcaneus: Other: - TWO WEEKS Wound #2 Right,Lateral Calcaneus: Other: - TWO WEEKS Follow-up Appointments: Wound #1 Left Calcaneus: Return Appointment in 1 week. Wound #2 Right,Lateral Calcaneus: Return Appointment in 1 week. Edema Control: Wound #1 Left Calcaneus: 3 Layer Compression System - Bilateral Elevate legs to the level of the heart and pump ankles as often as possible [name] ([id number]) Wound #2 Right,Lateral Calcaneus: 3 Layer Compression System -  Bilateral Elevate legs to the level of the heart and pump ankles as often as possible Off-Loading: Wound #1 Left Calcaneus: Turn and reposition every 2 hours Other: - SAGE BOOTS Wound #2 Right,Lateral Calcaneus: Turn and reposition every 2 hours Other: - SAGE BOOTS Additional Orders / Instructions: Wound #1 Left Calcaneus: Increase protein intake. Wound #2 Right,Lateral Calcaneus: Increase protein intake. Apligraf #1,adaptic,foam,profore lite Electronic Signature(s) Signed: 06/17/2016 7:33:08 AM By: [name] Najjar [name] Entered By: [name] Najjar on 05/26/2016 14:33:25 Goldwasser, Marolyn [name] ([id number]) -------------------------------------------------------------------------------- SuperBill Details Patient Name: Deiss, [name] Date of Service: 05/26/2016 Medical Record Patient Account Number: [id number] Number: Treating RN: [name] Haggis 08-21-1946 (70 y.o. 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, stage 4 L89.623 Pressure ulcer of left heel, stage 3 I35.0 Nonrheumatic aortic (valve) stenosis Facility Procedures CPT4 Code: [id number] Description: 15271 - SKIN SUB GRAFT TRNK/ARM/LEG ICD-10 Description Diagnosis E11.621 Type 2 diabetes mellitus with foot ulcer Modifier: Quantity: 1 [name] Procedures CPT4 Code: [id number] Description: 15271 - WC PHYS SKIN SUB GRAFT TRNK/ARM/LEG ICD-10 Description Diagnosis E11.621 Type 2 diabetes mellitus with foot ulcer Modifier: Quantity: 1 Electronic Signature(s) Signed: 06/17/2016 7:33:08 AM By: [name] Najjar [name] Entered By: [name] Najjar on 05/26/2016 14:34:53

## 2016-06-17 NOTE — Progress Notes (Signed)
WANYA, [name] ([id number]) Visit Report for 05/26/2016 Arrival Information Details Patient Name: Keats, Wendy L. Date of Service: 05/26/2016 1:30 PM Medical Record Number: [id number] Patient Account Number: [id number] Date of Birth/Sex: July 08, 1946 (70 y.o. Male) Treating RN: Phillis Haggis Primary Care Physician: Montez Morita, MONICA Other Clinician: Referring Physician: Montez Morita, MONICA Treating Physician/Extender: Maxwell Caul Weeks in Treatment: 4 Visit Information History Since Last Visit All ordered tests and consults were completed: No Patient Arrived: Wheel Chair Added or deleted any medications: No Arrival Time: 13:28 Any new allergies or adverse reactions: No Accompanied By: friend Had a fall or experienced change in No Transfer Assistance: EasyPivot Patient activities of daily living that may affect Lift risk of falls: Patient Identification Verified: Yes Signs or symptoms of abuse/neglect since last No Secondary Verification Process Yes visito Completed: Hospitalized since last visit: No Patient Requires Transmission- No Pain Present Now: No Based Precautions: Patient Has Alerts: Yes Patient Alerts: DM II bil legs non- compressible Electronic Signature(s) Signed: 05/26/2016 5:18:41 PM By: Alejandro Mulling Entered By: Alejandro Mulling on 05/26/2016 13:28:26 Paino, Joaovictor L. ([id number]) -------------------------------------------------------------------------------- Encounter Discharge Information Details Patient Name: [name], [name] Date of Service: 05/26/2016 1:30 PM Medical Record Number: [id number] Patient Account Number: [id number] Date of Birth/Sex: 11-21-1946 (70 y.o. Male) Treating RN: Phillis Haggis Primary Care Physician: Montez Morita, MONICA Other Clinician: Referring Physician: Montez Morita, MONICA Treating Physician/Extender: Altamese [place] in Treatment: 4 Encounter Discharge Information Items Discharge Pain Level: 0 Discharge Condition:  Stable Ambulatory Status: Wheelchair Discharge Destination: Nursing Home Transportation: Other Accompanied By: FRIEND Schedule Follow-up Appointment: Yes Medication Reconciliation completed and provided to Patient/Care Yes Palmer Fahrner: Provided on Clinical Summary of Care: 05/26/2016 Form Type Recipient Paper Patient MS Electronic Signature(s) Signed: 05/26/2016 2:38:11 PM By: Gwenlyn Perking Entered By: Gwenlyn Perking on 05/26/2016 14:38:10 [name], [name] ([id number]) -------------------------------------------------------------------------------- Lower Extremity Assessment Details Patient Name: [name], [name] Date of Service: 05/26/2016 1:30 PM Medical Record Number: [id number] Patient Account Number: [id number] Date of Birth/Sex: 1946-09-29 (70 y.o. Male) Treating RN: Phillis Haggis Primary Care Physician: Montez Morita, MONICA Other Clinician: Referring Physician: Montez Morita, MONICA Treating Physician/Extender: Maxwell Caul Weeks in Treatment: 4 Vascular Assessment Pulses: Posterior Tibial Dorsalis Pedis Palpable: [Left:Yes] [Right:Yes] Extremity colors, hair growth, and conditions: Temperature of Extremity: [Left:Warm] [Right:Warm] Capillary Refill: [Left:< 3 seconds] [Right:< 3 seconds] Electronic Signature(s) Signed: 05/26/2016 5:18:41 PM By: Alejandro Mulling Entered By: Alejandro Mulling on 05/26/2016 13:33:25 [name], [name] ([id number]) -------------------------------------------------------------------------------- Multi Wound Chart Details Patient Name: [name], [name] Date of Service: 05/26/2016 1:30 PM Medical Record Number: [id number] Patient Account Number: [id number] Date of Birth/Sex: 1946-03-15 (70 y.o. Male) Treating RN: Phillis Haggis Primary Care Physician: Montez Morita, MONICA Other Clinician: Referring Physician: Montez Morita, MONICA Treating Physician/Extender: Maxwell Caul Weeks in Treatment: 4 Vital Signs Height(in): 65 Pulse(bpm): 79 Weight(lbs):  Blood Pressure 111/48 (mmHg): Body Mass Index(BMI): Temperature(F): 98.1 Respiratory Rate 18 (breaths/min): Photos: [1:No Photos] [2:No Photos] [N/A:N/A] Wound Location: [1:Left Calcaneus] [2:Right Calcaneus - Lateral] [N/A:N/A] Wounding Event: [1:Pressure Injury] [2:Pressure Injury] [N/A:N/A] Primary Etiology: [1:Pressure Ulcer] [2:Pressure Ulcer] [N/A:N/A] Secondary Etiology: [1:Diabetic Wound/Ulcer of the Lower Extremity] [2:Diabetic Wound/Ulcer of the Lower Extremity] [N/A:N/A] Comorbid History: [1:Anemia, Hypertension, Type II Diabetes] [2:Anemia, Hypertension, Type II Diabetes] [N/A:N/A] Date Acquired: [1:12/11/2015] [2:12/11/2015] [N/A:N/A] Weeks of Treatment: [1:4] [2:4] [N/A:N/A] Wound Status: [1:Open] [2:Open] [N/A:N/A] Measurements L x W x D 1.8x2.3x0.7 [2:2.1x3x0.9] [N/A:N/A] (cm) Area (cm) : [1:3.252] [2:4.948] [N/A:N/A] Volume (cm) : [1:2.276] [2:4.453] [N/A:N/A] % Reduction in Area: [1:45.40%] [2:44.30%] [N/A:N/A] % Reduction in Volume: -90.90% [2:-0.30%] [N/A:N/A]  Classification: [1:Category/Stage II] [2:Category/Stage IV] [N/A:N/A] HBO Classification: [1:Grade 1] [2:Grade 1] [N/A:N/A] Exudate Amount: [1:Large] [2:Large] [N/A:N/A] Exudate Type: [1:Serous] [2:Serosanguineous] [N/A:N/A] Exudate Color: [1:amber] [2:red, brown] [N/A:N/A] Wound Margin: [1:Thickened] [2:Thickened] [N/A:N/A] Granulation Amount: [1:Large (67-100%)] [2:Medium (34-66%)] [N/A:N/A] Granulation Quality: [1:Red] [2:Red] [N/A:N/A] Necrotic Amount: [1:Small (1-33%)] [2:Medium (34-66%)] [N/A:N/A] Necrotic Tissue: [1:Eschar, Adherent Slough] [2:Adherent Slough] [N/A:N/A] Exposed Structures: [1:Fascia: No Fat: No] [2:Tendon: Yes Muscle: Yes] [N/A:N/A] Tendon: No Bone: Yes Muscle: No Fascia: No Joint: No Fat: No Bone: No Joint: No Limited to Skin Breakdown Epithelialization: None None N/A Debridement: N/A Debridement ([id number] N/A [id number]) Time-Out Taken: N/A Yes N/A Pain Control: N/A  Lidocaine 4% Topical N/A Solution Tissue Debrided: N/A Fibrin/Slough, Exudates, N/A Subcutaneous Level: N/A Skin/Subcutaneous N/A Tissue Debridement Area (sq N/A 6.3 N/A cm): Instrument: N/A Curette N/A Bleeding: N/A Minimum N/A Hemostasis Achieved: N/A Pressure N/A Procedural Pain: N/A 0 N/A Post Procedural Pain: N/A 0 N/A Debridement Treatment N/A Procedure was tolerated N/A Response: well Post Debridement N/A 2.1x3x0.9 N/A Measurements L x W x D (cm) Post Debridement N/A 4.453 N/A Volume: (cm) Post Debridement N/A Category/Stage IV N/A Stage: Periwound Skin Texture: No Abnormalities Noted Edema: Yes N/A Periwound Skin Moist: Yes Moist: Yes N/A Moisture: Periwound Skin Color: No Abnormalities Noted Erythema: Yes N/A Erythema Location: N/A Circumferential N/A Temperature: No Abnormality No Abnormality N/A Tenderness on Yes Yes N/A Palpation: Wound Preparation: Ulcer Cleansing: Ulcer Cleansing: N/A Rinsed/Irrigated with Rinsed/Irrigated with Saline Saline Topical Anesthetic Topical Anesthetic Applied: Other: lidocaine Applied: Other: lidocaine 4% 4% Procedures Performed: N/A Debridement N/A [name], [name] ([id number]) Treatment Notes Electronic Signature(s) Signed: 05/26/2016 5:18:41 PM By: Alejandro Mulling Entered By: Alejandro Mulling on 05/26/2016 13:51:54 Janota, Marolyn Hammock ([id number]) -------------------------------------------------------------------------------- Multi-Disciplinary Care Plan Details Patient Name: [name], [name] Date of Service: 05/26/2016 1:30 PM Medical Record Number: [id number] Patient Account Number: [id number] Date of Birth/Sex: 02-02-1946 (70 y.o. Male) Treating RN: Phillis Haggis Primary Care Physician: Montez Morita, MONICA Other Clinician: Referring Physician: Montez Morita, MONICA Treating Physician/Extender: Altamese [place] in Treatment: 4 Active Inactive Abuse / Safety / Falls / Self Care Management Nursing  Diagnoses: Potential for falls Goals: Patient will remain injury free Date Initiated: 04/27/2016 Goal Status: Active Interventions: Assess fall risk on admission and as needed Assess self care needs on admission and as needed Notes: Nutrition Nursing Diagnoses: Imbalanced nutrition Goals: Patient/caregiver agrees to and verbalizes understanding of need to use nutritional supplements and/or vitamins as prescribed Date Initiated: 04/27/2016 Goal Status: Active Interventions: Assess patient nutrition upon admission and as needed per policy Notes: Orientation to the Wound Care Program Nursing Diagnoses: Knowledge deficit related to the wound healing center program Goals: Ridolfi, [name] Elbert Ewings ([id number]) Patient/caregiver will verbalize understanding of the Wound Healing Center Program Date Initiated: 04/27/2016 Goal Status: Active Interventions: Provide education on orientation to the wound center Notes: Pain, Acute or Chronic Nursing Diagnoses: Pain, acute or chronic: actual or potential Potential alteration in comfort, pain Goals: Patient will verbalize adequate pain control and receive pain control interventions during procedures as needed Date Initiated: 04/27/2016 Goal Status: Active Patient/caregiver will verbalize adequate pain control between visits Date Initiated: 04/27/2016 Goal Status: Active Interventions: Assess comfort goal upon admission Complete pain assessment as per visit requirements Notes: Pressure Nursing Diagnoses: Knowledge deficit related to causes and risk factors for pressure ulcer development Knowledge deficit related to management of pressures ulcers Goals: Patient will remain free from development of additional pressure ulcers Date Initiated: 04/27/2016 Goal Status: Active Interventions: Assess: immobility, friction, shearing, incontinence upon admission  and as needed Assess offloading mechanisms upon admission and as needed Assess potential  for pressure ulcer upon admission and as needed Notes: [name], [name] ([id number]) Wound/Skin Impairment Nursing Diagnoses: Impaired tissue integrity Goals: Ulcer/skin breakdown will have a volume reduction of 30% by week 4 Date Initiated: 04/27/2016 Goal Status: Active Ulcer/skin breakdown will have a volume reduction of 50% by week 8 Date Initiated: 04/27/2016 Goal Status: Active Ulcer/skin breakdown will have a volume reduction of 80% by week 12 Date Initiated: 04/27/2016 Goal Status: Active [id number]: Assess patient/caregiver ability to perform ulcer/skin care regimen upon admission and as needed Assess ulceration(s) every visit Notes: Electronic Signature(s) Signed: 05/26/2016 5:18:41 PM By: Alejandro MullingPinkerton, Debra Entered By: Alejandro MullingPinkerton, Debra on 05/26/2016 13:43:30 Butcher, Yvonne L. ([id number]) -------------------------------------------------------------------------------- Pain Assessment Details Patient Name: [name], [name] Date of Service: 05/26/2016 1:30 PM Medical Record Number: [id number] Patient Account Number: [id number] Date of Birth/Sex: 09/12/1946 [age] y.o. Male) Treating RN: Phillis HaggisPinkerton, Debi Primary Care Physician: Montez MoritaARTER, MONICA Other Clinician: Referring Physician: Montez MoritaARTER, MONICA Treating Physician/Extender: Maxwell CaulOBSON, Trai G Weeks in Treatment: 4 Active Problems Location of Pain Severity and Description of Pain Patient Has Paino No Site Locations Pain Management and Medication Current Pain Management: Electronic Signature(s) Signed: 05/26/2016 5:18:41 PM By: Alejandro MullingPinkerton, Debra Entered By: Alejandro MullingPinkerton, Debra on 05/26/2016 13:28:34 [name], [name] Elbert EwingsL. ([id number]) -------------------------------------------------------------------------------- Patient/Caregiver Education Details Patient Name: [name], [name] Date of Service: 05/26/2016 1:30 PM Medical Record Number: [id number] Patient Account Number: [id number] Date of Birth/Gender: 09/12/1946 [age] y.o.  Male) Treating RN: Phillis HaggisPinkerton, Debi Primary Care Physician: Montez MoritaARTER, MONICA Other Clinician: Referring Physician: Montez MoritaARTER, MONICA Treating Physician/Extender: Altamese CarolinaOBSON, Jadarious G Weeks in Treatment: 4 Education Assessment Education Provided To: Patient Education Topics Provided Wound/Skin Impairment: Handouts: Other: DO NOT GET WRAPS WET Methods: Demonstration, Explain/Verbal Responses: State content correctly Electronic Signature(s) Signed: 05/26/2016 5:18:41 PM By: Alejandro MullingPinkerton, Debra Entered By: Alejandro MullingPinkerton, Debra on 05/26/2016 14:37:20 Doss, Kolbi L. ([id number]) -------------------------------------------------------------------------------- Wound Assessment Details Patient Name: Marple, Taji L. Date of Service: 05/26/2016 1:30 PM Medical Record Number: [id number] Patient Account Number: [id number] Date of Birth/Sex: 09/12/1946 [age] y.o. 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 Wounding Event: Pressure Injury Wound Status: Open Date Acquired: 12/11/2015 Comorbid Anemia, Hypertension, Type II Weeks Of Treatment: 4 History: Diabetes Clustered Wound: No Photos Photo Uploaded By: Alejandro MullingPinkerton, Debra on 05/27/2016 07:55:34 Wound Measurements Length: (cm) 1.8 Width: (cm) 2.3 Depth: (cm) 0.7 Area: (cm) 3.252 Volume: (cm) 2.276 % Reduction in Area: 45.4% % Reduction in Volume: -90.9% Epithelialization: None Tunneling: No Undermining: No Wound Description Classification: Category/Stage II Diabetic Severity [address](Wagner): Grade 1 Wound Margin: Thickened Exudate Amount: Large Exudate Type: Serous Exudate Color: amber Foul Odor After Cleansing: No Wound  Bed Granulation Amount: Large (67-100%) Exposed Structure Granulation Quality: Red Fascia Exposed: No Necrotic Amount: Small (1-33%) Fat Layer Exposed: No Ivan, Keyen L. ([id number]) Necrotic Quality: Eschar, Adherent Slough Tendon Exposed: No Muscle Exposed: No Joint Exposed: No Bone Exposed: No Limited to Skin Breakdown Periwound Skin Texture Texture Color No Abnormalities Noted: No No Abnormalities Noted: No Moisture Temperature / Pain No Abnormalities Noted: No Temperature: No Abnormality Moist: Yes Tenderness on Palpation: Yes Wound Preparation Ulcer Cleansing: Rinsed/Irrigated with Saline Topical Anesthetic Applied: Other: lidocaine 4%, Electronic Signature(s) Signed: 05/26/2016 5:18:41 PM By: Alejandro MullingPinkerton, Debra Entered By: Alejandro MullingPinkerton, Debra on 05/26/2016 13:40:29  Corum, Tylique L. ([id number]) -------------------------------------------------------------------------------- Wound Assessment Details Patient Name: Nippert, Alani L. Date of Service: 05/26/2016 1:30 PM Medical Record Number: [id number] Patient Account Number: [id number] Date of Birth/Sex: 10/01/1946 [age] y.o. Male) Treating RN: Phillis HaggisPinkerton, Debi Primary Care Physician: Montez MoritaARTER, MONICA Other Clinician: Referring Physician: Montez MoritaARTER, MONICA Treating Physician/Extender: Maxwell CaulOBSON, Nancy G Weeks in Treatment: 4 Wound Status Wound Number: 2 Primary Etiology: Pressure Ulcer Wound Location: Right Calcaneus - Lateral Secondary Diabetic Wound/Ulcer of the Lower Etiology: Extremity Wounding Event: Pressure Injury Wound Status: Open Date Acquired: 12/11/2015 Comorbid Anemia, Hypertension, Type II Weeks Of Treatment: 4 History: Diabetes Clustered Wound: No Photos Photo Uploaded By: Alejandro MullingPinkerton, Debra on 05/27/2016 07:55:34 Wound Measurements Length: (cm) 2.1 Width: (cm) 3 Depth: (cm) 0.9 Area: (cm) 4.948 Volume: (cm) 4.453 % Reduction in Area: 44.3% % Reduction in Volume: -0.3% Epithelialization:  None Tunneling: No Undermining: No Wound Description Classification: Category/Stage IV Foul Odor Diabetic Severity [address](Wagner): Grade 1 Wound Margin: Thickened Exudate Amount: Large Exudate Type: Serosanguineous Exudate Color: red, brown After Cleansing: No Wound Bed Granulation Amount: Medium (34-66%) Exposed Structure Granulation Quality: Red Fascia Exposed: No Necrotic Amount: Medium (34-66%) Fat Layer Exposed: No Athey, Tobi L. ([id number]) Necrotic Quality: Adherent Slough Tendon Exposed: Yes Muscle Exposed: Yes Necrosis of Muscle: No Joint Exposed: No Bone Exposed: Yes Periwound Skin Texture Texture Color No Abnormalities Noted: No No Abnormalities Noted: No Localized Edema: Yes Erythema: Yes Erythema Location: Circumferential Moisture No Abnormalities Noted: No Temperature / Pain Moist: Yes Temperature: No Abnormality Tenderness on Palpation: Yes Wound Preparation Ulcer Cleansing: Rinsed/Irrigated with Saline Topical Anesthetic Applied: Other: lidocaine 4%, Electronic Signature(s) Signed: 05/26/2016 5:18:41 PM By: Alejandro MullingPinkerton, Debra Entered By: Alejandro MullingPinkerton, Debra on 05/26/2016 13:41:35 Ton, Dawaun L. ([id number]) -------------------------------------------------------------------------------- Vitals Details Patient Name: Wesche, Jack L. Date of Service: 05/26/2016 1:30 PM Medical Record Number: [id number] Patient Account Number: [id number] Date of Birth/Sex: 10/01/1946 [age] y.o. Male) Treating RN: Phillis HaggisPinkerton, Debi Primary Care Physician: Montez MoritaARTER, MONICA Other Clinician: Referring Physician: Montez MoritaARTER, MONICA Treating Physician/Extender: Maxwell CaulOBSON, Ajani G Weeks in Treatment: 4 Vital Signs Time Taken: 13:28 Temperature (F): 98.1 Height (in): 65 Pulse (bpm): 79 Respiratory Rate (breaths/min): 18 Blood Pressure (mmHg): 111/48 Reference Range: 80 - 120 mg / dl Electronic Signature(s) Signed: 05/26/2016 5:18:41 PM By: Alejandro MullingPinkerton, Debra Entered By: Alejandro MullingPinkerton,  Debra on 05/26/2016 13:31:32

## 2016-06-18 NOTE — Progress Notes (Signed)
[name], [name] ([id number]) Visit Report for 06/17/2016 Arrival Information Details Patient Name: [name], [name] Date of Service: 06/17/2016 1:30 PM Medical Record Number: [id number] Patient Account Number: [id number] Date of Birth/Sex: 11-26-1946 (70 y.o. Male) Treating RN: Phillis Haggis Primary Care Physician: Montez Morita, MONICA Other Clinician: Referring Physician: Montez Morita, MONICA Treating Physician/Extender: Rudene Re in Treatment: 7 Visit Information History Since Last Visit All ordered tests and consults were completed: No Patient Arrived: Wheel Chair Added or deleted any medications: No Arrival Time: 13:39 Any new allergies or adverse reactions: No Accompanied By: friend Had a fall or experienced change in No Transfer Assistance: EasyPivot Patient activities of daily living that may affect Lift risk of falls: Patient Identification Verified: Yes Signs or symptoms of abuse/neglect since last No Secondary Verification Process Yes visito Completed: Hospitalized since last visit: No Patient Requires Transmission- No Pain Present Now: No Based Precautions: Patient Has Alerts: Yes Patient Alerts: DM II bil legs non- compressible Electronic Signature(s) Signed: 06/17/2016 5:50:37 PM By: Alejandro Mulling Entered By: Alejandro Mulling on 06/17/2016 13:40:10 Divito, Norrin L. ([id number]) -------------------------------------------------------------------------------- Clinic Level of Care Assessment Details Patient Name: [name], [name] Date of Service: 06/17/2016 1:30 PM Medical Record Number: [id number] Patient Account Number: [id number] Date of Birth/Sex: 09-Oct-1946 (70 y.o. Male) Treating RN: Phillis Haggis Primary Care Physician: Montez Morita, MONICA Other Clinician: Referring Physician: Montez Morita, MONICA Treating Physician/Extender: Rudene Re in Treatment: 7 Clinic Level of Care Assessment Items TOOL 4 Quantity Score X - Use when only an EandM is performed  on FOLLOW-UP visit 1 0 ASSESSMENTS - Nursing Assessment / Reassessment X - Reassessment of Co-morbidities (includes updates in patient status) 1 10 X - Reassessment of Adherence to Treatment Plan 1 5 ASSESSMENTS - Wound and Skin Assessment / Reassessment  - Simple Wound Assessment / Reassessment - one wound 0 X - Complex Wound Assessment / Reassessment - multiple wounds 2 5  - Dermatologic / Skin Assessment (not related to wound area) 0 ASSESSMENTS - Focused Assessment  - Circumferential Edema Measurements - multi extremities 0  - Nutritional Assessment / Counseling / Intervention 0  - Lower Extremity Assessment (monofilament, tuning fork, pulses) 0  - Peripheral Arterial Disease Assessment (using hand held doppler) 0 ASSESSMENTS - Ostomy and/or Continence Assessment and Care  - Incontinence Assessment and Management 0  - Ostomy Care Assessment and Management (repouching, etc.) 0 PROCESS - Coordination of Care  - Simple Patient / Family Education for ongoing care 0 X - Complex (extensive) Patient / Family Education for ongoing care 1 20  - Staff obtains Chiropractor, Records, Test Results / Process Orders 0 X - Staff telephones HHA, Nursing Homes / Clarify orders / etc 1 10  - Routine Transfer to another Facility (non-emergent condition) 0 [name], [name] ([id number])  - Routine Hospital Admission (non-emergent condition) 0  - New Admissions / Manufacturing engineer / Ordering NPWT, Apligraf, etc. 0  - Emergency Hospital Admission (emergent condition) 0 X - Simple Discharge Coordination 1 10  - Complex (extensive) Discharge Coordination 0 PROCESS - Special Needs  - Pediatric / Minor Patient Management 0  - Isolation Patient Management 0  - Hearing / Language / Visual special needs 0  - Assessment of Community assistance (transportation, D/C planning, etc.) 0  - Additional assistance / Altered mentation 0  - Support Surface(s) Assessment (bed,  cushion, seat, etc.) 0 INTERVENTIONS - Wound Cleansing / Measurement  - Simple Wound Cleansing - one wound 0  - Complex Wound Cleansing - multiple wounds 0 X -  Wound Imaging (photographs - any number of wounds) 1 5 []  - Wound Tracing (instead of photographs) 0 []  - Simple Wound Measurement - one wound 0 []  - Complex Wound Measurement - multiple wounds 0 INTERVENTIONS - Wound Dressings []  - Small Wound Dressing one or multiple wounds 0 []  - Medium Wound Dressing one or multiple wounds 0 X - Large Wound Dressing one or multiple wounds 2 20 []  - Application of Medications - topical 0 []  - Application of Medications - injection 0 INTERVENTIONS - Miscellaneous []  - External ear exam 0 [name], [name] ([id number]) []  - Specimen Collection (cultures, biopsies, blood, body fluids, etc.) 0 []  - Specimen(s) / Culture(s) sent or taken to Lab for analysis 0 []  - Patient Transfer (multiple staff / Michiel SitesHoyer Lift / Similar devices) 0 []  - Simple Staple / Suture removal (25 or less) 0 []  - Complex Staple / Suture removal (26 or more) 0 []  - Hypo / Hyperglycemic Management (close monitor of Blood Glucose) 0 []  - Ankle / Brachial Index (ABI) - do not check if billed separately 0 X - Vital Signs 1 5 Has the patient been seen at the hospital within the last three years: Yes Total Score: 115 Level Of Care: New/Established - Level 3 Electronic Signature(s) Signed: 06/17/2016 5:50:37 PM By: Alejandro MullingPinkerton, Debra Entered By: Alejandro MullingPinkerton, Debra on 06/17/2016 17:28:20 Heaphy, Enzo L. ([id number]) -------------------------------------------------------------------------------- Encounter Discharge Information Details Patient Name: [name], [name] Date of Service: 06/17/2016 1:30 PM Medical Record Number: [id number] Patient Account Number: [id number] Date of Birth/Sex: Jan 20, 1946 [age] y.o. Male) Treating RN: Phillis HaggisPinkerton, Debi Primary Care Physician: Montez MoritaARTER, MONICA Other Clinician: Referring Physician: Montez MoritaARTER,  MONICA Treating Physician/Extender: Rudene ReBritto, Errol Weeks in Treatment: 7 Encounter Discharge Information Items Discharge Pain Level: 0 Discharge Condition: Stable Ambulatory Status: Wheelchair Nursing Discharge Destination: Home Transportation: Private Auto Accompanied By: friend Schedule Follow-up Appointment: Yes Medication Reconciliation completed Yes and provided to Patient/Care Natassia Guthridge: Clinical Summary of Care: Electronic Signature(s) Signed: 06/17/2016 5:50:37 PM By: Alejandro MullingPinkerton, Debra Entered By: Alejandro MullingPinkerton, Debra on 06/17/2016 14:16:48 Downum, [name] ([id number]) -------------------------------------------------------------------------------- Patient/Caregiver Education Details Patient Name: Reisz, Humbert L. Date of Service: 06/17/2016 1:30 PM Medical Record Number: [id number] Patient Account Number: [id number] Date of Birth/Gender: Jan 20, 1946 [age] y.o. Male) Treating RN: Phillis HaggisPinkerton, Debi Primary Care Physician: Montez MoritaARTER, MONICA Other Clinician: Referring Physician: Montez MoritaARTER, MONICA Treating Physician/Extender: Rudene ReBritto, Errol Weeks in Treatment: 7 Education Assessment Education Provided To: Patient Education Topics Provided Wound/Skin Impairment: Handouts: Other: do not get wraps wet Methods: Explain/Verbal Responses: State content correctly Electronic Signature(s) Signed: 06/17/2016 5:50:37 PM By: Alejandro MullingPinkerton, Debra Entered By: Alejandro MullingPinkerton, Debra on 06/17/2016 14:16:32 Ramo, Wilgus L. ([id number]) -------------------------------------------------------------------------------- Wound Assessment Details Patient Name: Kliebert, Jrue L. Date of Service: 06/17/2016 1:30 PM Medical Record Number: [id number] Patient Account Number: [id number] Date of Birth/Sex: Jan 20, 1946 [age] y.o. 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 Etiology: Lower Extremity Date Acquired: 12/11/2015 Wound Status: Open Weeks Of Treatment: 7 Comorbid Anemia, Hypertension, Type II Clustered Wound: No History: Diabetes Photos Photo Uploaded By: Alejandro MullingPinkerton, Debra on 06/17/2016 17:38:18 Wound Measurements Length: (cm) 2.2 Width: (cm) 1.8 Depth: (cm) 0.5 Area: (cm) 3.11 Volume: (cm) 1.555 % Reduction in Area: 47.8% % Reduction in Volume: -30.5% Epithelialization: None Tunneling: No Undermining: No Wound Description Classification: Category/Stage II Diabetic Severity [address](Wagner): Grade 1  Wound Margin: Thickened Exudate Amount: Large Exudate Type: Serous Exudate Color: amber Foul Odor After Cleansing: No Wound Bed Granulation Amount: Large (67-100%) Exposed Structure Granulation Quality: Red Fascia Exposed: No Necrotic Amount: Small (1-33%) Fat Layer Exposed: No Schaum, Torian L. ([id number]) Necrotic Quality: Eschar, Adherent Slough Tendon Exposed: No Muscle Exposed: No Joint Exposed: No Bone Exposed: No Limited to Skin Breakdown Periwound Skin Texture Texture Color No Abnormalities Noted: No No Abnormalities Noted: No Moisture Temperature / Pain No Abnormalities Noted: No Temperature: No Abnormality Maceration: Yes Tenderness on Palpation: Yes Moist: Yes Wound Preparation Ulcer Cleansing: Rinsed/Irrigated with Saline Topical Anesthetic Applied: Other: lidocaine 4%, Assessment Notes unable to measure this is wound check for apligraph Treatment Notes Wound #1 (Left Calcaneus) 5. Secondary Dressing Applied ABD Pad Dry Gauze Notes xtrasorb, drawtex, charcoal, kerlix. coaban, tape Electronic Signature(s) Signed: 06/17/2016 5:50:37 PM By: Alejandro MullingPinkerton, Debra Entered By: Alejandro MullingPinkerton, Debra on 06/17/2016 13:54:03 Moritz, Kollyn L.  ([id number]) -------------------------------------------------------------------------------- Wound Assessment Details Patient Name: Granja, Minh L. Date of Service: 06/17/2016 1:30 PM Medical Record Number: [id number] Patient Account Number: [id number] Date of Birth/Sex: [birth year] [age] y.o. 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 Etiology: Lower Extremity Date Acquired: 12/11/2015 Wound Status: Open Weeks Of Treatment: 7 Comorbid Anemia, Hypertension, Type II Clustered Wound: No History: Diabetes Photos Photo Uploaded By: Alejandro MullingPinkerton, Debra on 06/17/2016 17:38:18 Wound Measurements Length: (cm) 1.8 Width: (cm) 3 Depth: (cm) 0.9 Area: (cm) 4.241 Volume: (cm) 3.817 % Reduction in Area: 52.3% % Reduction in Volume: 14.1% Epithelialization: None Tunneling: No Undermining: No Wound Description Classification: Category/Stage IV Foul Odor Af Diabetic Severity [address](Wagner): Grade 1 Wound Margin: Thickened Exudate Amount: Large Exudate Type: Serosanguineous Exudate Color: red, brown ter Cleansing: No Wound Bed Granulation Amount: Large (67-100%) Exposed Structure Granulation Quality: Red Fascia Exposed: No Necrotic Amount: Small (1-33%) Fat Layer Exposed: No Haston, Bubba L. ([id number]) Necrotic Quality: Adherent Slough Tendon Exposed: Yes Muscle Exposed: Yes Necrosis of Muscle: No Joint Exposed: No Bone Exposed: Yes Periwound Skin Texture Texture Color No Abnormalities Noted: No No Abnormalities Noted: No Localized Edema: Yes Erythema: Yes Erythema Location: Circumferential Moisture No Abnormalities Noted: No Temperature / Pain Maceration:  Yes Temperature: No Abnormality Moist: Yes Tenderness on Palpation: Yes Wound Preparation Ulcer Cleansing: Rinsed/Irrigated with Saline Topical Anesthetic Applied: Other: lidocaine 4%, Assessment Notes unable to measure wound this is a wound check for apligraph Treatment Notes Wound #2 (Right, Lateral Calcaneus) 5. Secondary Dressing Applied ABD Pad Dry Gauze Notes xtrasorb, drawtex, charcoal, kerlix. coaban, tape Electronic Signature(s) Signed: 06/17/2016 5:50:37 PM By: Alejandro MullingPinkerton, Debra Entered By: Alejandro MullingPinkerton, Debra on 06/17/2016 13:54:42

## 2016-06-23 ENCOUNTER — Encounter: Payer: PRIVATE HEALTH INSURANCE | Admitting: Nurse Practitioner

## 2016-06-23 DIAGNOSIS — E11621 Type 2 diabetes mellitus with foot ulcer: Secondary | ICD-10-CM | POA: Diagnosis not present

## 2016-06-24 NOTE — Progress Notes (Addendum)
[name], [name] ([id number]) Visit Report for 06/23/2016 Arrival Information Details Patient Name: [name], [name] Date of Service: 06/23/2016 12:45 PM Medical Record Number: [id number] Patient Account Number: [id number] Date of Birth/Sex: 01-Jul-1946 [age] y.o. Male) Treating RN: Phillis HaggisPinkerton, Debi Primary Care Physician: Montez MoritaARTER, MONICA Other Clinician: Referring Physician: Montez MoritaARTER, MONICA Treating Physician/Extender: Eugene GarnetSaunders, Sharon Weeks in Treatment: 8 Visit Information History Since Last Visit All ordered tests and consults were completed: No Patient Arrived: Wheel Chair Added or deleted any medications: No Arrival Time: 12:50 Any new allergies or adverse reactions: No Accompanied By: sister Had a fall or experienced change in No Transfer Assistance: EasyPivot Patient activities of daily living that may affect Lift risk of falls: Patient Identification Verified: Yes Signs or symptoms of abuse/neglect since last No Secondary Verification Process Yes visito Completed: Hospitalized since last visit: No Patient Requires Transmission- No Pain Present Now: No Based Precautions: Patient Has Alerts: Yes Patient Alerts: DM II bil legs non- compressible Electronic Signature(s) Signed: 06/23/2016 5:18:57 PM By: Alejandro MullingPinkerton, Debra Entered By: Alejandro MullingPinkerton, Debra on 06/23/2016 12:50:42 [name], [name] ([id number]) -------------------------------------------------------------------------------- Encounter Discharge Information Details Patient Name: [name], [name] Date of Service: 06/23/2016 12:45 PM Medical Record Number: [id number] Patient Account Number: [id number] Date of Birth/Sex: 01-Jul-1946 [age] y.o. Male) Treating RN: Phillis HaggisPinkerton, Debi Primary Care Physician: Montez MoritaARTER, MONICA Other Clinician: Referring Physician: Montez MoritaARTER, MONICA Treating Physician/Extender: Eugene GarnetSaunders, Sharon Weeks in Treatment: 8 Encounter Discharge Information Items Discharge Pain Level: 0 Discharge Condition:  Stable Ambulatory Status: Wheelchair Discharge Destination: Nursing Home Transportation: Private Auto Accompanied By: sister Schedule Follow-up Appointment: Yes Medication Reconciliation completed and provided to Patient/Care Yes Baljit Liebert: Provided on Clinical Summary of Care: 06/23/2016 Form Type Recipient Paper Patient MS Electronic Signature(s) Signed: 06/23/2016 2:15:12 PM By: Gwenlyn PerkingMoore, Shelia Entered By: Gwenlyn PerkingMoore, Shelia on 06/23/2016 14:15:11 [name], [name] ([id number]) -------------------------------------------------------------------------------- Lower Extremity Assessment Details Patient Name: [name], [name] Date of Service: 06/23/2016 12:45 PM Medical Record Number: [id number] Patient Account Number: [id number] Date of Birth/Sex: 01-Jul-1946 [age] y.o. Male) Treating RN: Phillis HaggisPinkerton, Debi Primary Care Physician: Montez MoritaARTER, MONICA Other Clinician: Referring Physician: Montez MoritaARTER, MONICA Treating Physician/Extender: Eugene GarnetSaunders, Sharon Weeks in Treatment: 8 Vascular Assessment Pulses: Posterior Tibial Dorsalis Pedis Palpable: [Left:No] [Right:No] Doppler: [Left:Monophasic] [Right:Monophasic] Extremity colors, hair growth, and conditions: Extremity Color: [Left:Normal] [Right:Normal] Temperature of Extremity: [Left:Warm] [Right:Warm] Capillary Refill: [Left:< 3 seconds] [Right:< 3 seconds] Electronic Signature(s) Signed: 06/23/2016 5:18:57 PM By: Alejandro MullingPinkerton, Debra Entered By: Alejandro MullingPinkerton, Debra on 06/23/2016 13:04:54 [name], [name] ([id number]) -------------------------------------------------------------------------------- Multi Wound Chart Details Patient Name: [name], [name] Date of Service: 06/23/2016 12:45 PM Medical Record Number: [id number] Patient Account Number: [id number] Date of Birth/Sex: 01-Jul-1946 [age] y.o. Male) Treating RN: Phillis HaggisPinkerton, Debi Primary Care Physician: Montez MoritaARTER, MONICA Other Clinician: Referring Physician: Montez MoritaARTER, MONICA Treating Physician/Extender:  Eugene GarnetSaunders, Sharon Weeks in Treatment: 8 Photos: [1:No Photos] [2:No Photos] [N/A:N/A] Wound Location: [1:Left Calcaneus] [2:Right Calcaneus - Lateral] [N/A:N/A] Wounding Event: [1:Pressure Injury] [2:Pressure Injury] [N/A:N/A] Primary Etiology: [1:Pressure Ulcer] [2:Pressure Ulcer] [N/A:N/A] Secondary Etiology: [1:Diabetic Wound/Ulcer of the Lower Extremity] [2:Diabetic Wound/Ulcer of the Lower Extremity] [N/A:N/A] Comorbid History: [1:Anemia, Hypertension, Type II Diabetes] [2:Anemia, Hypertension, Type II Diabetes] [N/A:N/A] Date Acquired: [1:12/11/2015] [2:12/11/2015] [N/A:N/A] Weeks of Treatment: [1:8] [2:8] [N/A:N/A] Wound Status: [1:Open] [2:Open] [N/A:N/A] Measurements L x W x D 1.3x1x0.3 [2:1.5x3.3x0.4] [N/A:N/A] (cm) Area (cm) : [1:1.021] [2:3.888] [N/A:N/A] Volume (cm) : [1:0.306] [2:1.555] [N/A:N/A] % Reduction in Area: [1:82.90%] [2:56.20%] [N/A:N/A] % Reduction in Volume: 74.30% [2:65.00%] [N/A:N/A] Classification: [1:Category/Stage II] [2:Category/Stage IV] [N/A:N/A] HBO Classification: [1:Grade 1] [2:Grade 1] [N/A:N/A] Exudate Amount: [1:Large] [  2:Large] [N/A:N/A] Exudate Type: [1:Serosanguineous] [2:Serosanguineous] [N/A:N/A] Exudate Color: [1:red, brown] [2:red, brown] [N/A:N/A] Wound Margin: [1:Thickened] [2:Thickened] [N/A:N/A] Granulation Amount: [1:Large (67-100%)] [2:Large (67-100%)] [N/A:N/A] Granulation Quality: [1:Red] [2:Red] [N/A:N/A] Necrotic Amount: [1:Small (1-33%)] [2:Small (1-33%)] [N/A:N/A] Necrotic Tissue: [1:Eschar, Adherent Slough] [2:Adherent Slough] [N/A:N/A] Exposed Structures: [1:Fascia: No Fat: No Tendon: No Muscle: No Joint: No Bone: No Limited to Skin Breakdown] [2:Tendon: Yes Muscle: Yes Bone: Yes Fascia: No Fat: No Joint: No] [N/A:N/A] Epithelialization: [1:None] [2:None] [N/A:N/A] Periwound Skin Texture: No Abnormalities Noted [2:Edema: Yes] [N/A:N/A N/A] Periwound Skin Maceration: Yes Maceration: Yes Moisture: Moist: Yes Moist:  Yes Periwound Skin Color: No Abnormalities Noted Erythema: Yes N/A Erythema Location: N/A Circumferential N/A Temperature: No Abnormality No Abnormality N/A Tenderness on Yes Yes N/A Palpation: Wound Preparation: Ulcer Cleansing: Ulcer Cleansing: N/A Rinsed/Irrigated with Rinsed/Irrigated with Saline Saline Topical Anesthetic Topical Anesthetic Applied: Other: lidocaine Applied: Other: lidocaine 4% 4% Treatment Notes Electronic Signature(s) Signed: 06/23/2016 5:18:57 PM By: Alejandro Mulling Entered By: Alejandro Mulling on 06/23/2016 13:10:26 Knoke, [name] ([id number]) -------------------------------------------------------------------------------- Multi-Disciplinary Care Plan Details Patient Name: Gaughan, Aedin L. Date of Service: 06/23/2016 12:45 PM Medical Record Number: [id number] Patient Account Number: [id number] Date of Birth/Sex: [birth year] (70 y.o. Male) Treating RN: Phillis Haggis Primary Care Physician: Montez Morita, MONICA Other Clinician: Referring Physician: Montez Morita, MONICA Treating Physician/Extender: Eugene Garnet in Treatment: 8 Active Inactive Abuse / Safety / Falls / Self Care Management Nursing Diagnoses: Potential for falls Goals: Patient will remain injury free Date Initiated: 04/27/2016 Goal Status: Active Interventions: Assess fall risk on admission and as needed Assess self care needs on admission and as needed Notes: Nutrition Nursing Diagnoses: Imbalanced nutrition Goals: Patient/caregiver agrees to and verbalizes understanding of need to use nutritional supplements and/or vitamins as prescribed Date Initiated: 04/27/2016 Goal Status: Active Interventions: Assess patient nutrition upon admission and as needed per policy Notes: Orientation to the Wound Care Program Nursing Diagnoses: Knowledge deficit related to the wound healing center program Goals: Dierks, Tsuneo Elbert Ewings ([id number]) Patient/caregiver will verbalize understanding of  the Wound Healing Center Program Date Initiated: 04/27/2016 Goal Status: Active Interventions: Provide education on orientation to the wound center Notes: Pain, Acute or Chronic Nursing Diagnoses: Pain, acute or chronic: actual or potential Potential alteration in comfort, pain Goals: Patient will verbalize adequate pain control and receive pain control interventions during procedures as needed Date Initiated: 04/27/2016 Goal Status: Active Patient/caregiver will verbalize adequate pain control between visits Date Initiated: 04/27/2016 Goal Status: Active Interventions: Assess comfort goal upon admission Complete pain assessment as per visit requirements Notes: Pressure Nursing Diagnoses: Knowledge deficit related to causes and risk factors for pressure ulcer development Knowledge deficit related to management of pressures ulcers Goals: Patient will remain free from development of additional pressure ulcers Date Initiated: 04/27/2016 Goal Status: Active Interventions: Assess: immobility, friction, shearing, incontinence upon admission and as needed Assess offloading mechanisms upon admission and as needed Assess potential for pressure ulcer upon admission and as needed Notes: Tocci, Jerardo L. ([id number]) Wound/Skin Impairment Nursing Diagnoses: Impaired tissue integrity Goals: Ulcer/skin breakdown will have a volume reduction of 30% by week 4 Date Initiated: 04/27/2016 Goal Status: Active Ulcer/skin breakdown will have a volume reduction of 50% by week 8 Date Initiated: 04/27/2016 Goal Status: Active Ulcer/skin breakdown will have a volume reduction of 80% by week 12 Date Initiated: 04/27/2016 Goal Status: Active Interventions: Assess patient/caregiver ability to perform ulcer/skin care regimen upon admission and as needed Assess ulceration(s) every visit Notes: Electronic Signature(s) Signed: 06/23/2016 5:18:57 PM By: Alejandro Mulling Entered By:  Alejandro Mulling on  06/23/2016 13:10:20 [name], [name] ([id number]) -------------------------------------------------------------------------------- Pain Assessment Details Patient Name: [name], [name] Date of Service: 06/23/2016 12:45 PM Medical Record Number: [id number] Patient Account Number: [id number] Date of Birth/Sex: [birth year] (70 y.o. Male) Treating RN: Phillis Haggis Primary Care Physician: Montez Morita, MONICA Other Clinician: Referring Physician: Montez Morita, MONICA Treating Physician/Extender: Eugene Garnet in Treatment: 8 Active Problems Location of Pain Severity and Description of Pain Patient Has Paino No Site Locations With Dressing Change: No Pain Management and Medication Current Pain Management: Notes Topical or injectable lidocaine is offered to patient for acute pain when surgical debridement is performed. If needed, Patient is instructed to use over the counter pain medication for the following 24-48 hours after debridement. Wound care MDs do not prescribed pain medications. Electronic Signature(s) Signed: 06/23/2016 5:18:57 PM By: Alejandro Mulling Entered By: Alejandro Mulling on 06/23/2016 12:51:03 Eblen, [name] ([id number]) -------------------------------------------------------------------------------- Patient/Caregiver Education Details Patient Name: Lamarque, Kyuss L. Date of Service: 06/23/2016 12:45 PM Medical Record Number: [id number] Patient Account Number: [id number] Date of Birth/Gender: [birth year] (70 y.o. Male) Treating RN: Phillis Haggis Primary Care Physician: Montez Morita, MONICA Other Clinician: Referring Physician: Montez Morita, MONICA Treating Physician/Extender: Eugene Garnet in Treatment: 8 Education Assessment Education Provided To: Patient Education Topics Provided Wound/Skin Impairment: Handouts: Other: do not get wraps wet Methods: Demonstration, Explain/Verbal Responses: State content correctly Electronic Signature(s) Signed: 06/23/2016  5:18:57 PM By: Alejandro Mulling Entered By: Alejandro Mulling on 06/23/2016 13:21:01 Buonocore, Rohin L. ([id number]) -------------------------------------------------------------------------------- Wound Assessment Details Patient Name: [name], [name] Date of Service: 06/23/2016 12:45 PM Medical Record Number: [id number] Patient Account Number: [id number] Date of Birth/Sex: 1946/06/15 (70 y.o. 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 Wounding Event: Pressure Injury Wound Status: Open Date Acquired: 12/11/2015 Comorbid Anemia, Hypertension, Type II Weeks Of Treatment: 8 History: Diabetes Clustered Wound: No Photos Photo Uploaded By: Alejandro Mulling on 06/23/2016 16:48:06 Wound Measurements Length: (cm) 1.3 Width: (cm) 1 Depth: (cm) 0.3 Area: (cm) 1.021 Volume: (cm) 0.306 % Reduction in Area: 82.9% % Reduction in Volume: 74.3% Epithelialization: None Tunneling: No Undermining: No Wound Description Classification: Category/Stage II Foul Odor Aft Diabetic Severity [address]): Grade 1 Wound Margin: Thickened Exudate Amount: Large Exudate Type: Serosanguineous Exudate Color: red, brown er Cleansing: No Wound Bed Granulation Amount: Large (67-100%) Exposed Structure Granulation Quality: Red Fascia Exposed: No Necrotic Amount: Small (1-33%) Fat Layer Exposed: No Valverde, Adolphe L. ([id number]) Necrotic Quality: Eschar, Adherent Slough Tendon Exposed: No Muscle Exposed: No Joint Exposed: No Bone Exposed: No Limited to Skin Breakdown Periwound Skin Texture Texture Color No Abnormalities Noted: No No Abnormalities Noted: No Moisture Temperature / Pain No  Abnormalities Noted: No Temperature: No Abnormality Maceration: Yes Tenderness on Palpation: Yes Moist: Yes Wound Preparation Ulcer Cleansing: Rinsed/Irrigated with Saline Topical Anesthetic Applied: Other: lidocaine 4%, Treatment Notes Wound #1 (Left Calcaneus) 1. Cleansed with: Cleanse wound with antibacterial soap and water 2. Anesthetic Topical Lidocaine 4% cream to wound bed prior to debridement 4. Dressing Applied: Other dressing (specify in notes) 5. Secondary Dressing Applied ABD Pad Dry Gauze 7. Secured with Tape Notes kerlix, coban, charcoal, drawtex, Collagen dressing Electronic Signature(s) Signed: 06/23/2016 5:18:57 PM By: Alejandro Mulling Entered By: Alejandro Mulling on 06/23/2016 13:07:05 [name], [name] ([id number]) -------------------------------------------------------------------------------- Wound Assessment Details Patient Name: Riendeau, Kavan L. Date  of Service: 06/23/2016 12:45 PM Medical Record Number: [id number] Patient Account Number: [id number] Date of Birth/Sex: Jul 02, 1946 (70 y.o. Male) Treating RN: Phillis Haggis Primary Care Physician: Montez Morita, MONICA Other Clinician: Referring Physician: Montez Morita, MONICA Treating Physician/Extender: Eugene Garnet in Treatment: 8 Wound Status Wound Number: 2 Primary Etiology: Pressure Ulcer Wound Location: Right Calcaneus - Lateral Secondary Diabetic Wound/Ulcer of the Lower Etiology: Extremity Wounding Event: Pressure Injury Wound Status: Open Date Acquired: 12/11/2015 Comorbid Anemia, Hypertension, Type II Weeks Of Treatment: 8 History: Diabetes Clustered Wound: No Photos Photo Uploaded By: Alejandro Mulling on 06/23/2016 16:50:27 Wound Measurements Length: (cm) 1.5 Width: (cm) 3.3 Depth: (cm) 0.4 Area: (cm) 3.888 Volume: (cm) 1.555 % Reduction in Area: 56.2% % Reduction in Volume: 65% Epithelialization: None Tunneling: No Undermining: No Wound Description Classification:  Category/Stage IV Foul Odor Af Diabetic Severity [address]): Grade 1 Wound Margin: Thickened Exudate Amount: Large Exudate Type: Serosanguineous Exudate Color: red, brown ter Cleansing: No Wound Bed Granulation Amount: Large (67-100%) Exposed Structure Granulation Quality: Red Fascia Exposed: No Necrotic Amount: Small (1-33%) Fat Layer Exposed: No Stiver, Sonia L. ([id number]) Necrotic Quality: Adherent Slough Tendon Exposed: Yes Muscle Exposed: Yes Necrosis of Muscle: No Joint Exposed: No Bone Exposed: Yes Periwound Skin Texture Texture Color No Abnormalities Noted: No No Abnormalities Noted: No Localized Edema: Yes Erythema: Yes Erythema Location: Circumferential Moisture No Abnormalities Noted: No Temperature / Pain Maceration: Yes Temperature: No Abnormality Moist: Yes Tenderness on Palpation: Yes Wound Preparation Ulcer Cleansing: Rinsed/Irrigated with Saline Topical Anesthetic Applied: Other: lidocaine 4%, Treatment Notes Wound #2 (Right, Lateral Calcaneus) 1. Cleansed with: Cleanse wound with antibacterial soap and water 2. Anesthetic Topical Lidocaine 4% cream to wound bed prior to debridement 4. Dressing Applied: Other dressing (specify in notes) 5. Secondary Dressing Applied ABD Pad Dry Gauze 7. Secured with Tape Notes kerlix, coban, charcoal, drawtex, Apligraph Electronic Signature(s) Signed: 06/23/2016 5:18:57 PM By: Alejandro Mulling Entered By: Alejandro Mulling on 06/23/2016 13:08:33 Mullane, Jahbari L. ([id number]) -------------------------------------------------------------------------------- Vitals Details Patient Name: Keeble, Roshawn L. Date of Service: 06/23/2016 12:45 PM Medical Record Number: [id number] Patient Account Number: [id number] Date of Birth/Sex: 17-Jan-1946 (70 y.o. Male) Treating RN: Phillis Haggis Primary Care Physician: Montez Morita, MONICA Other Clinician: Referring Physician: Montez Morita, MONICA Treating Physician/Extender: Eugene Garnet in Treatment: 8 Vital Signs Time Taken: 13:15 Temperature (F): 97.6 Height (in): 65 Pulse (bpm): 70 Respiratory Rate (breaths/min): 18 Blood Pressure (mmHg): 117/49 Reference Range: 80 - 120 mg / dl Electronic Signature(s) Signed: 06/23/2016 5:18:57 PM By: Alejandro Mulling Entered By: Alejandro Mulling on 06/23/2016 13:36:43

## 2016-06-25 NOTE — Progress Notes (Signed)
[name], [name] ([id number]) Visit Report for 06/23/2016 Chief Complaint Document Details Patient Name: [name], [name] Date of Service: 06/23/2016 12:45 PM Medical Record Number: [id number] Patient Account Number: [id number] Date of Birth/Sex: August 29, 1946 (70 y.o. Male) Treating RN: Phillis Haggis Primary Care Physician: Montez Morita, MONICA Other Clinician: Referring Physician: Montez Morita, MONICA Treating Physician/Extender: Eugene Garnet in Treatment: 8 Information Obtained from: Patient Chief Complaint Patient is here for our review of bilateral heel ulcers for the last 5-6 months Electronic Signature(s) Signed: 06/24/2016 5:22:48 PM By: Georges Lynch FNP Entered By: Georges Lynch on 06/24/2016 15:43:04 [name], [name] ([id number]) -------------------------------------------------------------------------------- Cellular or Tissue Based Product Details Patient Name: [name], [name] Date of Service: 06/23/2016 12:45 PM Medical Record Number: [id number] Patient Account Number: [id number] Date of Birth/Sex: 1946-11-08 (70 y.o. Male) Treating RN: Phillis Haggis Primary Care Physician: Montez Morita, MONICA Other Clinician: Referring Physician: Montez Morita, MONICA Treating Physician/Extender: Eugene Garnet in Treatment: 8 Cellular or Tissue Based Wound #2 Right,Lateral Calcaneus Product Type Applied to: Performed By: Physician Georges Lynch, NP Cellular or Tissue Based Apligraf Product Type: Time-Out Taken: Yes Location: genitalia / hands / feet / multiple digits Wound Size (sq cm): 4.95 Product Size (sq cm): 44 Waste Size (sq cm): 39 Waste Reason: wound size Amount of Product Applied (sq cm): 5 Lot #: [id number] Expiration Date: 06/30/2016 Fenestrated: Yes Instrument: Blade Reconstituted: Yes Solution Type: saline Solution Amount: 3ml Lot #: [id number] Solution Expiration 02/10/2018 Date: Secured: Yes Secured With: Steri-Strips Dressing Applied: Yes Primary  Dressing: mepitel one Procedural Pain: 0 Post Procedural Pain: 0 Response to Treatment: Procedure was tolerated well Post Procedure Diagnosis Same as Pre-procedure Electronic Signature(s) Signed: 06/24/2016 5:22:48 PM By: Georges Lynch FNP Previous Signature: 06/23/2016 5:18:57 PM Version By: Alejandro Mulling Entered By: Georges Lynch on 06/24/2016 15:41:52 [name], [name] ([id number]) [name], [name] ([id number]) -------------------------------------------------------------------------------- Debridement Details Patient Name: [name], [name] Date of Service: 06/23/2016 12:45 PM Medical Record Number: [id number] Patient Account Number: [id number] Date of Birth/Sex: 03-Jan-1946 (70 y.o. Male) Treating RN: Phillis Haggis Primary Care Physician: Montez Morita, MONICA Other Clinician: Referring Physician: Montez Morita, MONICA Treating Physician/Extender: Eugene Garnet in Treatment: 8 Debridement Performed for Wound #1 Left Calcaneus Assessment: Performed By: Physician Georges Lynch, NP Debridement: Open Wound/Selective Debridement Selective Description: Pre-procedure Yes Verification/Time Out Taken: Start Time: 13:37 Pain Control: Other : lidocaine 4% Level: Non-Viable Tissue Total Area Debrided (L x 1.3 (cm) x 1 (cm) = 1.3 (cm) W): Tissue and other Viable, Non-Viable, Exudate, Fibrin/Slough, Subcutaneous material debrided: Instrument: Blade Bleeding: Minimum Hemostasis Achieved: Pressure End Time: 13:38 Procedural Pain: 0 Post Procedural Pain: 0 Post Debridement Measurements of Total Wound Length: (cm) 1.3 Stage: Category/Stage II Width: (cm) 1 Depth: (cm) 0.3 Volume: (cm) 0.306 Post Procedure Diagnosis Same as Pre-procedure Electronic Signature(s) Signed: 06/23/2016 5:18:57 PM By: Alejandro Mulling Signed: 06/24/2016 5:22:48 PM By: Georges Lynch FNP Entered By: Alejandro Mulling on 06/23/2016 13:38:07 [name], [name]  ([id number]) -------------------------------------------------------------------------------- Debridement Details Patient Name: [name], [name] Date of Service: 06/23/2016 12:45 PM Medical Record Number: [id number] Patient Account Number: [id number] Date of Birth/Sex: 1946-04-08 (70 y.o. Male) Treating RN: Phillis Haggis Primary Care Physician: Montez Morita, MONICA Other Clinician: Referring Physician: Montez Morita, MONICA Treating Physician/Extender: Eugene Garnet in Treatment: 8 Debridement Performed for Wound #2 Right,Lateral Calcaneus Assessment: Performed By: Physician Georges Lynch, NP Debridement: Open Wound/Selective Debridement Selective Description: Pre-procedure Yes Verification/Time Out Taken: Start Time: 13:38 Pain Control: Other : lidocaine 4% Level: Non-Viable Tissue Total Area Debrided (L x 1.5 (cm) x 3.3 (cm) =  4.95 (cm) W): Tissue and other Viable, Non-Viable, Exudate, Fibrin/Slough, Subcutaneous material debrided: Instrument: Blade Bleeding: Minimum Hemostasis Achieved: Pressure End Time: 13:39 Procedural Pain: 0 Post Procedural Pain: 0 Post Debridement Measurements of Total Wound Length: (cm) 1.5 Stage: Category/Stage IV Width: (cm) 3.3 Depth: (cm) 0.4 Volume: (cm) 1.555 Post Procedure Diagnosis Same as Pre-procedure Electronic Signature(s) Signed: 06/23/2016 5:18:57 PM By: Alejandro Mulling Signed: 06/24/2016 5:22:48 PM By: Georges Lynch FNP Entered By: Alejandro Mulling on 06/23/2016 13:38:50 [name], [name] ([id number]) -------------------------------------------------------------------------------- HPI Details Patient Name: [name], [name] Date of Service: 06/23/2016 12:45 PM Medical Record Number: [id number] Patient Account Number: [id number] Date of Birth/Sex: 1945/12/25 (70 y.o. Male) Treating RN: Phillis Haggis Primary Care Physician: Montez Morita, MONICA Other Clinician: Referring Physician: Montez Morita, MONICA Treating  Physician/Extender: Eugene Garnet in Treatment: 8 History of Present Illness HPI Description: 04/27/16; this is a 70 year old man who is a resident of [facility] in [place]. He is accompanied by a family friend today. She tells me that he has had problems with pressure ulcers since December 2016 largely at a time where he apparently had diabetic hypoglycemic episodes. Apparently these became so severe that he had to be transferred to [facility] for operative debridement apparently bilaterally. I don't have any records from [facility] but per his friend they did not do imaging studies bone cultures arterial studies etc. Apparently the area just applying wet to dry dressings to these. The patient is a diabetic on insulin. His arterial studies here were noncompressible although his pulses could be dopplered. 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 At one point I note in his notes he was on hospice I'm not really sure whether that is true currently or not. 05/04/16; x-ray of the right heel done in his skilled facility was negative for significant bone pathology. Arterial studies also done in the facility revealed noncompressible vessels therefore no ABI could be calculated. His arterial ultrasound showed low velocities scattered atherosclerotic disease negative for focal stenosis but monophasic waveforms bilaterally. The overall impression was "mild peripheral arterial disease" 05/11/16; culture last week of the right heel grew MRSA and group B strep. We started doxycycline today. 05/19/16; I started the patient on doxycycline 8 days ago. He has not tolerated this well secondary to gastric irritation. He is also complaining of increasing pain in the right heel. 05/26/16; the  patient is completing his Zyvox that I gave him last week. Culture grew heavy group B strep and a few Escherichia coli. Surprisingly did not culture any further MRSA. Apligraf #1 06/09/16 both heal wounds are here for review today. Apligraf #2 06/23/16: pt returns today for ongoing evaluation and management of bilateral calcaneal wounds. Apligraf #3 Electronic Signature(s) Signed: 06/24/2016 5:22:48 PM By: Georges Lynch FNP Entered By: Georges Lynch on 06/24/2016 15:44:39 [name], [name] ([id number]) -------------------------------------------------------------------------------- Physical Exam Details Patient Name: Rosa, Barth L. Date of Service: 06/23/2016 12:45 PM Medical Record Number: [id number] Patient Account Number: [id number] Date of Birth/Sex: [birth year] (70 y.o. Male) Treating RN: Phillis Haggis Primary Care Physician: Montez Morita, MONICA Other Clinician: Referring Physician: Montez Morita, MONICA Treating Physician/Extender: Eugene Garnet in Treatment: 8 Integumentary (Hair, Skin) bilalateral calcaneal ulcers with slough, selective debridement performed. good healthy granulation tissue. no s/s of infection.Marland Kitchen Psychiatric Judgement and insight intact.. Alert and oriented times 3.. No evidence of depression, anxiety, or agitation. Calm, cooperative, and  communicative. Appropriate interactions and affect.. Electronic Signature(s) Signed: 06/24/2016 5:22:48 PM By: Georges Lynch FNP Entered By: Georges Lynch on 06/24/2016 15:46:03 [name], [name] ([id number]) -------------------------------------------------------------------------------- Physician Orders Details Patient Name: [name], [name] Date of Service: 06/23/2016 12:45 PM Medical Record Number: [id number] Patient Account Number: [id number] Date of Birth/Sex: [birth year] (70 y.o. Male) Treating RN: Phillis Haggis Primary Care Physician: Montez Morita, MONICA Other Clinician: Referring Physician: Montez Morita,  MONICA Treating Physician/Extender: Eugene Garnet in Treatment: 8 Verbal / Phone Orders: Yes Clinician: Ashok Cordia, Debi Read Back and Verified: Yes Diagnosis Coding Wound Cleansing Wound #1 Left Calcaneus o Clean wound with Normal Saline. - FOR CLINIC USE Wound #2 Right,Lateral Calcaneus o Clean wound with Normal Saline. - FOR CLINIC USE Anesthetic Wound #1 Left Calcaneus o Topical Lidocaine 4% cream applied to wound bed prior to debridement - for clinic use only Wound #2 Right,Lateral Calcaneus o Topical Lidocaine 4% cream applied to wound bed prior to debridement - for clinic use only Skin Barriers/Peri-Wound Care Wound #1 Left Calcaneus o Barrier cream - FOR CLINIC USE Wound #2 Right,Lateral Calcaneus o Barrier cream - FOR CLINIC USE Primary Wound Dressing Wound #1 Left Calcaneus o Other: - COLLAGEN DRESSING Wound #2 Right,Lateral Calcaneus o Other: - APLIGRAPG Secondary Dressing Wound #1 Left Calcaneus o Dry Gauze o XtraSorb - CHARCOAL, HEEL CUP, Mepitel, steri-strips Wound #2 Right,Lateral Calcaneus o Dry Gauze o XtraSorb - CHARCOAL, HEEL CUP, Mepitel, steri-strips [name], [name] ([id number]) Dressing Change Frequency Wound #1 Left Calcaneus o Change dressing every week - AT CLINIC Wound #2 Right,Lateral Calcaneus o Change dressing every week - AT CLINIC Follow-up Appointments Wound #1 Left Calcaneus o Return Appointment in 1 week. Wound #2 Right,Lateral Calcaneus o Return Appointment in 1 week. Edema Control Wound #1 Left Calcaneus o 2 Layer Lite Compression System - Bilateral - kerlix and coban Wound #2 Right,Lateral Calcaneus o 2 Layer Lite Compression System - Bilateral - kerlix and coban Off-Loading Wound #1 Left Calcaneus o Heel suspension boot to: - please float the heels when pt is sitting with feet up or when pt is lying in bed. o Turn and reposition every 2 hours Wound #2 Right,Lateral  Calcaneus o Heel suspension boot to: - please float the heels when pt is sitting with feet up or when pt is lying in bed. o Turn and reposition every 2 hours Additional Orders / Instructions Wound #1 Left Calcaneus o Increase protein intake. o Other: - Physical Therapy pt may walk with wheel chair behind patient during therapy daily. Walk with non-slip foot wear. Wound #2 Right,Lateral Calcaneus o Increase protein intake. o Activity as tolerated - Physical Therapy pt may walk with wheel chair behind patient during therapy daily. Walk with non-slip foot wear. o Other: - Continue with bedbath until further notice. Medications-please add to medication list. [name], [name] ([id number]) Wound #1 Left Calcaneus o Other: - Vitamin C, Zinc, Multivitamin Wound #2 Right,Lateral Calcaneus o Other: - Vitamin C, Zinc, Multivitamin Electronic Signature(s) Signed: 06/23/2016 5:18:57 PM By: Alejandro Mulling Signed: 06/24/2016 5:22:48 PM By: Georges Lynch FNP Entered By: Alejandro Mulling on 06/23/2016 14:06:10 Hogeland, Lorrin L. ([id number]) -------------------------------------------------------------------------------- Problem List Details Patient Name: Caterino, Talin L. Date of Service: 06/23/2016 12:45 PM Medical Record Number: [id number] Patient Account Number: [id number] Date of Birth/Sex: July 23, 1946 (70 y.o. 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 ([id number]) -------------------------------------------------------------------------------- Progress Note Details Patient Name: Aubuchon, Muzammil L. Date of Service: 06/23/2016 12:45 PM Medical Record Number: [id number] Patient Account Number: [id number] Date of Birth/Sex: [birth year] (70 y.o. Male) Treating RN: Phillis Haggis Primary Care Physician: Montez Morita, MONICA Other Clinician: Referring Physician: Montez Morita, MONICA Treating Physician/Extender: Eugene Garnet in Treatment: 8 Subjective Chief Complaint Information obtained from Patient Patient is here for our review of bilateral heel ulcers for the last 5-6 months History of Present Illness (HPI) 04/27/16; this is a 70 year old man who is a resident of [facility] in [place]. He is accompanied by a family friend today. She tells me that he has had problems with pressure ulcers since December 2016 largely at a time where he apparently had diabetic hypoglycemic episodes. Apparently these became so severe that he had to be transferred to [facility] for operative debridement apparently bilaterally. I don't have any records from [facility] but per his friend they did not do imaging studies bone cultures arterial studies etc. Apparently the area just applying wet to dry dressings to these. The patient is a diabetic on insulin. His arterial studies here were noncompressible although his pulses could be dopplered. 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 At one point I note in his notes he was on hospice I'm not really sure  whether that is true currently or not. 05/04/16; x-ray of the right heel done in his skilled facility was negative for significant bone pathology. Arterial studies also done in the facility revealed noncompressible vessels therefore no ABI could be calculated. His arterial ultrasound showed low velocities scattered atherosclerotic disease negative for focal stenosis but monophasic waveforms bilaterally. The overall impression was "mild peripheral arterial disease" 05/11/16; culture last week of the right heel grew MRSA and group B strep. We started doxycycline today. 05/19/16; I started the patient on doxycycline 8 days ago. He has not tolerated this well secondary to gastric irritation. He is also complaining of increasing pain in the right heel. 05/26/16; the patient is completing his Zyvox that I gave him last week. Culture grew heavy group B strep and a few Escherichia coli. Surprisingly did not culture any further MRSA. Apligraf #1 06/09/16 both heal wounds are here for review today. Apligraf #2 06/23/16: pt returns today for ongoing evaluation and management of bilateral calcaneal wounds. Apligraf #3 Objective Constitutional Vitals Time Taken: 1:15 PM, Height: 65 in, Temperature: 97.6 F, Pulse: 70 bpm, Respiratory Rate: 18 breaths/min, Blood Pressure: 117/49 mmHg. Harnack, Stockton L. ([id number]) Psychiatric Judgement and insight intact.. Alert and oriented times 3.. No evidence of depression, anxiety, or agitation. Calm, cooperative, and communicative. Appropriate interactions and affect.. Integumentary (Hair, Skin) bilalateral calcaneal ulcers with slough, selective debridement performed. good healthy granulation tissue. no s/s of infection.. Wound #1 status is Open. Original cause of wound was Pressure Injury. The wound is located on the Left Calcaneus. The wound measures 1.3cm length x 1cm width x 0.3cm depth; 1.021cm^2 area and  0.306cm^3 volume. The wound is limited to skin breakdown.  There is no tunneling or undermining noted. There is a large amount of serosanguineous drainage noted. The wound margin is thickened. There is large (67-100%) red granulation within the wound bed. There is a small (1-33%) amount of necrotic tissue within the wound bed including Eschar and Adherent Slough. The periwound skin appearance exhibited: Maceration, Moist. Periwound temperature was noted as No Abnormality. The periwound has tenderness on palpation. Wound #2 status is Open. Original cause of wound was Pressure Injury. The wound is located on the Right,Lateral Calcaneus. The wound measures 1.5cm length x 3.3cm width x 0.4cm depth; 3.888cm^2 area and 1.555cm^3 volume. There is bone, muscle, and tendon exposed. There is no tunneling or undermining noted. There is a large amount of serosanguineous drainage noted. The wound margin is thickened. There is large (67-100%) red granulation within the wound bed. There is a small (1-33%) amount of necrotic tissue within the wound bed including Adherent Slough. The periwound skin appearance exhibited: Localized Edema, Maceration, Moist, Erythema. The surrounding wound skin color is noted with erythema which is circumferential. Periwound temperature was noted as No Abnormality. The periwound has tenderness on palpation. Assessment Active Problems ICD-10 E11.621 - Type 2 diabetes mellitus with foot ulcer E11.42 - Type 2 diabetes mellitus with diabetic polyneuropathy L89.614 - Pressure ulcer of right heel, stage 4 L89.623 - Pressure ulcer of left heel, stage 3 I35.0 - Nonrheumatic aortic (valve) stenosis Arteaga, Slate L. ([id number]) Procedures Wound #1 Wound #1 is a Pressure Ulcer located on the Left Calcaneus . There was a Non-Viable Tissue Open Wound/Selective [phone] debridement with total area of 1.3 sq cm performed by Georges LynchSaunders, Sharon, NP. with the following instrument(s): Blade to remove Viable and Non-Viable tissue/material  including Exudate, Fibrin/Slough, and Subcutaneous after achieving pain control using Other (lidocaine 4%). A time out was conducted prior to the start of the procedure. A Minimum amount of bleeding was controlled with Pressure. The patient tolerated the procedure with a pain level of 0 throughout and a pain level of 0 following the procedure. Post Debridement Measurements: 1.3cm length x 1cm width x 0.3cm depth; 0.306cm^3 volume. Post debridement Stage noted as Category/Stage II. Post procedure Diagnosis Wound #1: Same as Pre-Procedure Wound #2 Wound #2 is a Pressure Ulcer located on the Right,Lateral Calcaneus . There was a Non-Viable Tissue Open Wound/Selective [phone] debridement with total area of 4.95 sq cm performed by Georges LynchSaunders, Sharon, NP. with the following instrument(s): Blade to remove Viable and Non-Viable tissue/material including Exudate, Fibrin/Slough, and Subcutaneous after achieving pain control using Other (lidocaine 4%). A time out was conducted prior to the start of the procedure. A Minimum amount of bleeding was controlled with Pressure. The patient tolerated the procedure with a pain level of 0 throughout and a pain level of 0 following the procedure. Post Debridement Measurements: 1.5cm length x 3.3cm width x 0.4cm depth; 1.555cm^3 volume. Post debridement Stage noted as Category/Stage IV. Post procedure Diagnosis Wound #2: Same as Pre-Procedure Wound #2 is a Pressure Ulcer located on the Right,Lateral Calcaneus. A skin graft procedure using a bioengineered skin substitute/cellular or tissue based product was performed by Georges LynchSaunders, Sharon, NP. Apligraf was applied and secured with Steri-Strips. 5 sq cm of product was utilized and 39 sq cm was wasted due to wound size. Post Application, mepitel one was applied. A Time Out was conducted prior to the start of the procedure. The procedure was tolerated well with a pain level of 0 throughout and a  pain level of 0  following the procedure. Post procedure Diagnosis Wound #2: Same as Pre-Procedure . Plan Wound Cleansing: Wound #1 Left Calcaneus: Clean wound with Normal Saline. - FOR CLINIC USE Wound #2 Right,Lateral Calcaneus: Clean wound with Normal Saline. - FOR CLINIC USE Anesthetic: Wound #1 Left Calcaneus: Topical Lidocaine 4% cream applied to wound bed prior to debridement - for clinic use only Muff, Chia L. ([id number]) Wound #2 Right,Lateral Calcaneus: Topical Lidocaine 4% cream applied to wound bed prior to debridement - for clinic use only Skin Barriers/Peri-Wound Care: Wound #1 Left Calcaneus: Barrier cream - FOR CLINIC USE Wound #2 Right,Lateral Calcaneus: Barrier cream - FOR CLINIC USE Primary Wound Dressing: Wound #1 Left Calcaneus: Other: - COLLAGEN DRESSING Wound #2 Right,Lateral Calcaneus: Other: - APLIGRAPG Secondary Dressing: Wound #1 Left Calcaneus: Dry Gauze XtraSorb - CHARCOAL, HEEL CUP, Mepitel, steri-strips Wound #2 Right,Lateral Calcaneus: Dry Gauze XtraSorb - CHARCOAL, HEEL CUP, Mepitel, steri-strips Dressing Change Frequency: Wound #1 Left Calcaneus: Change dressing every week - AT CLINIC Wound #2 Right,Lateral Calcaneus: Change dressing every week - AT CLINIC Follow-up Appointments: Wound #1 Left Calcaneus: Return Appointment in 1 week. Wound #2 Right,Lateral Calcaneus: Return Appointment in 1 week. Edema Control: Wound #1 Left Calcaneus: 2 Layer Lite Compression System - Bilateral - kerlix and coban Wound #2 Right,Lateral Calcaneus: 2 Layer Lite Compression System - Bilateral - kerlix and coban Off-Loading: Wound #1 Left Calcaneus: Heel suspension boot to: - please float the heels when pt is sitting with feet up or when pt is lying in bed. Turn and reposition every 2 hours Wound #2 Right,Lateral Calcaneus: Heel suspension boot to: - please float the heels when pt is sitting with feet up or when pt is lying in bed. Turn and reposition every 2  hours Additional Orders / Instructions: Wound #1 Left Calcaneus: Increase protein intake. Other: - Physical Therapy pt may walk with wheel chair behind patient during therapy daily. Walk with non- slip foot wear. Wound #2 Right,Lateral Calcaneus: Increase protein intake. Activity as tolerated - Physical Therapy pt may walk with wheel chair behind patient during therapy daily. Walk with non-slip foot wear. Other: - Continue with bedbath until further notice. Dykes, Keilen L. ([id number]) Medications-please add to medication list.: Wound #1 Left Calcaneus: Other: - Vitamin C, Zinc, Multivitamin Wound #2 Right,Lateral Calcaneus: Other: - Vitamin C, Zinc, Multivitamin Follow-Up Appointments: A follow-up appointment should be scheduled. Medication Reconciliation completed and provided to Patient/Care Provider. A Patient Clinical Summary of Care was provided to [facility] Electronic Signature(s) Signed: 06/24/2016 5:22:48 PM By: Georges Lynch FNP Entered By: Georges Lynch on 06/24/2016 15:47:20 Norlander, Perry L. ([id number]) -------------------------------------------------------------------------------- SuperBill Details Patient Name: Lockyer, Krishon L. Date of Service: 06/23/2016 Medical Record Number: [id number] Patient Account Number: [id number] Date of Birth/Sex: 1946/07/02 (70 y.o. 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, stage 4 L89.623 Pressure ulcer of left heel, stage 3 I35.0 Nonrheumatic aortic (valve) stenosis Facility Procedures CPT4 Code: 62952841 Description: (Facility Use Only) Apligraf 1 SQ CM ICD-10 Description Diagnosis  E11.621 Type 2 diabetes mellitus with foot ulcer 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, stage 4 L89.623 Pressure ulcer of left heel, stage 3 Modifier: Quantity: 44 CPT4 Code: 32440102 Description: 15275 - SKIN SUB GRAFT FACE/NK/HF/G ICD-10 Description Diagnosis  E11.621 Type 2 diabetes mellitus with foot ulcer 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 ([id number]) Modifier: Quantity: 1 Electronic Signature(s) Signed: 06/24/2016 5:22:48 PM By: Georges Lynch FNP Entered By: Georges Lynch on 06/24/2016 15:47:53

## 2016-06-30 ENCOUNTER — Non-Acute Institutional Stay (SKILLED_NURSING_FACILITY): Payer: Medicare Other | Admitting: Nurse Practitioner

## 2016-06-30 ENCOUNTER — Encounter: Payer: Self-pay | Admitting: Nurse Practitioner

## 2016-06-30 DIAGNOSIS — L89600 Pressure ulcer of unspecified heel, unstageable: Secondary | ICD-10-CM

## 2016-06-30 DIAGNOSIS — E034 Atrophy of thyroid (acquired): Secondary | ICD-10-CM

## 2016-06-30 DIAGNOSIS — E038 Other specified hypothyroidism: Secondary | ICD-10-CM | POA: Diagnosis not present

## 2016-06-30 DIAGNOSIS — D638 Anemia in other chronic diseases classified elsewhere: Secondary | ICD-10-CM | POA: Diagnosis not present

## 2016-06-30 DIAGNOSIS — K7682 Hepatic encephalopathy: Secondary | ICD-10-CM

## 2016-06-30 DIAGNOSIS — F29 Unspecified psychosis not due to a substance or known physiological condition: Secondary | ICD-10-CM

## 2016-06-30 DIAGNOSIS — E1140 Type 2 diabetes mellitus with diabetic neuropathy, unspecified: Secondary | ICD-10-CM

## 2016-06-30 DIAGNOSIS — IMO0002 Reserved for concepts with insufficient information to code with codable children: Secondary | ICD-10-CM

## 2016-06-30 DIAGNOSIS — E1165 Type 2 diabetes mellitus with hyperglycemia: Secondary | ICD-10-CM | POA: Diagnosis not present

## 2016-06-30 DIAGNOSIS — E11621 Type 2 diabetes mellitus with foot ulcer: Secondary | ICD-10-CM | POA: Diagnosis not present

## 2016-06-30 DIAGNOSIS — Z794 Long term (current) use of insulin: Secondary | ICD-10-CM

## 2016-06-30 DIAGNOSIS — K7290 Hepatic failure, unspecified without coma: Secondary | ICD-10-CM | POA: Diagnosis not present

## 2016-06-30 NOTE — Progress Notes (Signed)
Patient ID: [name], male   DOB: 23-Jan-1946, 70 y.o.   MRN: [id number]    Nursing Home Location:  [facility]   Place of Service: SNF (31)  PCP: Kirt Boysarter, Monica, DO  Allergies  Allergen Reactions  . Oxycodone Other (See Comments)    Sedation;change in mental status  . Penicillins Hives    Chief Complaint  Patient presents with  . Medical Management of Chronic Issues    Routine Visit    HPI:  Patient is a 70 y.o. male [facility]. Pt with a pmh of history of hypertension,diabetes, hypothyroidism, CKD, bilateral hip fractures s/p repair and depression. Pt no longer followed by hospice due to aggressive treatment to heel wounds. Pt does report pain in heels but pain regimen is effective. Pt with recent increase in behaviors. Psych following pt and has modified medications. Pt reports mood is currently improved.  Review of Systems:  Review of Systems  Constitutional: Positive for fatigue. Negative for activity change, appetite change and unexpected weight change.  HENT: Negative for congestion, hearing loss and sore throat.   Eyes: Negative.   Respiratory: Negative for cough and shortness of breath.   Cardiovascular: Negative for chest pain, palpitations and leg swelling.  Gastrointestinal: Negative for abdominal pain, diarrhea and constipation.  Genitourinary: Negative for urgency and difficulty urinating.  Musculoskeletal: Negative for myalgias and arthralgias.  Skin: Positive for wound. Negative for color change, pallor and rash.  Neurological: Positive for tremors and weakness. Negative for dizziness, light-headedness and headaches.  Psychiatric/Behavioral: Positive for behavioral problems, confusion, sleep disturbance and agitation. The patient is not nervous/anxious.     Past Medical History  Diagnosis Date  . Hyperglycemia   . Hyponatremia   . DMII (diabetes mellitus, type 2) (HCC)   . Depression   . IBS  (irritable bowel syndrome)   . Chest pain   . Unspecified gastritis and gastroduodenitis without mention of hemorrhage   . Arthropathy, unspecified, site unspecified   . History of colon polyps   . Anemia, unspecified   . Anxiety   . GERD (gastroesophageal reflux disease)   . Hyperlipidemia   . Hypertension   . Liver problem    Past Surgical History  Procedure Laterality Date  . Hernia repair  1964  . Intramedullary (im) nail intertrochanteric Left 01/07/2015    Procedure: INTRAMEDULLARY (IM) NAIL INTERTROCHANTRIC;  Surgeon: Kathryne Hitchhristopher Y Blackman, MD;  Location: [facility] ORS;  Service: Orthopedics;  Laterality: Left;  . Femur im nail Right 03/24/2015    Procedure: CLOSED REDUCTION INTERNAL FIXATION;  Surgeon: Kerrin ChampagneJames E Nitka, MD;  Location: [facility] OR;  Service: Orthopedics;  Laterality: Right;  . Esophagogastroduodenoscopy N/A 04/21/2015    Procedure: ESOPHAGOGASTRODUODENOSCOPY (EGD);  Surgeon: Willis ModenaWilliam Outlaw, MD;  [facility];  Service: Endoscopy;  Laterality: N/A;  . Givens capsule study N/A 04/21/2015    Procedure: GIVENS CAPSULE STUDY;  Surgeon: Willis ModenaWilliam Outlaw, MD;  [facility];  Service: Endoscopy;  Laterality: N/A;  . Fracture surgery     Social History:   reports that he has quit smoking. His smoking use included Cigarettes. He smoked 1.00 pack per day. He does not have any smokeless tobacco history on file. He reports that he does not drink alcohol or use illicit drugs.  Family History  Problem Relation Age of Onset  . Stroke Mother   . Heart attack Father   . Heart disease Father   . Cancer Other  FH of Breast Cancer-other Relative  . Heart disease Other     Parent  . Hypertension Other     parent  . Cancer Maternal Uncle     colon    Medications: Patient's Medications  New Prescriptions   No medications on file  Previous Medications   AMBULATORY NON FORMULARY MEDICATION    Magic cup by mouth daily.   AMBULATORY NON FORMULARY MEDICATION    Give 120 cc  of NSA MedPass three time daily.   AMINO ACIDS-PROTEIN HYDROLYS (FEEDING SUPPLEMENT, PRO-STAT SUGAR FREE 64,) LIQD    Take 30 mLs by mouth daily.   FUROSEMIDE (LASIX) 20 MG TABLET    Take 20 mg by mouth.   HYDROMORPHONE (DILAUDID) 2 MG TABLET    Take 1 mg by mouth every 6 (six) hours as needed for severe pain.   HYDROXYZINE (ATARAX/VISTARIL) 25 MG TABLET    Take 25 mg by mouth every 8 (eight) hours as needed for itching.    INSULIN ASPART (NOVOLOG) 100 UNIT/ML INJECTION    Inject 5 Units into the skin 3 (three) times daily before meals. Use sliding scale based on CBG.   INSULIN DETEMIR (LEVEMIR FLEXPEN) 100 UNIT/ML PEN    Inject 14 Units into the skin at bedtime.    LACTULOSE (CHRONULAC) 10 GM/15ML SOLUTION    Take 40 g by mouth every 6 (six) hours.   LAMOTRIGINE (LAMICTAL) 100 MG TABLET    Take 100 mg by mouth every morning.   LAMOTRIGINE (LAMICTAL) 25 MG TABLET    Take 50 mg by mouth at bedtime.   LEVOTHYROXINE (SYNTHROID, LEVOTHROID) 25 MCG TABLET    Take 25 mcg by mouth daily.   PHENYLEPHRINE-SHARK LIVER OIL-MINERAL OIL-PETROLATUM (PREPARATION H) 0.25-3-14-71.9 % RECTAL OINTMENT    Place 1 application rectally 2 (two) times daily as needed for hemorrhoids.   QUETIAPINE (SEROQUEL) 50 MG TABLET    Take 50 mg by mouth at bedtime.   RIFAXIMIN (XIFAXAN) 550 MG TABS TABLET    Take 550 mg by mouth 2 (two) times daily.   SHARK LIVER OIL-COCOA BUTTER (PREPARATION H) 0.25-3-85.5 % SUPPOSITORY    Place 1 suppository rectally every 6 (six) hours as needed for hemorrhoids.   SPIRONOLACTONE (ALDACTONE) 50 MG TABLET    Take 50 mg by mouth 2 (two) times daily.   TAMSULOSIN (FLOMAX) 0.4 MG CAPS CAPSULE    Take 0.4 mg by mouth daily. Must be given 30 minutes after same meal daily.  Modified Medications   No medications on file  Discontinued Medications   LAMOTRIGINE (LAMICTAL) 100 MG TABLET    Take 100 mg by mouth daily.     Physical Exam: Filed Vitals:   06/30/16 1010  BP: 110/52  Pulse: 68  Temp: 99  F (37.2 C)  TempSrc: Oral  Resp: 18  Height: 5\' 6"  (1.676 m)  Weight: 145 lb 3.2 oz (65.862 kg)    Physical Exam  Constitutional: He appears well-developed. No distress.  HENT:  Head: Normocephalic and atraumatic. Head is without contusion and without laceration.  Mouth/Throat: No oropharyngeal exudate.  Eyes: Conjunctivae and EOM are normal. Pupils are equal, round, and reactive to light.  Neck: Normal range of motion. Neck supple.  Cardiovascular: Normal rate, regular rhythm and normal heart sounds.   Pulmonary/Chest: Effort normal and breath sounds normal.  Abdominal: Soft. Bowel sounds are normal. He exhibits no distension. There is no tenderness.  Musculoskeletal: He exhibits no edema or tenderness.  Neurological: He is alert. He has  normal strength.  Skin: Skin is warm and dry. He is not diaphoretic.  unstageable pressure ulcer to left heel and right great toe.   Psychiatric: He has a normal mood and affect.    Labs reviewed: Basic Metabolic Panel:  Recent Labs  16/10/96 04/14/16 05/26/16  NA 131* 138 138  K 4.8 4.6 3.5  BUN 25* 34* 22*  CREATININE 1.2 1.3 0.8   Liver Function Tests:  Recent Labs  04/14/16  AST 24  ALT 17  ALKPHOS 103   No results for input(s): LIPASE, AMYLASE in the last 8760 hours. No results for input(s): AMMONIA in the last 8760 hours. CBC:  Recent Labs  04/14/16 05/26/16  WBC 2.4 1.7  HGB 10.0* 9.3*  HCT 29* 28*  PLT 59* 84*   TSH: No results for input(s): TSH in the last 8760 hours. A1C: Lab Results  Component Value Date   HGBA1C 8.9 05/26/2016   Lipid Panel: No results for input(s): CHOL, HDL, LDLCALC, TRIG, CHOLHDL, LDLDIRECT in the last 8760 hours.   Assessment/Plan 1. Encephalopathy, hepatic (HCC) -stable, overall pts mental status at baseline. Will follow up ammonia level at this time. conts on lactulose   2. Unstageable pressure ulcer of heel, unspecified laterality (HCC) Being followed at wound care center.  conts with boots   3. Psychosis, unspecified psychosis type Mood has been up and down. Pt recently seen by psych with adjustments in medications. Will cont current regimen and monitor at this time.   4. Uncontrolled type 2 diabetes mellitus with diabetic neuropathy, with long-term current use of insulin (HCC) Blood sugars elevated throughout the day. A1c not at goal at 8.9, will increase levemir to 18 units qhs, conts SSI  5. Hypothyroidism due to acquired atrophy of thyroid Will get TSH, currently on synthroid 25 mcg  6. Anemia of chronic disease hgb slightly worse on last lab, will follow up CBC   Jessica K. Biagio Borg  [facility] [phone] 8 am - 5 pm) [phone] (after hours)

## 2016-07-01 LAB — TSH: TSH: 2.63 u[IU]/mL (ref 0.41–5.90)

## 2016-07-01 LAB — CBC AND DIFFERENTIAL
HCT: 29 % — AB (ref 41–53)
Hemoglobin: 9 g/dL — AB (ref 13.5–17.5)
Platelets: 55 10*3/uL — AB (ref 150–399)
WBC: 1.5 10^3/mL

## 2016-07-01 NOTE — Progress Notes (Signed)
TYGE, SOMERS ([id number]) Visit Report for 06/30/2016 Arrival Information Details Patient Name: [name], [name] Date of Service: 06/30/2016 2:15 PM Medical Record Patient Account Number: [id number] Number: Treating RN: Phillis Haggis Jul 26, 1946 (70 y.o. Other Clinician: Date of Birth/Sex: Male) Treating ROBSON, Revere Primary Care Physician: Montez Morita, MONICA Physician/Extender: G Referring Physician: Montez Morita MONICA Weeks in Treatment: 9 Visit Information History Since Last Visit All ordered tests and consults were completed: No Patient Arrived: Wheel Chair Added or deleted any medications: No Arrival Time: 15:52 Any new allergies or adverse reactions: No Accompanied By: self Had a fall or experienced change in No Transfer Assistance: EasyPivot Patient activities of daily living that may affect Lift risk of falls: Patient Identification Verified: Yes Signs or symptoms of abuse/neglect since last No Secondary Verification Process Yes visito Completed: Hospitalized since last visit: No Patient Requires Transmission- No Pain Present Now: No Based Precautions: Patient Has Alerts: Yes Patient Alerts: DM II bil legs non- compressible Electronic Signature(s) Signed: 06/30/2016 5:02:09 PM By: Alejandro Mulling Entered By: Alejandro Mulling on 06/30/2016 15:53:04 Beirne, Jayvin L. ([id number]) -------------------------------------------------------------------------------- Clinic Level of Care Assessment Details Patient Name: [name], [name] Date of Service: 06/30/2016 2:15 PM Medical Record Patient Account Number: [id number] Number: Treating RN: Phillis Haggis 08/01/1946 (70 y.o. Other Clinician: Date of Birth/Sex: Male) Treating ROBSON, Kelii Primary Care Physician: Montez Morita, MONICA Physician/Extender: G Referring Physician: Montez Morita MONICA Weeks in Treatment: 9 Clinic Level of Care Assessment Items TOOL 4 Quantity Score X - Use when only an  EandM is performed on FOLLOW-UP visit 1 0 ASSESSMENTS - Nursing Assessment / Reassessment  - Reassessment of Co-morbidities (includes updates in patient status) 0  - Reassessment of Adherence to Treatment Plan 0 ASSESSMENTS - Wound and Skin Assessment / Reassessment  - Simple Wound Assessment / Reassessment - one wound 0 X - Complex Wound Assessment / Reassessment - multiple wounds 2 5  - Dermatologic / Skin Assessment (not related to wound area) 0 ASSESSMENTS - Focused Assessment  - Circumferential Edema Measurements - multi extremities 0  - Nutritional Assessment / Counseling / Intervention 0  - Lower Extremity Assessment (monofilament, tuning fork, pulses) 0  - Peripheral Arterial Disease Assessment (using hand held doppler) 0 ASSESSMENTS - Ostomy and/or Continence Assessment and Care  - Incontinence Assessment and Management 0  - Ostomy Care Assessment and Management (repouching, etc.) 0 PROCESS - Coordination of Care  - Simple Patient / Family Education for ongoing care 0 X - Complex (extensive) Patient / Family Education for ongoing care 1 20 X - Staff obtains Chiropractor, Records, Test Results / Process Orders 1 10 X - Staff telephones HHA, Nursing Homes / Clarify orders / etc 1 10 Hegstrom, Render L. ([id number])  - Routine Transfer to another Facility (non-emergent condition) 0  - Routine Hospital Admission (non-emergent condition) 0  - New Admissions / Manufacturing engineer / Ordering NPWT, Apligraf, etc. 0  - Emergency Hospital Admission (emergent condition) 0 X - Simple Discharge Coordination 1 10  - Complex (extensive) Discharge Coordination 0 PROCESS - Special Needs  - Pediatric / Minor Patient Management 0  - Isolation Patient Management 0  - Hearing / Language / Visual special needs 0  - Assessment of Community assistance (transportation, D/C planning, etc.) 0  - Additional assistance / Altered mentation 0  - Support  Surface(s) Assessment (bed, cushion, seat, etc.) 0 INTERVENTIONS - Wound Cleansing / Measurement  - Simple Wound Cleansing - one wound 0  - Complex Wound Cleansing - multiple wounds 0  -  Wound Imaging (photographs - any number of wounds) 0 []  - Wound Tracing (instead of photographs) 0 []  - Simple Wound Measurement - one wound 0 []  - Complex Wound Measurement - multiple wounds 0 INTERVENTIONS - Wound Dressings []  - Small Wound Dressing one or multiple wounds 0 []  - Medium Wound Dressing one or multiple wounds 0 X - Large Wound Dressing one or multiple wounds 2 20 []  - Application of Medications - topical 0 []  - Application of Medications - injection 0 Dorame, Stefanos L. ([id number]) INTERVENTIONS - Miscellaneous []  - External ear exam 0 []  - Specimen Collection (cultures, biopsies, blood, body fluids, etc.) 0 []  - Specimen(s) / Culture(s) sent or taken to Lab for analysis 0 []  - Patient Transfer (multiple staff / Michiel SitesHoyer Lift / Similar devices) 0 []  - Simple Staple / Suture removal (25 or less) 0 []  - Complex Staple / Suture removal (26 or more) 0 []  - Hypo / Hyperglycemic Management (close monitor of Blood Glucose) 0 []  - Ankle / Brachial Index (ABI) - do not check if billed separately 0 X - Vital Signs 1 5 Has the patient been seen at the hospital within the last three years: Yes Total Score: 105 Level Of Care: New/Established - Level 3 Electronic Signature(s) Signed: 06/30/2016 5:02:09 PM By: Alejandro MullingPinkerton, Debra Entered By: Alejandro MullingPinkerton, Debra on 06/30/2016 15:56:42 Hedstrom, Kyrus L. ([id number]) -------------------------------------------------------------------------------- Encounter Discharge Information Details Patient Name: [name], [name] Date of Service: 06/30/2016 2:15 PM Medical Record Patient Account Number: [id number] Number: Treating RN: Phillis Haggisinkerton, Debi 02-16-1946 (70 y.o. Other Clinician: Date of Birth/Sex: Male) Treating ROBSON, Erice Primary Care  Physician: Montez MoritaARTER, MONICA Physician/Extender: G Referring Physician: Kirt BoysARTER, MONICA Weeks in Treatment: 9 Encounter Discharge Information Items Discharge Pain Level: 0 Discharge Condition: Stable Ambulatory Status: Wheelchair Nursing Discharge Destination: Home Transportation: Other Accompanied By: self Schedule Follow-up Appointment: Yes Medication Reconciliation completed Yes and provided to Patient/Care Arienne Gartin: Clinical Summary of Care: Electronic Signature(s) Signed: 06/30/2016 5:02:09 PM By: Alejandro MullingPinkerton, Debra Entered By: Alejandro MullingPinkerton, Debra on 06/30/2016 15:56:01 Osterberg, [name] ([id number]) -------------------------------------------------------------------------------- Patient/Caregiver Education Details Patient Name: [name], [name] Date of Service: 06/30/2016 2:15 PM Medical Record Patient Account Number: [id number] Number: Treating RN: Phillis Haggisinkerton, Debi 02-16-1946 (70 y.o. Other Clinician: Date of Birth/Gender: Male) Treating ROBSON, Mccormick Primary Care Physician: Montez MoritaARTER, MONICA Physician/Extender: G Referring Physician: Zenda AlpersARTER, MONICA Weeks in Treatment: 9 Education Assessment Education Provided To: Patient Education Topics Provided Wound/Skin Impairment: Handouts: Other: do not get wraps wet Methods: Demonstration, Explain/Verbal Responses: State content correctly Electronic Signature(s) Signed: 06/30/2016 5:02:09 PM By: Alejandro MullingPinkerton, Debra Entered By: Alejandro MullingPinkerton, Debra on 06/30/2016 15:55:43 Mayor, Jovanni L. ([id number]) -------------------------------------------------------------------------------- Wound Assessment Details Patient Name: Degeorge, Acy L. Date of Service: 06/30/2016 2:15 PM Medical Record Patient Account Number: [id number] Number: Treating RN: Phillis Haggisinkerton, Debi 02-16-1946 (70 y.o. Other Clinician: Date of Birth/Sex: Male) Treating ROBSON, Francois Primary Care Physician: Montez MoritaARTER, MONICA Physician/Extender: G Referring  Physician: Montez MoritaARTER, MONICA Weeks in Treatment: 9 Wound Status Wound Number: 1 Primary Pressure Ulcer Etiology: Wound Location: Left Calcaneus Secondary Diabetic Wound/Ulcer of the Wounding Event: Pressure Injury Etiology: Lower Extremity Date Acquired: 12/11/2015 Wound Status: Open Weeks Of Treatment: 9 Comorbid Anemia, Hypertension, Type II Clustered Wound: No History: Diabetes Wound Measurements Length: (cm) 1.3 Width: (cm) 1 Depth: (cm) 0.3 Area: (cm) 1.021 Volume: (cm) 0.306 % Reduction in Area: 82.9% % Reduction in Volume: 74.3% Epithelialization: None Tunneling: No Undermining: No Wound Description Classification: Category/Stage II Diabetic Severity [address](Wagner): Grade 1 Wound Margin: Thickened Exudate Amount: Large Exudate  Type: Serosanguineous Exudate Color: red, brown Foul Odor After Cleansing: No Wound Bed Granulation Amount: Large (67-100%) Exposed Structure Granulation Quality: Red Fascia Exposed: No Necrotic Amount: Small (1-33%) Fat Layer Exposed: No Necrotic Quality: Eschar, Adherent Slough Tendon Exposed: No Muscle Exposed: No Joint Exposed: No Bone Exposed: No Limited to Skin Breakdown Periwound Skin Texture Texture Color No Abnormalities Noted: No No Abnormalities Noted: No Leason, Zandon L. ([id number]) Moisture Temperature / Pain No Abnormalities Noted: No Temperature: No Abnormality Maceration: Yes Tenderness on Palpation: Yes Moist: Yes Wound Preparation Ulcer Cleansing: Rinsed/Irrigated with Saline Topical Anesthetic Applied: Other: lidocaine 4%, Assessment Notes Unable to measure today was a nurse visit to have skin graft check and dressing change. Treatment Notes Wound #1 (Left Calcaneus) 1. Cleansed with: Cleanse wound with antibacterial soap and water 5. Secondary Dressing Applied ABD Pad Dry Gauze 7. Secured with Tape Notes xtrasorb, charcoal, kerlix, coban Electronic Signature(s) Signed: 06/30/2016 5:02:09 PM By:  Alejandro Mulling Entered By: Alejandro Mulling on 06/30/2016 15:54:06 Hanser, Virat L. ([id number]) -------------------------------------------------------------------------------- Wound Assessment Details Patient Name: Passey, [name] Date of Service: 06/30/2016 2:15 PM Medical Record Patient Account Number: [id number] Number: Treating RN: Phillis Haggis 1946/05/05 (70 y.o. Other Clinician: Date of Birth/Sex: Male) Treating ROBSON, Kevonta Primary Care Physician: Montez Morita, MONICA Physician/Extender: G Referring Physician: Montez Morita, MONICA Weeks in Treatment: 9 Wound Status Wound Number: 2 Primary Pressure Ulcer Etiology: Wound Location: Right Calcaneus - Lateral Secondary Diabetic Wound/Ulcer of the Wounding Event: Pressure Injury Etiology: Lower Extremity Date Acquired: 12/11/2015 Wound Status: Open Weeks Of Treatment: 9 Comorbid Anemia, Hypertension, Type II Clustered Wound: No History: Diabetes Wound Measurements Length: (cm) 1.5 Width: (cm) 3.3 Depth: (cm) 0.4 Area: (cm) 3.888 Volume: (cm) 1.555 % Reduction in Area: 56.2% % Reduction in Volume: 65% Epithelialization: None Tunneling: No Undermining: No Wound Description Classification: Category/Stage IV Foul Odor A Diabetic Severity (Wagner): Grade 1 Wound Margin: Thickened Exudate Amount: Large Exudate Type: Serosanguineous Exudate Color: red, brown fter Cleansing: No Wound Bed Granulation Amount: Large (67-100%) Exposed Structure Granulation Quality: Red Fascia Exposed: No Necrotic Amount: Small (1-33%) Fat Layer Exposed: No Necrotic Quality: Adherent Slough Tendon Exposed: Yes Muscle Exposed: Yes Necrosis of Muscle: No Joint Exposed: No Bone Exposed: Yes Periwound Skin Texture Texture Color No Abnormalities Noted: No No Abnormalities Noted: No Kadar, Nihaal L. ([id number]) Localized Edema: Yes Erythema: Yes Erythema Location: Circumferential Moisture No Abnormalities Noted: No  Temperature / Pain Maceration: Yes Temperature: No Abnormality Moist: Yes Tenderness on Palpation: Yes Wound Preparation Ulcer Cleansing: Rinsed/Irrigated with Saline Topical Anesthetic Applied: Other: lidocaine 4%, Assessment Notes Unable to measure today was a nurse visit to have skin graft check and dressing change. Treatment Notes Wound #2 (Right, Lateral Calcaneus) 1. Cleansed with: Cleanse wound with antibacterial soap and water 5. Secondary Dressing Applied ABD Pad Dry Gauze 7. Secured with Tape Notes xtrasorb, charcoal, kerlix, coban Electronic Signature(s) Signed: 06/30/2016 5:02:09 PM By: Alejandro Mulling Entered By: Alejandro Mulling on 06/30/2016 15:54:30

## 2016-07-02 LAB — CBC AND DIFFERENTIAL
HEMATOCRIT: 29 % — AB (ref 41–53)
HEMOGLOBIN: 9 g/dL — AB (ref 13.5–17.5)
PLATELETS: 55 10*3/uL — AB (ref 150–399)
WBC: 1.5 10^3/mL

## 2016-07-02 LAB — TSH: TSH: 2.63 u[IU]/mL (ref 0.41–5.90)

## 2016-07-07 ENCOUNTER — Encounter: Payer: PRIVATE HEALTH INSURANCE | Admitting: Internal Medicine

## 2016-07-07 DIAGNOSIS — E11621 Type 2 diabetes mellitus with foot ulcer: Secondary | ICD-10-CM | POA: Diagnosis not present

## 2016-07-08 NOTE — Progress Notes (Signed)
[name] ([id number]) Visit Report for 07/07/2016 Chief Complaint Document Details Patient Name: [name], [name] Date of Service: 07/07/2016 2:15 PM Medical Record Patient Account Number: [id number] Number: Treating RN: Phillis Haggis Feb 26, 1946 (70 y.o. Other Clinician: Date of Birth/Sex: Male) Treating ROBSON, Talib Primary Care Physician: Montez Morita, MONICA Physician/Extender: G Referring Physician: Kirt Boys Weeks in Treatment: 10 Information Obtained from: Patient Chief Complaint Patient is here for our review of bilateral heel ulcers for the last 5-6 months Electronic Signature(s) Signed: 07/07/2016 3:57:44 PM By: Baltazar Najjar MD Entered By: Baltazar Najjar on 07/07/2016 15:32:30 Keefe, Marolyn Hammock ([id number]) -------------------------------------------------------------------------------- Cellular or Tissue Based Product Details Patient Name: [name], [name] Date of Service: 07/07/2016 2:15 PM Medical Record Patient Account Number: [id number] Number: Treating RN: Phillis Haggis 26-Sep-1946 (70 y.o. Other Clinician: Date of Birth/Sex: Male) Treating ROBSON, Jontrell Primary Care Physician: Montez Morita, MONICA Physician/Extender: G Referring Physician: Montez Morita MONICA Weeks in Treatment: 10 Cellular or Tissue Based Wound #2 Right,Lateral Calcaneus Product Type Applied to: Performed By: Physician Maxwell Caul, MD Cellular or Tissue Based Apligraf Product Type: Time-Out Taken: Yes Location: genitalia / hands / feet / multiple digits Wound Size (sq cm): 5.25 Product Size (sq cm): 44 Waste Size (sq cm): 38.75 Waste Reason: WOUND SIZE Amount of Product Applied (sq cm): 5.25 Lot #: [id number] Expiration Date: 07/14/2016 Fenestrated: Yes Instrument: Blade Reconstituted: Yes Solution Type: NORMAL SALINE Solution Amount: Lot #: [id number] Solution Expiration 02/10/2018 Date: Secured: Yes Secured With: Steri-Strips Dressing Applied:  Yes Primary Dressing: MEPITEL Procedural Pain: 0 Post Procedural Pain: 0 Response to Treatment: Procedure was tolerated well Post Procedure Diagnosis Same as Pre-procedure Electronic Signature(s) Signed: 07/07/2016 3:57:44 PM By: Baltazar Najjar MD BRITTNEY, REMUND ([id number]) Entered By: Baltazar Najjar on 07/07/2016 15:31:44 Romey, Samul L. ([id number]) -------------------------------------------------------------------------------- HPI Details Patient Name: [name], [name] Date of Service: 07/07/2016 2:15 PM Medical Record Patient Account Number: [id number] Number: Treating RN: Phillis Haggis 1946-01-16 (70 y.o. Other Clinician: Date of Birth/Sex: Male) Treating ROBSON, Vergil Primary Care Physician: Montez Morita, MONICA Physician/Extender: G Referring Physician: Kirt Boys Weeks in Treatment: 10 History of Present Illness HPI Description: 04/27/16; this is a 70 year old man who is a resident of [facility] in [place]. He is accompanied by a family friend today. She tells me that he has had problems with pressure ulcers since December 2016 largely at a time where he apparently had diabetic hypoglycemic episodes. Apparently these became so severe that he had to be transferred to [facility] for operative debridement apparently bilaterally. I don't have any records from [facility] but per his friend they did not do imaging studies bone cultures arterial studies etc. Apparently the area just applying wet to dry dressings to these. The patient is a diabetic on insulin. His arterial studies here were noncompressible although his pulses could be dopplered. 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 At one point I note in his notes he was on hospice I'm not  really sure whether that is true currently or not. 05/04/16; x-ray of the right heel done in his skilled facility was negative for significant bone pathology. Arterial studies also done in the facility revealed noncompressible vessels therefore no ABI could be calculated. His arterial ultrasound showed low velocities scattered atherosclerotic disease negative for focal stenosis but monophasic waveforms bilaterally. The overall impression was "mild peripheral  arterial disease" 05/11/16; culture last week of the right heel grew MRSA and group B strep. We started doxycycline today. 05/19/16; I started the patient on doxycycline 8 days ago. He has not tolerated this well secondary to gastric irritation. He is also complaining of increasing pain in the right heel. 05/26/16; the patient is completing his Zyvox that I gave him last week. Culture grew heavy group B strep and a few Escherichia coli. Surprisingly did not culture any further MRSA. Apligraf #1 06/09/16 both heal wounds are here for review today. Apligraf #2 06/23/16: pt returns today for ongoing evaluation and management of bilateral calcaneal wounds. Apligraf #3 07/07/16; patient returns today for bilateral calcaneal wounds area left heel is considerably better right is not infected stagnant. Apligraf #4 Electronic Signature(s) Signed: 07/07/2016 3:57:44 PM By: Baltazar Najjar MD Entered By: Baltazar Najjar on 07/07/2016 15:33:21 Beigel, Marolyn Hammock ([id number]) -------------------------------------------------------------------------------- Physical Exam Details Patient Name: [name], [name] Date of Service: 07/07/2016 2:15 PM Medical Record Patient Account Number: [id number] Number: Treating RN: Phillis Haggis 04-08-1946 (70 y.o. Other Clinician: Date of Birth/Sex: Male) Treating ROBSON, Harman Primary Care Physician: Montez Morita, MONICA Physician/Extender: G Referring Physician: Montez Morita, MONICA Weeks in Treatment:  10 Constitutional Sitting or standing Blood Pressure is within target range for patient.. Pulse regular and within target range for patient.. Temperature is normal and within the target range for the patient.. . Patient's appearance is neat and clean. Appears in no acute distress. Well nourished and well developed.Marland Kitchen Respiratory Above normal respiratory effort noted. Respiratory rate elveaated. . Cardiovascular . Pedal pulses palpable and strong bilaterally.. Notes Wound exam; we're following the patient for wound on the bilateral Achilles area of her calcaneous. The left heel wound is smaller with healthy appearing wound bed but with still some depth. The right heels wound looks relatively unchanged which is disappointing. Granulation still abuts against the calcaneous itself Electronic Signature(s) Signed: 07/07/2016 3:57:44 PM By: Baltazar Najjar MD Entered By: Baltazar Najjar on 07/07/2016 15:39:11 Yager, Marolyn Hammock ([id number]) -------------------------------------------------------------------------------- Physician Orders Details Patient Name: Bovee, Sufyaan L. Date of Service: 07/07/2016 2:15 PM Medical Record Patient Account Number: [id number] Number: Treating RN: Phillis Haggis 23-Nov-1946 (70 y.o. Other Clinician: Date of Birth/Sex: Male) Treating ROBSON, Vishaal Primary Care Physician: Montez Morita, MONICA Physician/Extender: G Referring Physician: Kirt Boys Weeks in Treatment: 10 Verbal / Phone Orders: Yes Clinician: Ashok Cordia, Debi Read Back and Verified: Yes Diagnosis Coding Wound Cleansing Wound #1 Left Calcaneus o Clean wound with Normal Saline. - FOR CLINIC USE Wound #2 Right,Lateral Calcaneus o Clean wound with Normal Saline. - FOR CLINIC USE Anesthetic Wound #1 Left Calcaneus o Topical Lidocaine 4% cream applied to wound bed prior to debridement - for clinic use only Wound #2 Right,Lateral Calcaneus o Topical Lidocaine 4% cream applied to  wound bed prior to debridement - for clinic use only Skin Barriers/Peri-Wound Care Wound #1 Left Calcaneus o Barrier cream - FOR CLINIC USE Wound #2 Right,Lateral Calcaneus o Barrier cream - FOR CLINIC USE Primary Wound Dressing Wound #1 Left Calcaneus o Other: - COLLAGEN DRESSING Wound #2 Right,Lateral Calcaneus o Other: - APLIGRAPG Secondary Dressing Wound #1 Left Calcaneus o Dry Gauze o XtraSorb - CHARCOAL, HEEL CUP, Mepitel, steri-strips Wound #2 Right,Lateral Calcaneus Shimada, Rein L. ([id number]) o Dry Gauze o XtraSorb - CHARCOAL, HEEL CUP, Mepitel, steri-strips Dressing Change Frequency Wound #1 Left Calcaneus o Change dressing every week - AT CLINIC ****MAY CHANGE ONLY THE KERLIX AND COBAN WRAP****NOTHING ELSE IF DRAINAGE COMES THROUGH AND ADD A ABD  PAD TO EACH HEEL***** Wound #2 Right,Lateral Calcaneus o Change dressing every week - AT CLINIC ****MAY CHANGE ONLY THE KERLIX AND COBAN WRAP****NOTHING ELSE IF DRAINAGE COMES THROUGH AND ADD A ABD PAD TO EACH HEEL***** Follow-up Appointments Wound #1 Left Calcaneus o Return Appointment in 1 week. Wound #2 Right,Lateral Calcaneus o Return Appointment in 1 week. Edema Control Wound #1 Left Calcaneus o 2 Layer Lite Compression System - Bilateral - kerlix and coban Wound #2 Right,Lateral Calcaneus o 2 Layer Lite Compression System - Bilateral - kerlix and coban Off-Loading Wound #1 Left Calcaneus o Heel suspension boot to: - please float the heels when pt is sitting with feet up or when pt is lying in bed. o Turn and reposition every 2 hours Wound #2 Right,Lateral Calcaneus o Heel suspension boot to: - please float the heels when pt is sitting with feet up or when pt is lying in bed. o Turn and reposition every 2 hours Additional Orders / Instructions Wound #1 Left Calcaneus o Increase protein intake. o Other: - Physical Therapy pt may walk with wheel chair behind patient  during therapy daily. Walk with non-slip foot wear. Wound #2 Right,Lateral Calcaneus o Increase protein intake. Mazurkiewicz, Eustacio L. ([id number]) o Activity as tolerated - Physical Therapy pt may walk with wheel chair behind patient during therapy daily. Walk with non-slip foot wear. o Other: - Continue with bedbath until further notice. Medications-please add to medication list. Wound #1 Left Calcaneus o Other: - Vitamin C, Zinc, Multivitamin Wound #2 Right,Lateral Calcaneus o Other: - Vitamin C, Zinc, Multivitamin Electronic Signature(s) Signed: 07/07/2016 3:57:44 PM By: Baltazar Najjar MD Signed: 07/07/2016 4:36:40 PM By: Alejandro Mulling Entered By: Alejandro Mulling on 07/07/2016 15:17:48 Lio, Bobbye L. ([id number]) -------------------------------------------------------------------------------- Problem List Details Patient Name: Pizzuto, Jaquan L. Date of Service: 07/07/2016 2:15 PM Medical Record Patient Account Number: [id number] Number: Treating RN: Phillis Haggis 08-25-1946 (70 y.o. 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 ([id number]) -------------------------------------------------------------------------------- Progress Note Details Patient  Name: Vanhorne, Saiquan L. Date of Service: 07/07/2016 2:15 PM Medical Record Patient Account Number: [id number] Number: Treating RN: Phillis Haggis [birth year] (70 y.o. Other Clinician: Date of Birth/Sex: Male) Treating ROBSON, Hovanes Primary Care Physician: Montez Morita, MONICA Physician/Extender: G Referring Physician: Kirt Boys Weeks in Treatment: 10 Subjective Chief Complaint Information obtained from Patient Patient is here for our review of bilateral heel ulcers for the last 5-6 months History of Present Illness (HPI) 04/27/16; this is a 70 year old man who is a resident of [facility] in [place]. He is accompanied by a family friend today. She tells me that he has had problems with pressure ulcers since December 2016 largely at a time where he apparently had diabetic hypoglycemic episodes. Apparently these became so severe that he had to be transferred to [facility] for operative debridement apparently bilaterally. I don't have any records from [facility] but per his friend they did not do imaging studies bone  cultures arterial studies etc. Apparently the area just applying wet to dry dressings to these. The patient is a diabetic on insulin. His arterial studies here were noncompressible although his pulses could be dopplered. 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 At one point I note in his notes he was on hospice I'm not really sure whether that is true currently or not. 05/04/16; x-ray of the right heel done in his skilled facility was negative for significant bone pathology. Arterial studies also done in the facility revealed noncompressible vessels therefore no ABI could be calculated. His arterial ultrasound showed low velocities scattered  atherosclerotic disease negative for focal stenosis but monophasic waveforms bilaterally. The overall impression was "mild peripheral arterial disease" 05/11/16; culture last week of the right heel grew MRSA and group B strep. We started doxycycline today. 05/19/16; I started the patient on doxycycline 8 days ago. He has not tolerated this well secondary to gastric irritation. He is also complaining of increasing pain in the right heel. 05/26/16; the patient is completing his Zyvox that I gave him last week. Culture grew heavy group B strep and a few Escherichia coli. Surprisingly did not culture any further MRSA. Apligraf #1 06/09/16 both heal wounds are here for review today. Apligraf #2 06/23/16: pt returns today for ongoing evaluation and management of bilateral calcaneal wounds. Apligraf #3 07/07/16; patient returns today for bilateral calcaneal wounds area left heel is considerably better right is not infected stagnant. Apligraf #4 Objective Dimercurio, Desjuan L. ([id number]) Constitutional Sitting or standing Blood Pressure is within target range for patient.. Pulse regular and within target range for patient.. Temperature is normal and within the target range for the patient.. Patient's appearance is neat and clean. Appears in no acute distress. Well nourished and well developed.. Vitals Time Taken: 2:42 PM, Height: 65 in, Temperature: 97.7 F, Pulse: 76 bpm, Respiratory Rate: 18 breaths/min, Blood Pressure: 121/42 mmHg. Respiratory Above normal respiratory effort noted. Respiratory rate elveaated. Cardiovascular Pedal pulses palpable and strong bilaterally.. General Notes: Wound exam; we're following the patient for wound on the bilateral Achilles area of her calcaneous. The left heel wound is smaller with healthy appearing wound bed but with still some depth. The right heels wound looks relatively unchanged which is disappointing. Granulation still abuts against the calcaneous  itself Integumentary (Hair, Skin) Wound #1 status is Open. Original cause of wound was Pressure Injury. The wound is located on the Left Calcaneus. The wound measures 1.5cm length x 0.7cm width x 0.3cm depth; 0.825cm^2 area and 0.247cm^3 volume. The wound is limited to skin breakdown. There is no tunneling or undermining noted. There is a large amount of serosanguineous drainage noted. The wound margin is thickened. There is large (67-100%) red granulation within the wound bed. There is a small (1-33%) amount of necrotic tissue within the wound bed including Eschar and Adherent Slough. The periwound skin appearance exhibited: Maceration, Moist. Periwound temperature was noted as No Abnormality. The periwound has tenderness on palpation. Wound #2 status is Open. Original cause of wound was Pressure Injury. The wound is located on the Right,Lateral Calcaneus. The wound measures 1.5cm length x 3.5cm width x 0.4cm depth; 4.123cm^2 area and 1.649cm^3 volume. There is bone, muscle, and tendon exposed. There is no tunneling or undermining noted. There is a large amount of serosanguineous drainage noted. The wound margin is thickened. There  is large (67-100%) red granulation within the wound bed. There is a small (1-33%) amount of necrotic tissue within the wound bed including Adherent Slough. The periwound skin appearance exhibited: Localized Edema, Maceration, Moist, Erythema. The surrounding wound skin color is noted with erythema which is circumferential. Periwound temperature was noted as No Abnormality. The periwound has tenderness on palpation. Assessment Active Problems ICD-10 Zeck, Icarus L. ([id number]) E11.621 - Type 2 diabetes mellitus with foot ulcer E11.42 - Type 2 diabetes mellitus with diabetic polyneuropathy L89.614 - Pressure ulcer of right heel, stage 4 L89.623 - Pressure ulcer of left heel, stage 3 I35.0 - Nonrheumatic aortic (valve) stenosis Procedures Wound #2 Wound #2 is  a Pressure Ulcer located on the Right,Lateral Calcaneus. A skin graft procedure using a bioengineered skin substitute/cellular or tissue based product was performed by Maxwell Caul, MD. Apligraf was applied and secured with Steri-Strips. 5.25 sq cm of product was utilized and 38.75 sq cm was wasted due to WOUND SIZE. Post Application, MEPITEL was applied. A Time Out was conducted prior to the start of the procedure. The procedure was tolerated well with a pain level of 0 throughout and a pain level of 0 following the procedure. Post procedure Diagnosis Wound #2: Same as Pre-Procedure . Plan Wound Cleansing: Wound #1 Left Calcaneus: Clean wound with Normal Saline. - FOR CLINIC USE Wound #2 Right,Lateral Calcaneus: Clean wound with Normal Saline. - FOR CLINIC USE Anesthetic: Wound #1 Left Calcaneus: Topical Lidocaine 4% cream applied to wound bed prior to debridement - for clinic use only Wound #2 Right,Lateral Calcaneus: Topical Lidocaine 4% cream applied to wound bed prior to debridement - for clinic use only Skin Barriers/Peri-Wound Care: Wound #1 Left Calcaneus: Barrier cream - FOR CLINIC USE Wound #2 Right,Lateral Calcaneus: Barrier cream - FOR CLINIC USE Primary Wound Dressing: Wound #1 Left Calcaneus: Other: - COLLAGEN DRESSING Wound #2 Right,Lateral Calcaneus: Other: - APLIGRAPG Secondary Dressing: Bleich, Winn L. ([id number]) Wound #1 Left Calcaneus: Dry Gauze XtraSorb - CHARCOAL, HEEL CUP, Mepitel, steri-strips Wound #2 Right,Lateral Calcaneus: Dry Gauze XtraSorb - CHARCOAL, HEEL CUP, Mepitel, steri-strips Dressing Change Frequency: Wound #1 Left Calcaneus: Change dressing every week - AT CLINIC ****MAY CHANGE ONLY THE KERLIX AND COBAN WRAP****NOTHING ELSE IF DRAINAGE COMES THROUGH AND ADD A ABD PAD TO EACH HEEL***** Wound #2 Right,Lateral Calcaneus: Change dressing every week - AT CLINIC ****MAY CHANGE ONLY THE KERLIX AND COBAN WRAP****NOTHING ELSE IF  DRAINAGE COMES THROUGH AND ADD A ABD PAD TO EACH HEEL***** Follow-up Appointments: Wound #1 Left Calcaneus: Return Appointment in 1 week. Wound #2 Right,Lateral Calcaneus: Return Appointment in 1 week. Edema Control: Wound #1 Left Calcaneus: 2 Layer Lite Compression System - Bilateral - kerlix and coban Wound #2 Right,Lateral Calcaneus: 2 Layer Lite Compression System - Bilateral - kerlix and coban Off-Loading: Wound #1 Left Calcaneus: Heel suspension boot to: - please float the heels when pt is sitting with feet up or when pt is lying in bed. Turn and reposition every 2 hours Wound #2 Right,Lateral Calcaneus: Heel suspension boot to: - please float the heels when pt is sitting with feet up or when pt is lying in bed. Turn and reposition every 2 hours Additional Orders / Instructions: Wound #1 Left Calcaneus: Increase protein intake. Other: - Physical Therapy pt may walk with wheel chair behind patient during therapy daily. Walk with non- slip foot wear. Wound #2 Right,Lateral Calcaneus: Increase protein intake. Activity as tolerated - Physical Therapy pt may walk with wheel chair behind patient during  therapy daily. Walk with non-slip foot wear. Other: - Continue with bedbath until further notice. Medications-please add to medication list.: Wound #1 Left Calcaneus: Other: - Vitamin C, Zinc, Multivitamin Wound #2 Right,Lateral Calcaneus: Other: - Vitamin C, Zinc, Multivitamin Jupiter, Dailey L. ([id number]) Appligraf number 4 Electronic Signature(s) Signed: 07/07/2016 3:57:44 PM By: Baltazar Najjar MD Entered By: Baltazar Najjar on 07/07/2016 15:39:57 Griffey, Mattheo L. ([id number]) -------------------------------------------------------------------------------- SuperBill Details Patient Name: Trudel, Keeyon L. Date of Service: 07/07/2016 Medical Record Patient Account Number: [id number] Number: Treating RN: Phillis Haggis [birth year] (70 y.o. 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, stage 4 L89.623 Pressure ulcer of left heel, stage 3 I35.0 Nonrheumatic aortic (valve) stenosis Facility Procedures CPT4 Code: 78295621 Description: (Facility Use Only) Apligraf 1 SQ CM Modifier: Quantity: 44 CPT4 Code: 30865784 Description: 15275 - SKIN SUB GRAFT FACE/NK/HF/G ICD-10 Description Diagnosis 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

## 2016-07-08 NOTE — Progress Notes (Signed)
STONY, STEGMANN ([id number]) Visit Report for 07/07/2016 Arrival Information Details Patient Name: [name], [name] Date of Service: 07/07/2016 2:15 PM Medical Record Number: [id number] Patient Account Number: [id number] Date of Birth/Sex: Aug 17, 1946 (70 y.o. Male) Treating RN: Phillis Haggis Primary Care Physician: Montez Morita, MONICA Other Clinician: Referring Physician: Montez Morita, MONICA Treating Physician/Extender: Maxwell Caul Weeks in Treatment: 10 Visit Information History Since Last Visit All ordered tests and consults were completed: No Patient Arrived: Wheel Chair Added or deleted any medications: No Arrival Time: 14:40 Any new allergies or adverse reactions: No Accompanied By: sister Had a fall or experienced change in No Transfer Assistance: EasyPivot Patient activities of daily living that may affect Lift risk of falls: Patient Identification Verified: Yes Signs or symptoms of abuse/neglect since last No Secondary Verification Process Yes visito Completed: Hospitalized since last visit: No Patient Requires Transmission- No Pain Present Now: No Based Precautions: Patient Has Alerts: Yes Patient Alerts: DM II bil legs non- compressible Electronic Signature(s) Signed: 07/07/2016 4:36:40 PM By: Alejandro Mulling Entered By: Alejandro Mulling on 07/07/2016 14:42:15 Kicklighter, Amore L. ([id number]) -------------------------------------------------------------------------------- Encounter Discharge Information Details Patient Name: Maclaughlin, [name] Date of Service: 07/07/2016 2:15 PM Medical Record Number: [id number] Patient Account Number: [id number] Date of Birth/Sex: December 03, 1946 (70 y.o. Male) Treating RN: Phillis Haggis Primary Care Physician: Montez Morita, MONICA Other Clinician: Referring Physician: Montez Morita, MONICA Treating Physician/Extender: Altamese  in Treatment: 10 Encounter Discharge Information Items Discharge Pain Level: 0 Discharge Condition:  Stable Ambulatory Status: Wheelchair Discharge Destination: Nursing Home Transportation: Other Accompanied By: sister Schedule Follow-up Appointment: Yes Medication Reconciliation completed and provided to Patient/Care Yes Oney Tatlock: Provided on Clinical Summary of Care: 07/07/2016 Form Type Recipient Paper Patient MS Electronic Signature(s) Signed: 07/07/2016 4:36:40 PM By: Alejandro Mulling Previous Signature: 07/07/2016 3:44:29 PM Version By: Gwenlyn Perking Entered By: Alejandro Mulling on 07/07/2016 16:15:36 Dinkins, Zayvien L. ([id number]) -------------------------------------------------------------------------------- Lower Extremity Assessment Details Patient Name: [name], [name] Date of Service: 07/07/2016 2:15 PM Medical Record Number: [id number] Patient Account Number: [id number] Date of Birth/Sex: 25-Aug-1946 (70 y.o. Male) Treating RN: Phillis Haggis Primary Care Physician: Montez Morita, MONICA Other Clinician: Referring Physician: Montez Morita, MONICA Treating Physician/Extender: Maxwell Caul Weeks in Treatment: 10 Vascular Assessment Pulses: Posterior Tibial Dorsalis Pedis Palpable: [Left:No] [Right:No] Doppler: [Left:Monophasic] [Right:Monophasic] Extremity colors, hair growth, and conditions: Extremity Color: [Left:Normal] [Right:Normal] Temperature of Extremity: [Left:Warm] [Right:Warm] Capillary Refill: [Left:< 3 seconds] [Right:< 3 seconds] Electronic Signature(s) Signed: 07/07/2016 4:36:40 PM By: Alejandro Mulling Entered By: Alejandro Mulling on 07/07/2016 14:45:25 Egley, Kree L. ([id number]) -------------------------------------------------------------------------------- Multi Wound Chart Details Patient Name: [name], [name] Date of Service: 07/07/2016 2:15 PM Medical Record Number: [id number] Patient Account Number: [id number] Date of Birth/Sex: [birth year] (70 y.o. Male) Treating RN: Phillis Haggis Primary Care Physician: Montez Morita, MONICA Other  Clinician: Referring Physician: Montez Morita, MONICA Treating Physician/Extender: Maxwell Caul Weeks in Treatment: 10 Vital Signs Height(in): 65 Pulse(bpm): 76 Weight(lbs): Blood Pressure 121/42 (mmHg): Body Mass Index(BMI): Temperature(F): 97.7 Respiratory Rate 18 (breaths/min): Photos: [1:No Photos] [2:No Photos] [N/A:N/A] Wound Location: [1:Left Calcaneus] [2:Right Calcaneus - Lateral] [N/A:N/A] Wounding Event: [1:Pressure Injury] [2:Pressure Injury] [N/A:N/A] Primary Etiology: [1:Pressure Ulcer] [2:Pressure Ulcer] [N/A:N/A] Secondary Etiology: [1:Diabetic Wound/Ulcer of the Lower Extremity] [2:Diabetic Wound/Ulcer of the Lower Extremity] [N/A:N/A] Comorbid History: [1:Anemia, Hypertension, Type II Diabetes] [2:Anemia, Hypertension, Type II Diabetes] [N/A:N/A] Date Acquired: [1:12/11/2015] [2:12/11/2015] [N/A:N/A] Weeks of Treatment: [1:10] [2:10] [N/A:N/A] Wound Status: [1:Open] [2:Open] [N/A:N/A] Measurements L x W x D 1.5x0.7x0.3 [2:1.5x3.5x0.4] [N/A:N/A] (cm) Area (cm) : [1:0.825] [2:4.123] [N/A:N/A] Volume (cm) : [1:0.247] [  2:1.649] [N/A:N/A] % Reduction in Area: [1:86.20%] [2:53.60%] [N/A:N/A] % Reduction in Volume: 79.30% [2:62.90%] [N/A:N/A] Classification: [1:Category/Stage II] [2:Category/Stage IV] [N/A:N/A] HBO Classification: [1:Grade 1] [2:Grade 1] [N/A:N/A] Exudate Amount: [1:Large] [2:Large] [N/A:N/A] Exudate Type: [1:Serosanguineous] [2:Serosanguineous] [N/A:N/A] Exudate Color: [1:red, brown] [2:red, brown] [N/A:N/A] Foul Odor After [1:No] [2:Yes] [N/A:N/A] Cleansing: Odor Anticipated Due to N/A [2:No] [N/A:N/A] Product Use: Wound Margin: [1:Thickened] [2:Thickened] [N/A:N/A] Granulation Amount: [1:Large (67-100%)] [2:Large (67-100%)] [N/A:N/A] Granulation Quality: [1:Red] [2:Red] [N/A:N/A] Necrotic Amount: Small (1-33%) Small (1-33%) N/A Necrotic Tissue: Eschar, Adherent Slough Adherent Slough N/A Exposed Structures: Fascia: No Tendon: Yes  N/A Fat: No Muscle: Yes Tendon: No Bone: Yes Muscle: No Fascia: No Joint: No Fat: No Bone: No Joint: No Limited to Skin Breakdown Epithelialization: None None N/A Periwound Skin Texture: No Abnormalities Noted Edema: Yes N/A Periwound Skin Maceration: Yes Maceration: Yes N/A Moisture: Moist: Yes Moist: Yes Periwound Skin Color: No Abnormalities Noted Erythema: Yes N/A Erythema Location: N/A Circumferential N/A Temperature: No Abnormality No Abnormality N/A Tenderness on Yes Yes N/A Palpation: Wound Preparation: Ulcer Cleansing: Ulcer Cleansing: N/A Rinsed/Irrigated with Rinsed/Irrigated with Saline Saline Topical Anesthetic Topical Anesthetic Applied: Other: lidocaine Applied: Other: lidocaine 4% 4% Treatment Notes Electronic Signature(s) Signed: 07/07/2016 4:36:40 PM By: Alejandro Mulling Entered By: Alejandro Mulling on 07/07/2016 15:00:12 Genova, Jakayden Elbert Ewings ([id number]) -------------------------------------------------------------------------------- Multi-Disciplinary Care Plan Details Patient Name: Placide, Wilian L. Date of Service: 07/07/2016 2:15 PM Medical Record Number: [id number] Patient Account Number: [id number] Date of Birth/Sex: 06-29-1946 (70 y.o. Male) Treating RN: Phillis Haggis Primary Care Physician: Montez Morita, MONICA Other Clinician: Referring Physician: Montez Morita, MONICA Treating Physician/Extender: Maxwell Caul Weeks in Treatment: 10 Active Inactive Abuse / Safety / Falls / Self Care Management Nursing Diagnoses: Potential for falls Goals: Patient will remain injury free Date Initiated: 04/27/2016 Goal Status: Active Interventions: Assess fall risk on admission and as needed Assess self care needs on admission and as needed Notes: Nutrition Nursing Diagnoses: Imbalanced nutrition Goals: Patient/caregiver agrees to and verbalizes understanding of need to use nutritional supplements and/or vitamins as prescribed Date Initiated:  04/27/2016 Goal Status: Active Interventions: Assess patient nutrition upon admission and as needed per policy Notes: Orientation to the Wound Care Program Nursing Diagnoses: Knowledge deficit related to the wound healing center program Goals: Jost, Jovahn Elbert Ewings ([id number]) Patient/caregiver will verbalize understanding of the Wound Healing Center Program Date Initiated: 04/27/2016 Goal Status: Active Interventions: Provide education on orientation to the wound center Notes: Pain, Acute or Chronic Nursing Diagnoses: Pain, acute or chronic: actual or potential Potential alteration in comfort, pain Goals: Patient will verbalize adequate pain control and receive pain control interventions during procedures as needed Date Initiated: 04/27/2016 Goal Status: Active Patient/caregiver will verbalize adequate pain control between visits Date Initiated: 04/27/2016 Goal Status: Active Interventions: Assess comfort goal upon admission Complete pain assessment as per visit requirements Notes: Pressure Nursing Diagnoses: Knowledge deficit related to causes and risk factors for pressure ulcer development Knowledge deficit related to management of pressures ulcers Goals: Patient will remain free from development of additional pressure ulcers Date Initiated: 04/27/2016 Goal Status: Active Interventions: Assess: immobility, friction, shearing, incontinence upon admission and as needed Assess offloading mechanisms upon admission and as needed Assess potential for pressure ulcer upon admission and as needed Notes: Bougie, Rhyse L. ([id number]) Wound/Skin Impairment Nursing Diagnoses: Impaired tissue integrity Goals: Ulcer/skin breakdown will have a volume reduction of 30% by week 4 Date Initiated: 04/27/2016 Goal Status: Active Ulcer/skin breakdown will have a volume reduction of 50% by week 8 Date Initiated: 04/27/2016 Goal Status:  Active Ulcer/skin breakdown will have a volume reduction  of 80% by week 12 Date Initiated: 04/27/2016 Goal Status: Active Interventions: Assess patient/caregiver ability to perform ulcer/skin care regimen upon admission and as needed Assess ulceration(s) every visit Notes: Electronic Signature(s) Signed: 07/07/2016 4:36:40 PM By: Alejandro Mulling Entered By: Alejandro Mulling on 07/07/2016 14:59:09 Grammatico, Draxton L. ([id number]) -------------------------------------------------------------------------------- Pain Assessment Details Patient Name: Miano, Ahmod L. Date of Service: 07/07/2016 2:15 PM Medical Record Number: [id number] Patient Account Number: [id number] Date of Birth/Sex: December 19, 1945 (70 y.o. Male) Treating RN: Phillis Haggis Primary Care Physician: Montez Morita, MONICA Other Clinician: Referring Physician: Montez Morita, MONICA Treating Physician/Extender: Maxwell Caul Weeks in Treatment: 10 Active Problems Location of Pain Severity and Description of Pain Patient Has Paino No Site Locations With Dressing Change: No Pain Management and Medication Current Pain Management: Electronic Signature(s) Signed: 07/07/2016 4:36:40 PM By: Alejandro Mulling Entered By: Alejandro Mulling on 07/07/2016 14:42:22 Hawker, Raynell Elbert Ewings ([id number]) -------------------------------------------------------------------------------- Patient/Caregiver Education Details Patient Name: Nyquist, Octavis L. Date of Service: 07/07/2016 2:15 PM Medical Record Number: [id number] Patient Account Number: [id number] Date of Birth/Gender: 1946/10/06 (70 y.o. Male) Treating RN: Phillis Haggis Primary Care Physician: Montez Morita, MONICA Other Clinician: Referring Physician: Montez Morita, MONICA Treating Physician/Extender: Altamese [place] in Treatment: 10 Education Assessment Education Provided To: Patient Education Topics Provided Wound/Skin Impairment: Handouts: Other: do not get wraps wet Methods: Demonstration, Explain/Verbal Responses: State content  correctly Electronic Signature(s) Signed: 07/07/2016 4:36:40 PM By: Alejandro Mulling Entered By: Alejandro Mulling on 07/07/2016 16:15:58 Mcneil, Vale L. ([id number]) -------------------------------------------------------------------------------- Wound Assessment Details Patient Name: Muratore, Harvest L. Date of Service: 07/07/2016 2:15 PM Medical Record Number: [id number] Patient Account Number: [id number] Date of Birth/Sex: 30-Dec-1945 (70 y.o. 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 Wounding Event: Pressure Injury Wound Status: Open Date Acquired: 12/11/2015 Comorbid Anemia, Hypertension, Type II Weeks Of Treatment: 10 History: Diabetes Clustered Wound: No Photos Photo Uploaded By: Alejandro Mulling on 07/07/2016 16:33:08 Wound Measurements Length: (cm) 1.5 Width: (cm) 0.7 Depth: (cm) 0.3 Area: (cm) 0.825 Volume: (cm) 0.247 % Reduction in Area: 86.2% % Reduction in Volume: 79.3% Epithelialization: None Tunneling: No Undermining: No Wound Description Classification: Category/Stage II Diabetic Severity [address]): Grade 1 Wound Margin: Thickened Exudate Amount: Large Exudate Type: Serosanguineous Exudate Color: red, brown Foul Odor After Cleansing: No Wound Bed Granulation Amount: Large (67-100%) Exposed Structure Granulation Quality: Red Fascia Exposed: No Necrotic Amount: Small (1-33%) Fat Layer Exposed: No Caserta, Mikiah L. ([id number]) Necrotic Quality: Eschar, Adherent Slough Tendon Exposed: No Muscle Exposed: No Joint Exposed: No Bone Exposed: No Limited to Skin Breakdown Periwound Skin Texture Texture Color No Abnormalities Noted: No No  Abnormalities Noted: No Moisture Temperature / Pain No Abnormalities Noted: No Temperature: No Abnormality Maceration: Yes Tenderness on Palpation: Yes Moist: Yes Wound Preparation Ulcer Cleansing: Rinsed/Irrigated with Saline Topical Anesthetic Applied: Other: lidocaine 4%, Treatment Notes Wound #1 (Left Calcaneus) 1. Cleansed with: Cleanse wound with antibacterial soap and water 2. Anesthetic Topical Lidocaine 4% cream to wound bed prior to debridement 4. Dressing Applied: Other dressing (specify in notes) 5. Secondary Dressing Applied ABD Pad Dry Gauze 7. Secured with 2 Layer Lite Compression System - Right Lower Extremity Notes xtrasorb, charcoal, kerlix, coban, collagen dressing Electronic Signature(s) Signed: 07/07/2016 4:36:40 PM By: Alejandro Mulling Entered By: Alejandro Mulling on 07/07/2016  14:58:17 Rutten, Timber L. ([id number]) -------------------------------------------------------------------------------- Wound Assessment Details Patient Name: Delpriore, Dequante L. Date of Service: 07/07/2016 2:15 PM Medical Record Number: [id number] Patient Account Number: [id number] Date of Birth/Sex: 06-15-1946 (70 y.o. Male) Treating RN: Phillis Haggis Primary Care Physician: Montez Morita, MONICA Other Clinician: Referring Physician: Montez Morita, MONICA Treating Physician/Extender: Maxwell Caul Weeks in Treatment: 10 Wound Status Wound Number: 2 Primary Etiology: Pressure Ulcer Wound Location: Right Calcaneus - Lateral Secondary Diabetic Wound/Ulcer of the Lower Etiology: Extremity Wounding Event: Pressure Injury Wound Status: Open Date Acquired: 12/11/2015 Comorbid Anemia, Hypertension, Type II Weeks Of Treatment: 10 History: Diabetes Clustered Wound: No Photos Photo Uploaded By: Alejandro Mulling on 07/07/2016 16:33:09 Wound Measurements Length: (cm) 1.5 Width: (cm) 3.5 Depth: (cm) 0.4 Area: (cm) 4.123 Volume: (cm) 1.649 % Reduction in Area: 53.6% % Reduction in  Volume: 62.9% Epithelialization: None Tunneling: No Undermining: No Wound Description Classification: Category/Stage IV Foul Odor A Diabetic Severity ([place]): Grade 1 Due to Prod Wound Margin: Thickened Exudate Amount: Large Exudate Type: Serosanguineous Exudate Color: red, brown fter Cleansing: Yes uct Use: No Wound Bed Granulation Amount: Large (67-100%) Exposed Structure Granulation Quality: Red Fascia Exposed: No Necrotic Amount: Small (1-33%) Fat Layer Exposed: No Mikhail, Jonnatan L. ([id number]) Necrotic Quality: Adherent Slough Tendon Exposed: Yes Muscle Exposed: Yes Necrosis of Muscle: No Joint Exposed: No Bone Exposed: Yes Periwound Skin Texture Texture Color No Abnormalities Noted: No No Abnormalities Noted: No Localized Edema: Yes Erythema: Yes Erythema Location: Circumferential Moisture No Abnormalities Noted: No Temperature / Pain Maceration: Yes Temperature: No Abnormality Moist: Yes Tenderness on Palpation: Yes Wound Preparation Ulcer Cleansing: Rinsed/Irrigated with Saline Topical Anesthetic Applied: Other: lidocaine 4%, Treatment Notes Wound #2 (Right, Lateral Calcaneus) 1. Cleansed with: Cleanse wound with antibacterial soap and water 2. Anesthetic Topical Lidocaine 4% cream to wound bed prior to debridement 3. Peri-wound Care: Skin Prep 4. Dressing Applied: Other dressing (specify in notes) 5. Secondary Dressing Applied ABD Pad Dry Gauze 7. Secured with 2 Layer Lite Compression System - Bilateral Notes xtrasorb, charcoal, kerlix, coban, Apligraph Electronic Signature(s) Signed: 07/07/2016 4:36:40 PM By: Alejandro Mulling Entered By: Alejandro Mulling on 07/07/2016 14:59:02 Basden, Ryot L. ([id number]) -------------------------------------------------------------------------------- Vitals Details Patient Name: Novick, Brently L. Date of Service: 07/07/2016 2:15 PM Medical Record Number: [id number] Patient Account Number: [id number] Date  of Birth/Sex: 1946-05-03 (70 y.o. Male) Treating RN: Phillis Haggis Primary Care Physician: Montez Morita, MONICA Other Clinician: Referring Physician: Montez Morita, MONICA Treating Physician/Extender: Maxwell Caul Weeks in Treatment: 10 Vital Signs Time Taken: 14:42 Temperature (F): 97.7 Height (in): 65 Pulse (bpm): 76 Respiratory Rate (breaths/min): 18 Blood Pressure (mmHg): 121/42 Reference Range: 80 - 120 mg / dl Electronic Signature(s) Signed: 07/07/2016 4:36:40 PM By: Alejandro Mulling Entered By: Alejandro Mulling on 07/07/2016 14:44:32

## 2016-07-12 ENCOUNTER — Other Ambulatory Visit: Payer: Self-pay

## 2016-07-12 LAB — TSH: TSH: 2.94 u[IU]/mL (ref 0.41–5.90)

## 2016-07-12 MED ORDER — HYDROMORPHONE HCL 2 MG PO TABS: 1.0000 mg | ORAL_TABLET | Freq: Four times a day (QID) | ORAL | 0 refills | Status: AC | PRN

## 2016-07-12 NOTE — Telephone Encounter (Signed)
Faxed to [facility] Fax Number: [phone], Phone Number [phone]  

## 2016-07-14 ENCOUNTER — Encounter: Payer: PRIVATE HEALTH INSURANCE | Attending: Internal Medicine

## 2016-07-14 DIAGNOSIS — L89614 Pressure ulcer of right heel, stage 4: Secondary | ICD-10-CM | POA: Insufficient documentation

## 2016-07-14 DIAGNOSIS — K7460 Unspecified cirrhosis of liver: Secondary | ICD-10-CM | POA: Insufficient documentation

## 2016-07-14 DIAGNOSIS — D61818 Other pancytopenia: Secondary | ICD-10-CM | POA: Diagnosis not present

## 2016-07-14 DIAGNOSIS — I350 Nonrheumatic aortic (valve) stenosis: Secondary | ICD-10-CM | POA: Diagnosis not present

## 2016-07-14 DIAGNOSIS — Z794 Long term (current) use of insulin: Secondary | ICD-10-CM | POA: Insufficient documentation

## 2016-07-14 DIAGNOSIS — E11621 Type 2 diabetes mellitus with foot ulcer: Secondary | ICD-10-CM | POA: Diagnosis present

## 2016-07-14 DIAGNOSIS — E1142 Type 2 diabetes mellitus with diabetic polyneuropathy: Secondary | ICD-10-CM | POA: Diagnosis not present

## 2016-07-14 DIAGNOSIS — K7290 Hepatic failure, unspecified without coma: Secondary | ICD-10-CM | POA: Insufficient documentation

## 2016-07-14 DIAGNOSIS — K766 Portal hypertension: Secondary | ICD-10-CM | POA: Diagnosis not present

## 2016-07-14 DIAGNOSIS — L89623 Pressure ulcer of left heel, stage 3: Secondary | ICD-10-CM | POA: Diagnosis not present

## 2016-07-15 LAB — TSH: TSH: 2.94 u[IU]/mL (ref 0.41–5.90)

## 2016-07-15 NOTE — Progress Notes (Signed)
GWEN, EDLER ([id number]) Visit Report for 07/14/2016 Arrival Information Details Patient Name: [name] [name] L. Date of Service: 07/14/2016 3:15 PM Medical Record Patient Account Number: [id number] Number: Treating RN: Phillis Haggis Aug 11, 1946 (70 y.o. Other Clinician: Date of Birth/Sex: Male) Treating [name] [name] Primary Care Physician: Montez Morita, MONICA Physician/Extender: G Referring Physician: Montez Morita MONICA Weeks in Treatment: 11 Visit Information History Since Last Visit All ordered tests and consults were completed: No Patient Arrived: Wheel Chair Added or deleted any medications: No Arrival Time: 15:53 Any new allergies or adverse reactions: No Accompanied By: friend Had a fall or experienced change in No Transfer Assistance: EasyPivot Patient activities of daily living that may affect Lift risk of falls: Patient Identification Verified: Yes Signs or symptoms of abuse/neglect since last No Secondary Verification Process Yes visito Completed: Hospitalized since last visit: No Patient Requires Transmission- No Pain Present Now: No Based Precautions: Patient Has Alerts: Yes Patient Alerts: DM II bil legs non- compressible Electronic Signature(s) Signed: 07/14/2016 4:44:15 PM By: Alejandro Mulling Entered By: Alejandro Mulling on 07/14/2016 15:53:51 [name] [name] L. ([id number]) -------------------------------------------------------------------------------- Clinic Level of Care Assessment Details Patient Name: [name] [name] L. Date of Service: 07/14/2016 3:15 PM Medical Record Patient Account Number: [id number] Number: Treating RN: Phillis Haggis [birth year] (70 y.o. Other Clinician: Date of Birth/Sex: Male) Treating [name] [name] Primary Care Physician: Montez Morita, MONICA Physician/Extender: G Referring Physician: Montez Morita MONICA Weeks in Treatment: 11 Clinic Level of Care Assessment Items TOOL 4 Quantity Score X - Use when only an  EandM is performed on FOLLOW-UP visit 1 0 ASSESSMENTS - Nursing Assessment / Reassessment X - Reassessment of Co-morbidities (includes updates in patient status) 1 10 X - Reassessment of Adherence to Treatment Plan 1 5 ASSESSMENTS - Wound and Skin Assessment / Reassessment []  - Simple Wound Assessment / Reassessment - one wound 0 X - Complex Wound Assessment / Reassessment - multiple wounds 2 5 []  - Dermatologic / Skin Assessment (not related to wound area) 0 ASSESSMENTS - Focused Assessment []  - Circumferential Edema Measurements - multi extremities 0 []  - Nutritional Assessment / Counseling / Intervention 0 []  - Lower Extremity Assessment (monofilament, tuning fork, pulses) 0 []  - Peripheral Arterial Disease Assessment (using hand held doppler) 0 ASSESSMENTS - Ostomy and/or Continence Assessment and Care []  - Incontinence Assessment and Management 0 []  - Ostomy Care Assessment and Management (repouching, etc.) 0 PROCESS - Coordination of Care []  - Simple Patient / Family Education for ongoing care 0 X - Complex (extensive) Patient / Family Education for ongoing care 1 20 []  - Staff obtains Chiropractor, Records, Test Results / Process Orders 0 X - Staff telephones HHA, Nursing Homes / Clarify orders / etc 1 10 [name] [name] L. ([id number]) []  - Routine Transfer to another Facility (non-emergent condition) 0 []  - Routine Hospital Admission (non-emergent condition) 0 []  - New Admissions / Manufacturing engineer / Ordering NPWT, Apligraf, etc. 0 []  - Emergency Hospital Admission (emergent condition) 0 X - Simple Discharge Coordination 1 10 []  - Complex (extensive) Discharge Coordination 0 PROCESS - Special Needs []  - Pediatric / Minor Patient Management 0 []  - Isolation Patient Management 0 []  - Hearing / Language / Visual special needs 0 []  - Assessment of Community assistance (transportation, D/C planning, etc.) 0 []  - Additional assistance / Altered mentation 0 []  - Support  Surface(s) Assessment (bed, cushion, seat, etc.) 0 INTERVENTIONS - Wound Cleansing / Measurement []  - Simple Wound Cleansing - one wound 0 []  - Complex Wound Cleansing - multiple wounds  0  - Wound Imaging (photographs - any number of wounds) 0  - Wound Tracing (instead of photographs) 0  - Simple Wound Measurement - one wound 0  - Complex Wound Measurement - multiple wounds 0 INTERVENTIONS - Wound Dressings  - Small Wound Dressing one or multiple wounds 0  - Medium Wound Dressing one or multiple wounds 0 X - Large Wound Dressing one or multiple wounds 2 20  - Application of Medications - topical 0  - Application of Medications - injection 0 [name] [name] L. ([id number]) INTERVENTIONS - Miscellaneous  - External ear exam 0  - Specimen Collection (cultures, biopsies, blood, body fluids, etc.) 0  - Specimen(s) / Culture(s) sent or taken to Lab for analysis 0 X - Patient Transfer (multiple staff / Nurse, adult / Similar devices) 1 10  - Simple Staple / Suture removal (25 or less) 0  - Complex Staple / Suture removal (26 or more) 0  - Hypo / Hyperglycemic Management (close monitor of Blood Glucose) 0  - Ankle / Brachial Index (ABI) - do not check if billed separately 0 X - Vital Signs 1 5 Has the patient been seen at the hospital within the last three years: Yes Total Score: 120 Level Of Care: New/Established - Level 4 Electronic Signature(s) Signed: 07/14/2016 4:44:15 PM By: Alejandro Mulling Entered By: Alejandro Mulling on 07/14/2016 16:26:59 [name] [name] L. ([id number]) -------------------------------------------------------------------------------- Encounter Discharge Information Details Patient Name: [name], [name] Date of Service: 07/14/2016 3:15 PM Medical Record Patient Account Number: [id number] Number: Treating RN: Phillis Haggis 1946-12-05 (70 y.o. Other Clinician: Date of Birth/Sex: Male) Treating [name] [name] Primary Care  Physician: Montez Morita, MONICA Physician/Extender: G Referring Physician: Kirt Boys Weeks in Treatment: 11 Encounter Discharge Information Items Discharge Pain Level: 0 Discharge Condition: Stable Ambulatory Status: Wheelchair Nursing Discharge Destination: Home Transportation: Private Auto Accompanied By: friend Schedule Follow-up Appointment: Yes Medication Reconciliation completed Yes and provided to Patient/Care Tarahji Ramthun: Clinical Summary of Care: Electronic Signature(s) Signed: 07/14/2016 4:44:15 PM By: Alejandro Mulling Entered By: Alejandro Mulling on 07/14/2016 16:26:24 [name] [name] Elbert Ewings ([id number]) -------------------------------------------------------------------------------- Patient/Caregiver Education Details Patient Name: [name] [name] L. Date of Service: 07/14/2016 3:15 PM Medical Record Patient Account Number: [id number] Number: Treating RN: Phillis Haggis [birth year] (70 y.o. Other Clinician: Date of Birth/Gender: Male) Treating [name], Sasuke Primary Care Physician: Montez Morita, MONICA Physician/Extender: G Referring Physician: Zenda Alpers in Treatment: 11 Education Assessment Education Provided To: Patient and Caregiver Education Topics Provided Wound/Skin Impairment: Handouts: Other: do not get wraps wet Methods: Demonstration, Explain/Verbal Responses: State content correctly Electronic Signature(s) Signed: 07/14/2016 4:44:15 PM By: Alejandro Mulling Entered By: Alejandro Mulling on 07/14/2016 16:26:10 [name] [name] L. ([id number]) -------------------------------------------------------------------------------- Wound Assessment Details Patient Name: [name] [name] L. Date of Service: 07/14/2016 3:15 PM Medical Record Patient Account Number: [id number] Number: Treating RN: Phillis Haggis 1946/01/15 (70 y.o. 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 Event: Pressure Injury Etiology: Lower Extremity Date Acquired: 12/11/2015 Wound Status: Open Weeks Of Treatment: 11 Comorbid Anemia, Hypertension, Type II Clustered Wound: No History: Diabetes Wound Measurements Length: (cm) 1.5 Width: (cm) 0.7 Depth: (cm) 0.3 Area: (cm) 0.825 Volume: (cm) 0.247 % Reduction in Area: 86.2% % Reduction in Volume: 79.3% Epithelialization: None Tunneling: No Undermining: No Wound Description Classification: Category/Stage II Diabetic Severity [address]): Grade 1  Wound Margin: Thickened Exudate Amount: Large Exudate Type: Serosanguineous Exudate Color: red, brown Foul Odor After Cleansing: No Wound Bed Granulation Amount: Large (67-100%) Exposed Structure Granulation Quality: Red Fascia Exposed: No Necrotic Amount: Small (1-33%) Fat Layer Exposed: No Necrotic Quality: Eschar, Adherent Slough Tendon Exposed: No Muscle Exposed: No Joint Exposed: No Bone Exposed: No Limited to Skin Breakdown Periwound Skin Texture Texture Color No Abnormalities Noted: No No Abnormalities Noted: No Tackett, Nakota L. ([id number]) Moisture Temperature / Pain No Abnormalities Noted: No Temperature: No Abnormality Maceration: Yes Tenderness on Palpation: Yes Moist: Yes Wound Preparation Ulcer Cleansing: Rinsed/Irrigated with Saline Topical Anesthetic Applied: None Assessment Notes Same measurements because there was no way to measure the wound, this was a nurse visit to check the Apligraph. Treatment Notes Wound #1 (Left Calcaneus) 5. Secondary Dressing Applied ABD Pad Dry Gauze 7. Secured with Tape Notes Carboflex, xtrasorb, kerlix, Theatre manager) Signed: 07/14/2016 4:44:15 PM By: Alejandro Mulling Entered By: Alejandro Mulling on 07/14/2016 15:55:21 Mort, Devron L. ([id number]) -------------------------------------------------------------------------------- Wound Assessment Details Patient Name: Dowdell, [name] L. Date of Service: 07/14/2016 3:15 PM Medical Record Patient Account Number: [id number] Number: Treating RN: Phillis Haggis 05/29/1946 (70 y.o. Other Clinician: Date of Birth/Sex: Male) Treating [name], Hasten Primary Care Physician: Montez Morita, MONICA Physician/Extender: G Referring Physician: Montez Morita, MONICA Weeks in Treatment: 11 Wound Status Wound Number: 2 Primary Pressure Ulcer Etiology: Wound Location: Right Calcaneus - Lateral Secondary Diabetic Wound/Ulcer of the Wounding Event: Pressure Injury Etiology: Lower Extremity Date Acquired: 12/11/2015 Wound Status: Open Weeks Of Treatment: 11 Comorbid Anemia, Hypertension, Type II Clustered Wound: No History: Diabetes Wound Measurements Length: (cm) 1.5 Width: (cm) 3.5 Depth: (cm) 0.4 Area: (cm) 4.123 Volume: (cm) 1.649 % Reduction in Area: 53.6% % Reduction in Volume: 62.9% Epithelialization: None Tunneling: No Undermining: No Wound Description Classification: Category/Stage IV Foul Odor A Diabetic Severity ([place]): Grade 1 Due to Prod Wound Margin: Thickened Exudate Amount: Large Exudate Type: Serosanguineous Exudate Color: red, brown fter Cleansing: Yes uct Use: No Wound Bed Granulation Amount: Large (67-100%) Exposed Structure Granulation Quality: Red Fascia Exposed: No Necrotic Amount: Small (1-33%) Fat Layer Exposed: No Necrotic Quality: Adherent Slough Tendon Exposed: Yes Muscle Exposed: Yes Necrosis of Muscle: No Joint Exposed: No Bone Exposed: Yes Periwound Skin Texture Texture Color No Abnormalities Noted: No No Abnormalities Noted: No Albee, Arnold L. ([id number]) Localized Edema: Yes Erythema: Yes Erythema Location: Circumferential Moisture No  Abnormalities Noted: No Temperature / Pain Maceration: Yes Temperature: No Abnormality Moist: Yes Tenderness on Palpation: Yes Wound Preparation Ulcer Cleansing: Rinsed/Irrigated with Saline Topical Anesthetic Applied: None Assessment Notes Same measurements because there was no way to measure the wound, this was a nurse visit to check the Apligraph. Treatment Notes Wound #2 (Right, Lateral Calcaneus) 5. Secondary Dressing Applied ABD Pad Dry Gauze 7. Secured with Tape Notes Carboflex, xtrasorb, kerlix, Theatre manager) Signed: 07/14/2016 4:44:15 PM By: Alejandro Mulling Entered By: Alejandro Mulling on 07/14/2016 15:55:50

## 2016-07-21 ENCOUNTER — Encounter: Payer: PRIVATE HEALTH INSURANCE | Admitting: Surgery

## 2016-07-21 ENCOUNTER — Non-Acute Institutional Stay (SKILLED_NURSING_FACILITY): Payer: Medicare Other | Admitting: Nurse Practitioner

## 2016-07-21 ENCOUNTER — Encounter: Payer: Self-pay | Admitting: Nurse Practitioner

## 2016-07-21 DIAGNOSIS — E1140 Type 2 diabetes mellitus with diabetic neuropathy, unspecified: Secondary | ICD-10-CM

## 2016-07-21 DIAGNOSIS — E038 Other specified hypothyroidism: Secondary | ICD-10-CM | POA: Diagnosis not present

## 2016-07-21 DIAGNOSIS — N400 Enlarged prostate without lower urinary tract symptoms: Secondary | ICD-10-CM | POA: Diagnosis not present

## 2016-07-21 DIAGNOSIS — E1165 Type 2 diabetes mellitus with hyperglycemia: Secondary | ICD-10-CM | POA: Diagnosis not present

## 2016-07-21 DIAGNOSIS — N183 Chronic kidney disease, stage 3 unspecified: Secondary | ICD-10-CM

## 2016-07-21 DIAGNOSIS — Z794 Long term (current) use of insulin: Secondary | ICD-10-CM

## 2016-07-21 DIAGNOSIS — E11621 Type 2 diabetes mellitus with foot ulcer: Secondary | ICD-10-CM | POA: Diagnosis not present

## 2016-07-21 DIAGNOSIS — E034 Atrophy of thyroid (acquired): Secondary | ICD-10-CM | POA: Diagnosis not present

## 2016-07-21 DIAGNOSIS — IMO0002 Reserved for concepts with insufficient information to code with codable children: Secondary | ICD-10-CM

## 2016-07-21 DIAGNOSIS — F29 Unspecified psychosis not due to a substance or known physiological condition: Secondary | ICD-10-CM

## 2016-07-21 DIAGNOSIS — L89600 Pressure ulcer of unspecified heel, unstageable: Secondary | ICD-10-CM | POA: Diagnosis not present

## 2016-07-21 DIAGNOSIS — R600 Localized edema: Secondary | ICD-10-CM

## 2016-07-21 DIAGNOSIS — K7030 Alcoholic cirrhosis of liver without ascites: Secondary | ICD-10-CM | POA: Diagnosis not present

## 2016-07-21 NOTE — Progress Notes (Addendum)
[name] NakaiSHORT, Brayton L. ([id number]) Visit Report for 07/21/2016 Chief Complaint Document Details Patient Name: [name], [name] Date of Service: 07/21/2016 9:15 AM Medical Record Number: [id number] Patient Account Number: [id number] Date of Birth/Sex: 05/06/1946 [age] y.o. Male) Treating RN: Curtis Sitesorthy, Joanna Primary Care Physician: Montez MoritaARTER, MONICA Other Clinician: Referring Physician: Montez MoritaARTER, MONICA Treating Physician/Extender: [name] [name] in Treatment: 12 Information Obtained from: Patient Chief Complaint Patient is here for our review of bilateral heel ulcers for the last 5-6 months Electronic Signature(s) Signed: 07/21/2016 9:47:17 AM By: [name] KannerBritto, Jalyiah Shelley MD, FACS Entered By: [name] on 07/21/2016 09:47:17 [name], [name] ([id number]) -------------------------------------------------------------------------------- Cellular or Tissue Based Product Details Patient Name: [name], [name] Date of Service: 07/21/2016 9:15 AM Medical Record Number: [id number] Patient Account Number: [id number] Date of Birth/Sex: 05/06/1946 [age] y.o. Male) Treating RN: Curtis Sitesorthy, Joanna Primary Care Physician: Montez MoritaARTER, MONICA Other Clinician: Referring Physician: Montez MoritaARTER, MONICA Treating Physician/Extender: [name] [name] [name] in Treatment: 12 Cellular or Tissue Based Wound #2 Right,Lateral Calcaneus Product Type Applied to: Performed By: Physician [name] KannerBritto, Shahiem Bedwell, MD Cellular or Tissue Based Apligraf Product Type: Time-Out Taken: Yes Location: trunk / arms / legs Wound Size (sq cm): 3.52 Product Size (sq cm): 44 Waste Size (sq cm): 40 Waste Reason: wound size Amount of Product Applied (sq cm): 4 Lot #: [id number] Expiration Date: 07/29/2016 Fenestrated: Yes Instrument: Blade Reconstituted: Yes Solution Type: saline Solution Amount: 2ml Lot #: [id number] Solution Expiration 04/12/2018 Date: Secured: Yes Secured With: Steri-Strips Dressing Applied: Yes Primary Dressing: mepitel one Procedural Pain:  0 Post Procedural Pain: 0 Response to Treatment: Procedure was tolerated well Post Procedure Diagnosis Same as Pre-procedure Electronic Signature(s) Signed: 07/21/2016 9:47:07 AM By: [name] KannerBritto, Jalaysha Skilton MD, FACS Entered By: [name] on 07/21/2016 09:47:07 [name], [name] ([id number]) [name], [name] ([id number]) -------------------------------------------------------------------------------- HPI Details Patient Name: [name], [name] Date of Service: 07/21/2016 9:15 AM Medical Record Number: [id number] Patient Account Number: [id number] Date of Birth/Sex: 05/06/1946 [age] y.o. Male) Treating RN: Curtis Sitesorthy, Joanna Primary Care Physician: Montez MoritaARTER, MONICA Other Clinician: Referring Physician: Montez MoritaARTER, MONICA Treating Physician/Extender: [name] [name] in Treatment: 12 History of Present Illness HPI Description: 04/27/16; this is a 70 year old man who is a resident of [facility] [place]. He is accompanied by a family friend today. She tells me that he has had problems with pressure ulcers since December 2016 largely at a time where he apparently had diabetic hypoglycemic episodes. Apparently these became so severe that he had to be transferred to [facility] for operative debridement apparently bilaterally. I don't have any records from [facility] but per his friend they did not do imaging studies bone cultures arterial studies etc. Apparently the area just applying wet to dry dressings to these. The patient is a diabetic on insulin. His arterial studies here were noncompressible although his pulses could be dopplered. 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 At one point I note in his notes he was on hospice I'm not really sure whether that is true currently or not. 05/04/16;  x-ray of the right heel done in his skilled facility was negative for significant bone pathology. Arterial studies also done in the facility revealed noncompressible vessels therefore no ABI could be calculated. His arterial ultrasound showed low velocities scattered atherosclerotic disease negative for focal stenosis but monophasic waveforms bilaterally. The overall impression was "mild peripheral arterial disease" 05/11/16; culture last  week of the right heel grew MRSA and group B strep. We started doxycycline today. 05/19/16; I started the patient on doxycycline 8 days ago. He has not tolerated this well secondary to gastric irritation. He is also complaining of increasing pain in the right heel. 05/26/16; the patient is completing his Zyvox that I gave him last week. Culture grew heavy group B strep and a few Escherichia coli. Surprisingly did not culture any further MRSA. Apligraf #1 06/09/16 both heal wounds are here for review today. Apligraf #2 06/23/16: pt returns today for ongoing evaluation and management of bilateral calcaneal wounds. Apligraf #3 07/07/16; patient returns today for bilateral calcaneal wounds area left heel is considerably better right is not infected stagnant. Apligraf #4 07/21/2016 -- He is here for Apligraf #5 Electronic Signature(s) Signed: 07/21/2016 9:47:52 AM By: [name] Kanner MD, FACS Entered By: [name] Kanner on 07/21/2016 09:47:52 [name] ([id number]) -------------------------------------------------------------------------------- Physical Exam Details Patient Name: [name], [name] Date of Service: 07/21/2016 9:15 AM Medical Record Number: [id number] Patient Account Number: [id number] Date of Birth/Sex: Oct 23, 1946 (70 y.o. Male) Treating RN: Curtis Sites Primary Care Physician: Montez Morita, MONICA Other Clinician: Referring Physician: Montez Morita, MONICA Treating Physician/Extender: [name] Re in Treatment: 12 Constitutional . Pulse regular.  Respirations normal and unlabored. Afebrile. . Eyes Nonicteric. Reactive to light. Ears, Nose, Mouth, and Throat Lips, teeth, and gums WNL.Marland Kitchen Moist mucosa without lesions. Neck supple and nontender. No palpable supraclavicular or cervical adenopathy. Normal sized without goiter. Respiratory WNL. No retractions.. Cardiovascular Pedal Pulses WNL. No clubbing, cyanosis or edema. Lymphatic No adneopathy. No adenopathy. No adenopathy. Musculoskeletal Adexa without tenderness or enlargement.. Digits and nails w/o clubbing, cyanosis, infection, petechiae, ischemia, or inflammatory conditions.. Integumentary (Hair, Skin) No suspicious lesions. No crepitus or fluctuance. No peri-wound warmth or erythema. No masses.Marland Kitchen Psychiatric Judgement and insight Intact.. No evidence of depression, anxiety, or agitation.. Notes the wound on the right lateral calcaneum is fairly clean and I washed it out well with saline and removed some debris and he has had the next Apligraf applied and a bolster placed and Steri-Stripped in place. The remainder of the Apligraf was applied to the left heel medial calcaneal area. Electronic Signature(s) Signed: 07/21/2016 9:50:07 AM By: [name] Kanner MD, FACS Previous Signature: 07/21/2016 9:49:39 AM Version By: [name] Kanner MD, FACS Entered By: [name] Kanner on 07/21/2016 09:50:06 Diprima, [name] ([id number]) -------------------------------------------------------------------------------- Physician Orders Details Patient Name: Carbo, [name] Date of Service: 07/21/2016 9:15 AM Medical Record Number: [id number] Patient Account Number: [id number] Date of Birth/Sex: 1946-02-23 (70 y.o. Male) Treating RN: Curtis Sites Primary Care Physician: Montez Morita, MONICA Other Clinician: Referring Physician: Montez Morita, MONICA Treating Physician/Extender: [name] Re in Treatment: 59 Verbal / Phone Orders: Yes Clinician: Curtis Sites Read Back and Verified: Yes Diagnosis  Coding Wound Cleansing Wound #1 Left Calcaneus o Clean wound with Normal Saline. - FOR CLINIC USE Wound #2 Right,Lateral Calcaneus o Clean wound with Normal Saline. - FOR CLINIC USE Anesthetic Wound #1 Left Calcaneus o Topical Lidocaine 4% cream applied to wound bed prior to debridement - for clinic use only Wound #2 Right,Lateral Calcaneus o Topical Lidocaine 4% cream applied to wound bed prior to debridement - for clinic use only Skin Barriers/Peri-Wound Care Wound #1 Left Calcaneus o Barrier cream - FOR CLINIC USE Wound #2 Right,Lateral Calcaneus o Barrier cream - FOR CLINIC USE Primary Wound Dressing Wound #1 Left Calcaneus o Other: - COLLAGEN DRESSING Wound #2 Right,Lateral Calcaneus o Other: - APLIGRAF Secondary Dressing Wound #1 Left Calcaneus o  Dry Gauze o XtraSorb - CHARCOAL, HEEL CUP, Mepitel, steri-strips Wound #2 Right,Lateral Calcaneus o Dry Gauze o XtraSorb - CHARCOAL, HEEL CUP, Mepitel, steri-strips Alonge, Jeshawn L. ([id number]) Dressing Change Frequency Wound #1 Left Calcaneus o Change dressing every week - AT CLINIC ****MAY CHANGE ONLY THE KERLIX AND COBAN WRAP****NOTHING ELSE IF DRAINAGE COMES THROUGH AND ADD A ABD PAD TO EACH HEEL***** Wound #2 Right,Lateral Calcaneus o Change dressing every week - AT CLINIC ****MAY CHANGE ONLY THE KERLIX AND COBAN WRAP****NOTHING ELSE IF DRAINAGE COMES THROUGH AND ADD A ABD PAD TO EACH HEEL***** Follow-up Appointments Wound #1 Left Calcaneus o Return Appointment in 1 week. - for nurse visit Wound #2 Right,Lateral Calcaneus o Return Appointment in 1 week. - for nurse visit Edema Control Wound #1 Left Calcaneus o 2 Layer Lite Compression System - Bilateral - kerlix and coban Wound #2 Right,Lateral Calcaneus o 2 Layer Lite Compression System - Bilateral - kerlix and coban Off-Loading Wound #1 Left Calcaneus o Heel suspension boot to: - please float the heels when pt is sitting  with feet up or when pt is lying in bed. o Turn and reposition every 2 hours Wound #2 Right,Lateral Calcaneus o Heel suspension boot to: - please float the heels when pt is sitting with feet up or when pt is lying in bed. o Turn and reposition every 2 hours Additional Orders / Instructions Wound #1 Left Calcaneus o Increase protein intake. o Other: - Physical Therapy - patient is not to walk. Patient may stand for transfers and stand for therapy but no walking distances Wound #2 Right,Lateral Calcaneus o Increase protein intake. o Other: - Physical Therapy - patient is not to walk. Patient may stand for transfers and stand for therapy but no walking distances [name], [name] ([id number]) Medications-please add to medication list. Wound #1 Left Calcaneus o Other: - Vitamin C, Zinc, Multivitamin Wound #2 Right,Lateral Calcaneus o Other: - Vitamin C, Zinc, Multivitamin Electronic Signature(s) Signed: 07/21/2016 11:46:41 AM By: [name] Kanner MD, FACS Signed: 07/21/2016 3:42:15 PM By: Curtis Sites Entered By: Curtis Sites on 07/21/2016 10:04:31 Cedrone, [name] ([id number]) -------------------------------------------------------------------------------- Problem List Details Patient Name: Clavel, Torrin L. Date of Service: 07/21/2016 9:15 AM Medical Record Number: [id number] Patient Account Number: [id number] Date of Birth/Sex: 21-Jan-1946 (70 y.o. 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 ([id number]) -------------------------------------------------------------------------------- Progress Note Details Patient Name: [name], [name] Date of Service: 07/21/2016 9:15 AM Medical Record Number: [id number] Patient Account Number: [id number] Date of Birth/Sex: September 10, 1946 (70 y.o. Male) Treating RN: Curtis Sites Primary Care Physician: Montez Morita, MONICA Other Clinician: Referring Physician: Montez Morita, MONICA Treating Physician/Extender: [name] Re in Treatment: 12 Subjective Chief Complaint Information obtained from Patient Patient is here for our review of bilateral heel ulcers for the last 5-6 months History of Present Illness (HPI) 04/27/16; this is a 70 year old man who is a resident of [facility] in [place]. He is accompanied by a family  friend today. She tells me that he has had problems with pressure ulcers since December 2016 largely at a time where he apparently had diabetic hypoglycemic episodes. Apparently these became so severe that he had to be transferred to [facility] for operative debridement apparently bilaterally. I don't have any records from [facility] but per his friend they did not do imaging studies bone cultures arterial studies etc. Apparently the area just applying wet to dry dressings to these. The patient is a diabetic on insulin. His arterial studies here were noncompressible although his pulses could be dopplered. 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 At one point I note in his notes he  was on hospice I'm not really sure whether that is true currently or not. 05/04/16; x-ray of the right heel done in his skilled facility was negative for significant bone pathology. Arterial studies also done in the facility revealed noncompressible vessels therefore no ABI could be calculated. His arterial ultrasound showed low velocities scattered atherosclerotic disease negative for focal stenosis but monophasic waveforms bilaterally. The overall impression was "mild peripheral arterial disease" 05/11/16; culture last week of the right heel grew MRSA and group B strep. We started doxycycline today. 05/19/16; I started the patient on doxycycline 8 days ago. He has not tolerated this well secondary to gastric irritation. He is also complaining of increasing pain in the right heel. 05/26/16; the patient is completing his Zyvox that I gave him last week. Culture grew heavy group B strep and a few Escherichia coli. Surprisingly did not culture any further MRSA. Apligraf #1 06/09/16 both heal wounds are here for review today. Apligraf #2 06/23/16: pt returns today for ongoing evaluation and management of bilateral calcaneal wounds. Apligraf #3 07/07/16; patient returns today for bilateral calcaneal wounds area left heel is considerably better right is not infected stagnant. Apligraf #4 07/21/2016 -- He is here for Apligraf #5 Objective [name], [name] ([id number]) Constitutional Pulse regular. Respirations normal and unlabored. Afebrile. Vitals Time Taken: 9:14 AM, Height: 65 in, Temperature: 98.0 F, Pulse: 78 bpm, Respiratory Rate: 18 breaths/min, Blood Pressure: 93/54 mmHg. Eyes Nonicteric. Reactive to light. Ears, Nose, Mouth, and Throat Lips, teeth, and gums WNL.Marland Kitchen Moist mucosa without lesions. Neck supple and nontender. No palpable supraclavicular or cervical adenopathy. Normal sized without goiter. Respiratory WNL. No retractions.. Cardiovascular Pedal Pulses WNL. No clubbing, cyanosis or  edema. Lymphatic No adneopathy. No adenopathy. No adenopathy. Musculoskeletal Adexa without tenderness or enlargement.. Digits and nails w/o clubbing, cyanosis, infection, petechiae, ischemia, or inflammatory conditions.Marland Kitchen Psychiatric Judgement and insight Intact.. No evidence of depression, anxiety, or agitation.. General Notes: the wound on the right lateral calcaneum is fairly clean and I washed it out well with saline and removed some debris and he has had the next Apligraf applied and a bolster placed and Steri-Stripped in place. The remainder of the Apligraf was applied to the left heel medial calcaneal area. Integumentary (Hair, Skin) No suspicious lesions. No crepitus or fluctuance. No peri-wound warmth or erythema. No masses.. Wound #1 status is Open. Original cause of wound was Pressure Injury. The wound is located on the Left Calcaneus. The wound measures 1.3cm length x 0.4cm width x 0.2cm depth; 0.408cm^2 area and 0.082cm^3 volume. The wound is limited to skin breakdown. There is no tunneling or undermining noted. There is a large amount of serosanguineous drainage noted. The  wound margin is thickened. There is large (67-100%) red granulation within the wound bed. There is a small (1-33%) amount of necrotic tissue within the wound bed including Eschar and Adherent Slough. The periwound skin appearance exhibited: Maceration, Moist. Periwound temperature was noted as No Abnormality. The periwound has tenderness on palpation. Wound #2 status is Open. Original cause of wound was Pressure Injury. The wound is located on the [name], [name] ([id number]) Right,Lateral Calcaneus. The wound measures 1.1cm length x 3.2cm width x 0.3cm depth; 2.765cm^2 area and 0.829cm^3 volume. There is bone, muscle, and tendon exposed. There is no tunneling or undermining noted. There is a large amount of serosanguineous drainage noted. The wound margin is thickened. There is large (67-100%) red  granulation within the wound bed. There is a small (1-33%) amount of necrotic tissue within the wound bed including Adherent Slough. The periwound skin appearance exhibited: Localized Edema, Maceration, Moist, Erythema. The surrounding wound skin color is noted with erythema which is circumferential. Periwound temperature was noted as No Abnormality. The periwound has tenderness on palpation. Assessment Active Problems ICD-10 E11.621 - Type 2 diabetes mellitus with foot ulcer E11.42 - Type 2 diabetes mellitus with diabetic polyneuropathy L89.614 - Pressure ulcer of right heel, stage 4 L89.623 - Pressure ulcer of left heel, stage 3 I35.0 - Nonrheumatic aortic (valve) stenosis After placing the last Apligraf I have discussed off loading and for a nurse visit next week. I understand his diabetes is under good control and he has been very compliant with offloading. Procedures Wound #2 Wound #2 is a Pressure Ulcer located on the Right,Lateral Calcaneus. A skin graft procedure using a bioengineered skin substitute/cellular or tissue based product was performed by [name] Kanner, MD. Apligraf was applied and secured with Steri-Strips. 4 sq cm of product was utilized and 40 sq cm was wasted due to wound size. Post Application, mepitel one was applied. A Time Out was conducted prior to the start of the procedure. The procedure was tolerated well with a pain level of 0 throughout and a pain level of 0 following the procedure. Post procedure Diagnosis Wound #2: Same as Pre-Procedure . [name], [name] ([id number]) Plan Wound Cleansing: Wound #1 Left Calcaneus: Clean wound with Normal Saline. - FOR CLINIC USE Wound #2 Right,Lateral Calcaneus: Clean wound with Normal Saline. - FOR CLINIC USE Anesthetic: Wound #1 Left Calcaneus: Topical Lidocaine 4% cream applied to wound bed prior to debridement - for clinic use only Wound #2 Right,Lateral Calcaneus: Topical Lidocaine 4% cream applied to wound  bed prior to debridement - for clinic use only Skin Barriers/Peri-Wound Care: Wound #1 Left Calcaneus: Barrier cream - FOR CLINIC USE Wound #2 Right,Lateral Calcaneus: Barrier cream - FOR CLINIC USE Primary Wound Dressing: Wound #1 Left Calcaneus: Other: - COLLAGEN DRESSING Wound #2 Right,Lateral Calcaneus: Other: - APLIGRAF Secondary Dressing: Wound #1 Left Calcaneus: Dry Gauze XtraSorb - CHARCOAL, HEEL CUP, Mepitel, steri-strips Wound #2 Right,Lateral Calcaneus: Dry Gauze XtraSorb - CHARCOAL, HEEL CUP, Mepitel, steri-strips Dressing Change Frequency: Wound #1 Left Calcaneus: Change dressing every week - AT CLINIC ****MAY CHANGE ONLY THE KERLIX AND COBAN WRAP****NOTHING ELSE IF DRAINAGE COMES THROUGH AND ADD A ABD PAD TO EACH HEEL***** Wound #2 Right,Lateral Calcaneus: Change dressing every week - AT CLINIC ****MAY CHANGE ONLY THE KERLIX AND COBAN WRAP****NOTHING ELSE IF DRAINAGE COMES THROUGH AND ADD A ABD PAD TO EACH HEEL***** Follow-up Appointments: Wound #1 Left Calcaneus: Return Appointment in 1 week. - for nurse visit Wound #2 Right,Lateral Calcaneus: Return Appointment in 1  week. - for nurse visit Edema Control: Wound #1 Left Calcaneus: 2 Layer Lite Compression System - Bilateral - kerlix and coban Wound #2 Right,Lateral Calcaneus: 2 Layer Lite Compression System - Bilateral - kerlix and coban Off-Loading: Urschel, Tarren L. ([id number]) Wound #1 Left Calcaneus: Heel suspension boot to: - please float the heels when pt is sitting with feet up or when pt is lying in bed. Turn and reposition every 2 hours Wound #2 Right,Lateral Calcaneus: Heel suspension boot to: - please float the heels when pt is sitting with feet up or when pt is lying in bed. Turn and reposition every 2 hours Additional Orders / Instructions: Wound #1 Left Calcaneus: Increase protein intake. Other: - Physical Therapy - patient is not to walk. Patient may stand for transfers and stand for  therapy but no walking distances Wound #2 Right,Lateral Calcaneus: Increase protein intake. Other: - Physical Therapy - patient is not to walk. Patient may stand for transfers and stand for therapy but no walking distances Medications-please add to medication list.: Wound #1 Left Calcaneus: Other: - Vitamin C, Zinc, Multivitamin Wound #2 Right,Lateral Calcaneus: Other: - Vitamin C, Zinc, Multivitamin After placing the last Apligraf I have discussed off loading and for a nurse visit next week. I understand his diabetes is under good control and he has been very compliant with offloading. Electronic Signature(s) Signed: 07/23/2016 7:46:12 AM By: [name] Kanner MD, FACS Previous Signature: 07/23/2016 7:46:02 AM Version By: [name] Kanner MD, FACS Previous Signature: 07/21/2016 9:51:16 AM Version By: [name] Kanner MD, FACS Entered By: [name] Kanner on 07/23/2016 07:46:12 Eaddy, Rohin Elbert [name] ([id number]) -------------------------------------------------------------------------------- SuperBill Details Patient Name: Kleve, Drayden L. Date of Service: 07/21/2016 Medical Record Number: [id number] Patient Account Number: [id number] Date of Birth/Sex: [birth year] (70 y.o. 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, stage 4 L89.623 Pressure ulcer of left heel, stage 3 I35.0 Nonrheumatic aortic (valve) stenosis Facility Procedures CPT4 Code: [id number] Description: (Facility Use Only) Apligraf 1 SQ CM Modifier: Quantity: 44 CPT4 Code: 37106269 Description: 15271 - SKIN SUB GRAFT TRNK/ARM/LEG ICD-10 Description Diagnosis E11.621 Type 2 diabetes mellitus with foot  ulcer E11.42 Type 2 diabetes mellitus with diabetic polyneurop 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

## 2016-07-21 NOTE — Progress Notes (Signed)
Patient ID: [name], male   DOB: 24-Apr-1946, 70 y.o.   MRN: [id number]    Nursing Home Location:  [facility]   Place of Service: SNF (31)  PCP: Marga Melnick, MD  Allergies  Allergen Reactions  . Oxycodone Other (See Comments)    Sedation;change in mental status  . Penicillins Hives    Chief Complaint  Patient presents with  . Medical Management of Chronic Issues    Routine Visit    HPI:  Patient is a 70 y.o. male seen today at [facility] for routine follow up. Pt with a pmh of history of hypertension,diabetes, hypothyroidism, CKD, bilateral hip fractures s/p repair and depression. Pt with ongoing follow up with wound center due to nonhealing bilateral heel ulcers. Dressing changes being done at wound center weekly. recs include off loading and edema control. Also to increase protein and supplements.  Pt reports worsening mood and outburst. Says hes been through a lot with the war and has PTSD. Psych services following him in facility but he has also made an appt with a therapist.  Blood sugars remain high. Eating well. No hypoglycemic epsidoes.  Reports up to 12 BMs a day on lactulose.  No pain at this time.    Review of Systems:  Review of Systems  Constitutional: Positive for fatigue. Negative for activity change, appetite change and unexpected weight change.  HENT: Negative for congestion, hearing loss and sore throat.   Eyes: Negative.   Respiratory: Negative for cough and shortness of breath.   Cardiovascular: Negative for chest pain, palpitations and leg swelling.  Gastrointestinal: Negative for abdominal pain, constipation and diarrhea.  Genitourinary: Negative for difficulty urinating and urgency.  Musculoskeletal: Negative for arthralgias and myalgias.  Skin: Positive for wound. Negative for color change, pallor and rash.  Neurological: Positive for tremors and weakness. Negative for dizziness, light-headedness and headaches.    Psychiatric/Behavioral: Positive for agitation, behavioral problems, confusion and sleep disturbance. The patient is not nervous/anxious.     Past Medical History:  Diagnosis Date  . Anemia, unspecified   . Anxiety   . Arthropathy, unspecified, site unspecified   . Chest pain   . Depression   . DMII (diabetes mellitus, type 2) (HCC)   . GERD (gastroesophageal reflux disease)   . History of colon polyps   . Hyperglycemia   . Hyperlipidemia   . Hypertension   . Hyponatremia   . IBS (irritable bowel syndrome)   . Liver problem   . Unspecified gastritis and gastroduodenitis without mention of hemorrhage    Past Surgical History:  Procedure Laterality Date  . ESOPHAGOGASTRODUODENOSCOPY N/A 04/21/2015   Procedure: ESOPHAGOGASTRODUODENOSCOPY (EGD);  Surgeon: Willis Modena, MD;  Location: [facility] ENDOSCOPY;  Service: Endoscopy;  Laterality: N/A;  . FEMUR IM NAIL Right 03/24/2015   Procedure: CLOSED REDUCTION INTERNAL FIXATION;  Surgeon: Kerrin Champagne, MD;  Location: [facility] OR;  Service: Orthopedics;  Laterality: Right;  . FRACTURE SURGERY    . GIVENS CAPSULE STUDY N/A 04/21/2015   Procedure: GIVENS CAPSULE STUDY;  Surgeon: Willis Modena, MD;  Location: [facility] ENDOSCOPY;  Service: Endoscopy;  Laterality: N/A;  . HERNIA REPAIR  1964  . INTRAMEDULLARY (IM) NAIL INTERTROCHANTERIC Left 01/07/2015   Procedure: INTRAMEDULLARY (IM) NAIL INTERTROCHANTRIC;  Surgeon: Kathryne Hitch, MD;  Location: [facility] ORS;  Service: Orthopedics;  Laterality: Left;   Social History:   reports that he has quit smoking. His smoking use included Cigarettes. He smoked 1.00 pack per day. He has never used  smokeless tobacco. He reports that he does not drink alcohol or use drugs.  Family History  Problem Relation Age of Onset  . Stroke Mother   . Heart attack Father   . Heart disease Father   . Cancer Other     FH of Breast Cancer-other Relative  . Heart disease Other     Parent  . Hypertension Other     parent  .  Cancer Maternal Uncle     colon    Medications: Patient's Medications  New Prescriptions   No medications on file  Previous Medications   AMBULATORY NON FORMULARY MEDICATION    Give 120 cc of NSA MedPass three time daily.   AMINO ACIDS-PROTEIN HYDROLYS (FEEDING SUPPLEMENT, PRO-STAT SUGAR FREE 64,) LIQD    Take 30 mLs by mouth daily.   FUROSEMIDE (LASIX) 20 MG TABLET    Take 20 mg by mouth.   HYDROMORPHONE (DILAUDID) 2 MG TABLET    Take 0.5 tablets (1 mg total) by mouth every 6 (six) hours as needed for severe pain (Hold for sedation/respiratory distress).   HYDROXYZINE (ATARAX/VISTARIL) 25 MG TABLET    Take 25 mg by mouth every 8 (eight) hours as needed for itching.    INSULIN ASPART (NOVOLOG) 100 UNIT/ML INJECTION    Inject 5 Units into the skin 3 (three) times daily before meals. Use sliding scale based on CBG.   INSULIN DETEMIR (LEVEMIR FLEXPEN) 100 UNIT/ML PEN    Inject 18 Units into the skin at bedtime.    LACTULOSE (CHRONULAC) 10 GM/15ML SOLUTION    Give 80 ml by mouth twice daily.   LAMOTRIGINE (LAMICTAL) 100 MG TABLET    Take 100 mg by mouth every morning.   LAMOTRIGINE (LAMICTAL) 25 MG TABLET    Take 50 mg by mouth at bedtime.   LEVOTHYROXINE (SYNTHROID, LEVOTHROID) 25 MCG TABLET    Take 25 mcg by mouth daily.   PHENYLEPHRINE-SHARK LIVER OIL-MINERAL OIL-PETROLATUM (PREPARATION H) 0.25-3-14-71.9 % RECTAL OINTMENT    Place 1 application rectally 2 (two) times daily as needed for hemorrhoids.   QUETIAPINE (SEROQUEL) 50 MG TABLET    Take 50 mg by mouth at bedtime.   RIFAXIMIN (XIFAXAN) 550 MG TABS TABLET    Take 550 mg by mouth 2 (two) times daily.   SHARK LIVER OIL-COCOA BUTTER (PREPARATION H) 0.25-3-85.5 % SUPPOSITORY    Place 1 suppository rectally every 6 (six) hours as needed for hemorrhoids.   SPIRONOLACTONE (ALDACTONE) 50 MG TABLET    Take 50 mg by mouth 2 (two) times daily.   TAMSULOSIN (FLOMAX) 0.4 MG CAPS CAPSULE    Take 0.4 mg by mouth daily. Must be given 30 minutes after  same meal daily.   VITAMIN C (ASCORBIC ACID) 500 MG TABLET    Take 500 mg by mouth daily.   ZINC SULFATE 220 (50 ZN) MG CAPSULE    Take 220 mg by mouth daily.  Modified Medications   No medications on file  Discontinued Medications   AMBULATORY NON FORMULARY MEDICATION    Magic cup by mouth daily.   LACTULOSE (CHRONULAC) 10 GM/15ML SOLUTION    Take 40 g by mouth every 6 (six) hours.   LAMOTRIGINE (LAMICTAL) 25 MG TABLET    Take 50 mg by mouth at bedtime.     Physical Exam: Vitals:   07/21/16 1451  BP: 100/64  Pulse: 71  Resp: 18  Temp: 97.6 F (36.4 C)  TempSrc: Oral  Weight: 153 lb 6.4 oz (69.6 kg)  Height:  5\' 6"  (1.676 m)    Physical Exam  Constitutional: He appears well-developed. No distress.  HENT:  Head: Normocephalic and atraumatic. Head is without contusion and without laceration.  Mouth/Throat: No oropharyngeal exudate.  Eyes: Conjunctivae and EOM are normal. Pupils are equal, round, and reactive to light.  Neck: Normal range of motion. Neck supple.  Cardiovascular: Normal rate, regular rhythm and normal heart sounds.   Pulmonary/Chest: Effort normal and breath sounds normal.  Abdominal: Soft. Bowel sounds are normal. He exhibits no distension. There is no tenderness.  Musculoskeletal: He exhibits no edema or tenderness.  Neurological: He is alert. He has normal strength.  Skin: Skin is warm and dry. He is not diaphoretic.  unstageable pressure ulcer to left heel and right great toe.   Psychiatric: He has a normal mood and affect.    Labs reviewed: Basic Metabolic Panel:  Recent Labs  16/09/9601/25/17 04/14/16 05/26/16  NA 131* 138 138  K 4.8 4.6 3.5  BUN 25* 34* 22*  CREATININE 1.2 1.3 0.8   Liver Function Tests:  Recent Labs  04/14/16  AST 24  ALT 17  ALKPHOS 103   No results for input(s): LIPASE, AMYLASE in the last 8760 hours. No results for input(s): AMMONIA in the last 8760 hours. CBC:  Recent Labs  04/14/16 05/26/16 07/01/16  WBC 2.4 1.7 1.5    HGB 10.0* 9.3* 9.0*  HCT 29* 28* 29*  PLT 59* 84* 55*   TSH:  Recent Labs  07/01/16 07/12/16  TSH 2.63 2.94   A1C: Lab Results  Component Value Date   HGBA1C 8.9 05/26/2016   Lipid Panel: No results for input(s): CHOL, HDL, LDLCALC, TRIG, CHOLHDL, LDLDIRECT in the last 8760 hours.  07/12/16- ammonia level- 87  Assessment/Plan 1. Alcoholic cirrhosis of liver without ascites (HCC) Mental status at baseline, reports BM up to 12 times daily. Ammonia level at 87. conts on lactulose and xifaxan   2. Uncontrolled type 2 diabetes mellitus with diabetic neuropathy, with long-term current use of insulin (HCC) Cont with elevated blood sugars, will add tradjenta to current regimen for better control.   3. Hypothyroidism due to acquired atrophy of thyroid TSH at goal, cont synthroid 25 mcg   4. CKD (chronic kidney disease) stage 3, GFR 30-59 ml/min BUn and Cr stable, to avoid dehydration and nephrotoxic drugs  5. Edema of both legs with pressure ulcers Following with wound care and wound care center. With compression wraps and dressing intact   6. Psychosis, unspecified psychosis type Pt with ongoing outburst and behavioral issues, has appt made with [facility] for psychotherapy, facility may transport him to his appt.   7. BPH (benign prostatic hyperplasia) Stable on flomax     Ardon Franklin K. Biagio BorgEubanks, AGNP  [facility] [phone](Monday-Friday 8 am - 5 pm) [phone] (after hours)

## 2016-07-22 LAB — CBC AND DIFFERENTIAL
HCT: 26 % — AB (ref 41–53)
Hemoglobin: 8.3 g/dL — AB (ref 13.5–17.5)
Platelets: 60 10*3/uL — AB (ref 150–399)
WBC: 1.6 10*3/mL

## 2016-07-23 LAB — MICROALBUMIN, URINE: Microalb, Ur: 0.1

## 2016-07-23 NOTE — Progress Notes (Signed)
ROALD, LUKACS ([id number]) Visit Report for 07/21/2016 Arrival Information Details Patient Name: [name], [name] Date of Service: 07/21/2016 9:15 AM Medical Record Number: [id number] Patient Account Number: [id number] Date of Birth/Sex: 1946/05/15 (70 y.o. Male) Treating RN: Curtis Sites Primary Care Physician: Montez Morita, MONICA Other Clinician: Referring Physician: Montez Morita, MONICA Treating Physician/Extender: Rudene Re in Treatment: 12 Visit Information History Since Last Visit Added or deleted any medications: No Patient Arrived: Wheel Chair Any new allergies or adverse reactions: No Arrival Time: 09:13 Had a fall or experienced change in No Accompanied By: friend activities of daily living that may affect Transfer Assistance: Manual risk of falls: Patient Identification Verified: Yes Signs or symptoms of abuse/neglect since last No Secondary Verification Process Yes visito Completed: Hospitalized since last visit: No Patient Requires Transmission- No Pain Present Now: No Based Precautions: Patient Has Alerts: Yes Patient Alerts: DM II bil legs non- compressible Electronic Signature(s) Signed: 07/21/2016 11:29:23 AM By: Elpidio Eric BSN, RN Entered By: Elpidio Eric on 07/21/2016 11:29:23 Shimabukuro, [name] ([id number]) -------------------------------------------------------------------------------- Encounter Discharge Information Details Patient Name: [name], [name] Date of Service: 07/21/2016 9:15 AM Medical Record Number: [id number] Patient Account Number: [id number] Date of Birth/Sex: 01/11/1946 (70 y.o. Male) Treating RN: Curtis Sites Primary Care Physician: Montez Morita, MONICA Other Clinician: Referring Physician: Montez Morita, MONICA Treating Physician/Extender: Rudene Re in Treatment: 12 Encounter Discharge Information Items Discharge Pain Level: 0 Discharge Condition: Stable Ambulatory Status: Wheelchair Discharge Destination: Home Transportation:  Private Auto Accompanied By: friend Schedule Follow-up Appointment: Yes Medication Reconciliation completed No and provided to Patient/Care [name]: Provided on Clinical Summary of Care: 07/21/2016 Form Type Recipient Paper Patient [facility] Electronic Signature(s) Signed: 07/21/2016 10:06:54 AM By: Gwenlyn Perking Entered By: Gwenlyn Perking on 07/21/2016 10:06:54 Coop, Corinthian L. ([id number]) -------------------------------------------------------------------------------- Lower Extremity Assessment Details Patient Name: [name], [name] Date of Service: 07/21/2016 9:15 AM Medical Record Number: [id number] Patient Account Number: [id number] Date of Birth/Sex: November 03, 1946 (70 y.o. Male) Treating RN: Curtis Sites Primary Care Physician: Montez Morita, MONICA Other Clinician: Referring Physician: Montez Morita, MONICA Treating Physician/Extender: Rudene Re in Treatment: 12 Vascular Assessment Pulses: Posterior Tibial Dorsalis Pedis Palpable: [Left:Yes] [Right:Yes] Extremity colors, hair growth, and conditions: Extremity Color: [Left:Normal] [Right:Normal] Hair Growth on Extremity: [Left:No] [Right:No] Temperature of Extremity: [Left:Warm] [Right:Warm] Capillary Refill: [Left:< 3 seconds] [Right:< 3 seconds] Electronic Signature(s) Signed: 07/21/2016 3:42:15 PM By: Curtis Sites Signed: 07/22/2016 4:59:27 PM By: Elpidio Eric BSN, RN Entered By: Elpidio Eric on 07/21/2016 11:30:00 Stahlman, [name] ([id number]) -------------------------------------------------------------------------------- Multi Wound Chart Details Patient Name: [name], [name] Date of Service: 07/21/2016 9:15 AM Medical Record Number: [id number] Patient Account Number: [id number] Date of Birth/Sex: 04-26-1946 (70 y.o. Male) Treating RN: Curtis Sites Primary Care Physician: Montez Morita, MONICA Other Clinician: Referring Physician: Montez Morita, MONICA Treating Physician/Extender: Rudene Re in Treatment: 12 Vital  Signs Height(in): 65 Pulse(bpm): 78 Weight(lbs): Blood Pressure 93/54 (mmHg): Body Mass Index(BMI): Temperature(F): 98.0 Respiratory Rate 18 (breaths/min): Photos: [N/A:N/A] Wound Location: Left Calcaneus Right Calcaneus - Lateral N/A Wounding Event: Pressure Injury Pressure Injury N/A Primary Etiology: Pressure Ulcer Pressure Ulcer N/A Secondary Etiology: Diabetic Wound/Ulcer of Diabetic Wound/Ulcer of N/A the Lower Extremity the Lower Extremity Comorbid History: Anemia, Hypertension, Anemia, Hypertension, N/A Type II Diabetes Type II Diabetes Date Acquired: 12/11/2015 12/11/2015 N/A Weeks of Treatment: 12 12 N/A Wound Status: Open Open N/A Measurements L x W x D 1.3x0.4x0.2 1.1x3.2x0.3 N/A (cm) Area (cm) : 0.408 2.765 N/A Volume (cm) : 0.082 0.829 N/A % Reduction in Area: 93.20% 68.90% N/A % Reduction in Volume:  93.10% 81.30% N/A Classification: Category/Stage II Category/Stage IV N/A HBO Classification: Grade 1 Grade 1 N/A Exudate Amount: Large Large N/A Exudate Type: Serosanguineous Serosanguineous N/A Exudate Color: red, brown red, brown N/A Foul Odor After No Yes N/A Cleansing: [name], [name] ([id number]) Odor Anticipated Due to N/A No N/A Product Use: Wound Margin: Thickened Thickened N/A Granulation Amount: Large (67-100%) Large (67-100%) N/A Granulation Quality: Red Red N/A Necrotic Amount: Small (1-33%) Small (1-33%) N/A Necrotic Tissue: Eschar, Adherent Slough Adherent Slough N/A Exposed Structures: Fascia: No Tendon: Yes N/A Fat: No Muscle: Yes Tendon: No Bone: Yes Muscle: No Fascia: No Joint: No Fat: No Bone: No Joint: No Limited to Skin Breakdown Epithelialization: None None N/A Periwound Skin Texture: No Abnormalities Noted Edema: Yes N/A Periwound Skin Maceration: Yes Maceration: Yes N/A Moisture: Moist: Yes Moist: Yes Periwound Skin Color: No Abnormalities Noted Erythema: Yes N/A Erythema Location: N/A Circumferential  N/A Temperature: No Abnormality No Abnormality N/A Tenderness on Yes Yes N/A Palpation: Wound Preparation: Ulcer Cleansing: Ulcer Cleansing: N/A Rinsed/Irrigated with Rinsed/Irrigated with Saline Saline Topical Anesthetic Topical Anesthetic Applied: Other: lidocaine Applied: Other: lidocaine 4% 4% Treatment Notes Electronic Signature(s) Signed: 07/21/2016 3:42:15 PM By: Curtis Sites Entered By: Curtis Sites on 07/21/2016 09:33:31 [name], [name] ([id number]) -------------------------------------------------------------------------------- Multi-Disciplinary Care Plan Details Patient Name: Menges, Telvin L. Date of Service: 07/21/2016 9:15 AM Medical Record Number: [id number] Patient Account Number: [id number] Date of Birth/Sex: [birth year] (70 y.o. Male) Treating RN: Curtis Sites Primary Care Physician: Montez Morita, MONICA Other Clinician: Referring Physician: Montez Morita, MONICA Treating Physician/Extender: Rudene Re in Treatment: 12 Active Inactive Abuse / Safety / Falls / Self Care Management Nursing Diagnoses: Potential for falls Goals: Patient will remain injury free Date Initiated: 04/27/2016 Goal Status: Active Interventions: Assess fall risk on admission and as needed Assess self care needs on admission and as needed Notes: Nutrition Nursing Diagnoses: Imbalanced nutrition Goals: Patient/caregiver agrees to and verbalizes understanding of need to use nutritional supplements and/or vitamins as prescribed Date Initiated: 04/27/2016 Goal Status: Active Interventions: Assess patient nutrition upon admission and as needed per policy Notes: Orientation to the Wound Care Program Nursing Diagnoses: Knowledge deficit related to the wound healing center program Goals: Headlee, Alireza Elbert Ewings ([id number]) Patient/caregiver will verbalize understanding of the Wound Healing Center Program Date Initiated: 04/27/2016 Goal Status: Active Interventions: Provide education on  orientation to the wound center Notes: Pain, Acute or Chronic Nursing Diagnoses: Pain, acute or chronic: actual or potential Potential alteration in comfort, pain Goals: Patient will verbalize adequate pain control and receive pain control interventions during procedures as needed Date Initiated: 04/27/2016 Goal Status: Active Patient/caregiver will verbalize adequate pain control between visits Date Initiated: 04/27/2016 Goal Status: Active Interventions: Assess comfort goal upon admission Complete pain assessment as per visit requirements Notes: Pressure Nursing Diagnoses: Knowledge deficit related to causes and risk factors for pressure ulcer development Knowledge deficit related to management of pressures ulcers Goals: Patient will remain free from development of additional pressure ulcers Date Initiated: 04/27/2016 Goal Status: Active Interventions: Assess: immobility, friction, shearing, incontinence upon admission and as needed Assess offloading mechanisms upon admission and as needed Assess potential for pressure ulcer upon admission and as needed Notes: [name], [name] ([id number]) Wound/Skin Impairment Nursing Diagnoses: Impaired tissue integrity Goals: Ulcer/skin breakdown will have a volume reduction of 30% by week 4 Date Initiated: 04/27/2016 Goal Status: Active Ulcer/skin breakdown will have a volume reduction of 50% by week 8 Date Initiated: 04/27/2016 Goal Status: Active Ulcer/skin breakdown will have a volume reduction of 80%  by week 12 Date Initiated: 04/27/2016 Goal Status: Active Interventions: Assess patient/caregiver ability to perform ulcer/skin care regimen upon admission and as needed Assess ulceration(s) every visit Notes: Electronic Signature(s) Signed: 07/21/2016 3:42:15 PM By: Curtis Sites Signed: 07/22/2016 4:59:27 PM By: Elpidio Eric BSN, RN Entered By: Elpidio Eric on 07/21/2016 11:30:19 [name], [name]  ([id number]) -------------------------------------------------------------------------------- Pain Assessment Details Patient Name: Cieslik, Marcus L. Date of Service: 07/21/2016 9:15 AM Medical Record Number: [id number] Patient Account Number: [id number] Date of Birth/Sex: 1946/02/24 (70 y.o. Male) Treating RN: Curtis Sites Primary Care Physician: Montez Morita, MONICA Other Clinician: Referring Physician: Montez Morita, MONICA Treating Physician/Extender: Rudene Re in Treatment: 12 Active Problems Location of Pain Severity and Description of Pain Patient Has Paino No Site Locations Pain Management and Medication Current Pain Management: Notes Topical or injectable lidocaine is offered to patient for acute pain when surgical debridement is performed. If needed, Patient is instructed to use over the counter pain medication for the following 24-48 hours after debridement. Wound care MDs do not prescribed pain medications. Patient has chronic pain or uncontrolled pain. Patient has been instructed to make an appointment with their Primary Care Physician for pain management. Electronic Signature(s) Signed: 07/21/2016 11:29:35 AM By: Elpidio Eric BSN, RN Signed: 07/21/2016 3:42:15 PM By: Curtis Sites Entered By: Elpidio Eric on 07/21/2016 11:29:34 Segall, [name] ([id number]) -------------------------------------------------------------------------------- Patient/Caregiver Education Details Patient Name: [name], [name] Date of Service: 07/21/2016 9:15 AM Medical Record Number: [id number] Patient Account Number: [id number] Date of Birth/Gender: 1946/06/09 (70 y.o. Male) Treating RN: Curtis Sites Primary Care Physician: Montez Morita, MONICA Other Clinician: Referring Physician: Montez Morita, MONICA Treating Physician/Extender: Rudene Re in Treatment: 12 Education Assessment Education Provided To: Patient and Caregiver Education Topics Provided Wound/Skin Impairment: Handouts: Other: keep  dressing intact Methods: Explain/Verbal Responses: State content correctly Electronic Signature(s) Signed: 07/21/2016 3:42:15 PM By: Curtis Sites Entered By: Curtis Sites on 07/21/2016 10:03:16 [name], [name] ([id number]) -------------------------------------------------------------------------------- Wound Assessment Details Patient Name: [name], [name] Date of Service: 07/21/2016 9:15 AM Medical Record Number: [id number] Patient Account Number: [id number] Date of Birth/Sex: 11-Oct-1946 (71 y.o. Male) Treating RN: Curtis Sites Primary Care Physician: Montez Morita, MONICA Other Clinician: Referring Physician: Montez Morita, MONICA Treating Physician/Extender: Maxwell Caul Weeks in Treatment: 12 Wound Status Wound Number: 1 Primary Etiology: Pressure Ulcer Wound Location: Left Calcaneus Secondary Diabetic Wound/Ulcer of the Lower Etiology: Extremity Wounding Event: Pressure Injury Wound Status: Open Date Acquired: 12/11/2015 Comorbid Anemia, Hypertension, Type II Weeks Of Treatment: 12 History: Diabetes Clustered Wound: No Photos Wound Measurements Length: (cm) 1.3 Width: (cm) 0.4 Depth: (cm) 0.2 Area: (cm) 0.408 Volume: (cm) 0.082 % Reduction in Area: 93.2% % Reduction in Volume: 93.1% Epithelialization: None Tunneling: No Undermining: No Wound Description Classification: Category/Stage II Diabetic Severity [address]): Grade 1 Wound Margin: Thickened Exudate Amount: Large Exudate Type: Serosanguineous Exudate Color: red, brown Foul Odor After Cleansing: No Wound Bed Granulation Amount: Large (67-100%) Exposed Structure Granulation Quality: Red Fascia Exposed: No Necrotic Amount: Small (1-33%) Fat Layer Exposed: No Necrotic Quality: Eschar, Adherent Slough Tendon Exposed: No [name], [name] ([id number]) Muscle Exposed: No Joint Exposed: No Bone Exposed: No Limited to Skin Breakdown Periwound Skin Texture Texture Color No Abnormalities Noted: No No  Abnormalities Noted: No Moisture Temperature / Pain No Abnormalities Noted: No Temperature: No Abnormality Maceration: Yes Tenderness on Palpation: Yes Moist: Yes Wound Preparation Ulcer Cleansing: Rinsed/Irrigated with Saline Topical Anesthetic Applied: Other: lidocaine 4%, Treatment Notes Wound #1 (Left Calcaneus) 1. Cleansed with: Cleanse wound with antibacterial soap and water 4. Dressing  Applied: Other dressing (specify in notes) 5. Secondary Dressing Applied ABD Pad Dry Gauze 7. Secured with Tape Notes apligraf on right heel, xtrasorb, kerlix, coban Electronic Signature(s) Signed: 07/21/2016 3:42:15 PM By: Curtis Sitesorthy, Joanna Entered By: Curtis Sitesorthy, Joanna on 07/21/2016 09:28:07 Mccallum, Jacobb L. ([id number]) -------------------------------------------------------------------------------- Wound Assessment Details Patient Name: Isidro, Revis L. Date of Service: 07/21/2016 9:15 AM Medical Record Number: [id number] Patient Account Number: [id number] Date of Birth/Sex: August 23, 1946 [age] y.o. Male) Treating RN: Curtis Sitesorthy, Joanna Primary Care Physician: Montez MoritaARTER, MONICA Other Clinician: Referring Physician: Montez MoritaARTER, MONICA Treating Physician/Extender: Maxwell CaulOBSON, Job G Weeks in Treatment: 12 Wound Status Wound Number: 2 Primary Etiology: Pressure Ulcer Wound Location: Right Calcaneus - Lateral Secondary Diabetic Wound/Ulcer of the Lower Etiology: Extremity Wounding Event: Pressure Injury Wound Status: Open Date Acquired: 12/11/2015 Comorbid Anemia, Hypertension, Type II Weeks Of Treatment: 12 History: Diabetes Clustered Wound: No Photos Wound Measurements Length: (cm) 1.1 Width: (cm) 3.2 Depth: (cm) 0.3 Area: (cm) 2.765 Volume: (cm) 0.829 % Reduction in Area: 68.9% % Reduction in Volume: 81.3% Epithelialization: None Tunneling: No Undermining: No Wound Description Classification: Category/Stage IV Foul Odor A Diabetic Severity ([place]): Grade 1 Due to Prod Wound Margin:  Thickened Exudate Amount: Large Exudate Type: Serosanguineous Exudate Color: red, brown fter Cleansing: Yes uct Use: No Wound Bed Granulation Amount: Large (67-100%) Exposed Structure Granulation Quality: Red Fascia Exposed: No Necrotic Amount: Small (1-33%) Fat Layer Exposed: No Necrotic Quality: Adherent Slough Tendon Exposed: Yes Tugman, Faisal L. ([id number]) Muscle Exposed: Yes Necrosis of Muscle: No Joint Exposed: No Bone Exposed: Yes Periwound Skin Texture Texture Color No Abnormalities Noted: No No Abnormalities Noted: No Localized Edema: Yes Erythema: Yes Erythema Location: Circumferential Moisture No Abnormalities Noted: No Temperature / Pain Maceration: Yes Temperature: No Abnormality Moist: Yes Tenderness on Palpation: Yes Wound Preparation Ulcer Cleansing: Rinsed/Irrigated with Saline Topical Anesthetic Applied: Other: lidocaine 4%, Treatment Notes Wound #2 (Right, Lateral Calcaneus) 1. Cleansed with: Cleanse wound with antibacterial soap and water 4. Dressing Applied: Other dressing (specify in notes) 5. Secondary Dressing Applied ABD Pad Dry Gauze 7. Secured with Tape Notes apligraf on right heel, xtrasorb, kerlix, coban Electronic Signature(s) Signed: 07/21/2016 3:42:15 PM By: Curtis Sitesorthy, Joanna Entered By: Curtis Sitesorthy, Joanna on 07/21/2016 09:28:37 Novacek, Kongmeng L. ([id number]) -------------------------------------------------------------------------------- Vitals Details Patient Name: Puett, Emani L. Date of Service: 07/21/2016 9:15 AM Medical Record Number: [id number] Patient Account Number: [id number] Date of Birth/Sex: August 23, 1946 [age] y.o. Male) Treating RN: Curtis Sitesorthy, Joanna Primary Care Physician: Montez MoritaARTER, MONICA Other Clinician: Referring Physician: Montez MoritaARTER, MONICA Treating Physician/Extender: Rudene ReBritto, Errol Weeks in Treatment: 12 Vital Signs Time Taken: 09:14 Temperature (F): 98.0 Height (in): 65 Pulse (bpm): 78 Respiratory Rate  (breaths/min): 18 Blood Pressure (mmHg): 93/54 Reference Range: 80 - 120 mg / dl Electronic Signature(s) Signed: 07/22/2016 4:59:27 PM By: Elpidio EricAfful, Rita BSN, RN Entered By: Elpidio EricAfful, Rita on 07/21/2016 11:29:46

## 2016-07-29 DIAGNOSIS — K7290 Hepatic failure, unspecified without coma: Secondary | ICD-10-CM | POA: Diagnosis not present

## 2016-07-29 DIAGNOSIS — L89614 Pressure ulcer of right heel, stage 4: Secondary | ICD-10-CM | POA: Diagnosis not present

## 2016-07-29 DIAGNOSIS — K766 Portal hypertension: Secondary | ICD-10-CM | POA: Diagnosis not present

## 2016-07-29 DIAGNOSIS — Z794 Long term (current) use of insulin: Secondary | ICD-10-CM | POA: Diagnosis not present

## 2016-07-29 DIAGNOSIS — D61818 Other pancytopenia: Secondary | ICD-10-CM | POA: Diagnosis not present

## 2016-07-29 DIAGNOSIS — I350 Nonrheumatic aortic (valve) stenosis: Secondary | ICD-10-CM | POA: Diagnosis not present

## 2016-07-29 DIAGNOSIS — E11621 Type 2 diabetes mellitus with foot ulcer: Secondary | ICD-10-CM | POA: Diagnosis not present

## 2016-07-29 DIAGNOSIS — K7460 Unspecified cirrhosis of liver: Secondary | ICD-10-CM | POA: Diagnosis not present

## 2016-07-29 DIAGNOSIS — L89623 Pressure ulcer of left heel, stage 3: Secondary | ICD-10-CM | POA: Diagnosis not present

## 2016-07-29 DIAGNOSIS — E1142 Type 2 diabetes mellitus with diabetic polyneuropathy: Secondary | ICD-10-CM | POA: Diagnosis not present

## 2016-07-30 NOTE — Progress Notes (Signed)
HRIDAY, STAI ([id number]) Visit Report for 07/29/2016 Arrival Information Details Patient Name: [name], [name] Date of Service: 07/29/2016 1:30 PM Medical Record Number: [id number] Patient Account Number: [id number] Date of Birth/Sex: [birth year] (70 y.o. Male) Treating RN: Curtis Sites Primary Care Physician: Marga Melnick Other Clinician: Referring Physician: Montez Morita, MONICA Treating Physician/Extender: Elayne Snare in Treatment: 13 Visit Information History Since Last Visit Added or deleted any medications: No Patient Arrived: Wheel Chair Any new allergies or adverse reactions: No Arrival Time: 13:40 Had a fall or experienced change in No Accompanied By: friend activities of daily living that may affect Transfer Assistance: Manual risk of falls: Patient Identification Verified: Yes Signs or symptoms of abuse/neglect since last No Secondary Verification Process Yes visito Completed: Hospitalized since last visit: No Patient Requires Transmission- No Pain Present Now: No Based Precautions: Patient Has Alerts: Yes Patient Alerts: DM II bil legs non- compressible Electronic SignatureAcostas) Signed: 07/29/2016 4:58:15 PM By: Curtis Sites Entered By: Curtis Sites on 07/29/2016 13:44:00 [name], [name] ([id number]) -------------------------------------------------------------------------------- Clinic Level of Care Assessment Details Patient Name: [name], [name] Date of Service: 07/29/2016 1:30 PM Medical Record Number: [id number] Patient Account Number: [id number] Date of Birth/Sex: January 08, 1946 (70 y.o. Male) Treating RN: Curtis Sites Primary Care Physician: Marga Melnick Other Clinician: Referring Physician: Montez Morita, MONICA Treating Physician/Extender: Elayne Snare in Treatment: 13 Clinic Level of Care Assessment Items TOOL 4 Quantity Score []  - Use when only an EandM is performed on FOLLOW-UP visit 0 ASSESSMENTS - Nursing Assessment /  Reassessment X - Reassessment of Co-morbidities (includes updates in patient status) 1 10 X - Reassessment of Adherence to Treatment Plan 1 5 ASSESSMENTS - Wound and Skin Assessment / Reassessment []  - Simple Wound Assessment / Reassessment - one wound 0 X - Complex Wound Assessment / Reassessment - multiple wounds 2 5 []  - Dermatologic / Skin Assessment (not related to wound area) 0 ASSESSMENTS - Focused Assessment []  - Circumferential Edema Measurements - multi extremities 0 []  - Nutritional Assessment / Counseling / Intervention 0 X - Lower Extremity Assessment (monofilament, tuning fork, pulses) 1 5 []  - Peripheral Arterial Disease Assessment (using hand held doppler) 0 ASSESSMENTS - Ostomy and/or Continence Assessment and Care []  - Incontinence Assessment and Management 0 []  - Ostomy Care Assessment and Management (repouching, etc.) 0 PROCESS - Coordination of Care X - Simple Patient / Family Education for ongoing care 1 15 []  - Complex (extensive) Patient / Family Education for ongoing care 0 []  - Staff obtains Chiropractor, Records, Test Results / Process Orders 0 []  - Staff telephones HHA, Nursing Homes / Clarify orders / etc 0 []  - Routine Transfer to another Facility (non-emergent condition) 0 Bansal, [name] ([id number]) []  - Routine Hospital Admission (non-emergent condition) 0 []  - New Admissions / Manufacturing engineer / Ordering NPWT, Apligraf, etc. 0 []  - Emergency Hospital Admission (emergent condition) 0 X - Simple Discharge Coordination 1 10 []  - Complex (extensive) Discharge Coordination 0 PROCESS - Special Needs []  - Pediatric / Minor Patient Management 0 []  - Isolation Patient Management 0 []  - Hearing / Language / Visual special needs 0 []  - Assessment of Community assistance (transportation, D/C planning, etc.) 0 []  - Additional assistance / Altered mentation 0 []  - Support SurfaceAcostas) Assessment (bed, cushion, seat, etc.) 0 INTERVENTIONS - Wound Cleansing /  Measurement X - Simple Wound Cleansing - one wound 1 5 []  - Complex Wound Cleansing - multiple wounds 0 X - Wound Imaging (photographs - any number of wounds) 1  5 []  - Wound Tracing (instead of photographs) 0 X - Simple Wound Measurement - one wound 1 5 []  - Complex Wound Measurement - multiple wounds 0 INTERVENTIONS - Wound Dressings []  - Small Wound Dressing one or multiple wounds 0 []  - Medium Wound Dressing one or multiple wounds 0 []  - Large Wound Dressing one or multiple wounds 0 []  - Application of Medications - topical 0 []  - Application of Medications - injection 0 INTERVENTIONS - Miscellaneous []  - External ear exam 0 [name], [name] ([id number]) []  - Specimen Collection (cultures, biopsies, blood, body fluids, etc.) 0 []  - SpecimenAcostas) / CultureAcostas) sent or taken to Lab for analysis 0 []  - Patient Transfer (multiple staff / Michiel SitesHoyer Lift / Similar devices) 0 []  - Simple Staple / Suture removal (25 or less) 0 []  - Complex Staple / Suture removal (26 or more) 0 []  - Hypo / Hyperglycemic Management (close monitor of Blood Glucose) 0 []  - Ankle / Brachial Index (ABI) - do not check if billed separately 0 X - Vital Signs 1 5 Has the patient been seen at the hospital within the last three years: Yes Total Score: 75 Level Of Care: New/Established - Level 2 Electronic SignatureAcostas) Signed: 07/29/2016 4:58:15 PM By: Curtis Sitesorthy, Joanna Entered By: Curtis Sitesorthy, Joanna on 07/29/2016 14:58:45 [name], [name] ([id number]) -------------------------------------------------------------------------------- Encounter Discharge Information Details Patient Name: [name], [name] Date of Service: 07/29/2016 1:30 PM Medical Record Number: [id number] Patient Account Number: [id number] Date of Birth/Sex: May 21, 1946 [name] Treating RN: Curtis Sitesorthy, Joanna Primary Care Physician: Marga MelnickHOPPER, WILLIAM Other Clinician: Referring Physician: Montez MoritaARTER, MONICA Treating Physician/Extender: Elayne SnarePARKER, PETER Weeks in  Treatment: 6413 Encounter Discharge Information Items Discharge Pain Level: 0 Discharge Condition: Stable Ambulatory Status: Wheelchair Discharge Destination: Home Private Transportation: Auto Accompanied By: friend Schedule Follow-up Appointment: Yes Medication Reconciliation completed and No provided to Patient/Care [name]: Clinical Summary of Care: Electronic SignatureAcostas) Signed: 07/29/2016 2:56:18 PM By: Curtis Sitesorthy, Joanna Entered By: Curtis Sitesorthy, Joanna on 07/29/2016 14:56:17 [name], [name] ([id number]) -------------------------------------------------------------------------------- Patient/Caregiver Education Details Patient Name: [name], [name] Date of Service: 07/29/2016 1:30 PM Medical Record Number: [id number] Patient Account Number: [id number] Date of Birth/Gender: May 21, 1946 44[name] Treating RN: Curtis Sitesorthy, Joanna Primary Care Physician: Marga MelnickHOPPER, WILLIAM Other Clinician: Referring Physician: Montez MoritaARTER, MONICA Treating Physician/Extender: Elayne SnarePARKER, PETER Weeks in Treatment: 13 Education Assessment Education Provided To: Patient Education Topics Provided Wound/Skin Impairment: Handouts: Other: keep dressing dry and intact this week Methods: Explain/Verbal Responses: State content correctly Electronic SignatureAcostas) Signed: 07/29/2016 4:58:15 PM By: Curtis Sitesorthy, Joanna Entered By: Curtis Sitesorthy, Joanna on 07/29/2016 14:55:48 Heidrick, [name] ([id number]) -------------------------------------------------------------------------------- Wound Assessment Details Patient Name: Mccrory, [name] Date of Service: 07/29/2016 1:30 PM Medical Record Number: [id number] Patient Account Number: [id number] Date of Birth/Sex: May 21, 1946 80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 Etiology: Lower Extremity Date Acquired: 12/11/2015 Wound Status: Open Weeks Of Treatment: 13 Comorbid Anemia, Hypertension, Type II Clustered Wound: No History: Diabetes Photos Wound Measurements Length: (cm) 1.3 Width: (cm) 0.4 Depth: (cm) 0.2 Area: (cm) 0.408 Volume: (cm) 0.082 % Reduction in Area: 93.2% % Reduction in Volume: 93.1% Epithelialization: None Wound Description Classification: Category/Stage II Diabetic Severity Loreta AveAcostaWagner): Grade 1 Wound Margin: Thickened Exudate Amount: Large Exudate Type: Serosanguineous Exudate Color: red, brown Foul Odor After Cleansing: No Wound Bed  Granulation Amount: Large (67-100%) Exposed Structure Granulation Quality: Red Fascia Exposed: No Necrotic Amount: Small (1-33%) Fat Layer Exposed: No Necrotic Quality: Eschar, Adherent Slough Tendon Exposed: No Birch, [name] ([id number]) Muscle Exposed: No Joint Exposed: No Bone Exposed: No Limited to Skin Breakdown Periwound Skin Texture Texture Color No Abnormalities Noted: No No Abnormalities Noted: No Moisture Temperature / Pain No Abnormalities Noted: No Temperature: No Abnormality Maceration: Yes Tenderness on Palpation: Yes Moist: Yes Wound Preparation Ulcer Cleansing: Rinsed/Irrigated with Saline Topical Anesthetic Applied: Other: lidocaine 4%, Treatment Notes Wound #1 (Left Calcaneus) 1. Cleansed with: Clean wound with Normal Saline 4. Dressing Applied: Other dressing (specify in notes) 5. Secondary Dressing Applied Guaze, ABD and kerlix/Conform 7. Secured with Tape Notes carboflex, apligraf and mepitel dressing left intact Electronic SignatureAcostas) Signed: 07/29/2016 4:58:15 PM By: Curtis Sitesorthy, Joanna Entered By: Curtis Sitesorthy, Joanna on 07/29/2016 14:25:24 Rood, Darrion L.  ([id number]) -------------------------------------------------------------------------------- Wound Assessment Details Patient Name: Winquist, [name] Date of Service: 07/29/2016 1:30 PM Medical Record Number: [id number] Patient Account Number: [id number] Date of Birth/Sex: [birth year] [name] Treating RN: Curtis Sitesorthy, Joanna Primary Care Physician: Marga MelnickHOPPER, WILLIAM Other Clinician: Referring Physician: Montez MoritaARTER, MONICA Treating Physician/Extender: Elayne SnarePARKER, PETER Weeks in Treatment: 13 Wound Status Wound Number: 2 Primary Pressure Ulcer Etiology: Wound Location: Right Calcaneus - Lateral Secondary Diabetic Wound/Ulcer of the Wounding Event: Pressure Injury Etiology: Lower Extremity Date Acquired: 12/11/2015 Wound Status: Open Weeks Of Treatment: 13 Comorbid Anemia, Hypertension, Type II Clustered Wound: No History: Diabetes Photos Wound Measurements Length: (cm) 1.1 Width: (cm) 3.2 Depth: (cm) 0.3 Area: (cm) 2.765 Volume: (cm) 0.829 % Reduction in Area: 68.9% % Reduction in Volume: 81.3% Epithelialization: None Tunneling: No Undermining: No Wound Description Classification: Category/Stage IV Foul Odor Af Diabetic Severity (Wagner): Grade 1 Due to Produ Wound Margin: Thickened Exudate Amount: Large Exudate Type: Serosanguineous Exudate Color: red, brown ter Cleansing: Yes ct Use: No Wound Bed Granulation Amount: Large (67-100%) Exposed Structure Granulation Quality: Red Fascia Exposed: No Necrotic Amount: Small (1-33%) Fat Layer Exposed: No Necrotic Quality: Adherent Slough Tendon Exposed: Yes Basso, Wilfrid L. ([id number]) Muscle Exposed: Yes Necrosis of Muscle: No Joint Exposed: No Bone Exposed: Yes Periwound Skin Texture Texture Color No Abnormalities Noted: No No Abnormalities Noted: No Localized Edema: Yes Erythema: Yes Erythema Location: Circumferential Moisture No Abnormalities Noted: No Temperature / Pain Maceration: Yes Temperature: No  Abnormality Moist: Yes Tenderness on Palpation: Yes Wound Preparation Ulcer Cleansing: Rinsed/Irrigated with Saline Topical Anesthetic Applied: Other: lidocaine 4%, Treatment Notes Wound #2 (Right, Lateral Calcaneus) 1. Cleansed with: Clean wound with Normal Saline 4. Dressing Applied: Other dressing (specify in notes) 5. Secondary Dressing Applied Guaze, ABD and kerlix/Conform 7. Secured with Tape Notes carboflex, apligraf and mepitel dressing left intact Electronic SignatureAcostas) Signed: 07/29/2016 4:58:15 PM By: Curtis Sitesorthy, Joanna Entered By: Curtis Sitesorthy, Joanna on 07/29/2016 14:25:50

## 2016-08-04 ENCOUNTER — Encounter: Payer: PRIVATE HEALTH INSURANCE | Admitting: Internal Medicine

## 2016-08-04 DIAGNOSIS — E11621 Type 2 diabetes mellitus with foot ulcer: Secondary | ICD-10-CM | POA: Diagnosis not present

## 2016-08-05 NOTE — Progress Notes (Signed)
[name], [name] ([id number]) Visit Report for 08/04/2016 Arrival Information Details Patient Name: [name], [name] Date of Service: 08/04/2016 2:15 PM Medical Record Number: [id number] Patient Account Number: [id number] Date of Birth/Sex: [birth year] [age] y.o. Male) Treating RN: Curtis Sitesorthy, Joanna Primary Care Physician: Marga MelnickHOPPER, WILLIAM Other Clinician: Referring Physician: Marga MelnickHOPPER, WILLIAM Treating Physician/Extender: Altamese CarolinaOBSON, [name]: 14 Visit Information History Since Last Visit Added or deleted any medications: No Patient Arrived: Wheel Chair Any new allergies or adverse reactions: No Arrival Time: 14:30 Had a fall or experienced change in No Accompanied By: friend activities of daily living that may affect Transfer Assistance: Manual risk of falls: Patient Identification Verified: Yes Signs or symptoms of abuse/neglect since last No Secondary Verification Process Yes visito Completed: Hospitalized since last visit: No Patient Requires Transmission- No Pain Present Now: No Based Precautions: Patient Has Alerts: Yes Patient Alerts: DM II bil legs non- compressible Electronic Signature(s) Signed: 08/04/2016 5:18:20 PM By: Curtis Sitesorthy, Joanna Entered By: Curtis Sitesorthy, Joanna on 08/04/2016 14:31:00 [name], [name] ([id number]) -------------------------------------------------------------------------------- Encounter Discharge Information Details Patient Name: [name], [name] Date of Service: 08/04/2016 2:15 PM Medical Record Number: [id number] Patient Account Number: [id number] Date of Birth/Sex: [birth year] [age] y.o. Male) Treating RN: Curtis Sitesorthy, Joanna Primary Care Physician: Marga MelnickHOPPER, WILLIAM Other Clinician: Referring Physician: Marga MelnickHOPPER, WILLIAM Treating Physician/Extender: Altamese CarolinaOBSON, [place] G Weeks in [name]: 14 Encounter Discharge Information Items Discharge Pain Level: 0 Discharge Condition: Stable Ambulatory Status: Wheelchair Discharge Destination:  Home Transportation: Private Auto Accompanied By: friend Schedule Follow-up Appointment: Yes Medication Reconciliation completed and provided to Patient/Care No Hanah Moultry: Provided on Clinical Summary of Care: 08/04/2016 Form Type Recipient Paper Patient MS Electronic Signature(s) Signed: 08/04/2016 3:54:24 PM By: Curtis Sitesorthy, Joanna Previous Signature: 08/04/2016 3:37:16 PM Version By: Gwenlyn PerkingMoore, Shelia Entered By: Curtis Sitesorthy, Joanna on 08/04/2016 15:54:23 [name], [name] ([id number]) -------------------------------------------------------------------------------- Lower Extremity Assessment Details Patient Name: [name], [name] Date of Service: 08/04/2016 2:15 PM Medical Record Number: [id number] Patient Account Number: [id number] Date of Birth/Sex: [birth year] [age] y.o. Male) Treating RN: Curtis Sitesorthy, Joanna Primary Care Physician: Marga MelnickHOPPER, WILLIAM Other Clinician: Referring Physician: Marga MelnickHOPPER, WILLIAM Treating Physician/Extender: Altamese CarolinaOBSON, [name]: 14 Vascular Assessment Pulses: Posterior Tibial Dorsalis Pedis Palpable: [Left:Yes] [Right:Yes] Extremity colors, hair growth, and conditions: Extremity Color: [Left:Normal] [Right:Normal] Hair Growth on Extremity: [Left:Yes] [Right:Yes] Temperature of Extremity: [Left:Warm] [Right:Warm] Capillary Refill: [Left:< 3 seconds] [Right:< 3 seconds] Electronic Signature(s) Signed: 08/04/2016 5:18:20 PM By: Curtis Sitesorthy, Joanna Entered By: Curtis Sitesorthy, Joanna on 08/04/2016 14:42:44 [name], [name] ([id number]) -------------------------------------------------------------------------------- Multi Wound Chart Details Patient Name: [name], [name] Date of Service: 08/04/2016 2:15 PM Medical Record Number: [id number] Patient Account Number: [id number] Date of Birth/Sex: [birth year] [age] y.o. 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 the Lower Extremity Comorbid History: Anemia, Hypertension, Anemia, Hypertension, N/A Type II Diabetes Type II Diabetes Date Acquired: 12/11/2015 12/11/2015 N/A Weeks of [name]: 14 14 N/A Wound Status: Open Open N/A Measurements L x W x D 0.8x0.2x0.2 0.8x2.9x0.2 N/A (cm) Area (cm) : 0.126 1.822 N/A Volume (cm) : 0.025 0.364 N/A % Reduction in Area: 97.90% 79.50% N/A % Reduction  in Volume: 97.90% 91.80% N/A Classification: Category/Stage II Category/Stage IV N/A HBO Classification: Grade 1 Grade 1 N/A Exudate Amount: Large Large N/A Exudate Type: Serosanguineous Serosanguineous N/A Exudate Color: red, brown red, brown N/A Foul Odor After No Yes N/A Cleansing: [name], [name] ([id number]) Odor Anticipated Due to N/A No N/A Product Use: Wound Margin: Thickened Thickened N/A Granulation Amount: Large (67-100%) Large (67-100%) N/A Granulation Quality: Red Red N/A Necrotic Amount: Small (1-33%) Small (1-33%) N/A Necrotic Tissue: Eschar, Adherent Slough Adherent Slough N/A Exposed Structures: Fascia: No Tendon: Yes N/A Fat: No Muscle: Yes Tendon: No Bone: Yes Muscle: No Fascia: No Joint: No Fat: No Bone: No Joint: No Limited to Skin Breakdown Epithelialization: None None N/A Periwound Skin Texture: No Abnormalities Noted Edema: Yes N/A Periwound Skin Maceration: Yes Maceration: Yes N/A Moisture: Moist: Yes Moist: Yes Periwound Skin Color: No Abnormalities Noted Erythema: Yes  N/A Erythema Location: N/A Circumferential N/A Temperature: No Abnormality No Abnormality N/A Tenderness on Yes Yes N/A Palpation: Wound Preparation: Ulcer Cleansing: Ulcer Cleansing: N/A Rinsed/Irrigated with Rinsed/Irrigated with Saline Saline Topical Anesthetic Topical Anesthetic Applied: Other: lidocaine Applied: Other: lidocaine 4% 4% [name] Notes Electronic Signature(s) Signed: 08/04/2016 5:18:20 PM By: Curtis Sites Entered By: Curtis Sites on 08/04/2016 15:09:18 Fundora, [name] -------------------------------------------------------------------------------- Multi-Disciplinary Care Plan Details Patient Name: Geidel, [name] Date of Service: 08/04/2016 2:15 PM Medical Record Number: [id number] Patient Account Number: [id number] Date of Birth/Sex: [birth year] (70 y.o. Male) Treating RN: Curtis Sites Primary Care Physician: Marga Melnick Other Clinician: Referring Physician: Marga Melnick Treating Physician/Extender: Altamese [place]ar in [name]: 14 Active Inactive Abuse / Safety / Falls / Self Care Management Nursing Diagnoses: Potential for falls Goals: Patient will remain injury free Date Initiated: 04/27/2016 Goal Status: Active Interventions: Assess fall risk on admission and as needed Assess self care needs on admission and as needed Notes: Nutrition Nursing Diagnoses: Imbalanced nutrition Goals: Patient/caregiver agrees to and verbalizes understanding of need to use nutritional supplements and/or vitamins as prescribed Date Initiated: 04/27/2016 Goal Status: Active Interventions: Assess patient nutrition upon admission and as needed per policy Notes: Orientation to the Wound Care Program Nursing Diagnoses: Knowledge deficit related to the wound healing center program Goals: Walkins, Haze Elbert Ewings ([id number]) Patient/caregiver will verbalize understanding of the Wound Healing Center Program Date Initiated: 04/27/2016 Goal  Status: Active Interventions: Provide education on orientation to the wound center Notes: Pain, Acute or Chronic Nursing Diagnoses: Pain, acute or chronic: actual or potential Potential alteration in comfort, pain Goals: Patient will verbalize adequate pain control and receive pain control interventions during procedures as needed Date Initiated: 04/27/2016 Goal Status: Active Patient/caregiver will verbalize adequate pain control between visits Date Initiated: 04/27/2016 Goal Status: Active Interventions: Assess comfort goal upon admission Complete pain assessment as per visit requirements Notes: Pressure Nursing Diagnoses: Knowledge deficit related to causes and risk factors for pressure ulcer development Knowledge deficit related to management of pressures ulcers Goals: Patient will remain free from development of additional pressure ulcers Date Initiated: 04/27/2016 Goal Status: Active Interventions: Assess: immobility, friction, shearing, incontinence upon admission and as needed Assess offloading mechanisms upon admission and as needed Assess potential for pressure ulcer upon admission and as needed Notes: Mudrick, [name] ([id number]) Wound/Skin Impairment Nursing Diagnoses: Impaired tissue integrity Goals: Ulcer/skin breakdown will have a volume reduction of 30% by week 4 Date Initiated: 04/27/2016 Goal Status: Active Ulcer/skin breakdown will have a volume reduction of 50% by week 8 Date Initiated: 04/27/2016 Goal Status: Active Ulcer/skin breakdown will have a volume  reduction of 80% by week 12 Date Initiated: 04/27/2016 Goal Status: Active Interventions: Assess patient/caregiver ability to perform ulcer/skin care regimen upon admission and as needed Assess ulceration(s) every visit Notes: Electronic Signature(s) Signed: 08/04/2016 5:18:20 PM By: Curtis Sitesorthy, Joanna Entered By: Curtis Sitesorthy, Joanna on 08/04/2016 15:09:08 Fennelly, [name]  ([id number]) -------------------------------------------------------------------------------- Pain Assessment Details Patient Name: Merriwether, Jadrien L. Date of Service: 08/04/2016 2:15 PM Medical Record Number: [id number] Patient Account Number: [id number] Date of Birth/Sex: [birth year] [age] y.o. Male) Treating RN: Curtis Sitesorthy, Joanna Primary Care Physician: Marga MelnickHOPPER, WILLIAM Other Clinician: Referring Physician: Marga MelnickHOPPER, WILLIAM Treating Physician/Extender: Altamese CarolinaOBSON, Kylar G Weeks in [name]: 14 Active Problems Location of Pain Severity and Description of Pain Patient Has Paino No Site Locations Pain Management and Medication Current Pain Management: Notes Topical or injectable lidocaine is offered to patient for acute pain when surgical debridement is performed. If needed, Patient is instructed to use over the counter pain medication for the following 24-48 hours after debridement. Wound care MDs do not prescribed pain medications. Patient has chronic pain or uncontrolled pain. Patient has been instructed to make an appointment with their Primary Care Physician for pain management. Electronic Signature(s) Signed: 08/04/2016 5:18:20 PM By: Curtis Sitesorthy, Joanna Entered By: Curtis Sitesorthy, Joanna on 08/04/2016 14:31:18 Bramblett, [name] ([id number]) -------------------------------------------------------------------------------- Patient/Caregiver Education Details Patient Name: Vanderkolk, [name] Date of Service: 08/04/2016 2:15 PM Medical Record Number: [id number] Patient Account Number: [id number] Date of Birth/Gender: [birth year] [age] y.o. Male) Treating RN: Curtis Sitesorthy, Joanna Primary Care Physician: Marga MelnickHOPPER, WILLIAM Other Clinician: Referring Physician: Marga MelnickHOPPER, WILLIAM Treating Physician/Extender: Altamese CarolinaOBSON, Juandaniel G Weeks in [name]: 14 Education Assessment Education Provided To: Patient and Caregiver Education Topics Provided Wound/Skin Impairment: Handouts: Other: wear open heeled shoes any time you are  ambulating Methods: Explain/Verbal Responses: State content correctly Electronic Signature(s) Signed: 08/04/2016 5:18:20 PM By: Curtis Sitesorthy, Joanna Entered By: Curtis Sitesorthy, Joanna on 08/04/2016 15:54:48 Doutt, [name] ([id number]) -------------------------------------------------------------------------------- Wound Assessment Details Patient Name: Nunziata, [name] Date of Service: 08/04/2016 2:15 PM Medical Record Number: [id number] Patient Account Number: [id number] Date of Birth/Sex: [birth year] [age] y.o. Male) Treating RN: Curtis Sitesorthy, Joanna Primary Care Physician: Marga MelnickHOPPER, WILLIAM Other Clinician: Referring Physician: Marga MelnickHOPPER, WILLIAM Treating Physician/Extender: Maxwell CaulOBSON, Derl G Weeks in [name]: 14 Wound Status Wound Number: 1 Primary Etiology: Pressure Ulcer Wound Location: Left Calcaneus Secondary Diabetic Wound/Ulcer of the Lower Etiology: Extremity Wounding Event: Pressure Injury Wound Status: Open Date Acquired: 12/11/2015 Comorbid Anemia, Hypertension, Type II Weeks Of [name]: 14 History: Diabetes Clustered Wound: No Photos Wound Measurements Length: (cm) 0.8 Width: (cm) 0.2 Depth: (cm) 0.2 Area: (cm) 0.126 Volume: (cm) 0.025 % Reduction in Area: 97.9% % Reduction in Volume: 97.9% Epithelialization: None Tunneling: No Undermining: No Wound Description Classification: Category/Stage II Diabetic Severity [address](Wagner): Grade 1 Wound Margin: Thickened Exudate Amount: Large Exudate Type: Serosanguineous Exudate Color: red, brown Foul Odor After Cleansing: No Wound Bed Granulation Amount: Large (67-100%) Exposed Structure Granulation Quality: Red Fascia Exposed: No Necrotic Amount: Small (1-33%) Fat Layer Exposed: No Necrotic Quality: Eschar, Adherent Slough Tendon Exposed: No Ruffolo, [name] ([id number]) Muscle Exposed: No Joint Exposed: No Bone Exposed: No Limited to Skin Breakdown Periwound Skin Texture Texture Color No Abnormalities Noted: No No  Abnormalities Noted: No Moisture Temperature / Pain No Abnormalities Noted: No Temperature: No Abnormality Maceration: Yes Tenderness on Palpation: Yes Moist: Yes Wound Preparation Ulcer Cleansing: Rinsed/Irrigated with Saline Topical Anesthetic Applied: Other: lidocaine 4%, [name] Notes Wound #1 (Left Calcaneus) 1. Cleansed with: Clean wound with Normal Saline 2. Anesthetic Topical Lidocaine 4% cream to wound bed prior to  debridement 4. Dressing Applied: Hydrafera Blue 5. Secondary Dressing Applied Guaze, ABD and kerlix/Conform 7. Secured with Tape Notes open heeled shoes given to patient Electronic Signature(s) Signed: 08/04/2016 5:18:20 PM By: Curtis Sites Entered By: Curtis Sites on 08/04/2016 15:05:59 Hoskie, [name] ([id number]) -------------------------------------------------------------------------------- Wound Assessment Details Patient Name: Covell, [name] Date of Service: 08/04/2016 2:15 PM Medical Record Number: [id number] Patient Account Number: [id number] Date of Birth/Sex: 06-03-1946 (70 y.o. Male) Treating RN: Curtis Sites Primary Care Physician: Marga Melnick Other Clinician: Referring Physician: Marga Melnick Treating Physician/Extender: Maxwell Caul Weeks in [name]: 14 Wound Status Wound Number: 2 Primary Etiology: Pressure Ulcer Wound Location: Right Calcaneus - Lateral Secondary Diabetic Wound/Ulcer of the Lower Etiology: Extremity Wounding Event: Pressure Injury Wound Status: Open Date Acquired: 12/11/2015 Comorbid Anemia, Hypertension, Type II Weeks Of [name]: 14 History: Diabetes Clustered Wound: No Photos Wound Measurements Length: (cm) 0.8 Width: (cm) 2.9 Depth: (cm) 0.2 Area: (cm) 1.822 Volume: (cm) 0.364 % Reduction in Area: 79.5% % Reduction in Volume: 91.8% Epithelialization: None Tunneling: No Undermining: No Wound Description Classification: Category/Stage IV Foul Odor A Diabetic Severity  (Wagner): Grade 1 Due to Prod Wound Margin: Thickened Exudate Amount: Large Exudate Type: Serosanguineous Exudate Color: red, brown fter Cleansing: Yes uct Use: No Wound Bed Granulation Amount: Large (67-100%) Exposed Structure Granulation Quality: Red Fascia Exposed: No Necrotic Amount: Small (1-33%) Fat Layer Exposed: No Necrotic Quality: Adherent Slough Tendon Exposed: Yes Cena, [name] ([id number]) Muscle Exposed: Yes Necrosis of Muscle: No Joint Exposed: No Bone Exposed: Yes Periwound Skin Texture Texture Color No Abnormalities Noted: No No Abnormalities Noted: No Localized Edema: Yes Erythema: Yes Erythema Location: Circumferential Moisture No Abnormalities Noted: No Temperature / Pain Maceration: Yes Temperature: No Abnormality Moist: Yes Tenderness on Palpation: Yes Wound Preparation Ulcer Cleansing: Rinsed/Irrigated with Saline Topical Anesthetic Applied: Other: lidocaine 4%, [name] Notes Wound #2 (Right, Lateral Calcaneus) 1. Cleansed with: Clean wound with Normal Saline 2. Anesthetic Topical Lidocaine 4% cream to wound bed prior to debridement 4. Dressing Applied: Hydrafera Blue 5. Secondary Dressing Applied Guaze, ABD and kerlix/Conform 7. Secured with Tape Notes open heeled shoes given to patient Electronic Signature(s) Signed: 08/04/2016 5:18:20 PM By: Curtis Sites Entered By: Curtis Sites on 08/04/2016 15:08:54 Railsback, [name] ([id number]) -------------------------------------------------------------------------------- Vitals Details Patient Name: Bruun, [name] Date of Service: 08/04/2016 2:15 PM Medical Record Number: [id number] Patient Account Number: [id number] Date of Birth/Sex: 06/12/1946 (70 y.o. Male) Treating RN: Curtis Sites Primary Care Physician: Marga Melnick Other Clinician: Referring Physician: Marga Melnick Treating Physician/Extender: Altamese [place] in [name]: 14 Vital Signs Time Taken:  14:32 Temperature (F): 98.1 Height (in): 65 Pulse (bpm): 79 Respiratory Rate (breaths/min): 18 Blood Pressure (mmHg): 115/48 Reference Range: 80 - 120 mg / dl Electronic Signature(s) Signed: 08/04/2016 5:18:20 PM By: Curtis Sites Entered By: Curtis Sites on 08/04/2016 14:33:46

## 2016-08-05 NOTE — Progress Notes (Addendum)
[name] ([id number]) Visit Report for 08/04/2016 Chief Complaint Document Details Patient Name: [name], [name] Date of Service: 08/04/2016 2:15 PM Medical Record Patient Account Number: [id number] Number: Treating RN: Curtis Sites 08-18-1946 (70 y.o. Other Clinician: Date of Birth/Sex: Male) Treating Domonic Kimball, Kayan Primary Care Physician: Marga Melnick Physician/Extender: G Referring Physician: Shellee Milo in Treatment: 14 Information Obtained from: Patient Chief Complaint Patient is here for our review of bilateral heel ulcers for the last 5-6 months Electronic Signature(s) Signed: 08/04/2016 5:34:52 PM By: Baltazar Najjar MD Entered By: Baltazar Najjar on 08/04/2016 15:42:30 [name], [name] ([id number]) -------------------------------------------------------------------------------- Debridement Details Patient Name: [name], [name] Date of Service: 08/04/2016 2:15 PM Medical Record Patient Account Number: [id number] Number: Treating RN: Curtis Sites Aug 11, 1946 (70 y.o. Other Clinician: Date of Birth/Sex: Male) Treating Jarius Dieudonne, Temple Primary Care Physician: Marga Melnick Physician/Extender: G Referring Physician: Shellee Milo in Treatment: 14 Debridement Performed for Wound #1 Left Calcaneus Assessment: Performed By: Physician Maxwell Caul, MD Debridement: Debridement Pre-procedure Yes - 15:07 Verification/Time Out Taken: Start Time: 15:08 Pain Control: Lidocaine 4% Topical Solution Level: Skin/Subcutaneous Tissue Total Area Debrided (L x 0.8 (cm) x 0.2 (cm) = 0.16 (cm) W): Tissue and other Viable, Non-Viable, Fibrin/Slough, Skin, Subcutaneous material debrided: Instrument: Curette Bleeding: Minimum Hemostasis Achieved: Pressure End Time: 15:10 Procedural Pain: 0 Post Procedural Pain: 0 Response to Treatment: Procedure was tolerated well Post Debridement Measurements of Total Wound Length:  (cm) 0.8 Stage: Category/Stage II Width: (cm) 0.2 Depth: (cm) 0.2 Volume: (cm) 0.025 Character of Wound/Ulcer Post Improved Debridement: Severity of Tissue Post Fat layer exposed Debridement: Post Procedure Diagnosis Same as Pre-procedure Electronic Signature(s) Signed: 08/04/2016 3:51:43 PM By: Osie Bond ([id number]) Signed: 08/04/2016 5:34:52 PM By: Baltazar Najjar MD Entered By: Curtis Sites on 08/04/2016 15:51:43 [name], [name] ([id number]) -------------------------------------------------------------------------------- HPI Details Patient Name: [name], [name] Date of Service: 08/04/2016 2:15 PM Medical Record Patient Account Number: [id number] Number: Treating RN: Curtis Sites [birth year] (70 y.o. Other Clinician: Date of Birth/Sex: Male) Treating Angy Swearengin, Tallie Primary Care Physician: Marga Melnick Physician/Extender: G Referring Physician: Shellee Milo in Treatment: 14 History of Present Illness HPI Description: 04/27/16; this is a 70 year old man who is a resident of [facility] in [place]. He is accompanied by a family friend today. She tells me that he has had problems with pressure ulcers since December 2016 largely at a time where he apparently had diabetic hypoglycemic episodes. Apparently these became so severe that he had to be transferred to [facility] for operative debridement apparently bilaterally. I don't have any records from [facility] but per his friend they did not do imaging studies bone cultures arterial studies etc. Apparently the area just applying wet to dry dressings to these. The patient is a diabetic on insulin. His arterial studies here were noncompressible although his pulses could be dopplered. The patient has a lot of other medical issues including cirrhosis of the liver with portal hypertension, chronic pancytopenia, chronic presumably hepatic encephalopathy, moderate  to severe aortic stenosis an echocardiogram in April 2016. At one point I note in his notes he was on hospice I'm not really sure whether that is true currently or not. 05/04/16; x-ray of the right heel done in his skilled facility was negative for significant bone pathology. Arterial studies also done in the facility revealed noncompressible vessels therefore no ABI could be calculated. His arterial ultrasound showed low velocities scattered atherosclerotic disease negative for focal stenosis but monophasic waveforms  bilaterally. The overall impression was "mild peripheral arterial disease" 05/11/16; culture last week of the right heel grew MRSA and group B strep. We started doxycycline today. 05/19/16; I started the patient on doxycycline 8 days ago. He has not tolerated this well secondary to gastric irritation. He is also complaining of increasing pain in the right heel. 05/26/16; the patient is completing his Zyvox that I gave him last week. Culture grew heavy group B strep and a few Escherichia coli. Surprisingly did not culture any further MRSA. Apligraf #1 06/09/16 both heal wounds are here for review today. Apligraf #2 06/23/16: pt returns today for ongoing evaluation and management of bilateral calcaneal wounds. Apligraf #3 07/07/16; patient returns today for bilateral calcaneal wounds area left heel is considerably better right is not infected stagnant. Apligraf #4 07/21/2016 -- He is here for Apligraf #5 08/04/16; the patient has a vertical area on the Achilles aspect of his left heel and a area on the right lateral heel. Both of these looks a lot better after the 5 Apligraf applications. There is no evidence of infection. Electronic Signature(s) Signed: 08/04/2016 5:34:52 PM By: Baltazar Najjar MD Entered By: Baltazar Najjar on 08/04/2016 15:43:14 [name] ([id number]) -------------------------------------------------------------------------------- Physical Exam Details Patient  Name: [name], [name] Date of Service: 08/04/2016 2:15 PM Medical Record Patient Account Number: [id number] Number: Treating RN: Curtis Sites 06-23-1946 (70 y.o. Other Clinician: Date of Birth/Sex: Male) Treating Bunnie Rehberg, [place] Primary Care Physician: Marga Melnick Physician/Extender: G Referring Physician: Shellee Milo in Treatment: 14 Notes The wound on the right lateral calcaneus is fairly clean it appears that skin is going down into the divot of the wound which would be an acceptable outcome if this wound healed. The linear area on the left heel was debrided of callus surface slough and again since clean up quite nicely. No evidence of infection in either site Electronic Signature(s) Signed: 08/04/2016 5:34:52 PM By: Baltazar Najjar MD Entered By: Baltazar Najjar on 08/04/2016 15:44:04 [name] ([id number]) -------------------------------------------------------------------------------- Physician Orders Details Patient Name: [name], [name] Date of Service: 08/04/2016 2:15 PM Medical Record Patient Account Number: [id number] Number: Treating RN: Curtis Sites 08-20-1946 (70 y.o. Other Clinician: Date of Birth/Sex: Male) Treating Semaja Lymon, Artemus Primary Care Physician: Marga Melnick Physician/Extender: G Referring Physician: Shellee Milo in Treatment: 14 Verbal / Phone Orders: Yes Clinician: Curtis Sites Read Back and Verified: Yes Diagnosis Coding Wound Cleansing Wound #1 Left Calcaneus o Clean wound with Normal Saline. - FOR CLINIC USE Wound #2 Right,Lateral Calcaneus o Clean wound with Normal Saline. - FOR CLINIC USE Anesthetic Wound #1 Left Calcaneus o Topical Lidocaine 4% cream applied to wound bed prior to debridement - for clinic use only Wound #2 Right,Lateral Calcaneus o Topical Lidocaine 4% cream applied to wound bed prior to debridement - for clinic use only Skin Barriers/Peri-Wound  Care Wound #1 Left Calcaneus o Barrier cream - FOR CLINIC USE Wound #2 Right,Lateral Calcaneus o Barrier cream - FOR CLINIC USE Primary Wound Dressing Wound #1 Left Calcaneus o Hydrafera Blue Wound #2 Right,Lateral Calcaneus o Hydrafera Blue Secondary Dressing Wound #1 Left Calcaneus o ABD and Kerlix/Conform o XtraSorb - CHARCOAL, HEEL CUP Wound #2 Right,Lateral Calcaneus [name], [name] ([id number]) o ABD and Kerlix/Conform o XtraSorb - CHARCOAL, HEEL CUP Dressing Change Frequency Wound #1 Left Calcaneus o Change dressing every other day. Wound #2 Right,Lateral Calcaneus o Change dressing every other day. Follow-up Appointments Wound #1 Left Calcaneus o Return Appointment in 1 week. -  for nurse visit Wound #2 Right,Lateral Calcaneus o Return Appointment in 1 week. - for nurse visit Off-Loading Wound #1 Left Calcaneus o Heel suspension boot to: - please float the heels when pt is sitting with feet up or when pt is lying in bed. o Turn and reposition every 2 hours Wound #2 Right,Lateral Calcaneus o Heel suspension boot to: - please float the heels when pt is sitting with feet up or when pt is lying in bed. o Turn and reposition every 2 hours Additional Orders / Instructions Wound #1 Left Calcaneus o Increase protein intake. o Other: - Physical Therapy - Patient may stand for transfers and stand for therapy and walk [place] distances only Wound #2 Right,Lateral Calcaneus o Increase protein intake. o Other: - Physical Therapy - Patient may stand for transfers and stand for therapy and walk [place] distances only Medications-please add to medication list. Wound #1 Left Calcaneus o Other: - Vitamin C, Zinc, Multivitamin Wound #2 Right,Lateral Calcaneus o Other: - Vitamin C, Zinc, Multivitamin [name], [name] ([id number]) Electronic Signature(s) Signed: 08/04/2016 5:18:20 PM By: Curtis Sitesorthy, Joanna Signed: 08/04/2016 5:34:52 PM By:  Baltazar Najjarobson, Ilia MD Entered By: Curtis Sitesorthy, Joanna on 08/04/2016 15:13:05 [name], [name] ([id number]) -------------------------------------------------------------------------------- Problem List Details Patient Name: Zietz, Pancho L. Date of Service: 08/04/2016 2:15 PM Medical Record Patient Account Number: [id number] Number: Treating RN: Curtis SitesDorthy, Joanna June 05, 1946 (70 y.o. 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 ([id number]) -------------------------------------------------------------------------------- Progress Note Details Patient Name: Bumgarner, [name] Date of Service: 08/04/2016 2:15 PM Medical Record Patient Account Number: [id number] Number: Treating RN: Curtis SitesDorthy, Joanna June 05, 1946 (70 y.o. Other Clinician: Date of Birth/Sex: Male) Treating Edem Tiegs, Dariusz Primary Care Physician: Marga MelnickHOPPER, WILLIAM Physician/Extender: G Referring Physician: Shellee MiloHOPPER, WILLIAM Weeks in Treatment: 14 Subjective Chief Complaint Information obtained from Patient Patient is here for our review of bilateral heel ulcers for the last 5-6 months History of Present Illness (HPI) 04/27/16; this is a 70 year old man who is a resident of [facility] in [place].  He is accompanied by a family friend today. She tells me that he has had problems with pressure ulcers since December 2016 largely at a time where he apparently had diabetic hypoglycemic episodes. Apparently these became so severe that he had to be transferred to [facility] for operative debridement apparently bilaterally. I don't have any records from [facility] but per his friend they did not do imaging studies bone cultures arterial studies etc. Apparently the area just applying wet to dry dressings to these. The patient is a diabetic on insulin. His arterial studies here were noncompressible although his pulses could be dopplered. 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 At one point I note in his notes he was on hospice I'm not really sure whether that is true currently or  not. 05/04/16; x-ray of the right heel done in his skilled facility was negative for significant bone pathology. Arterial studies also done in the facility revealed noncompressible vessels therefore no ABI could be calculated. His arterial ultrasound showed low velocities scattered atherosclerotic disease negative for focal stenosis but monophasic waveforms bilaterally. The overall impression was "mild peripheral arterial disease" 05/11/16; culture last week of the right heel grew MRSA and group B strep. We started doxycycline today. 05/19/16; I started the patient on doxycycline 8 days ago. He has not tolerated this well secondary to gastric irritation. He is also complaining of increasing pain in the right heel. 05/26/16; the patient is completing his Zyvox that I gave him last week. Culture grew heavy group B strep and a few Escherichia coli. Surprisingly did not culture any further MRSA. Apligraf #1 06/09/16 both heal wounds are  here for review today. Apligraf #2 06/23/16: pt returns today for ongoing evaluation and management of bilateral calcaneal wounds. Apligraf #3 07/07/16; patient returns today for bilateral calcaneal wounds area left heel is considerably better right is not infected stagnant. Apligraf #4 07/21/2016 -- He is here for Apligraf #5 08/04/16; the patient has a vertical area on the Achilles aspect of his left heel and a area on the right lateral heel. Both of these looks a lot better after the 5 Apligraf applications. There is no evidence of infection. [name], [name] ([id number]) Objective Constitutional Vitals Time Taken: 2:32 PM, Height: 65 in, Temperature: 98.1 F, Pulse: 79 bpm, Respiratory Rate: 18 breaths/min, Blood Pressure: 115/48 mmHg. Integumentary (Hair, Skin) Wound #1 status is Open. Original cause of wound was Pressure Injury. The wound is located on the Left Calcaneus. The wound measures 0.8cm length x 0.2cm width x 0.2cm depth; 0.126cm^2 area and 0.025cm^3 volume. The wound is limited to skin breakdown. There is no tunneling or undermining noted. There is a large amount of serosanguineous drainage noted. The wound margin is thickened. There is large (67-100%) red granulation within the wound bed. There is a small (1-33%) amount of necrotic tissue within the wound bed including Eschar and Adherent Slough. The periwound skin appearance exhibited: Maceration, Moist. Periwound temperature was noted as No Abnormality. The periwound has tenderness on palpation. Wound #2 status is Open. Original cause of wound was Pressure Injury. The wound is located on the Right,Lateral Calcaneus. The wound measures 0.8cm length x 2.9cm width x 0.2cm depth; 1.822cm^2 area and 0.364cm^3 volume. There is bone, muscle, and tendon exposed. There is no tunneling or undermining noted. There is a large amount of serosanguineous drainage noted. The wound margin is thickened. There is large (67-100%) red  granulation within the wound bed. There is a small (1-33%) amount of necrotic tissue within the wound bed including Adherent Slough. The periwound skin appearance exhibited: Localized Edema, Maceration, Moist, Erythema. The surrounding wound skin color is noted with erythema which is circumferential. Periwound temperature was noted as No Abnormality. The periwound has tenderness on palpation. Assessment Active Problems ICD-10 E11.621 - Type 2 diabetes mellitus with foot ulcer E11.42 - Type 2 diabetes mellitus with diabetic polyneuropathy L89.614 - Pressure ulcer of right heel, stage 4 L89.623 - Pressure ulcer of left heel, stage 3 I35.0 - Nonrheumatic aortic (valve) stenosis Merrow, Zoltan L. ([id number]) Procedures Wound #1 Wound #1 is a Pressure Ulcer located on the Left Calcaneus . There was a Skin/Subcutaneous Tissue Debridement ([id number]) debridement with total area of 0.16 sq cm performed by Maxwell Caul, MD. with the following instrument(s): Curette to remove  Viable and Non-Viable tissue/material including Fibrin/Slough, Skin, and Subcutaneous after achieving pain control using Lidocaine 4% Topical Solution. A time out was conducted at 15:07, prior to the start of the procedure. A Minimum amount of bleeding was controlled with Pressure. The procedure was tolerated well with a pain level of 0 throughout and a pain level of 0 following the procedure. Post Debridement Measurements: 0.8cm length x 0.2cm width x 0.2cm depth; 0.025cm^3 volume. Post debridement Stage noted as Category/Stage II. Character of Wound/Ulcer Post Debridement is improved. Severity of Tissue Post Debridement is: Fat layer exposed. Post procedure Diagnosis Wound #1: Same as Pre-Procedure Plan Wound Cleansing: Wound #1 Left Calcaneus: Clean wound with Normal Saline. - FOR CLINIC USE Wound #2 Right,Lateral Calcaneus: Clean wound with Normal Saline. - FOR CLINIC USE Anesthetic: Wound #1 Left  Calcaneus: Topical Lidocaine 4% cream applied to wound bed prior to debridement - for clinic use only Wound #2 Right,Lateral Calcaneus: Topical Lidocaine 4% cream applied to wound bed prior to debridement - for clinic use only Skin Barriers/Peri-Wound Care: Wound #1 Left Calcaneus: Barrier cream - FOR CLINIC USE Wound #2 Right,Lateral Calcaneus: Barrier cream - FOR CLINIC USE Primary Wound Dressing: Wound #1 Left Calcaneus: Hydrafera Blue Wound #2 Right,Lateral Calcaneus: Hydrafera Blue Secondary Dressing: Wound #1 Left Calcaneus: ABD and Kerlix/Conform XtraSorb - CHARCOAL, HEEL CUP Wound #2 Right,Lateral Calcaneus: ABD and Kerlix/Conform XtraSorb - CHARCOAL, HEEL CUP Hinely, Adham L. ([id number]) Dressing Change Frequency: Wound #1 Left Calcaneus: Change dressing every other day. Wound #2 Right,Lateral Calcaneus: Change dressing every other day. Follow-up Appointments: Wound #1 Left Calcaneus: Return Appointment in 1 week. - for nurse visit Wound #2 Right,Lateral Calcaneus: Return Appointment in 1 week. - for nurse visit Off-Loading: Wound #1 Left Calcaneus: Heel suspension boot to: - please float the heels when pt is sitting with feet up or when pt is lying in bed. Turn and reposition every 2 hours Wound #2 Right,Lateral Calcaneus: Heel suspension boot to: - please float the heels when pt is sitting with feet up or when pt is lying in bed. Turn and reposition every 2 hours Additional Orders / Instructions: Wound #1 Left Calcaneus: Increase protein intake. Other: - Physical Therapy - Patient may stand for transfers and stand for therapy and walk [place] distances only Wound #2 Right,Lateral Calcaneus: Increase protein intake. Other: - Physical Therapy - Patient may stand for transfers and stand for therapy and walk [place] distances only Medications-please add to medication list.: Wound #1 Left Calcaneus: Other: - Vitamin C, Zinc, Multivitamin Wound #2 Right,Lateral  Calcaneus: Other: - Vitamin C, Zinc, Multivitamin #1 after the 5 Apligraf site changed him to [facility], foam change every 2 days. #2 I've given him open healed, healing sandals #3 I am allowing him to be up to walk with physical therapy, not being able to walk due to immobility is not a good outcome even if his wounds healed. However I am limiting this to physical therapy only Electronic Signature(s) Signed: 08/13/2016 11:47:23 AM By: Elliot Gurney RN, BSN, Kim RN, BSN Signed: 08/26/2016 7:48:57 AM By: Baltazar Najjar MD Previous Signature: 08/04/2016 5:34:52 PM Version By: Baltazar Najjar MD TREMANE, SPURGEON ([id number]) Entered By: Elliot Gurney RN, BSN, Kim on 08/13/2016 11:47:22 Kazee, [name] ([id number]) -------------------------------------------------------------------------------- SuperBill Details Patient Name: Eidem, Quaron L. Date of Service: 08/04/2016 Medical Record Patient Account Number: [id number] Number: Treating RN: Curtis Sites 1946/02/18 (70 y.o. 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, stage 4 L89.623 Pressure ulcer of left heel, stage 3 I35.0 Nonrheumatic aortic (valve) stenosis Facility Procedures CPT4 Code: [id number] Description: 11042 - DEB SUBQ TISSUE 20 SQ CM/< ICD-10 Description Diagnosis L89.623 Pressure ulcer of left heel, stage 3 Modifier: Quantity: 1 Physician Procedures CPT4 Code: [id number] Description: 11042 - WC PHYS SUBQ TISS 20 SQ CM ICD-10 Description Diagnosis L89.623 Pressure ulcer of left heel, stage 3 Modifier: Quantity: 1 Electronic Signature(s) Signed: 08/04/2016 5:34:52 PM By: Baltazar Najjarobson, Glover  MD Entered By: Baltazar Najjarobson, Johm on 08/04/2016 15:45:45

## 2016-08-11 ENCOUNTER — Encounter: Payer: PRIVATE HEALTH INSURANCE | Admitting: Internal Medicine

## 2016-08-11 DIAGNOSIS — E11621 Type 2 diabetes mellitus with foot ulcer: Secondary | ICD-10-CM | POA: Diagnosis not present

## 2016-08-13 NOTE — Progress Notes (Signed)
[name] ([id number]) Visit Report for 08/11/2016 Chief Complaint Document Details Patient Name: [name], [name] Date of Service: 08/11/2016 3:00 PM Medical Record Patient Account Number: [id number] Number: Treating RN: Phillis Haggis 23-Oct-1946 (70 y.o. Other Clinician: Date of Birth/Sex: Male) Treating [name] Primary Care Physician: Marga Melnick Physician/Extender: G Referring Physician: Shellee Milo in Treatment: 15 Information Obtained from: Patient Chief Complaint Patient is here for our review of bilateral heel ulcers for the last 5-6 months Electronic Signature(s) Signed: 08/11/2016 4:33:17 PM By: Baltazar Najjar MD Entered By: Baltazar Najjar on 08/11/2016 15:36:58 [name], [name] ([id number]) -------------------------------------------------------------------------------- Debridement Details Patient Name: [name], [name] Date of Service: 08/11/2016 3:00 PM Medical Record Patient Account Number: [id number] Number: Treating RN: Phillis Haggis 1946-08-29 (70 y.o. Other Clinician: Date of Birth/Sex: Male) Treating [name] Primary Care Physician: Marga Melnick Physician/Extender: G Referring Physician: Shellee Milo in Treatment: 15 Debridement Performed for Wound #1 Left Calcaneus Assessment: Performed By: Physician Maxwell Caul, MD Debridement: Debridement Pre-procedure Yes - 15:26 Verification/Time Out Taken: Start Time: 15:29 Pain Control: Lidocaine 4% Topical Solution Level: Skin/Subcutaneous Tissue Total Area Debrided (L x 0.2 (cm) x 0.2 (cm) = 0.04 (cm) W): Tissue and other Viable, Non-Viable, Exudate, Fibrin/Slough, Subcutaneous material debrided: Instrument: Blade, Forceps Bleeding: Minimum Hemostasis Achieved: Pressure End Time: 15:33 Procedural Pain: 0 Post Procedural Pain: 0 Response to Treatment: Procedure was tolerated well Post Debridement Measurements of Total  Wound Length: (cm) 0.9 Stage: Category/Stage II Width: (cm) 0.3 Depth: (cm) 0.2 Volume: (cm) 0.042 Character of Wound/Ulcer Post Requires Further Debridement: Debridement Severity of Tissue Post Limited to breakdown of skin Debridement: Post Procedure Diagnosis Same as Pre-procedure Electronic Signature(s) Signed: 08/11/2016 4:33:17 PM By: Baltazar Najjar MD [name] ([id number]) Signed: 08/12/2016 4:42:28 PM By: Alejandro Mulling Entered By: Baltazar Najjar on 08/11/2016 15:36:27 [name], [name] ([id number]) -------------------------------------------------------------------------------- Debridement Details Patient Name: [name], [name] Date of Service: 08/11/2016 3:00 PM Medical Record Patient Account Number: [id number] Number: Treating RN: Phillis Haggis 1946/11/01 (70 y.o. Other Clinician: Date of Birth/Sex: Male) Treating ROBSON, [name] Primary Care Physician: Marga Melnick Physician/Extender: G Referring Physician: Shellee Milo in Treatment: 15 Debridement Performed for Wound #2 Right,Lateral Calcaneus Assessment: Performed By: Physician Maxwell Caul, MD Debridement: Debridement Pre-procedure Yes - 15:26 Verification/Time Out Taken: Start Time: 15:26 Pain Control: Lidocaine 4% Topical Solution Level: Skin/Subcutaneous Tissue Total Area Debrided (L x 1 (cm) x 2 (cm) = 2 (cm) W): Tissue and other Viable, Non-Viable, Exudate, Fibrin/Slough, Subcutaneous material debrided: Instrument: Blade, Forceps Bleeding: Minimum Hemostasis Achieved: Pressure End Time: 15:29 Procedural Pain: 0 Post Procedural Pain: 0 Response to Treatment: Procedure was tolerated well Post Debridement Measurements of Total Wound Length: (cm) 1 Stage: Category/Stage IV Width: (cm) 2 Depth: (cm) 0.3 Volume: (cm) 0.471 Character of Wound/Ulcer Post Requires Further Debridement: Debridement Severity of Tissue Post Limited to breakdown of  skin Debridement: Post Procedure Diagnosis Same as Pre-procedure Electronic Signature(s) Signed: 08/11/2016 4:33:17 PM By: Baltazar Najjar MD [name] ([id number]) Signed: 08/12/2016 4:42:28 PM By: Alejandro Mulling Entered By: Baltazar Najjar on 08/11/2016 15:36:41 [name], Toron L. ([id number]) -------------------------------------------------------------------------------- HPI Details Patient Name: [name], [name] Date of Service: 08/11/2016 3:00 PM Medical Record Patient Account Number: [id number] Number: Treating RN: Phillis Haggis 12/05/1946 (70 y.o. Other Clinician: Date of Birth/Sex: Male) Treating [name] Primary Care Physician: Marga Melnick Physician/Extender: G Referring Physician: Shellee Milo in Treatment: 15 History of Present Illness HPI Description: 04/27/16; this is a 70 year old  man who is a resident of [facility] in [place]. He is accompanied by a family friend today. She tells me that he has had problems with pressure ulcers since December 2016 largely at a time where he apparently had diabetic hypoglycemic episodes. Apparently these became so severe that he had to be transferred to [facility] for operative debridement apparently bilaterally. I don't have any records from [facility] but per his friend they did not do imaging studies bone cultures arterial studies etc. Apparently the area just applying wet to dry dressings to these. The patient is a diabetic on insulin. His arterial studies here were noncompressible although his pulses could be dopplered. 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 At one point I note in his notes he was on hospice I'm not really sure whether that is true currently or not. 05/04/16;  x-ray of the right heel done in his skilled facility was negative for significant bone pathology. Arterial studies also done in the facility revealed noncompressible vessels therefore no ABI could be calculated. His arterial ultrasound showed low velocities scattered atherosclerotic disease negative for focal stenosis but monophasic waveforms bilaterally. The overall impression was "mild peripheral arterial disease" 05/11/16; culture last week of the right heel grew MRSA and group B strep. We started doxycycline today. 05/19/16; I started the patient on doxycycline 8 days ago. He has not tolerated this well secondary to gastric irritation. He is also complaining of increasing pain in the right heel. 05/26/16; the patient is completing his Zyvox that I gave him last week. Culture grew heavy group B strep and a few Escherichia coli. Surprisingly did not culture any further MRSA. Apligraf #1 06/09/16 both heal wounds are here for review today. Apligraf #2 06/23/16: pt returns today for ongoing evaluation and management of bilateral calcaneal wounds. Apligraf #3 07/07/16; patient returns today for bilateral calcaneal wounds area left heel is considerably better right is not infected stagnant. Apligraf #4 07/21/2016 -- He is here for Apligraf #5 08/04/16; the patient has a vertical area on the Achilles aspect of his left heel and a area on the right lateral heel. Both of these looks a lot better after the 5 Apligraf applications. There is no evidence of infection. 08/11/16; right heel lateral aspect the. This continues to close although the skin is going down into the wound. There will be a divot here at the end on the left heel there was a considerable amount of eschar which I removed. There is still a small open area Electronic Signature(s) Signed: 08/11/2016 4:33:17 PM By: Baltazar Najjar MD Entered By: Baltazar Najjar on 08/11/2016 15:41:40 Wohlford, [name]  ([id number]) -------------------------------------------------------------------------------- Physical Exam Details Patient Name: [name], [name] Date of Service: 08/11/2016 3:00 PM Medical Record Patient Account Number: [id number] Number: Treating RN: Phillis Haggis April 09, 1946 (70 y.o. Other Clinician: Date of Birth/Sex: Male) Treating ROBSON, Rojelio Primary Care Physician: Marga Melnick Physician/Extender: G Referring Physician: Shellee Milo in Treatment: 15 Notes Wound exam; right medial again this is smaller. Base is healthy skin growing down into the wound area. The area on the left heel requires an extensive debridement to reveal leg small remaining open area. No evidence of infection at either site Electronic Signature(s) Signed: 08/11/2016 4:33:17 PM By: Baltazar Najjar MD Entered By: Baltazar Najjar on 08/11/2016 15:42:22 [name], [name] ([id number]) -------------------------------------------------------------------------------- Physician Orders  Details Patient Name: [name], [name] Date of Service: 08/11/2016 3:00 PM Medical Record Patient Account Number: [id number] Number: Treating RN: Phillis Haggisinkerton, Debi 12-27-1945 (70 y.o. Other Clinician: Date of Birth/Sex: Male) Treating ROBSON, Rio Primary Care Physician: Marga MelnickHOPPER, WILLIAM Physician/Extender: G Referring Physician: Shellee MiloHOPPER, WILLIAM Weeks in Treatment: 15 Verbal / Phone Orders: Yes Clinician: Pinkerton, Debi Read Back and Verified: Yes Diagnosis Coding Wound Cleansing Wound #1 Left Calcaneus o Clean wound with Normal Saline. Wound #2 Right,Lateral Calcaneus o Clean wound with Normal Saline. Anesthetic Wound #1 Left Calcaneus o Topical Lidocaine 4% cream applied to wound bed prior to debridement - for clinic use only Wound #2 Right,Lateral Calcaneus o Topical Lidocaine 4% cream applied to wound bed prior to debridement - for clinic use only Primary Wound  Dressing Wound #1 Left Calcaneus o Aquacel Ag Wound #2 Right,Lateral Calcaneus o Hydrafera Blue Secondary Dressing Wound #1 Left Calcaneus o ABD and Kerlix/Conform Wound #2 Right,Lateral Calcaneus o ABD and Kerlix/Conform Dressing Change Frequency Wound #1 Left Calcaneus o Change dressing every other day. Wound #2 Right,Lateral Calcaneus o Change dressing every other day. Kammerer, Danie L. ([id number]) Follow-up Appointments Wound #1 Left Calcaneus o Return Appointment in 1 week. - for nurse visit Wound #2 Right,Lateral Calcaneus o Return Appointment in 1 week. - for nurse visit Off-Loading Wound #1 Left Calcaneus o Heel suspension boot to: - please float the heels when pt is sitting with feet up or when pt is lying in bed. o Turn and reposition every 2 hours Wound #2 Right,Lateral Calcaneus o Heel suspension boot to: - please float the heels when pt is sitting with feet up or when pt is lying in bed. o Turn and reposition every 2 hours Additional Orders / Instructions Wound #1 Left Calcaneus o Increase protein intake. o Other: - Physical Therapy - Patient may stand for transfers and stand for therapy and walk [place] distances only Wound #2 Right,Lateral Calcaneus o Increase protein intake. o Other: - Physical Therapy - Patient may stand for transfers and stand for therapy and walk [place] distances only Medications-please add to medication list. Wound #1 Left Calcaneus o Other: - Vitamin C, Zinc, Multivitamin Wound #2 Right,Lateral Calcaneus o Other: - Vitamin C, Zinc, Multivitamin Electronic Signature(s) Signed: 08/11/2016 4:33:17 PM By: Baltazar Najjarobson, Bee MD Signed: 08/12/2016 4:42:28 PM By: Alejandro MullingPinkerton, Debra Entered By: Alejandro MullingPinkerton, Debra on 08/11/2016 15:36:49 Goering, Zebulan L. ([id number]) -------------------------------------------------------------------------------- Problem List Details Patient Name: [name], [name] Date of  Service: 08/11/2016 3:00 PM Medical Record Patient Account Number: [id number] Number: Treating RN: Phillis Haggisinkerton, Debi 12-27-1945 (70 y.o. 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 ([id number]) -------------------------------------------------------------------------------- Progress Note Details Patient Name: [name], [name] Date of Service: 08/11/2016 3:00 PM Medical Record Patient Account Number: [id number] Number: Treating RN: Phillis Haggisinkerton, Debi 12-27-1945 (70 y.o. Other Clinician: Date of Birth/Sex: Male) Treating ROBSON, Jaishaun Primary Care Physician: Marga MelnickHOPPER, WILLIAM Physician/Extender: G Referring Physician: Shellee MiloHOPPER, WILLIAM Weeks in Treatment: 15  Subjective Chief Complaint Information obtained from Patient Patient is here for our review of bilateral heel ulcers for the last 5-6 months History of Present Illness (HPI) 04/27/16; this is a 70 year old man who is a resident of [facility] in [place]. He is accompanied by a family friend today. She tells me that he has had problems with pressure ulcers since December 2016 largely at a time where he apparently had diabetic hypoglycemic episodes. Apparently these became so severe that he had  to be transferred to [facility] for operative debridement apparently bilaterally. I don't have any records from [facility] but per his friend they did not do imaging studies bone cultures arterial studies etc. Apparently the area just applying wet to dry dressings to these. The patient is a diabetic on insulin. His arterial studies here were noncompressible although his pulses could be dopplered. 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 At one point I note in his notes he was on hospice I'm not really sure whether that is true currently or not. 05/04/16; x-ray of the right heel done in his skilled facility was negative for significant bone pathology. Arterial studies also done in the facility revealed noncompressible vessels therefore no ABI could be calculated. His arterial ultrasound showed low velocities scattered atherosclerotic disease negative for focal stenosis but monophasic waveforms bilaterally. The overall impression was "mild peripheral arterial disease" 05/11/16; culture last week of the right heel grew MRSA and group B strep. We started doxycycline today. 05/19/16; I started the patient on doxycycline 8 days ago. He has not tolerated this well secondary to gastric irritation. He is also complaining of increasing pain in the right heel. 05/26/16; the patient is completing his Zyvox that I gave him last week. Culture grew heavy group B strep and a few Escherichia coli. Surprisingly did not culture any further MRSA. Apligraf #1 06/09/16 both heal wounds are here for review today. Apligraf #2 06/23/16: pt returns today for ongoing evaluation and management of bilateral calcaneal wounds. Apligraf #3 07/07/16; patient returns today for bilateral calcaneal wounds area left heel is considerably better  right is not infected stagnant. Apligraf #4 07/21/2016 -- He is here for Apligraf #5 08/04/16; the patient has a vertical area on the Achilles aspect of his left heel and a area on the right lateral heel. Both of these looks a lot better after the 5 Apligraf applications. There is no evidence of infection. 08/11/16; right heel lateral aspect the. This continues to close although the skin is going down into the wound. There will be a divot here at the end on the left heel there was a considerable amount of eschar which I removed. There is still a small open area [name], [name] ([id number]) Objective Constitutional Vitals Time Taken: 3:14 PM, Height: 65 in, Temperature: 97.8 F, Pulse: 81 bpm, Respiratory Rate: 18 breaths/min, Blood Pressure: 125/60 mmHg. Integumentary (Hair, Skin) Wound #1 status is Open. Original cause of wound was Pressure Injury. The wound is located on the Left Calcaneus. The wound measures 0.2cm length x 0.2cm width x 0.1cm depth; 0.031cm^2 area and 0.003cm^3 volume. The wound is limited to skin breakdown. There is no tunneling or undermining noted. There is a large amount of serosanguineous drainage noted. The wound margin is thickened. There is medium (34-66%) red granulation within the wound bed. There is a medium (34-66%)  amount of necrotic tissue within the wound bed including Eschar and Adherent Slough. The periwound skin appearance exhibited: Maceration, Moist. Periwound temperature was noted as No Abnormality. The periwound has tenderness on palpation. Wound #2 status is Open. Original cause of wound was Pressure Injury. The wound is located on the Right,Lateral Calcaneus. The wound measures 1cm length x 2cm width x 0.3cm depth; 1.571cm^2 area and 0.471cm^3 volume. There is bone, muscle, and tendon exposed. There is no tunneling or undermining noted. There is a large amount of serosanguineous drainage noted. The wound margin is thickened. There is large  (67-100%) red granulation within the wound bed. There is a small (1-33%) amount of necrotic tissue within the wound bed including Adherent Slough. The periwound skin appearance exhibited: Localized Edema, Maceration, Moist, Erythema. The surrounding wound skin color is noted with erythema which is circumferential. Periwound temperature was noted as No Abnormality. The periwound has tenderness on palpation. Assessment Active Problems ICD-10 E11.621 - Type 2 diabetes mellitus with foot ulcer E11.42 - Type 2 diabetes mellitus with diabetic polyneuropathy L89.614 - Pressure ulcer of right heel, stage 4 L89.623 - Pressure ulcer of left heel, stage 3 I35.0 - Nonrheumatic aortic (valve) stenosis [name], [name] ([id number]) Procedures Wound #1 Wound #1 is a Pressure Ulcer located on the Left Calcaneus . There was a Skin/Subcutaneous Tissue Debridement ([id number]) debridement with total area of 0.04 sq cm performed by Maxwell Caul, MD. with the following instrument(s): Blade and Forceps to remove Viable and Non-Viable tissue/material including Exudate, Fibrin/Slough, and Subcutaneous after achieving pain control using Lidocaine 4% Topical Solution. A time out was conducted at 15:26, prior to the start of the procedure. A Minimum amount of bleeding was controlled with Pressure. The procedure was tolerated well with a pain level of 0 throughout and a pain level of 0 following the procedure. Post Debridement Measurements: 0.9cm length x 0.3cm width x 0.2cm depth; 0.042cm^3 volume. Post debridement Stage noted as Category/Stage II. Character of Wound/Ulcer Post Debridement requires further debridement. Severity of Tissue Post Debridement is: Limited to breakdown of skin. Post procedure Diagnosis Wound #1: Same as Pre-Procedure Wound #2 Wound #2 is a Pressure Ulcer located on the Right,Lateral Calcaneus . There was a Skin/Subcutaneous Tissue Debridement ([id number]) debridement with total  area of 2 sq cm performed by Maxwell Caul, MD. with the following instrument(s): Blade and Forceps to remove Viable and Non-Viable tissue/material including Exudate, Fibrin/Slough, and Subcutaneous after achieving pain control using Lidocaine 4% Topical Solution. A time out was conducted at 15:26, prior to the start of the procedure. A Minimum amount of bleeding was controlled with Pressure. The procedure was tolerated well with a pain level of 0 throughout and a pain level of 0 following the procedure. Post Debridement Measurements: 1cm length x 2cm width x 0.3cm depth; 0.471cm^3 volume. Post debridement Stage noted as Category/Stage IV. Character of Wound/Ulcer Post Debridement requires further debridement. Severity of Tissue Post Debridement is: Limited to breakdown of skin. Post procedure Diagnosis Wound #2: Same as Pre-Procedure Plan Wound Cleansing: Wound #1 Left Calcaneus: Clean wound with Normal Saline. Wound #2 Right,Lateral Calcaneus: Clean wound with Normal Saline. Anesthetic: Wound #1 Left Calcaneus: Topical Lidocaine 4% cream applied to wound bed prior to debridement - for clinic use only Wound #2 Right,Lateral Calcaneus: Wain, Lian L. ([id number]) Topical Lidocaine 4% cream applied to wound bed prior to debridement - for clinic use only Primary Wound Dressing: Wound #1 Left Calcaneus: Aquacel Ag Wound #2 Right,Lateral Calcaneus: Hydrafera Blue Secondary Dressing:  Wound #1 Left Calcaneus: ABD and Kerlix/Conform Wound #2 Right,Lateral Calcaneus: ABD and Kerlix/Conform Dressing Change Frequency: Wound #1 Left Calcaneus: Change dressing every other day. Wound #2 Right,Lateral Calcaneus: Change dressing every other day. Follow-up Appointments: Wound #1 Left Calcaneus: Return Appointment in 1 week. - for nurse visit Wound #2 Right,Lateral Calcaneus: Return Appointment in 1 week. - for nurse visit Off-Loading: Wound #1 Left Calcaneus: Heel suspension boot  to: - please float the heels when pt is sitting with feet up or when pt is lying in bed. Turn and reposition every 2 hours Wound #2 Right,Lateral Calcaneus: Heel suspension boot to: - please float the heels when pt is sitting with feet up or when pt is lying in bed. Turn and reposition every 2 hours Additional Orders / Instructions: Wound #1 Left Calcaneus: Increase protein intake. Other: - Physical Therapy - Patient may stand for transfers and stand for therapy and walk [place] distances only Wound #2 Right,Lateral Calcaneus: Increase protein intake. Other: - Physical Therapy - Patient may stand for transfers and stand for therapy and walk [place] distances only Medications-please add to medication list.: Wound #1 Left Calcaneus: Other: - Vitamin C, Zinc, Multivitamin Wound #2 Right,Lateral Calcaneus: Other: - Vitamin C, Zinc, Multivitamin #1 continue with Hydrofera Blue to the right heel Murcia, Ebenezer L. ([id number]) #2 changed to silver alginate to the left heel Electronic Signature(s) Signed: 08/11/2016 4:33:17 PM By: Baltazar Najjar MD Entered By: Baltazar Najjar on 08/11/2016 15:43:33 Wayment, Duran L. ([id number]) -------------------------------------------------------------------------------- SuperBill Details Patient Name: Innocent, Cordon L. Date of Service: 08/11/2016 Medical Record Patient Account Number: [id number] Number: Treating RN: Phillis Haggis 13-Oct-1946 (70 y.o. 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, stage 4 L89.623 Pressure ulcer of left heel, stage 3 I35.0 Nonrheumatic aortic (valve) stenosis Facility  Procedures CPT4 Code: [id number] Description: 11042 - DEB SUBQ TISSUE 20 SQ CM/< ICD-10 Description Diagnosis E11.621 Type 2 diabetes mellitus with foot ulcer Modifier: Quantity: 1 Physician Procedures CPT4 Code: [id number] Description: 11042 - WC PHYS SUBQ TISS 20 SQ CM ICD-10 Description Diagnosis E11.621 Type 2 diabetes mellitus with foot ulcer Modifier: Quantity: 1 Electronic Signature(s) Signed: 08/11/2016 4:33:17 PM By: Baltazar Najjar MD Entered By: Baltazar Najjar on 08/11/2016 15:43:53

## 2016-08-13 NOTE — Progress Notes (Signed)
[name], [name] ([id number]) Visit Report for 08/11/2016 Arrival Information Details Patient Name: [name], [name] Date of Service: 08/11/2016 3:00 PM Medical Record Number: [id number] Patient Account Number: [id number] Date of Birth/Sex: 12/05/1946 [age] y.o. Male) Treating RN: Phillis HaggisPinkerton, Debi Primary Care Physician: Marga MelnickHOPPER, WILLIAM Other Clinician: Referring Physician: Marga MelnickHOPPER, WILLIAM Treating Physician/Extender: Altamese CarolinaOBSON, Seydina G Weeks in [name]: 15 Visit Information History Since Last Visit All ordered tests and consults were completed: No Patient Arrived: Wheel Chair Added or deleted any medications: No Arrival Time: 15:13 Any new allergies or adverse reactions: No Accompanied By: self Had a fall or experienced change in No Transfer Assistance: EasyPivot Patient activities of daily living that may affect Lift risk of falls: Patient Identification Verified: Yes Signs or symptoms of abuse/neglect since last No Secondary Verification Process Yes visito Completed: Hospitalized since last visit: No Patient Requires Transmission- No Pain Present Now: No Based Precautions: Patient Has Alerts: Yes Patient Alerts: DM II bil legs non- compressible Electronic Signature(s) Signed: 08/12/2016 4:42:28 PM By: Alejandro MullingPinkerton, Debra Entered By: Alejandro MullingPinkerton, Debra on 08/11/2016 15:14:13 [name], [name] ([id number]) -------------------------------------------------------------------------------- Encounter Discharge Information Details Patient Name: [name], [name] Date of Service: 08/11/2016 3:00 PM Medical Record Number: [id number] Patient Account Number: [id number] Date of Birth/Sex: 12/05/1946 [age] y.o. Male) Treating RN: Phillis HaggisPinkerton, Debi Primary Care Physician: Marga MelnickHOPPER, WILLIAM Other Clinician: Referring Physician: Marga MelnickHOPPER, WILLIAM Treating Physician/Extender: Altamese CarolinaOBSON, Hayze G Weeks in [name]: 15 Encounter Discharge Information Items Schedule Follow-up Appointment: No Medication  Reconciliation completed No and provided to Patient/Care Eevee Borbon: Provided on Clinical Summary of Care: 08/11/2016 Form Type Recipient Paper Patient MS Electronic Signature(s) Signed: 08/11/2016 3:49:05 PM By: Gwenlyn PerkingMoore, Shelia Entered By: Gwenlyn PerkingMoore, Shelia on 08/11/2016 15:49:04 [name], [name] ([id number]) -------------------------------------------------------------------------------- Lower Extremity Assessment Details Patient Name: [name], [name] Date of Service: 08/11/2016 3:00 PM Medical Record Number: [id number] Patient Account Number: [id number] Date of Birth/Sex: 12/05/1946 [age] y.o. Male) Treating RN: Phillis HaggisPinkerton, Debi Primary Care Physician: Marga MelnickHOPPER, WILLIAM Other Clinician: Referring Physician: Marga MelnickHOPPER, WILLIAM Treating Physician/Extender: Altamese CarolinaOBSON, Aslan G Weeks in [name]: 15 Vascular Assessment Pulses: Posterior Tibial Dorsalis Pedis Palpable: [Left:Yes] [Right:Yes] Extremity colors, hair growth, and conditions: Extremity Color: [Left:Normal] [Right:Normal] Temperature of Extremity: [Left:Warm] [Right:Warm] Capillary Refill: [Left:< 3 seconds] [Right:< 3 seconds] Electronic Signature(s) Signed: 08/12/2016 4:42:28 PM By: Alejandro MullingPinkerton, Debra Entered By: Alejandro MullingPinkerton, Debra on 08/11/2016 15:16:40 [name], [name] ([id number]) -------------------------------------------------------------------------------- Multi Wound Chart Details Patient Name: [name], [name] Date of Service: 08/11/2016 3:00 PM Medical Record Number: [id number] Patient Account Number: [id number] Date of Birth/Sex: 12/05/1946 [age] y.o. Male) Treating RN: Phillis HaggisPinkerton, Debi Primary Care Physician: Marga MelnickHOPPER, WILLIAM Other Clinician: Referring Physician: Marga MelnickHOPPER, WILLIAM Treating Physician/Extender: Maxwell CaulOBSON, Adolf G Weeks in [name]: 15 Vital Signs Height(in): 65 Pulse(bpm): 81 Weight(lbs): Blood Pressure 125/60 (mmHg): Body Mass Index(BMI): Temperature(F): 97.8 Respiratory  Rate 18 (breaths/min): Photos: [1:No Photos] [2:No Photos] [N/A:N/A] Wound Location: [1:Left Calcaneus] [2:Right Calcaneus - Lateral] [N/A:N/A] Wounding Event: [1:Pressure Injury] [2:Pressure Injury] [N/A:N/A] Primary Etiology: [1:Pressure Ulcer] [2:Pressure Ulcer] [N/A:N/A] Secondary Etiology: [1:Diabetic Wound/Ulcer of the Lower Extremity] [2:Diabetic Wound/Ulcer of the Lower Extremity] [N/A:N/A] Comorbid History: [1:Anemia, Hypertension, Type II Diabetes] [2:Anemia, Hypertension, Type II Diabetes] [N/A:N/A] Date Acquired: [1:12/11/2015] [2:12/11/2015] [N/A:N/A] Weeks of [name]: [1:15] [2:15] [N/A:N/A] Wound Status: [1:Open] [2:Open] [N/A:N/A] Measurements L x W x D 1x2x0.3 [2:0.2x0.2x0.1] [N/A:N/A] (cm) Area (cm) : [1:1.571] [2:0.031] [N/A:N/A] Volume (cm) : [1:0.471] [2:0.003] [N/A:N/A] % Reduction in Area: [1:73.60%] [2:99.70%] [N/A:N/A] % Reduction in Volume: 60.50% [2:99.90%] [N/A:N/A] Classification: [1:Category/Stage II] [2:Category/Stage IV] [N/A:N/A] HBO Classification: [1:Grade 1] [2:Grade 1] [N/A:N/A] Exudate Amount: [  1:Large] [2:Large] [N/A:N/A] Exudate Type: [1:Serosanguineous] [2:Serosanguineous] [N/A:N/A] Exudate Color: [1:red, brown] [2:red, brown] [N/A:N/A] Foul Odor After [1:No] [2:Yes] [N/A:N/A] Cleansing: Odor Anticipated Due to N/A [2:No] [N/A:N/A] Product Use: Wound Margin: [1:Thickened] [2:Thickened] [N/A:N/A] Granulation Amount: [1:Medium (34-66%)] [2:Large (67-100%)] [N/A:N/A] Granulation Quality: [1:Red] [2:Red] [N/A:N/A] Necrotic Amount: Medium (34-66%) Small (1-33%) N/A Necrotic Tissue: Eschar, Adherent Slough Adherent Slough N/A Exposed Structures: Fascia: No Tendon: Yes N/A Fat: No Muscle: Yes Tendon: No Bone: Yes Muscle: No Fascia: No Joint: No Fat: No Bone: No Joint: No Limited to Skin Breakdown Epithelialization: None None N/A Periwound Skin Texture: No Abnormalities Noted Edema: Yes N/A Periwound Skin Maceration:  Yes Maceration: Yes N/A Moisture: Moist: Yes Moist: Yes Periwound Skin Color: No Abnormalities Noted Erythema: Yes N/A Erythema Location: N/A Circumferential N/A Temperature: No Abnormality No Abnormality N/A Tenderness on Yes Yes N/A Palpation: Wound Preparation: Ulcer Cleansing: Ulcer Cleansing: N/A Rinsed/Irrigated with Rinsed/Irrigated with Saline Saline Topical Anesthetic Topical Anesthetic Applied: Other: lidocaine Applied: Other: lidocaine 4% 4% [name] Notes Electronic Signature(s) Signed: 08/12/2016 4:42:28 PM By: Alejandro Mulling Entered By: Alejandro Mulling on 08/11/2016 15:27:06 Testa, Azlan Elbert [name] ([id number]) -------------------------------------------------------------------------------- Multi-Disciplinary Care Plan Details Patient Name: [name], [name] Date of Service: 08/11/2016 3:00 PM Medical Record Number: [id number] Patient Account Number: [id number] Date of Birth/Sex: Apr 01, 1946 (70 y.o. Male) Treating RN: Phillis Haggis Primary Care Physician: Marga Melnick Other Clinician: Referring Physician: Marga Melnick Treating Physician/Extender: Altamese [place] in [name]: 15 Active Inactive Abuse / Safety / Falls / Self Care Management Nursing Diagnoses: Potential for falls Goals: Patient will remain injury free Date Initiated: 04/27/2016 Goal Status: Active Interventions: Assess fall risk on admission and as needed Assess self care needs on admission and as needed Notes: Nutrition Nursing Diagnoses: Imbalanced nutrition Goals: Patient/caregiver agrees to and verbalizes understanding of need to use nutritional supplements and/or vitamins as prescribed Date Initiated: 04/27/2016 Goal Status: Active Interventions: Assess patient nutrition upon admission and as needed per policy Notes: Orientation to the Wound Care Program Nursing Diagnoses: Knowledge deficit related to the wound healing center program Goals: [name], [name]  ([id number]) Patient/caregiver will verbalize understanding of the Wound Healing Center Program Date Initiated: 04/27/2016 Goal Status: Active Interventions: Provide education on orientation to the wound center Notes: Pain, Acute or Chronic Nursing Diagnoses: Pain, acute or chronic: actual or potential Potential alteration in comfort, pain Goals: Patient will verbalize adequate pain control and receive pain control interventions during procedures as needed Date Initiated: 04/27/2016 Goal Status: Active Patient/caregiver will verbalize adequate pain control between visits Date Initiated: 04/27/2016 Goal Status: Active Interventions: Assess comfort goal upon admission Complete pain assessment as per visit requirements Notes: Pressure Nursing Diagnoses: Knowledge deficit related to causes and risk factors for pressure ulcer development Knowledge deficit related to management of pressures ulcers Goals: Patient will remain free from development of additional pressure ulcers Date Initiated: 04/27/2016 Goal Status: Active Interventions: Assess: immobility, friction, shearing, incontinence upon admission and as needed Assess offloading mechanisms upon admission and as needed Assess potential for pressure ulcer upon admission and as needed Notes: [name], [name] ([id number]) Wound/Skin Impairment Nursing Diagnoses: Impaired tissue integrity Goals: Ulcer/skin breakdown will have a volume reduction of 30% by week 4 Date Initiated: 04/27/2016 Goal Status: Active Ulcer/skin breakdown will have a volume reduction of 50% by week 8 Date Initiated: 04/27/2016 Goal Status: Active Ulcer/skin breakdown will have a volume reduction of 80% by week 12 Date Initiated: 04/27/2016 Goal Status: Active Interventions: Assess patient/caregiver ability to perform ulcer/skin care regimen upon admission and  as needed Assess ulceration(s) every visit Notes: Electronic Signature(s) Signed: 08/12/2016  4:42:28 PM By: Alejandro Mulling Entered By: Alejandro Mulling on 08/11/2016 15:27:00 [name], [name] ([id number]) -------------------------------------------------------------------------------- Pain Assessment Details Patient Name: [name], [name] Date of Service: 08/11/2016 3:00 PM Medical Record Number: [id number] Patient Account Number: [id number] Date of Birth/Sex: 03/30/1946 (70 y.o. Male) Treating RN: Phillis Haggis Primary Care Physician: Marga Melnick Other Clinician: Referring Physician: Marga Melnick Treating Physician/Extender: Altamese [place] in [name]: 15 Active Problems Location of Pain Severity and Description of Pain Patient Has Paino No Site Locations With Dressing Change: No Pain Management and Medication Current Pain Management: Electronic Signature(s) Signed: 08/12/2016 4:42:28 PM By: Alejandro Mulling Entered By: Alejandro Mulling on 08/11/2016 15:14:20 [name], [name] ([id number]) -------------------------------------------------------------------------------- Wound Assessment Details Patient Name: [name], [name] Date of Service: 08/11/2016 3:00 PM Medical Record Number: [id number] Patient Account Number: [id number] Date of Birth/Sex: 1946-03-19 (70 y.o. Male) Treating RN: Phillis Haggis Primary Care Physician: Marga Melnick Other Clinician: Referring Physician: Marga Melnick Treating Physician/Extender: Maxwell Caul Weeks in [name]: 15 Wound Status Wound Number: 1 Primary Etiology: Pressure Ulcer Wound Location: Left Calcaneus Secondary Diabetic Wound/Ulcer of the Lower Etiology: Extremity Wounding Event: Pressure Injury Wound Status: Open Date Acquired: 12/11/2015 Comorbid Anemia, Hypertension, Type II Weeks Of [name]: 15 History: Diabetes Clustered Wound: No Photos Photo Uploaded By: Alejandro Mulling on 08/12/2016 09:27:08 Wound Measurements Length: (cm) 0.2 Width: (cm) 0.2 Depth: (cm) 0.1 Area: (cm)  0.031 Volume: (cm) 0.003 % Reduction in Area: 99.5% % Reduction in Volume: 99.7% Epithelialization: None Tunneling: No Undermining: No Wound Description Classification: Category/Stage II Diabetic Severity [address]): Grade 1 Wound Margin: Thickened Exudate Amount: Large Exudate Type: Serosanguineous Exudate Color: red, brown Foul Odor After Cleansing: No Wound Bed Granulation Amount: Medium (34-66%) Exposed Structure Granulation Quality: Red Fascia Exposed: No Necrotic Amount: Medium (34-66%) Fat Layer Exposed: No [place], Brazos L. ([id number]) Necrotic Quality: Eschar, Adherent Slough Tendon Exposed: No Muscle Exposed: No Joint Exposed: No Bone Exposed: No Limited to Skin Breakdown Periwound Skin Texture Texture Color No Abnormalities Noted: No No Abnormalities Noted: No Moisture Temperature / Pain No Abnormalities Noted: No Temperature: No Abnormality Maceration: Yes Tenderness on Palpation: Yes Moist: Yes Wound Preparation Ulcer Cleansing: Rinsed/Irrigated with Saline Topical Anesthetic Applied: Other: lidocaine 4%, Electronic Signature(s) Signed: 08/12/2016 4:42:28 PM By: Alejandro Mulling Entered By: Alejandro Mulling on 08/11/2016 15:28:44 Elison, Kitai L. ([id number]) -------------------------------------------------------------------------------- Wound Assessment Details Patient Name: [name], Jorgen L. Date of Service: 08/11/2016 3:00 PM Medical Record Number: [id number] Patient Account Number: [id number] Date of Birth/Sex: July 13, 1946 (70 y.o. Male) Treating RN: Phillis Haggis Primary Care Physician: Marga Melnick Other Clinician: Referring Physician: Marga Melnick Treating Physician/Extender: Maxwell Caul Weeks in [name]: 15 Wound Status Wound Number: 2 Primary Etiology: Pressure Ulcer Wound Location: Right, Lateral Calcaneus Secondary Diabetic Wound/Ulcer of the Lower Etiology: Extremity Wounding Event: Pressure Injury Wound Status:  Open Date Acquired: 12/11/2015 Comorbid Anemia, Hypertension, Type II Weeks Of [name]: 15 History: Diabetes Clustered Wound: No Photos Photo Uploaded By: Alejandro Mulling on 08/12/2016 09:27:50 Wound Measurements Length: (cm) 1 Width: (cm) 2 Depth: (cm) 0.3 Area: (cm) 1.571 Volume: (cm) 0.471 % Reduction in Area: 82.3% % Reduction in Volume: 89.4% Epithelialization: None Tunneling: No Undermining: No Wound Description Classification: Category/Stage IV Foul Odor A Diabetic Severity ([place]): Grade 1 Due to Prod Wound Margin: Thickened Exudate Amount: Large Exudate Type: Serosanguineous Exudate Color: red, brown fter Cleansing: Yes uct Use: No Wound Bed Granulation Amount: Large (67-100%) Exposed Structure Granulation Quality: Red  Fascia Exposed: No Necrotic Amount: Small (1-33%) Fat Layer Exposed: No Catala, Reinhold L. ([id number]) Necrotic Quality: Adherent Slough Tendon Exposed: Yes Muscle Exposed: Yes Necrosis of Muscle: No Joint Exposed: No Bone Exposed: Yes Periwound Skin Texture Texture Color No Abnormalities Noted: No No Abnormalities Noted: No Localized Edema: Yes Erythema: Yes Erythema Location: Circumferential Moisture No Abnormalities Noted: No Temperature / Pain Maceration: Yes Temperature: No Abnormality Moist: Yes Tenderness on Palpation: Yes Wound Preparation Ulcer Cleansing: Rinsed/Irrigated with Saline Topical Anesthetic Applied: Other: lidocaine 4%, Electronic Signature(s) Signed: 08/12/2016 4:42:28 PM By: Alejandro Mulling Entered By: Alejandro Mulling on 08/11/2016 15:28:45 Levins, Jemiah L. ([id number]) -------------------------------------------------------------------------------- Vitals Details Patient Name: Dobkins, Daevon L. Date of Service: 08/11/2016 3:00 PM Medical Record Number: [id number] Patient Account Number: [id number] Date of Birth/Sex: 03/19/1946 (70 y.o. Male) Treating RN: Phillis Haggis Primary Care Physician:  Marga Melnick Other Clinician: Referring Physician: Marga Melnick Treating Physician/Extender: Altamese [place] in [name]: 15 Vital Signs Time Taken: 15:14 Temperature (F): 97.8 Height (in): 65 Pulse (bpm): 81 Respiratory Rate (breaths/min): 18 Blood Pressure (mmHg): 125/60 Reference Range: 80 - 120 mg / dl Electronic Signature(s) Signed: 08/12/2016 4:42:28 PM By: Alejandro Mulling Entered By: Alejandro Mulling on 08/11/2016 15:16:24

## 2016-08-18 ENCOUNTER — Encounter: Payer: Medicare Other | Attending: Internal Medicine | Admitting: Internal Medicine

## 2016-08-18 ENCOUNTER — Non-Acute Institutional Stay (SKILLED_NURSING_FACILITY): Payer: Medicare Other | Admitting: Nurse Practitioner

## 2016-08-18 ENCOUNTER — Encounter: Payer: Self-pay | Admitting: Nurse Practitioner

## 2016-08-18 DIAGNOSIS — Z794 Long term (current) use of insulin: Secondary | ICD-10-CM

## 2016-08-18 DIAGNOSIS — I10 Essential (primary) hypertension: Secondary | ICD-10-CM

## 2016-08-18 DIAGNOSIS — I350 Nonrheumatic aortic (valve) stenosis: Secondary | ICD-10-CM | POA: Insufficient documentation

## 2016-08-18 DIAGNOSIS — D61818 Other pancytopenia: Secondary | ICD-10-CM | POA: Diagnosis not present

## 2016-08-18 DIAGNOSIS — F29 Unspecified psychosis not due to a substance or known physiological condition: Secondary | ICD-10-CM

## 2016-08-18 DIAGNOSIS — K766 Portal hypertension: Secondary | ICD-10-CM | POA: Insufficient documentation

## 2016-08-18 DIAGNOSIS — L89614 Pressure ulcer of right heel, stage 4: Secondary | ICD-10-CM | POA: Diagnosis not present

## 2016-08-18 DIAGNOSIS — K7460 Unspecified cirrhosis of liver: Secondary | ICD-10-CM | POA: Diagnosis not present

## 2016-08-18 DIAGNOSIS — E11621 Type 2 diabetes mellitus with foot ulcer: Secondary | ICD-10-CM | POA: Insufficient documentation

## 2016-08-18 DIAGNOSIS — E1140 Type 2 diabetes mellitus with diabetic neuropathy, unspecified: Secondary | ICD-10-CM | POA: Diagnosis not present

## 2016-08-18 DIAGNOSIS — K7290 Hepatic failure, unspecified without coma: Secondary | ICD-10-CM | POA: Diagnosis not present

## 2016-08-18 DIAGNOSIS — IMO0002 Reserved for concepts with insufficient information to code with codable children: Secondary | ICD-10-CM

## 2016-08-18 DIAGNOSIS — G894 Chronic pain syndrome: Secondary | ICD-10-CM

## 2016-08-18 DIAGNOSIS — E1165 Type 2 diabetes mellitus with hyperglycemia: Secondary | ICD-10-CM

## 2016-08-18 DIAGNOSIS — L89623 Pressure ulcer of left heel, stage 3: Secondary | ICD-10-CM | POA: Insufficient documentation

## 2016-08-18 DIAGNOSIS — K7030 Alcoholic cirrhosis of liver without ascites: Secondary | ICD-10-CM

## 2016-08-18 DIAGNOSIS — E1142 Type 2 diabetes mellitus with diabetic polyneuropathy: Secondary | ICD-10-CM | POA: Diagnosis not present

## 2016-08-18 DIAGNOSIS — D638 Anemia in other chronic diseases classified elsewhere: Secondary | ICD-10-CM | POA: Diagnosis not present

## 2016-08-18 NOTE — Progress Notes (Signed)
Patient ID: [name], male   DOB: 1946/11/14, 70 y.o.   MRN: [id number]    Nursing Home Location:  [facility]   Place of Service: SNF (31)  PCP: Marga Melnick, MD  Allergies  Allergen Reactions  . Oxycodone Other (See Comments)    Sedation;change in mental status  . Penicillins Hives    Chief Complaint  Patient presents with  . Medical Management of Chronic Issues    Routine Visit    HPI:  Patient is a 70 y.o. male seen today at [facility] for routine follow up. Pt with a pmh of history of hypertension,diabetes, hypothyroidism, CKD, bilateral hip fractures s/p repair and depression. Pt with ongoing follow up with wound center due to nonhealing bilateral heel ulcers. Dressing changes being done at wound center weekly. recs include off loading and edema control. Also to increase protein and supplements. Pt is now being seen by psychologist weekly for therapy which staff reports have been beneficial however this week did not go due to the holiday and he has had increased behaviors. Very agitated on exam today. Following with psychiatry services as well.  Blood sugars remain elevated.  Reports 3-4 BMs a day  Review of Systems:  Review of Systems  Constitutional: Negative for activity change, appetite change and unexpected weight change.  HENT: Negative for congestion, hearing loss and sore throat.   Eyes: Negative.   Respiratory: Negative for cough and shortness of breath.   Cardiovascular: Negative for chest pain, palpitations and leg swelling.  Gastrointestinal: Negative for abdominal pain, constipation and diarrhea.  Genitourinary: Negative for difficulty urinating and urgency.  Musculoskeletal: Negative for arthralgias and myalgias.  Skin: Positive for wound. Negative for color change, pallor and rash.  Neurological: Positive for weakness. Negative for dizziness, light-headedness and headaches.  Psychiatric/Behavioral: Positive for agitation, behavioral  problems and confusion. The patient is not nervous/anxious.     Past Medical History:  Diagnosis Date  . Anemia, unspecified   . Anxiety   . Arthropathy, unspecified, site unspecified   . Chest pain   . Depression   . DMII (diabetes mellitus, type 2) (HCC)   . GERD (gastroesophageal reflux disease)   . History of colon polyps   . Hyperglycemia   . Hyperlipidemia   . Hypertension   . Hyponatremia   . IBS (irritable bowel syndrome)   . Liver problem   . Unspecified gastritis and gastroduodenitis without mention of hemorrhage    Past Surgical History:  Procedure Laterality Date  . ESOPHAGOGASTRODUODENOSCOPY N/A 04/21/2015   Procedure: ESOPHAGOGASTRODUODENOSCOPY (EGD);  Surgeon: Willis Modena, MD;  Location: [facility] ENDOSCOPY;  Service: Endoscopy;  Laterality: N/A;  . FEMUR IM NAIL Right 03/24/2015   Procedure: CLOSED REDUCTION INTERNAL FIXATION;  Surgeon: Kerrin Champagne, MD;  Location: [facility] OR;  Service: Orthopedics;  Laterality: Right;  . FRACTURE SURGERY    . GIVENS CAPSULE STUDY N/A 04/21/2015   Procedure: GIVENS CAPSULE STUDY;  Surgeon: Willis Modena, MD;  Location: [facility] ENDOSCOPY;  Service: Endoscopy;  Laterality: N/A;  . HERNIA REPAIR  1964  . INTRAMEDULLARY (IM) NAIL INTERTROCHANTERIC Left 01/07/2015   Procedure: INTRAMEDULLARY (IM) NAIL INTERTROCHANTRIC;  Surgeon: Kathryne Hitch, MD;  Location: [facility] ORS;  Service: Orthopedics;  Laterality: Left;   Social History:   reports that he has quit smoking. His smoking use included Cigarettes. He smoked 1.00 pack per day. He has never used smokeless tobacco. He reports that he does not drink alcohol or use drugs.  Family History  Problem Relation Age of Onset  . Stroke Mother   . Heart attack Father   . Heart disease Father   . Cancer Other     FH of Breast Cancer-other Relative  . Heart disease Other     Parent  . Hypertension Other     parent  . Cancer Maternal Uncle     colon    Medications: Patient's Medications  New  Prescriptions   No medications on file  Previous Medications   AMBULATORY NON FORMULARY MEDICATION    Give 120 cc of NSA MedPass three time daily.   AMINO ACIDS-PROTEIN HYDROLYS (FEEDING SUPPLEMENT, PRO-STAT SUGAR FREE 64,) LIQD    Take 30 mLs by mouth daily.   FUROSEMIDE (LASIX) 20 MG TABLET    Take 20 mg by mouth.   HYDROMORPHONE (DILAUDID) 2 MG TABLET    Take 0.5 tablets (1 mg total) by mouth every 6 (six) hours as needed for severe pain (Hold for sedation/respiratory distress).   HYDROXYZINE (ATARAX/VISTARIL) 25 MG TABLET    Take 25 mg by mouth every 8 (eight) hours as needed for itching.    INSULIN ASPART (NOVOLOG) 100 UNIT/ML INJECTION    Inject 5 Units into the skin 3 (three) times daily before meals. Use sliding scale based on CBG.   INSULIN DETEMIR (LEVEMIR FLEXPEN) 100 UNIT/ML PEN    Inject 18 Units into the skin at bedtime.    LACTULOSE (CHRONULAC) 10 GM/15ML SOLUTION    Give 80 ml by mouth twice daily.   LAMOTRIGINE (LAMICTAL) 100 MG TABLET    Take 100 mg by mouth every morning.   LAMOTRIGINE (LAMICTAL) 25 MG TABLET    Take 50 mg by mouth at bedtime.   LEVOTHYROXINE (SYNTHROID, LEVOTHROID) 25 MCG TABLET    Take 25 mcg by mouth daily.   PHENYLEPHRINE-SHARK LIVER OIL-MINERAL OIL-PETROLATUM (PREPARATION H) 0.25-3-14-71.9 % RECTAL OINTMENT    Place 1 application rectally 2 (two) times daily as needed for hemorrhoids.   QUETIAPINE (SEROQUEL) 50 MG TABLET    Take 50 mg by mouth at bedtime.   RIFAXIMIN (XIFAXAN) 550 MG TABS TABLET    Take 550 mg by mouth 2 (two) times daily.   SHARK LIVER OIL-COCOA BUTTER (PREPARATION H) 0.25-3-85.5 % SUPPOSITORY    Place 1 suppository rectally every 6 (six) hours as needed for hemorrhoids.   SPIRONOLACTONE (ALDACTONE) 50 MG TABLET    Take 50 mg by mouth 2 (two) times daily.   TAMSULOSIN (FLOMAX) 0.4 MG CAPS CAPSULE    Take 0.4 mg by mouth daily. Must be given 30 minutes after same meal daily.   TRADJENTA 5 MG TABS TABLET    Take 5 mg by mouth daily.    VITAMIN C (ASCORBIC ACID) 500 MG TABLET    Take 500 mg by mouth daily.   ZINC SULFATE 220 (50 ZN) MG CAPSULE    Take 220 mg by mouth daily.  Modified Medications   No medications on file  Discontinued Medications   No medications on file     Physical Exam: Vitals:   08/18/16 1429  BP: (!) 159/69  Pulse: 88  Resp: 20  Temp: 97.3 F (36.3 C)  Weight: 153 lb (69.4 kg)  Height: 5\' 6"  (1.676 m)    Physical Exam  Constitutional: He appears well-developed. No distress.  HENT:  Head: Normocephalic and atraumatic. Head is without contusion and without laceration.  Mouth/Throat: No oropharyngeal exudate.  Eyes: Conjunctivae and EOM are normal. Pupils are equal, round, and reactive to  light.  Neck: Normal range of motion. Neck supple.  Cardiovascular: Normal rate, regular rhythm and normal heart sounds.   Pulmonary/Chest: Effort normal and breath sounds normal.  Abdominal: Soft. Bowel sounds are normal. He exhibits no distension. There is no tenderness.  Musculoskeletal: He exhibits no edema or tenderness.  Neurological: He is alert. He has normal strength.  Skin: Skin is warm and dry. He is not diaphoretic.  unstageable pressure ulcer to left heel and right great toe.   Psychiatric: He has a normal mood and affect.    Labs reviewed: Basic Metabolic Panel:  Recent Labs  16/09/9601/25/17 04/14/16 05/26/16  NA 131* 138 138  K 4.8 4.6 3.5  BUN 25* 34* 22*  CREATININE 1.2 1.3 0.8   Liver Function Tests:  Recent Labs  04/14/16  AST 24  ALT 17  ALKPHOS 103   No results for input(s): LIPASE, AMYLASE in the last 8760 hours. No results for input(s): AMMONIA in the last 8760 hours. CBC:  Recent Labs  07/01/16 07/02/16 07/22/16  WBC 1.5 1.5 1.6  HGB 9.0* 9.0* 8.3*  HCT 29* 29* 26*  PLT 55* 55* 60*   TSH:  Recent Labs  07/02/16 07/12/16 07/15/16  TSH 2.63 2.94 2.94   A1C: Lab Results  Component Value Date   HGBA1C 8.9 05/26/2016   Lipid Panel: No results for input(s):  CHOL, HDL, LDLCALC, TRIG, CHOLHDL, LDLDIRECT in the last 8760 hours.  07/12/16- ammonia level- 87 07/27/16-Iron - 67 07/27/16- TIBC- 249 07/27/16- Ferritin- 106  Assessment/Plan 1. Uncontrolled type 2 diabetes mellitus with diabetic neuropathy, with long-term current use of insulin (HCC) Blood sugars remain elevated, no hypoglycemic episodes. Will increase levemir to 20 units qhs. To cont novolog with meals and tradjenta   2. Anemia of chronic disease Will follow up CBC  3. Chronic pain syndrome Mostly in feet due to wounds, pain controlled on current regimen   4. Psychosis, unspecified psychosis type Following with psychology and psychiatry services at this time. Will cont current medication as prescribed   5. Alcoholic cirrhosis of liver without ascites (HCC) Lactulose has been adjusted in the last month, will follow up ammonia level.   6. Essential hypertension Blood pressures variable, will cont current regimen and  monitor at this time.   Janene HarveyJessica K. Biagio BorgEubanks, AGNP  [facility] [phone](Monday-Friday 8 am - 5 pm) [phone] (after hours)

## 2016-09-01 ENCOUNTER — Ambulatory Visit: Payer: Self-pay | Admitting: Internal Medicine

## 2016-09-12 NOTE — Progress Notes (Signed)
[name], [name] ([id number]) Visit Report for 08/18/2016 Arrival Information Details Patient Name: [name], [name] Date of Service: 08/18/2016 3:00 PM Medical Record Number: [id number] Patient Account Number: [id number] Date of Birth/Sex: Aug 07, 1946 [age] y.o. Male) Treating RN: Phillis HaggisPinkerton, Debi Primary Care Physician: Marga MelnickHOPPER, WILLIAM Other Clinician: Referring Physician: Marga MelnickHOPPER, WILLIAM Treating Physician/Extender: Altamese CarolinaOBSON, Kessler G Weeks in Treatment: 16 Visit Information History Since Last Visit All ordered tests and consults were completed: No Patient Arrived: Wheel Chair Added or deleted any medications: No Arrival Time: 14:50 Any new allergies or adverse reactions: No Accompanied By: sister Had a fall or experienced change in No Transfer Assistance: None activities of daily living that may affect Patient Identification Verified: Yes risk of falls: Secondary Verification Process Yes Signs or symptoms of abuse/neglect since last No Completed: visito Patient Requires Transmission- No Hospitalized since last visit: No Based Precautions: Pain Present Now: No Patient Has Alerts: Yes Patient Alerts: DM II bil legs non- compressible Electronic Signature(s) Signed: 08/18/2016 5:27:01 PM By: Alejandro MullingPinkerton, Debra Entered By: Alejandro MullingPinkerton, Debra on 08/18/2016 14:51:48 Knoles, Marck L. ([id number]) -------------------------------------------------------------------------------- Encounter Discharge Information Details Patient Name: [name], [name] Date of Service: 08/18/2016 3:00 PM Medical Record Number: [id number] Patient Account Number: [id number] Date of Birth/Sex: Aug 07, 1946 [age] y.o. Male) Treating RN: Phillis HaggisPinkerton, Debi Primary Care Physician: Marga MelnickHOPPER, WILLIAM Other Clinician: Referring Physician: Marga MelnickHOPPER, WILLIAM Treating Physician/Extender: Altamese CarolinaOBSON, Yoshimi G Weeks in Treatment: 16 Encounter Discharge Information Items Discharge Pain Level: 0 Discharge Condition: Stable Ambulatory  Status: Wheelchair Discharge Destination: Nursing Home Transportation: Other Accompanied By: sister Schedule Follow-up Appointment: Yes Medication Reconciliation completed Yes and provided to Patient/Care Samarth Ogle: Provided on Clinical Summary of Care: 08/18/2016 Form Type Recipient Paper Patient MS Electronic Signature(s) Signed: 08/18/2016 3:34:09 PM By: Gwenlyn PerkingMoore, Shelia Entered By: Gwenlyn PerkingMoore, Shelia on 08/18/2016 15:34:09 Sanroman, Deshay L. ([id number]) -------------------------------------------------------------------------------- Lower Extremity Assessment Details Patient Name: [name], [name] Date of Service: 08/18/2016 3:00 PM Medical Record Number: [id number] Patient Account Number: [id number] Date of Birth/Sex: Aug 07, 1946 [age] y.o. Male) Treating RN: Phillis HaggisPinkerton, Debi Primary Care Physician: Marga MelnickHOPPER, WILLIAM Other Clinician: Referring Physician: Marga MelnickHOPPER, WILLIAM Treating Physician/Extender: Altamese CarolinaOBSON, Tydus G Weeks in Treatment: 16 Vascular Assessment Pulses: Posterior Tibial Dorsalis Pedis Palpable: [Left:Yes] [Right:Yes] Extremity colors, hair growth, and conditions: Extremity Color: [Left:Normal] [Right:Normal] Temperature of Extremity: [Left:Warm] [Right:Warm] Capillary Refill: [Left:< 3 seconds] [Right:< 3 seconds] Electronic Signature(s) Signed: 08/18/2016 5:27:01 PM By: Alejandro MullingPinkerton, Debra Entered By: Alejandro MullingPinkerton, Debra on 08/18/2016 15:08:46 Ottley, Sasuke L. ([id number]) -------------------------------------------------------------------------------- Multi Wound Chart Details Patient Name: [name], [name] Date of Service: 08/18/2016 3:00 PM Medical Record Number: [id number] Patient Account Number: [id number] Date of Birth/Sex: Aug 07, 1946 [age] y.o. Male) Treating RN: Phillis HaggisPinkerton, Debi Primary Care Physician: Marga MelnickHOPPER, WILLIAM Other Clinician: Referring Physician: Marga MelnickHOPPER, WILLIAM Treating Physician/Extender: Maxwell CaulOBSON, Olis G Weeks in Treatment: 16 Vital Signs Height(in):  65 Pulse(bpm): 93 Weight(lbs): Blood Pressure 122/54 (mmHg): Body Mass Index(BMI): Temperature(F): 98.0 Respiratory Rate 18 (breaths/min): Photos: [1:No Photos] [2:No Photos] [N/A:N/A] Wound Location: [1:Left Calcaneus] [2:Right Calcaneus - Lateral] [N/A:N/A] Wounding Event: [1:Pressure Injury] [2:Pressure Injury] [N/A:N/A] Primary Etiology: [1:Pressure Ulcer] [2:Pressure Ulcer] [N/A:N/A] Secondary Etiology: [1:Diabetic Wound/Ulcer of the Lower Extremity] [2:Diabetic Wound/Ulcer of the Lower Extremity] [N/A:N/A] Comorbid History: [1:Anemia, Hypertension, Type II Diabetes] [2:Anemia, Hypertension, Type II Diabetes] [N/A:N/A] Date Acquired: [1:12/11/2015] [2:12/11/2015] [N/A:N/A] Weeks of Treatment: [1:16] [2:16] [N/A:N/A] Wound Status: [1:Open] [2:Open] [N/A:N/A] Measurements L x W x D 0.1x0.1x0.1 [2:0.5x1.5x0.2] [N/A:N/A] (cm) Area (cm) : [1:0.008] [2:0.589] [N/A:N/A] Volume (cm) : [1:0.001] [2:0.118] [N/A:N/A] % Reduction in Area: [1:99.90%] [2:93.40%] [N/A:N/A] % Reduction in Volume: 99.90% [  2:97.30%] [N/A:N/A] Classification: [1:Category/Stage II] [2:Category/Stage IV] [N/A:N/A] HBO Classification: [1:Grade 1] [2:Grade 1] [N/A:N/A] Exudate Amount: [1:Large] [2:Large] [N/A:N/A] Exudate Type: [1:Serosanguineous] [2:Serosanguineous] [N/A:N/A] Exudate Color: [1:red, brown] [2:red, brown] [N/A:N/A] Foul Odor After [1:No] [2:Yes] [N/A:N/A] Cleansing: Odor Anticipated Due to N/A [2:No] [N/A:N/A] Product Use: Wound Margin: [1:Thickened] [2:Thickened] [N/A:N/A] Granulation Amount: [1:None Present (0%)] [2:Medium (34-66%)] [N/A:N/A] Granulation Quality: [1:N/A] [2:Red] [N/A:N/A] Necrotic Amount: Large (67-100%) Medium (34-66%) N/A Necrotic Tissue: Eschar, Adherent Slough Adherent Slough N/A Exposed Structures: Fascia: No Tendon: Yes N/A Fat: No Muscle: Yes Tendon: No Bone: Yes Muscle: No Fascia: No Joint: No Fat: No Bone: No Joint: No Limited to  Skin Breakdown Epithelialization: None None N/A Periwound Skin Texture: No Abnormalities Noted Edema: Yes N/A Periwound Skin Maceration: Yes Maceration: Yes N/A Moisture: Moist: Yes Moist: Yes Periwound Skin Color: No Abnormalities Noted Erythema: Yes N/A Erythema Location: N/A Circumferential N/A Temperature: No Abnormality No Abnormality N/A Tenderness on Yes Yes N/A Palpation: Wound Preparation: Ulcer Cleansing: Ulcer Cleansing: N/A Rinsed/Irrigated with Rinsed/Irrigated with Saline Saline Topical Anesthetic Topical Anesthetic Applied: Other: lidocaine Applied: Other: lidocaine 4% 4% Treatment Notes Electronic Signature(s) Signed: 08/18/2016 5:27:01 PM By: Alejandro Mulling Entered By: Alejandro Mulling on 08/18/2016 15:16:53 Kirkland, Marolyn Hammock ([id number]) -------------------------------------------------------------------------------- Multi-Disciplinary Care Plan Details Patient Name: Procell, Romell L. Date of Service: 08/18/2016 3:00 PM Medical Record Number: [id number] Patient Account Number: [id number] Date of Birth/Sex: 28-Jan-1946 (70 y.o. Male) Treating RN: Phillis Haggis Primary Care Physician: Marga Melnick Other Clinician: Referring Physician: Marga Melnick Treating Physician/Extender: Altamese [place] in Treatment: 16 Active Inactive Abuse / Safety / Falls / Self Care Management Nursing Diagnoses: Potential for falls Goals: Patient will remain injury free Date Initiated: 04/27/2016 Goal Status: Active Interventions: Assess fall risk on admission and as needed Assess self care needs on admission and as needed Notes: Nutrition Nursing Diagnoses: Imbalanced nutrition Goals: Patient/caregiver agrees to and verbalizes understanding of need to use nutritional supplements and/or vitamins as prescribed Date Initiated: 04/27/2016 Goal Status: Active Interventions: Assess patient nutrition upon admission and as needed per policy Notes: Orientation  to the Wound Care Program Nursing Diagnoses: Knowledge deficit related to the wound healing center program Goals: Rantz, Rajah Elbert Ewings ([id number]) Patient/caregiver will verbalize understanding of the Wound Healing Center Program Date Initiated: 04/27/2016 Goal Status: Active Interventions: Provide education on orientation to the wound center Notes: Pain, Acute or Chronic Nursing Diagnoses: Pain, acute or chronic: actual or potential Potential alteration in comfort, pain Goals: Patient will verbalize adequate pain control and receive pain control interventions during procedures as needed Date Initiated: 04/27/2016 Goal Status: Active Patient/caregiver will verbalize adequate pain control between visits Date Initiated: 04/27/2016 Goal Status: Active Interventions: Assess comfort goal upon admission Complete pain assessment as per visit requirements Notes: Pressure Nursing Diagnoses: Knowledge deficit related to causes and risk factors for pressure ulcer development Knowledge deficit related to management of pressures ulcers Goals: Patient will remain free from development of additional pressure ulcers Date Initiated: 04/27/2016 Goal Status: Active Interventions: Assess: immobility, friction, shearing, incontinence upon admission and as needed Assess offloading mechanisms upon admission and as needed Assess potential for pressure ulcer upon admission and as needed Notes: Barresi, Alexsis L. ([id number]) Wound/Skin Impairment Nursing Diagnoses: Impaired tissue integrity Goals: Ulcer/skin breakdown will have a volume reduction of 30% by week 4 Date Initiated: 04/27/2016 Goal Status: Active Ulcer/skin breakdown will have a volume reduction of 50% by week 8 Date Initiated: 04/27/2016 Goal Status: Active Ulcer/skin breakdown will have a volume reduction of 80% by week 12  Date Initiated: 04/27/2016 Goal Status: Active Interventions: Assess patient/caregiver ability to perform  ulcer/skin care regimen upon admission and as needed Assess ulceration(s) every visit Notes: Electronic Signature(s) Signed: 08/18/2016 5:27:01 PM By: Alejandro Mulling Entered By: Alejandro Mulling on 08/18/2016 15:16:46 Soley, Shelvy L. ([id number]) -------------------------------------------------------------------------------- Pain Assessment Details Patient Name: Munday, Aldred L. Date of Service: 08/18/2016 3:00 PM Medical Record Number: [id number] Patient Account Number: [id number] Date of Birth/Sex: 1946-05-19 (70 y.o. Male) Treating RN: Phillis Haggis Primary Care Physician: Marga Melnick Other Clinician: Referring Physician: Marga Melnick Treating Physician/Extender: Altamese [place] in Treatment: 16 Active Problems Location of Pain Severity and Description of Pain Patient Has Paino No Site Locations With Dressing Change: No Pain Management and Medication Current Pain Management: Electronic Signature(s) Signed: 08/18/2016 5:27:01 PM By: Alejandro Mulling Entered By: Alejandro Mulling on 08/18/2016 14:52:51 Fandino, Salman Elbert Ewings ([id number]) -------------------------------------------------------------------------------- Patient/Caregiver Education Details Patient Name: Hersch, Nicolas L. Date of Service: 08/18/2016 3:00 PM Medical Record Number: [id number] Patient Account Number: [id number] Date of Birth/Gender: Oct 24, 1946 (70 y.o. Male) Treating RN: Phillis Haggis Primary Care Physician: Marga Melnick Other Clinician: Referring Physician: Marga Melnick Treating Physician/Extender: Altamese [place] in Treatment: 16 Education Assessment Education Provided To: Patient Education Topics Provided Wound/Skin Impairment: Handouts: Other: change dressing as ordered Methods: Demonstration, Explain/Verbal Responses: State content correctly Electronic Signature(s) Signed: 08/18/2016 5:27:01 PM By: Alejandro Mulling Entered By: Alejandro Mulling on 08/18/2016  15:09:19 Dunagan, Jerris L. ([id number]) -------------------------------------------------------------------------------- Wound Assessment Details Patient Name: Jeffords, Praveen L. Date of Service: 08/18/2016 3:00 PM Medical Record Number: [id number] Patient Account Number: [id number] Date of Birth/Sex: [birth year] (70 y.o. 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 Wounding Event: Pressure Injury Wound Status: Open Date Acquired: 12/11/2015 Comorbid Anemia, Hypertension, Type II Weeks Of Treatment: 16 History: Diabetes Clustered Wound: No Photos Wound Measurements Length: (cm) 0.1 Width: (cm) 0.1 Depth: (cm) 0.1 Area: (cm) 0.008 Volume: (cm) 0.001 % Reduction in Area: 99.9% % Reduction in Volume: 99.9% Epithelialization: None Tunneling: No Undermining: No Wound Description Classification: Category/Stage II Diabetic Severity [address]): Grade 1 Wound Margin: Thickened Exudate Amount: Large Exudate Type: Serosanguineous Exudate Color: red, brown Foul Odor After Cleansing: No Wound Bed Granulation Amount: None Present (0%) Exposed Structure Necrotic Amount: Large (67-100%) Fascia Exposed: No Necrotic Quality: Eschar, Adherent Slough Fat Layer Exposed: No Tendon Exposed: No Copley, Mujtaba L. ([id number]) Muscle Exposed: No Joint Exposed: No Bone Exposed: No Limited to Skin Breakdown Periwound Skin Texture Texture Color No Abnormalities Noted: No No Abnormalities Noted: No Moisture Temperature / Pain No Abnormalities Noted: No Temperature: No Abnormality Maceration: Yes Tenderness on Palpation: Yes Moist: Yes Wound Preparation Ulcer Cleansing:  Rinsed/Irrigated with Saline Topical Anesthetic Applied: Other: lidocaine 4%, Treatment Notes Wound #1 (Left Calcaneus) 1. Cleansed with: Clean wound with Normal Saline 2. Anesthetic Topical Lidocaine 4% cream to wound bed prior to debridement 4. Dressing Applied: Hydrafera Blue 5. Secondary Dressing Applied ABD Pad Dry Gauze Kerlix/Conform 7. Secured with Tape Notes netting Electronic Signature(s) Signed: 08/18/2016 5:27:01 PM By: Alejandro Mulling Entered By: Alejandro Mulling on 08/18/2016 17:25:34 Gorczyca, Bryne L. ([id number]) -------------------------------------------------------------------------------- Wound Assessment Details Patient Name: Palm, Dewell L. Date of Service: 08/18/2016 3:00 PM Medical Record Number: [id number] Patient Account Number: [id number] Date of Birth/Sex: 1946/08/12 (70 y.o. Male) Treating RN: Phillis Haggis Primary Care  Physician: Marga Melnick Other Clinician: Referring Physician: Marga Melnick Treating Physician/Extender: Altamese [place] in Treatment: 16 Wound Status Wound Number: 2 Primary Etiology: Pressure Ulcer Wound Location: Right Calcaneus - Lateral Secondary Diabetic Wound/Ulcer of the Lower Etiology: Extremity Wounding Event: Pressure Injury Wound Status: Open Date Acquired: 12/11/2015 Comorbid Anemia, Hypertension, Type II Weeks Of Treatment: 16 History: Diabetes Clustered Wound: No Photos Photo Uploaded By: Alejandro Mulling on 08/18/2016 17:24:29 Wound Measurements Length: (cm) 0.5 Width: (cm) 1.5 Depth: (cm) 0.2 Area: (cm) 0.589 Volume: (cm) 0.118 % Reduction in Area: 93.4% % Reduction in Volume: 97.3% Epithelialization: None Tunneling: No Undermining: No Wound Description Classification: Category/Stage IV Foul Odor Diabetic Severity ([place]): Grade 1 Due to Pro Wound Margin: Thickened Exudate Amount: Large Exudate Type: Serosanguineous Exudate Color: red, brown After Cleansing: Yes duct Use:  No Wound Bed Granulation Amount: Medium (34-66%) Exposed Structure Granulation Quality: Red Fascia Exposed: No Necrotic Amount: Medium (34-66%) Fat Layer Exposed: No Amrhein, Davante L. ([id number]) Necrotic Quality: Adherent Slough Tendon Exposed: Yes Muscle Exposed: Yes Necrosis of Muscle: No Joint Exposed: No Bone Exposed: Yes Periwound Skin Texture Texture Color No Abnormalities Noted: No No Abnormalities Noted: No Localized Edema: Yes Erythema: Yes Erythema Location: Circumferential Moisture No Abnormalities Noted: No Temperature / Pain Maceration: Yes Temperature: No Abnormality Moist: Yes Tenderness on Palpation: Yes Wound Preparation Ulcer Cleansing: Rinsed/Irrigated with Saline Topical Anesthetic Applied: Other: lidocaine 4%, Treatment Notes Wound #2 (Right, Lateral Calcaneus) 1. Cleansed with: Clean wound with Normal Saline 2. Anesthetic Topical Lidocaine 4% cream to wound bed prior to debridement 4. Dressing Applied: Hydrafera Blue 5. Secondary Dressing Applied ABD Pad Dry Gauze Kerlix/Conform 7. Secured with Tape Notes netting Electronic Signature(s) Signed: 08/18/2016 5:27:01 PM By: Alejandro Mulling Entered By: Alejandro Mulling on 08/18/2016 14:58:19 Frane, Jasyn L. ([id number]) -------------------------------------------------------------------------------- Vitals Details Patient Name: Murillo, Martha L. Date of Service: 08/18/2016 3:00 PM Medical Record Number: [id number] Patient Account Number: [id number] Date of Birth/Sex: October 21, 1946 (70 y.o. Male) Treating RN: Phillis Haggis Primary Care Physician: Marga Melnick Other Clinician: Referring Physician: Marga Melnick Treating Physician/Extender: Altamese [place] in Treatment: 16 Vital Signs Time Taken: 14:53 Temperature (F): 98.0 Height (in): 65 Pulse (bpm): 93 Respiratory Rate (breaths/min): 18 Blood Pressure (mmHg): 122/54 Reference Range: 80 - 120 mg / dl Electronic  Signature(s) Signed: 08/18/2016 5:27:01 PM By: Alejandro Mulling Entered By: Alejandro Mulling on 08/18/2016 14:53:57

## 2016-09-12 NOTE — Progress Notes (Signed)
[name], [name] ([id number]) Visit Report for 08/18/2016 Chief Complaint Document Details Patient Name: [name], [name] Date of Service: 08/18/2016 3:00 PM Medical Record Patient Account Number: [id number] Number: Treating RN: Phillis Haggis 1946/04/27 (70 y.o. Other Clinician: Date of Birth/Sex: Male) Treating ROBSON, Takao Primary Care Physician: Marga Melnick Physician/Extender: G Referring Physician: Shellee Milo in Treatment: 16 Information Obtained from: Patient Chief Complaint Patient is here for our review of bilateral heel ulcers for the last 5-6 months Electronic Signature(s) Signed: 09-08-16 12:55:33 PM By: Baltazar Najjar MD Entered By: Baltazar Najjar on 08/18/2016 16:16:39 [name], [name] ([id number]) -------------------------------------------------------------------------------- Debridement Details Patient Name: [name], [name] Date of Service: 08/18/2016 3:00 PM Medical Record Patient Account Number: [id number] Number: Treating RN: Phillis Haggis Sep 08, 1946 (70 y.o. Other Clinician: Date of Birth/Sex: Male) Treating ROBSON, Abby Primary Care Physician: Marga Melnick Physician/Extender: G Referring Physician: Shellee Milo in Treatment: 16 Debridement Performed for Wound #1 Left Calcaneus Assessment: Performed By: Physician Maxwell Caul, MD Debridement: Debridement Pre-procedure Yes - 15:16 Verification/Time Out Taken: Start Time: 15:16 Pain Control: Lidocaine 4% Topical Solution Level: Skin/Subcutaneous Tissue Total Area Debrided (L x 0.1 (cm) x 0.1 (cm) = 0.01 (cm) W): Tissue and other Viable, Non-Viable, Exudate, Fibrin/Slough, Subcutaneous material debrided: Instrument: Curette Bleeding: Minimum Hemostasis Achieved: Pressure End Time: 15:18 Procedural Pain: 0 Post Procedural Pain: 0 Response to Treatment: Procedure was tolerated well Post Debridement Measurements of Total Wound Length:  (cm) 1 Stage: Category/Stage II Width: (cm) 0.4 Depth: (cm) 0.2 Volume: (cm) 0.063 Character of Wound/Ulcer Post Stable Debridement: Severity of Tissue Post Limited to breakdown of skin Debridement: Post Procedure Diagnosis Same as Pre-procedure Electronic Signature(s) Signed: 08/18/2016 5:27:01 PM By: Samul Dada ([id number]) Signed: 09/08/2016 12:55:33 PM By: Baltazar Najjar MD Entered By: Baltazar Najjar on 08/18/2016 16:16:16 [name], [name] ([id number]) -------------------------------------------------------------------------------- HPI Details Patient Name: [name], [name] Date of Service: 08/18/2016 3:00 PM Medical Record Patient Account Number: [id number] Number: Treating RN: Phillis Haggis 10-07-1946 (70 y.o. Other Clinician: Date of Birth/Sex: Male) Treating ROBSON, Kailash Primary Care Physician: Marga Melnick Physician/Extender: G Referring Physician: Shellee Milo in Treatment: 16 History of Present Illness HPI Description: 04/27/16; this is a 70 year old man who is a resident of [facility] in [place]. He is accompanied by a family friend today. She tells me that he has had problems with pressure ulcers since December 2016 largely at a time where he apparently had diabetic hypoglycemic episodes. Apparently these became so severe that he had to be transferred to [facility] for operative debridement apparently bilaterally. I don't have any records from [facility] but per his friend they did not do imaging studies bone cultures arterial studies etc. Apparently the area just applying wet to dry dressings to these. The patient is a diabetic on insulin. His arterial studies here were noncompressible although his pulses could be dopplered. The patient has a lot of other medical issues including cirrhosis of the liver with portal hypertension, chronic pancytopenia, chronic presumably hepatic  encephalopathy, moderate to severe aortic stenosis an echocardiogram in April 2016. At one point I note in his notes he was on hospice I'm not really sure whether that is true currently or not. 05/04/16; x-ray of the right heel done in his skilled facility was negative for significant bone pathology. Arterial studies also done in the facility revealed noncompressible vessels therefore no ABI could be calculated. His arterial ultrasound showed low velocities scattered atherosclerotic disease negative for focal stenosis but  monophasic waveforms bilaterally. The overall impression was "mild peripheral arterial disease" 05/11/16; culture last week of the right heel grew MRSA and group B strep. We started doxycycline today. 05/19/16; I started the patient on doxycycline 8 days ago. He has not tolerated this well secondary to gastric irritation. He is also complaining of increasing pain in the right heel. 05/26/16; the patient is completing his Zyvox that I gave him last week. Culture grew heavy group B strep and a few Escherichia coli. Surprisingly did not culture any further MRSA. Apligraf #1 06/09/16 both heal wounds are here for review today. Apligraf #2 06/23/16: pt returns today for ongoing evaluation and management of bilateral calcaneal wounds. Apligraf #3 07/07/16; patient returns today for bilateral calcaneal wounds area left heel is considerably better right is not infected stagnant. Apligraf #4 07/21/2016 -- He is here for Apligraf #5 08/04/16; the patient has a vertical area on the Achilles aspect of his left heel and a area on the right lateral heel. Both of these looks a lot better after the 5 Apligraf applications. There is no evidence of infection. 08/11/16; right heel lateral aspect the. This continues to close although the skin is going down into the wound. There will be a divot here at the end on the left heel there was a considerable amount of eschar which I removed. There is still a small  open area 08/18/16; lateral aspect of the right heel continues to do very well. Left heel required debridement over the base of this is superficial and I'm hopeful we will be able to close this off next week. Using [facility] Electronic Signature(s) Signed: 08-25-2016 12:55:33 PM By: Baltazar Najjar MD Entered By: Baltazar Najjar on 08/18/2016 16:18:21 [name], [name] ([id number]) [name], [name] Elbert Ewings ([id number]) -------------------------------------------------------------------------------- Physical Exam Details Patient Name: [name], [name] Date of Service: 08/18/2016 3:00 PM Medical Record Patient Account Number: [id number] Number: Treating RN: Phillis Haggis [birth year] (70 y.o. Other Clinician: Date of Birth/Sex: Male) Treating ROBSON, Giovany Primary Care Physician: Marga Melnick Physician/Extender: G Referring Physician: Shellee Milo in Treatment: 16 Constitutional Sitting or standing Blood Pressure is within target range for patient.. Pulse regular and within target range for patient.Marland Kitchen Respirations regular, non-labored and within target range.. Temperature is normal and within the target range for the patient.. Patient's appearance is neat and clean. Appears in no acute distress. Well nourished and well developed.. Notes Wound exam; right heel has a healthy base that appears to be closing in with advancing epithelialization left heel wound debrided of surface slough nonviable tissue to reveal a small open area however this also appears to be healthy Electronic Signature(s) Signed: 08-25-2016 12:55:33 PM By: Baltazar Najjar MD Entered By: Baltazar Najjar on 08/18/2016 16:19:23 Norton, [name] ([id number]) -------------------------------------------------------------------------------- Physician Orders Details Patient Name: Hilmer, Aero L. Date of Service: 08/18/2016 3:00 PM Medical Record Patient Account Number: [id number] Number: Treating  RN: Phillis Haggis Aug 07, 1946 (70 y.o. Other Clinician: Date of Birth/Sex: Male) Treating ROBSON, Datron Primary Care Physician: Marga Melnick Physician/Extender: G Referring Physician: Shellee Milo in Treatment: 16 Verbal / Phone Orders: Yes Clinician: Pinkerton, Debi Read Back and Verified: Yes Diagnosis Coding Wound Cleansing Wound #1 Left Calcaneus o Clean wound with Normal Saline. Wound #2 Right,Lateral Calcaneus o Clean wound with Normal Saline. Anesthetic Wound #1 Left Calcaneus o Topical Lidocaine 4% cream applied to wound bed prior to debridement - for clinic use only Wound #2 Right,Lateral Calcaneus o Topical Lidocaine 4% cream applied to wound bed prior  to debridement - for clinic use only Primary Wound Dressing Wound #1 Left Calcaneus o Hydrafera Blue - place only on wound Wound #2 Right,Lateral Calcaneus o Hydrafera Blue - place only on wound Secondary Dressing Wound #1 Left Calcaneus o ABD and Kerlix/Conform - stretch netting #4 and tape Wound #2 Right,Lateral Calcaneus o ABD and Kerlix/Conform - stretch netting #4 and tape Dressing Change Frequency Wound #1 Left Calcaneus o Change dressing every other day. Wound #2 Right,Lateral Calcaneus o Change dressing every other day. Rakers, Safwan L. ([id number]) Follow-up Appointments Wound #1 Left Calcaneus o Return Appointment in 1 week. - for nurse visit Wound #2 Right,Lateral Calcaneus o Return Appointment in 1 week. - for nurse visit Off-Loading Wound #1 Left Calcaneus o Heel suspension boot to: - please float the heels when pt is sitting with feet up or when pt is lying in bed. o Turn and reposition every 2 hours Wound #2 Right,Lateral Calcaneus o Heel suspension boot to: - please float the heels when pt is sitting with feet up or when pt is lying in bed. o Turn and reposition every 2 hours Additional Orders / Instructions Wound #1 Left Calcaneus o Increase  protein intake. o Other: - Physical Therapy - Patient may stand for transfers and stand for therapy and walk [place] distances only Wound #2 Right,Lateral Calcaneus o Increase protein intake. o Other: - Physical Therapy - Patient may stand for transfers and stand for therapy and walk [place] distances only Medications-please add to medication list. Wound #1 Left Calcaneus o Other: - Vitamin C, Zinc, Multivitamin Wound #2 Right,Lateral Calcaneus o Other: - Vitamin C, Zinc, Multivitamin Electronic Signature(s) Signed: 08/18/2016 5:27:01 PM By: Alejandro Mulling Signed: 2016-09-04 12:55:33 PM By: Baltazar Najjar MD Entered By: Alejandro Mulling on 08/18/2016 15:21:51 [name], [name] ([id number]) -------------------------------------------------------------------------------- Problem List Details Patient Name: Driscoll, Zakee L. Date of Service: 08/18/2016 3:00 PM Medical Record Patient Account Number: [id number] Number: Treating RN: Phillis Haggis 03-Jul-1946 (70 y.o. 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 ([id number]) -------------------------------------------------------------------------------- Progress Note Details Patient Name: Keathley, Andrell L. Date of  Service: 08/18/2016 3:00 PM Medical Record Patient Account Number: [id number] Number: Treating RN: Phillis Haggis 1946-12-10 (70 y.o. Other Clinician: Date of Birth/Sex: Male) Treating ROBSON, Briscoe Primary Care Physician: Marga Melnick Physician/Extender: G Referring Physician: Shellee Milo in Treatment: 16 Subjective Chief Complaint Information obtained from Patient Patient is here for our review of bilateral heel ulcers for the last 5-6 months History of Present Illness (HPI) 04/27/16; this is a 70 year old man who is a resident of [facility] in [place]. He is accompanied by a family friend today. She tells me that he has had problems with pressure ulcers since December 2016 largely at a time where he apparently had diabetic hypoglycemic episodes. Apparently these became so severe that he had to be transferred to [facility] for operative debridement apparently bilaterally. I don't have any records from [facility]  but per his friend they did not do imaging studies bone cultures arterial studies etc. Apparently the area just applying wet to dry dressings to these. The patient is a diabetic on insulin. His arterial studies here were noncompressible although his pulses could be dopplered. 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 At one point I note in his notes he was on hospice I'm not really sure whether that is true currently or not. 05/04/16; x-ray of the right heel done in his skilled facility was negative for significant bone pathology. Arterial studies also done in the facility revealed noncompressible vessels therefore no ABI could be calculated. His arterial ultrasound showed low velocities scattered atherosclerotic disease negative for focal  stenosis but monophasic waveforms bilaterally. The overall impression was "mild peripheral arterial disease" 05/11/16; culture last week of the right heel grew MRSA and group B strep. We started doxycycline today. 05/19/16; I started the patient on doxycycline 8 days ago. He has not tolerated this well secondary to gastric irritation. He is also complaining of increasing pain in the right heel. 05/26/16; the patient is completing his Zyvox that I gave him last week. Culture grew heavy group B strep and a few Escherichia coli. Surprisingly did not culture any further MRSA. Apligraf #1 06/09/16 both heal wounds are here for review today. Apligraf #2 06/23/16: pt returns today for ongoing evaluation and management of bilateral calcaneal wounds. Apligraf #3 07/07/16; patient returns today for bilateral calcaneal wounds area left heel is considerably better right is not infected stagnant. Apligraf #4 07/21/2016 -- He is here for Apligraf #5 08/04/16; the patient has a vertical area on the Achilles aspect of his left heel and a area on the right lateral heel. Both of these looks a lot better after the 5 Apligraf applications. There is no evidence of infection. 08/11/16; right heel lateral aspect the. This continues to close although the skin is going down into the wound. There will be a divot here at the end on the left heel there was a considerable amount of eschar which I removed. There is still a small open area 08/18/16; lateral aspect of the right heel continues to do very well. Left heel required debridement over the [name], [name] ([id number]) base of this is superficial and I'm hopeful we will be able to close this off next week. Using Hydrofera Blue Objective Constitutional Sitting or standing Blood Pressure is within target range for patient.. Pulse regular and within target range for patient.Marland Kitchen. Respirations regular, non-labored and within target range.. Temperature is normal and within the target  range for the patient.. Patient's appearance is neat and clean. Appears in no acute distress. Well nourished and well developed.. Vitals Time Taken: 2:53 PM, Height: 65 in, Temperature: 98.0 F, Pulse: 93 bpm, Respiratory Rate: 18 breaths/min, Blood Pressure: 122/54 mmHg. General Notes: Wound exam; right heel has a healthy base that appears to be closing in with advancing epithelialization left heel wound debrided of surface slough nonviable tissue to reveal a small open area however this also appears to be healthy Integumentary (Hair, Skin) Wound #1 status is Open. Original cause of wound was Pressure Injury. The wound is located on the Left Calcaneus. The wound measures 0.1cm length x 0.1cm width x 0.1cm depth; 0.008cm^2 area and 0.001cm^3 volume. The wound is limited to skin breakdown. There is no tunneling or  undermining noted. There is a large amount of serosanguineous drainage noted. The wound margin is thickened. There is no granulation within the wound bed. There is a large (67-100%) amount of necrotic tissue within the wound bed including Eschar and Adherent Slough. The periwound skin appearance exhibited: Maceration, Moist. Periwound temperature was noted as No Abnormality. The periwound has tenderness on palpation. Wound #2 status is Open. Original cause of wound was Pressure Injury. The wound is located on the Right,Lateral Calcaneus. The wound measures 0.5cm length x 1.5cm width x 0.2cm depth; 0.589cm^2 area and 0.118cm^3 volume. There is bone, muscle, and tendon exposed. There is no tunneling or undermining noted. There is a large amount of serosanguineous drainage noted. The wound margin is thickened. There is medium (34-66%) red granulation within the wound bed. There is a medium (34-66%) amount of necrotic tissue within the wound bed including Adherent Slough. The periwound skin appearance exhibited: Localized Edema, Maceration, Moist, Erythema. The surrounding wound skin color  is noted with erythema which is circumferential. Periwound temperature was noted as No Abnormality. The periwound has tenderness on palpation. Assessment Active Problems [name], [name] ([id number]) ICD-10 E11.621 - Type 2 diabetes mellitus with foot ulcer E11.42 - Type 2 diabetes mellitus with diabetic polyneuropathy L89.614 - Pressure ulcer of right heel, stage 4 L89.623 - Pressure ulcer of left heel, stage 3 I35.0 - Nonrheumatic aortic (valve) stenosis Procedures Wound #1 Wound #1 is a Pressure Ulcer located on the Left Calcaneus . There was a Skin/Subcutaneous Tissue Debridement ([id number]) debridement with total area of 0.01 sq cm performed by Maxwell Caul, MD. with the following instrument(s): Curette to remove Viable and Non-Viable tissue/material including Exudate, Fibrin/Slough, and Subcutaneous after achieving pain control using Lidocaine 4% Topical Solution. A time out was conducted at 15:16, prior to the start of the procedure. A Minimum amount of bleeding was controlled with Pressure. The procedure was tolerated well with a pain level of 0 throughout and a pain level of 0 following the procedure. Post Debridement Measurements: 1cm length x 0.4cm width x 0.2cm depth; 0.063cm^3 volume. Post debridement Stage noted as Category/Stage II. Character of Wound/Ulcer Post Debridement is stable. Severity of Tissue Post Debridement is: Limited to breakdown of skin. Post procedure Diagnosis Wound #1: Same as Pre-Procedure Plan Wound Cleansing: Wound #1 Left Calcaneus: Clean wound with Normal Saline. Wound #2 Right,Lateral Calcaneus: Clean wound with Normal Saline. Anesthetic: Wound #1 Left Calcaneus: Topical Lidocaine 4% cream applied to wound bed prior to debridement - for clinic use only Wound #2 Right,Lateral Calcaneus: Topical Lidocaine 4% cream applied to wound bed prior to debridement - for clinic use only Primary Wound Dressing: Wound #1 Left Calcaneus: Hydrafera  Blue - place only on wound Wound #2 Right,Lateral Calcaneus: Hydrafera Blue - place only on wound Secondary Dressing: [name], [name] ([id number]) Wound #1 Left Calcaneus: ABD and Kerlix/Conform - stretch netting #4 and tape Wound #2 Right,Lateral Calcaneus: ABD and Kerlix/Conform - stretch netting #4 and tape Dressing Change Frequency: Wound #1 Left Calcaneus: Change dressing every other day. Wound #2 Right,Lateral Calcaneus: Change dressing every other day. Follow-up Appointments: Wound #1 Left Calcaneus: Return Appointment in 1 week. - for nurse visit Wound #2 Right,Lateral Calcaneus: Return Appointment in 1 week. - for nurse visit Off-Loading: Wound #1 Left Calcaneus: Heel suspension boot to: - please float the heels when pt is sitting with feet up or when pt is lying in bed. Turn and reposition every 2 hours Wound #2 Right,Lateral Calcaneus: Heel suspension boot to: -  please float the heels when pt is sitting with feet up or when pt is lying in bed. Turn and reposition every 2 hours Additional Orders / Instructions: Wound #1 Left Calcaneus: Increase protein intake. Other: - Physical Therapy - Patient may stand for transfers and stand for therapy and walk [place] distances only Wound #2 Right,Lateral Calcaneus: Increase protein intake. Other: - Physical Therapy - Patient may stand for transfers and stand for therapy and walk [place] distances only Medications-please add to medication list.: Wound #1 Left Calcaneus: Other: - Vitamin C, Zinc, Multivitamin Wound #2 Right,Lateral Calcaneus: Other: - Vitamin C, Zinc, Multivitamin #1 continue Hydrofera Blue, ABD,kerlix,coban Electronic Signature(s) Signed: 09-08-16 12:55:33 PM By: Baltazar Najjar MD Entered By: Baltazar Najjar on 08/18/2016 16:21:33 Pogue, [name] L. ([id number]) -------------------------------------------------------------------------------- SuperBill Details Patient Name: Cantrelle, Arslan L. Date of Service:  08/18/2016 Medical Record Patient Account Number: [id number] Number: Treating RN: Phillis Haggis Sep 19, 1946 (70 y.o. 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, stage 4 L89.623 Pressure ulcer of left heel, stage 3 I35.0 Nonrheumatic aortic (valve) stenosis Facility Procedures CPT4 Code: [id number] Description: 11042 - DEB SUBQ TISSUE 20 SQ CM/< ICD-10 Description Diagnosis E11.621 Type 2 diabetes mellitus with foot ulcer Modifier: Quantity: 1 Physician Procedures CPT4 Code: [id number] Description: 11042 - WC PHYS SUBQ TISS 20 SQ CM ICD-10 Description Diagnosis E11.621 Type 2 diabetes mellitus with foot ulcer Modifier: Quantity: 1 Electronic Signature(s) Signed: 2016/09/08 12:55:33 PM By: Baltazar Najjar MD Entered By: Baltazar Najjar on 08/18/2016 16:22:01

## 2016-09-12 DEATH — deceased
